# Patient Record
Sex: Female | Born: 1950 | Race: Black or African American | Hispanic: No | Marital: Married | Smoking: Former smoker
Health system: Southern US, Community
[De-identification: ages and names within clinical notes are randomized; demographics above are authoritative.]

## PROBLEM LIST (undated history)

## (undated) ENCOUNTER — Ambulatory Visit: Admission: EM

## (undated) DIAGNOSIS — J449 Chronic obstructive pulmonary disease, unspecified: Secondary | ICD-10-CM

## (undated) DIAGNOSIS — K219 Gastro-esophageal reflux disease without esophagitis: Secondary | ICD-10-CM

## (undated) DIAGNOSIS — I10 Essential (primary) hypertension: Secondary | ICD-10-CM

## (undated) DIAGNOSIS — E119 Type 2 diabetes mellitus without complications: Secondary | ICD-10-CM

## (undated) HISTORY — PX: ABDOMINAL HYSTERECTOMY: SHX81

## (undated) HISTORY — PX: APPENDECTOMY: SHX54

## (undated) HISTORY — PX: FOOT SURGERY: SHX648

## (undated) HISTORY — PX: CHOLECYSTECTOMY: SHX55

---

## 2006-04-12 ENCOUNTER — Ambulatory Visit: Payer: Self-pay | Admitting: Family Medicine

## 2006-07-31 ENCOUNTER — Ambulatory Visit: Payer: Self-pay | Admitting: Gastroenterology

## 2008-04-12 ENCOUNTER — Ambulatory Visit: Payer: Self-pay | Admitting: Family Medicine

## 2008-08-14 ENCOUNTER — Emergency Department: Payer: Self-pay | Admitting: Emergency Medicine

## 2008-08-29 ENCOUNTER — Emergency Department: Payer: Self-pay | Admitting: Emergency Medicine

## 2008-09-27 ENCOUNTER — Ambulatory Visit: Payer: Self-pay | Admitting: Family Medicine

## 2008-09-28 ENCOUNTER — Emergency Department: Payer: Self-pay | Admitting: Emergency Medicine

## 2008-12-13 ENCOUNTER — Ambulatory Visit: Payer: Self-pay | Admitting: Internal Medicine

## 2009-04-27 ENCOUNTER — Ambulatory Visit: Payer: Self-pay | Admitting: Internal Medicine

## 2009-10-03 ENCOUNTER — Ambulatory Visit: Payer: Self-pay | Admitting: Internal Medicine

## 2010-08-20 ENCOUNTER — Emergency Department: Payer: Self-pay | Admitting: Emergency Medicine

## 2010-12-09 ENCOUNTER — Ambulatory Visit: Payer: Self-pay | Admitting: Internal Medicine

## 2011-08-19 ENCOUNTER — Emergency Department: Payer: Self-pay | Admitting: *Deleted

## 2011-09-14 ENCOUNTER — Ambulatory Visit: Payer: Self-pay | Admitting: Internal Medicine

## 2012-06-02 ENCOUNTER — Ambulatory Visit: Payer: Self-pay | Admitting: Internal Medicine

## 2012-08-26 ENCOUNTER — Ambulatory Visit: Payer: Self-pay | Admitting: Medical

## 2012-09-17 ENCOUNTER — Ambulatory Visit: Payer: Self-pay | Admitting: Internal Medicine

## 2012-09-17 LAB — RAPID INFLUENZA A&B ANTIGENS

## 2013-02-28 ENCOUNTER — Emergency Department: Payer: Self-pay | Admitting: Emergency Medicine

## 2014-02-04 ENCOUNTER — Ambulatory Visit: Payer: Self-pay | Admitting: Physician Assistant

## 2014-03-09 ENCOUNTER — Ambulatory Visit: Payer: Self-pay | Admitting: Physician Assistant

## 2014-03-09 LAB — RAPID STREP-A WITH REFLX: MICRO TEXT REPORT: NEGATIVE

## 2014-03-12 LAB — BETA STREP CULTURE(ARMC)

## 2014-05-30 ENCOUNTER — Inpatient Hospital Stay: Payer: Self-pay | Admitting: Internal Medicine

## 2014-05-30 LAB — TROPONIN I: Troponin-I: <0.02 ng/mL

## 2014-05-30 LAB — CBC
HCT: 47.6 % — ABNORMAL HIGH (ref 35.0–47.0)
HGB: 15 g/dL (ref 12.0–16.0)
MCH: 29.1 pg (ref 26.0–34.0)
MCHC: 31.5 g/dL — ABNORMAL LOW (ref 32.0–36.0)
MCV: 93 fL (ref 80–100)
Platelet: 200 10*3/uL (ref 150–440)
RBC: 5.14 10*6/uL (ref 3.80–5.20)
RDW: 14.8 % — AB (ref 11.5–14.5)
WBC: 5.6 10*3/uL (ref 3.6–11.0)

## 2014-05-30 LAB — COMPREHENSIVE METABOLIC PANEL
ALBUMIN: 3.7 g/dL (ref 3.4–5.0)
ALK PHOS: 103 U/L
Anion Gap: 5 — ABNORMAL LOW (ref 7–16)
BUN: 10 mg/dL (ref 7–18)
Bilirubin,Total: 0.4 mg/dL (ref 0.2–1.0)
CALCIUM: 9 mg/dL (ref 8.5–10.1)
CHLORIDE: 109 mmol/L — AB (ref 98–107)
CO2: 27 mmol/L (ref 21–32)
Creatinine: 0.66 mg/dL (ref 0.60–1.30)
EGFR (Non-African Amer.): >60
Glucose: 92 mg/dL (ref 65–99)
Osmolality: 280 (ref 275–301)
Potassium: 4 mmol/L (ref 3.5–5.1)
SGOT(AST): 28 U/L (ref 15–37)
SGPT (ALT): 34 U/L
Sodium: 141 mmol/L (ref 136–145)
Total Protein: 7.4 g/dL (ref 6.4–8.2)

## 2014-05-31 LAB — CBC WITH DIFFERENTIAL/PLATELET
BASOS ABS: 0 10*3/uL (ref 0.0–0.1)
Basophil %: 0.2 %
Eosinophil #: 0 10*3/uL (ref 0.0–0.7)
Eosinophil %: 0 %
HCT: 48.5 % — ABNORMAL HIGH (ref 35.0–47.0)
HGB: 15.4 g/dL (ref 12.0–16.0)
LYMPHS PCT: 10.2 %
Lymphocyte #: 1 10*3/uL (ref 1.0–3.6)
MCH: 29.4 pg (ref 26.0–34.0)
MCHC: 31.8 g/dL — AB (ref 32.0–36.0)
MCV: 93 fL (ref 80–100)
MONOS PCT: 3.4 %
Monocyte #: 0.3 x10 3/mm (ref 0.2–0.9)
NEUTROS PCT: 86.2 %
Neutrophil #: 8.3 10*3/uL — ABNORMAL HIGH (ref 1.4–6.5)
Platelet: 215 10*3/uL (ref 150–440)
RBC: 5.24 10*6/uL — ABNORMAL HIGH (ref 3.80–5.20)
RDW: 14.8 % — ABNORMAL HIGH (ref 11.5–14.5)
WBC: 9.6 10*3/uL (ref 3.6–11.0)

## 2014-05-31 LAB — BASIC METABOLIC PANEL
Anion Gap: 6 — ABNORMAL LOW (ref 7–16)
BUN: 18 mg/dL (ref 7–18)
CALCIUM: 9.5 mg/dL (ref 8.5–10.1)
CREATININE: 0.7 mg/dL (ref 0.60–1.30)
Chloride: 110 mmol/L — ABNORMAL HIGH (ref 98–107)
Co2: 26 mmol/L (ref 21–32)
EGFR (Non-African Amer.): >60
GLUCOSE: 121 mg/dL — AB (ref 65–99)
Osmolality: 286 (ref 275–301)
POTASSIUM: 4.5 mmol/L (ref 3.5–5.1)
Sodium: 142 mmol/L (ref 136–145)

## 2014-11-11 ENCOUNTER — Ambulatory Visit: Payer: Self-pay | Admitting: Family Medicine

## 2014-11-22 ENCOUNTER — Ambulatory Visit: Payer: Self-pay | Admitting: Registered Nurse

## 2014-12-26 NOTE — H&P (Signed)
PATIENT NAME:  Katherine Carter, Katherine Carter MR#:  [id number] DATE OF BIRTH:  May 24, 1951  DATE OF ADMISSION:  05/30/2014  ADMITTING PHYSICIAN: Enid Baasadhika Bernadean Saling, MD.   PRIMARY CARE PHYSICIAN: Duanne Limerickeanna C. Jones, MD.   CHIEF COMPLAINT: Difficulty breathing.   HISTORY OF PRESENT ILLNESS: Katherine Carter is a 64 year old African American female with past medical history significant for COPD, not on any home oxygen, comes to the hospital secondary to worsening breathing problem for 5 days now. The patient states that all of her symptoms started about 5 days ago with difficulty breathing only on exertion. She waited for a couple of days to see if it would get any better, but it started getting worse. She could not take it anymore so this morning she came to the hospital. She was barely 90% on room air her sat and was put on 2 liters oxygen at this time and had significant expiratory wheezing, so he is being admitted for COPD exacerbation. Chest x-ray does not show any evidence of any bronchitis so was not started on antibiotics. She complains of cough which is mostly dry and has been worsening over the last couple of days.   PAST MEDICAL HISTORY: COPD, not on home oxygen.   PAST SURGICAL HISTORY: Cholecystectomy.   ALLERGIES TO MEDICATIONS: PENICILLIN.   CURRENT HOME MEDICATIONS:  1.  Xopenex inhaler 4 times a day as needed.  2.  Dulera 2 puffs b.i.d.   SOCIAL HISTORY: Lives at home with her husband. Quit smoking about a year ago, used to smoke about 1/2 pack per day. No alcohol abuse. Works [employer].   FAMILY HISTORY: Significant for COPD in the family.   REVIEW OF SYSTEMS:  CONSTITUTIONAL: No fever, fatigue, or weakness.  EYES: No blurred vision, double vision, distention or glaucoma.  ENT: No tinnitus, ear pain, hearing loss, epistaxis or discharge.  RESPIRATORY: Positive for cough, wheezing. No hemoptysis. Positive for COPD.  CARDIOVASCULAR: Positive for chest pain from coughing. No orthopnea,  edema, erythema, palpitations or syncope.  GASTROINTESTINAL: No nausea, vomiting, diarrhea, abdominal pain, hematemesis, or melena.  GENITOURINARY: No dysuria, hematuria, renal calculus, frequency, or incontinence.  ENDOCRINE: No polyuria, nocturia, thyroid problems, heat or cold intolerance.  HEMATOLOGY: No anemia, easy bruising or bleeding.  SKIN: No acne, rash or lesions.  LYMPHATICS: No cervical or inguinal lymphadenopathy.  NEUROLOGIC: Cranial nerves intact. No focal motor or sensory deficits.  PSYCHOLOGICAL: The patient is awake, alert, oriented x 3.   LABORATORY DATA: Temperature 97.8 degrees Fahrenheit, pulse 78, respirations 20, blood pressure 157/91, pulse oximetry 91% on room air.  GENERAL: Well-developed, well-nourished female sitting in bed, not in any acute distress.  HEENT: Normocephalic, atraumatic. Pupils equal, round, reacting to light. Anicteric sclerae. Extraocular movements intact. Oropharynx clear without erythema, mass or exudates.  NECK: Supple. No thyromegaly, JVD or carotid bruits. No lymphadenopathy. Normal range of motion without pain.  LUNGS: Moving air bilaterally. Significant expiratory coarse wheezes heard throughout the lungs anteriorly and posteriorly. No crackles.  No use of accessory muscles for breathing.  CARDIOVASCULAR: S1, S2, regular rate and rhythm. No murmurs, rubs, or gallops.  ABDOMEN: Soft, nontender, nondistended. No hepatosplenomegaly. Normal bowel sounds.  EXTREMITIES: No pedal edema. No clubbing or cyanosis, 2+ dorsalis pedis pulses palpable bilaterally.  SKIN: No acne, rash or lesions.  LYMPHATICS: No cervical or inguinal lymphadenopathy.  NEUROLOGIC: Cranial nerves intact. No focal motor or sensory deficits.  PSYCHOLOGICAL: The patient is awake, alert, oriented x 3.   LABORATORY DATA: WBC 5.6, hemoglobin  15.3, hematocrit 47.6, platelet count 200,000.   Sodium 141, potassium 4.0, chloride 109, bicarbonate 27, BUN 10, creatinine 0.66, glucose  92, and calcium of 9.0.   ALT 34, AST 28, alkaline phosphatase 103, total bilirubin 0.4, albumin of 3.7.   Chest x-ray showing chronic interstitial changes. No acute cardiopulmonary disease.   ASSESSMENT AND PLAN: A 64 year old female with past medical history of chronic obstructive pulmonary disease, not on home oxygen, being admitted for chronic obstructive pulmonary disease exacerbation.  1.  Acute chronic obstructive pulmonary disease exacerbation. On oxygen and IV Solu-Medrol. Nebulizers and inhalers are being continued. No evidence of bronchitis or pneumonia, but has exposure at [facility], so will start on azithromycin at this time.   2.  Deep vein thrombosis prophylaxis with subcutaneous heparin.   CODE STATUS: Full code.   TIME SPENT ON ADMISSION: 50 minutes.    ____________________________ Enid Baas, MD rk:at D: 05/30/2014 12:50:09 ET T: 05/30/2014 13:37:24 ET JOB#: [id number]  cc: Enid Baas, MD, <Dictator> Duanne Limerick, MD Enid Baas MD ELECTRONICALLY SIGNED 06/04/2014 9:50

## 2014-12-26 NOTE — Discharge Summary (Signed)
PATIENT NAME:  Katherine Carter, Katherine Carter MR#:  [id number] DATE OF BIRTH:  [birth year]  For detailed note, please look at the history and physical done on admission by Dr. Nemiah CommanderKalisetti.   DIAGNOSES AT DISCHARGE: As follows:  1.  Chronic obstructive pulmonary disease exacerbation secondary to acute bronchitis.  2.  Acute bronchitis.   DIET:  The patient is being discharged on a regular diet.   ACTIVITY: As tolerated.   FOLLOWUP: With Dr. Elizabeth Sauereanna Jones in the next 1-2 weeks  DISCHARGE MEDICATIONS: Albuterol nebulizer every 6 hours as needed, Dulera 2 puffs b.i.d., Xopenex nebulizer every 4 hours as needed, Robitussin 10 mL q. 4 hours as needed, Zithromax 250 mg daily x 4 days, oxygen 2 liters nasal cannula continuously, prednisone taper starting at 60 mg down to 10 mg over the next 6 days.   PERTINENT STUDIES DONE DURING THE HOSPITAL COURSE: Are as follows: A chest x-ray done on admission showing stable mild pulmonary interstitial prominence suggesting chronic interstitial lung disease; no acute cardiopulmonary disease.   HOSPITAL COURSE: This is a 64 year old female who presented to the hospital with shortness of breath and wheezing and noted to be in COPD exacerbation.  Problem #1.  COPD exacerbation. This was likely secondary to an acute bronchitis. The patient was admitted to the hospital, started on IV steroids, around-the-clock nebulizer treatments. Also maintained on her maintenance inhalers and Zithromax. After getting aggressive therapy, the patient's clinical symptoms have improved. She has less wheezing and bronchospasm. She was  ambulated on room air and did desaturate below 88%; therefore, was arranged for home oxygen prior to discharge. She is also being discharged on oral prednisone taper and Zithromax for treatment for underlying COPD with mild bronchitis.   Problem #2:  Acute bronchitis. This was likely the cause of the patient's COPD exacerbation. The patient was treated with Zithromax. He  is currently being discharged on oral Zithromax as stated.   CODE STATUS: The patient is a full code.   TIME SPENT ON DISCHARGE: Thirty-five minutes.   ____________________________ Rolly PancakeVivek J. Cherlynn KaiserSainani, MD vjs:LT D: 05/31/2014 12:26:00 ET T: 05/31/2014 16:52:28 ET JOB#: [id number]  cc: Rolly PancakeVivek J. Cherlynn KaiserSainani, MD, <Dictator> Houston SirenVIVEK J SAINANI MD ELECTRONICALLY SIGNED 06/01/2014 15:10

## 2015-07-07 ENCOUNTER — Ambulatory Visit
Admission: RE | Admit: 2015-07-07 | Discharge: 2015-07-07 | Disposition: A | Discharge status: Home / Self Care | Payer: Self-pay | Source: Ambulatory Visit | Attending: Oncology | Admitting: Oncology

## 2015-07-07 ENCOUNTER — Encounter: Payer: Self-pay | Admitting: *Deleted

## 2015-07-07 ENCOUNTER — Ambulatory Visit: Payer: Self-pay | Attending: Oncology | Admitting: *Deleted

## 2015-07-07 VITALS — BP 148/87 | HR 77 | Temp 98.1°F | Resp 18 | Ht 62.99 in | Wt 152.8 lb

## 2015-07-07 DIAGNOSIS — Z0000 Encounter for general adult medical examination without abnormal findings: Secondary | ICD-10-CM

## 2015-07-07 NOTE — Patient Instructions (Signed)
Gave patient hand-out, Women Staying Healthy, Active and Well from [facility], with education on breast health, pap smears, heart and colon health. 

## 2015-07-07 NOTE — Progress Notes (Signed)
Subjective:     Patient ID: Katherine Carter, female   DOB: 05-Apr-1951, 64 y.o.   MRN: [id number]  HPI   Review of Systems     Objective:   Physical Exam  Pulmonary/Chest: Right breast exhibits no inverted nipple, no mass, no nipple discharge, no skin change and no tenderness. Left breast exhibits no inverted nipple, no mass, no nipple discharge, no skin change and no tenderness. Breasts are symmetrical.         Assessment:     64 year old Black female [facility] breast [facility] and mammogram only.  Clinical breast exam unremarkable.  Taught self breast awareness.  Patient has been screened for eligibility.  She does not have any insurance, Medicare or Medicaid.  She also meets financial eligibility.  Hand-out given on the Affordable Care Act.     Plan:     Screening mammogram ordered.  Will follow up per [facility] protocol.

## 2015-07-28 ENCOUNTER — Encounter: Payer: Self-pay | Admitting: *Deleted

## 2015-07-28 NOTE — Progress Notes (Signed)
Letter mailed from the [facility] to inform patient of her normal mammogram results.  Patient is to follow-up with annual screening in one year.  HSIS to Christy. 

## 2015-12-07 ENCOUNTER — Ambulatory Visit
Admission: EM | Admit: 2015-12-07 | Discharge: 2015-12-07 | Disposition: A | Discharge status: Home / Self Care | Payer: BLUE CROSS/BLUE SHIELD | Attending: Family Medicine | Admitting: Family Medicine

## 2015-12-07 ENCOUNTER — Encounter: Payer: Self-pay | Admitting: Emergency Medicine

## 2015-12-07 DIAGNOSIS — J0100 Acute maxillary sinusitis, unspecified: Secondary | ICD-10-CM

## 2015-12-07 DIAGNOSIS — J209 Acute bronchitis, unspecified: Secondary | ICD-10-CM

## 2015-12-07 DIAGNOSIS — J441 Chronic obstructive pulmonary disease with (acute) exacerbation: Secondary | ICD-10-CM

## 2015-12-07 HISTORY — DX: Chronic obstructive pulmonary disease, unspecified: J44.9

## 2015-12-07 MED ORDER — FEXOFENADINE-PSEUDOEPHED ER 180-240 MG PO TB24: 1.0000 | ORAL_TABLET | Freq: Every day | ORAL | Status: DC

## 2015-12-07 MED ORDER — IPRATROPIUM-ALBUTEROL 0.5-2.5 (3) MG/3ML IN SOLN
3.0000 mL | Freq: Once | RESPIRATORY_TRACT | Status: AC
  Administered 2015-12-07: 3 mL via RESPIRATORY_TRACT

## 2015-12-07 MED ORDER — AZITHROMYCIN 250 MG PO TABS: ORAL_TABLET | ORAL | Status: DC

## 2015-12-07 MED ORDER — PREDNISONE 10 MG (21) PO TBPK: ORAL_TABLET | ORAL | Status: DC

## 2015-12-07 MED ORDER — HYDROCOD POLST-CPM POLST ER 10-8 MG/5ML PO SUER: 5.0000 mL | Freq: Two times a day (BID) | ORAL | Status: DC | PRN

## 2015-12-07 NOTE — ED Notes (Signed)
Less wheezing heard post breathing tx.

## 2015-12-07 NOTE — Discharge Instructions (Signed)
Acute Bronchitis Bronchitis is when the airways that extend from the windpipe into the lungs get red, puffy, and painful (inflamed). Bronchitis often causes thick spit (mucus) to develop. This leads to a cough. A cough is the most common symptom of bronchitis. In acute bronchitis, the condition usually begins suddenly and goes away over time (usually in 2 weeks). Smoking, allergies, and asthma can make bronchitis worse. Repeated episodes of bronchitis may cause more lung problems. HOME CARE  Rest.  Drink enough fluids to keep your pee (urine) clear or pale yellow (unless you need to limit fluids as told by your doctor).  Only take over-the-counter or prescription medicines as told by your doctor.  Avoid smoking and secondhand smoke. These can make bronchitis worse. If you are a smoker, think about using nicotine gum or skin patches. Quitting smoking will help your lungs heal faster.  Reduce the chance of getting bronchitis again by:  Washing your hands often.  Avoiding people with cold symptoms.  Trying not to touch your hands to your mouth, nose, or eyes.  Follow up with your doctor as told. GET HELP IF: Your symptoms do not improve after 1 week of treatment. Symptoms include:  Cough.  Fever.  Coughing up thick spit.  Body aches.  Chest congestion.  Chills.  Shortness of breath.  Sore throat. GET HELP RIGHT AWAY IF:   You have an increased fever.  You have chills.  You have severe shortness of breath.  You have bloody thick spit (sputum).  You throw up (vomit) often.  You lose too much body fluid (dehydration).  You have a severe headache.  You faint. MAKE SURE YOU:   Understand these instructions.  Will watch your condition.  Will get help right away if you are not doing well or get worse.   This information is not intended to replace advice given to you by your health care provider. Make sure you discuss any questions you have with your health care  provider.   Document Released: 02/07/2008 Document Revised: 04/23/2013 Document Reviewed: 02/11/2013 Elsevier Interactive Patient Education 2016 Else[employer] Inc[employer]  Asthma, Acute Bronchospasm Acute bronchospasm caused by asthma is also referred to as an asthma attack. Bronchospasm means your air passages become narrowed. The narrowing is caused by inflammation and tightening of the muscles in the air tubes (bronchi) in your lungs. This can make it hard to breathe or cause you to wheeze and cough. CAUSES Possible triggers are:  Animal dander from the skin, hair, or feathers of animals.  Dust mites contained in house dust.  Cockroaches.  Pollen from trees or grass.  Mold.  Cigarette or tobacco smoke.  Air pollutants such as dust, household cleaners, hair sprays, aerosol sprays, paint fumes, strong chemicals, or strong odors.  Cold air or weather changes. Cold air may trigger inflammation. Winds increase molds and pollens in the air.  Strong emotions such as crying or laughing hard.  Stress.  Certain medicines such as aspirin or beta-blockers.  Sulfites in foods and drinks, such as dried fruits and wine.  Infections or inflammatory conditions, such as a flu, cold, or inflammation of the nasal membranes (rhinitis).  Gastroesophageal reflux disease (GERD). GERD is a condition where stomach acid backs up into your esophagus.  Exercise or strenuous activity. SIGNS AND SYMPTOMS   Wheezing.  Excessive coughing, particularly at night.  Chest tightness.  Shortness of breath. DIAGNOSIS  Your health care provider will ask you about your medical history and perform a physical exam. A  chest X-ray or blood testing may be performed to look for other causes of your symptoms or other conditions that may have triggered your asthma attack. TREATMENT  Treatment is aimed at reducing inflammation and opening up the airways in your lungs. Most asthma attacks are treated with inhaled  medicines. These include quick relief or rescue medicines (such as bronchodilators) and controller medicines (such as inhaled corticosteroids). These medicines are sometimes given through an inhaler or a nebulizer. Systemic steroid medicine taken by mouth or given through an IV tube also can be used to reduce the inflammation when an attack is moderate or severe. Antibiotic medicines are only used if a bacterial infection is present.  HOME CARE INSTRUCTIONS   Rest.  Drink plenty of liquids. This helps the mucus to remain thin and be easily coughed up. Only use caffeine in moderation and do not use alcohol until you have recovered from your illness.  Do not smoke. Avoid being exposed to secondhand smoke.  You play a critical role in keeping yourself in good health. Avoid exposure to things that cause you to wheeze or to have breathing problems.  Keep your medicines up-to-date and available. Carefully follow your health care provider's treatment plan.  Take your medicine exactly as prescribed.  When pollen or pollution is bad, keep windows closed and use an air conditioner or go to places with air conditioning.  Asthma requires careful medical care. See your health care provider for a follow-up as advised. If you are more than [redacted] weeks pregnant and you were prescribed any new medicines, let your obstetrician know about the visit and how you are doing. Follow up with your health care provider as directed.  After you have recovered from your asthma attack, make an appointment with your outpatient doctor to talk about ways to reduce the likelihood of future attacks. If you do not have a doctor who manages your asthma, make an appointment with a primary care doctor to discuss your asthma. SEEK IMMEDIATE MEDICAL CARE IF:   You are getting worse.  You have trouble breathing. If severe, call your local emergency services (911 in the [place]  You develop chest pain or discomfort.  You are  vomiting.  You are not able to keep fluids down.  You are coughing up yellow, green, brown, or bloody sputum.  You have a fever and your symptoms suddenly get worse.  You have trouble swallowing. MAKE SURE YOU:   Understand these instructions.  Will watch your condition.  Will get help right away if you are not doing well or get worse.   This information is not intended to replace advice given to you by your health care provider. Make sure you discuss any questions you have with your health care provider.   Document Released: 12/06/2006 Document Revised: 08/26/2013 Document Reviewed: 02/26/2013 Elsevier Interactive Patient Education 2016 Else[employer] Inc.  Chronic Obstructive Pulmonary Disease Exacerbation Chronic obstructive pulmonary disease (COPD) is a common lung problem. In COPD, the flow of air from the lungs is limited. COPD exacerbations are times that breathing gets worse and you need extra treatment. Without treatment they can be life threatening. If they happen often, your lungs can become more damaged. If your COPD gets worse, your doctor may treat you with:  Medicines.  Oxygen.  Different ways to clear your airway, such as using a mask. HOME CARE  Do not smoke.  Avoid tobacco smoke and other things that bother your lungs.  If given, take your antibiotic medicine  as told. Finish the medicine even if you start to feel better.  Only take medicines as told by your doctor.  Drink enough fluids to keep your pee (urine) clear or pale yellow (unless your doctor has told you not to).  Use a cool mist machine (vaporizer).  If you use oxygen or a machine that turns liquid medicine into a mist (nebulizer), continue to use them as told.  Keep up with shots (vaccinations) as told by your doctor.  Exercise regularly.  Eat healthy foods.  Keep all doctor visits as told. GET HELP RIGHT AWAY IF:  You are very short of breath and it gets worse.  You have trouble  talking.  You have bad chest pain.  You have blood in your spit (sputum).  You have a fever.  You keep throwing up (vomiting).  You feel weak, or you pass out (faint).  You feel confused.  You keep getting worse. MAKE SURE YOU:  Understand these instructions.  Will watch your condition.  Will get help right away if you are not doing well or get worse.   This information is not intended to replace advice given to you by your health care provider. Make sure you discuss any questions you have with your health care provider.   Document Released: 08/10/2011 Document Revised: 09/11/2014 Document Reviewed: 04/25/2013 Elsevier Interactive Patient Education 2016 Elsevier Inc.  Sinusitis, Adult Sinusitis is redness, soreness, and puffiness (inflammation) of the air pockets in the bones of your face (sinuses). The redness, soreness, and puffiness can cause air and mucus to get trapped in your sinuses. This can allow germs to grow and cause an infection.  HOME CARE   Drink enough fluids to keep your pee (urine) clear or pale yellow.  Use a humidifier in your home.  Run a hot shower to create steam in the bathroom. Sit in the bathroom with the door closed. Breathe in the steam 3-4 times a day.  Put a warm, moist washcloth on your face 3-4 times a day, or as told by your doctor.  Use salt water sprays (saline sprays) to wet the thick fluid in your nose. This can help the sinuses drain.  Only take medicine as told by your doctor. GET HELP RIGHT AWAY IF:   Your pain gets worse.  You have very bad headaches.  You are sick to your stomach (nauseous).  You throw up (vomit).  You are very sleepy (drowsy) all the time.  Your face is puffy (swollen).  Your vision changes.  You have a stiff neck.  You have trouble breathing. MAKE SURE YOU:   Understand these instructions.  Will watch your condition.  Will get help right away if you are not doing well or get worse.    This information is not intended to replace advice given to you by your health care provider. Make sure you discuss any questions you have with your health care provider.   Document Released: 02/07/2008 Document Revised: 09/11/2014 Document Reviewed: 03/26/2012 Elsevier Interactive Patient Education 2016 Elsevier Inc.  Upper Respiratory Infection, Adult Most upper respiratory infections (URIs) are caused by a virus. A URI affects the nose, throat, and upper air passages. The most common type of URI is often called "the common cold." HOME CARE   Take medicines only as told by your doctor.  Gargle warm saltwater or take cough drops to comfort your throat as told by your doctor.  Use a warm mist humidifier or inhale steam from a shower  to increase air moisture. This may make it easier to breathe.  Drink enough fluid to keep your pee (urine) clear or pale yellow.  Eat soups and other clear broths.  Have a healthy diet.  Rest as needed.  Go back to work when your fever is gone or your doctor says it is okay.  You may need to stay home longer to avoid giving your URI to others.  You can also wear a face mask and wash your hands often to prevent spread of the virus.  Use your inhaler more if you have asthma.  Do not use any tobacco products, including cigarettes, chewing tobacco, or electronic cigarettes. If you need help quitting, ask your doctor. GET HELP IF:  You are getting worse, not better.  Your symptoms are not helped by medicine.  You have chills.  You are getting more short of breath.  You have brown or red mucus.  You have yellow or brown discharge from your nose.  You have pain in your face, especially when you bend forward.  You have a fever.  You have puffy (swollen) neck glands.  You have pain while swallowing.  You have white areas in the back of your throat. GET HELP RIGHT AWAY IF:   You have very bad or constant:  Headache.  Ear pain.  Pain  in your forehead, behind your eyes, and over your cheekbones (sinus pain).  Chest pain.  You have long-lasting (chronic) lung disease and any of the following:  Wheezing.  Long-lasting cough.  Coughing up blood.  A change in your usual mucus.  You have a stiff neck.  You have changes in your:  Vision.  Hearing.  Thinking.  Mood. MAKE SURE YOU:   Understand these instructions.  Will watch your condition.  Will get help right away if you are not doing well or get worse.   This information is not intended to replace advice given to you by your health care provider. Make sure you discuss any questions you have with your health care provider.   Document Released: 02/07/2008 Document Revised: 01/05/2015 Document Reviewed: 11/26/2013 Elsevier Interactive Patient Education [employer]

## 2015-12-07 NOTE — ED Notes (Signed)
Pt presents with cough and shortness of breath since Sunday along with some headaches.

## 2015-12-07 NOTE — ED Provider Notes (Signed)
CSN: [id number]     Arrival date & time 12/07/15  1305 History   First MD Initiated Contact with Patient 12/07/15 1457    Nurses notes were reviewed. Chief Complaint  Patient presents with  . Cough  . Shortness of Breath   Patient reports last 2 days to 3 days increased shortness of breath wheezing. She also reports nasal congestion. Does have a history of COPD stasis tried again to see her regular doctor today but they were booked and recommended she come to the urgent care.. She stopped smoking about 2 years ago said recurrent exacerbation of COPD and states that she was on prednisone about a month ago. This time she is having trouble for allergies and nasal congestion as well.   Past history she's had a abdominal hysterectomy she is a former smoker and she is on Atrovent. No significant past family medical history pertinent to this visit,   (Consider location/radiation/quality/duration/timing/severity/associated sxs/prior Treatment) Patient is a 65 y.o. female presenting with cough and shortness of breath. The history is provided by the patient. No language interpreter was used.  Cough Cough characteristics:  Non-productive Severity:  Moderate Onset quality:  Sudden Progression:  Worsening Context: upper respiratory infection   Relieved by:  Cough suppressants Worsened by:  Nothing tried Associated symptoms: rhinorrhea, shortness of breath, sinus congestion and wheezing   Rhinorrhea:    Severity:  Moderate   Timing:  Constant   Progression:  Worsening Shortness of Breath Associated symptoms: cough and wheezing     Past Medical History  Diagnosis Date  . COPD (chronic obstructive pulmonary disease) [facility])    Past Surgical History  Procedure Laterality Date  . Abdominal hysterectomy     No family history on file. Social History  Substance Use Topics  . Smoking status: Former Smoker -- 0.50 packs/day for 25 years    Types: Cigarettes    Quit date: 07/06/2014  . Smokeless  tobacco: Never Used  . Alcohol Use: No   OB History    No data available     Review of Systems  HENT: Positive for rhinorrhea.   Respiratory: Positive for cough, shortness of breath and wheezing.   All other systems reviewed and are negative.   Allergies  Penicillins  Home Medications   Prior to Admission medications   Medication Sig Start Date End Date Taking? Authorizing Provider  azithromycin (ZITHROMAX Z-PAK) 250 MG tablet Take 2 tablets first day and then 1 po a day for 4 days 12/07/15   Hassan Rowan, MD  chlorpheniramine-HYDROcodone [facility]) 10-8 MG/5ML SUER Take 5 mLs by mouth every 12 (twelve) hours as needed for cough. 12/07/15   Hassan Rowan, MD  fexofenadine-pseudoephedrine (ALLEGRA-D ALLERGY & CONGESTION) 180-240 MG 24 hr tablet Take 1 tablet by mouth daily. 12/07/15   Hassan Rowan, MD  predniSONE (STERAPRED UNI-PAK 21 TAB) 10 MG (21) TBPK tablet Sig 6 tablet day 1, 5 tablets day 2, 4 tablets day 3,,3tablets day 4, 2 tablets day 5, 1 tablet day 6 take all tablets orally 12/07/15   Hassan Rowan, MD   Meds Ordered and Administered this Visit   Medications  ipratropium-albuterol (DUONEB) 0.5-2.5 (3) MG/3ML nebulizer solution 3 mL (3 mLs Nebulization Given 12/07/15 1349)    BP 102/52 mmHg  Pulse 87  Temp(Src) 97.8 F (36.6 C) (Oral)  Resp 22  Ht  (1.6 m)  Wt 156 lb (70.761 kg)  BMI 27.64 kg/m2  SpO2 95% No data found.   Physical Exam  Constitutional: She appears well-developed and well-nourished.  HENT:  Head: Normocephalic and atraumatic.  Eyes: Conjunctivae are normal. Pupils are equal, round, and reactive to light.  Neck: Normal range of motion. Neck supple. No tracheal deviation present. No thyromegaly present.  Cardiovascular: Normal rate and normal heart sounds.   Pulmonary/Chest: Effort normal and breath sounds normal. No respiratory distress. She has no wheezes.  Musculoskeletal: Normal range of motion.  Neurological: She is alert.  Skin:  Skin is warm and dry.  Psychiatric: She has a normal mood and affect.  Vitals reviewed.   ED Course  Procedures (including critical care time)  Labs Review Labs Reviewed - No data to display  Imaging Review No results found.   Visual Acuity Review  Right Eye Distance:   Left Eye Distance:   Bilateral Distance:    Right Eye Near:   Left Eye Near:    Bilateral Near:         MDM   1. COPD with acute exacerbation (HCC)   2. Bronchitis, acute, with bronchospasm   3. Acute maxillary sinusitis, recurrence not specified    Patient was given DuoNeb treatment with improvement of her breathing. We'll place on Zithromax Z-Pak Allegra-D, Tussionex 1 teaspoon twice a day and Lopressor) a course of prednisone as well. Continue using inhaler as needed. Will give a work note for today and tomorrow as well. Follow-up PCP on Friday if not better please call Thursday to get a appointment. Return her if she becomes a lot worse.  Note: This dictation was prepared with Dragon dictation along with smaller phrase technology. Any transcriptional errors that result from this process are unintentional.    Hassan RowanEugene Tyechia Allmendinger, MD 12/07/15 1531

## 2015-12-10 ENCOUNTER — Encounter: Payer: Self-pay | Admitting: Emergency Medicine

## 2015-12-10 ENCOUNTER — Inpatient Hospital Stay
Admission: EM | Admit: 2015-12-10 | Discharge: 2015-12-13 | DRG: 190 | Disposition: A | Discharge status: Home / Self Care | Payer: BLUE CROSS/BLUE SHIELD | Attending: Internal Medicine | Admitting: Internal Medicine

## 2015-12-10 ENCOUNTER — Emergency Department: Payer: BLUE CROSS/BLUE SHIELD

## 2015-12-10 DIAGNOSIS — J9600 Acute respiratory failure, unspecified whether with hypoxia or hypercapnia: Secondary | ICD-10-CM | POA: Diagnosis present

## 2015-12-10 DIAGNOSIS — J189 Pneumonia, unspecified organism: Secondary | ICD-10-CM | POA: Diagnosis present

## 2015-12-10 DIAGNOSIS — R7301 Impaired fasting glucose: Secondary | ICD-10-CM | POA: Diagnosis present

## 2015-12-10 DIAGNOSIS — Z87891 Personal history of nicotine dependence: Secondary | ICD-10-CM

## 2015-12-10 DIAGNOSIS — Z79899 Other long term (current) drug therapy: Secondary | ICD-10-CM | POA: Diagnosis not present

## 2015-12-10 DIAGNOSIS — Z9889 Other specified postprocedural states: Secondary | ICD-10-CM | POA: Diagnosis not present

## 2015-12-10 DIAGNOSIS — J440 Chronic obstructive pulmonary disease with acute lower respiratory infection: Principal | ICD-10-CM | POA: Diagnosis present

## 2015-12-10 DIAGNOSIS — Z7982 Long term (current) use of aspirin: Secondary | ICD-10-CM

## 2015-12-10 DIAGNOSIS — Z90710 Acquired absence of both cervix and uterus: Secondary | ICD-10-CM | POA: Diagnosis not present

## 2015-12-10 DIAGNOSIS — Z8249 Family history of ischemic heart disease and other diseases of the circulatory system: Secondary | ICD-10-CM

## 2015-12-10 DIAGNOSIS — J9602 Acute respiratory failure with hypercapnia: Secondary | ICD-10-CM

## 2015-12-10 DIAGNOSIS — Z880 Allergy status to penicillin: Secondary | ICD-10-CM

## 2015-12-10 DIAGNOSIS — I10 Essential (primary) hypertension: Secondary | ICD-10-CM | POA: Diagnosis present

## 2015-12-10 DIAGNOSIS — J441 Chronic obstructive pulmonary disease with (acute) exacerbation: Secondary | ICD-10-CM | POA: Diagnosis present

## 2015-12-10 DIAGNOSIS — J9601 Acute respiratory failure with hypoxia: Secondary | ICD-10-CM | POA: Diagnosis present

## 2015-12-10 LAB — CBC WITH DIFFERENTIAL/PLATELET
BASOS ABS: 0 10*3/uL (ref 0–0.1)
Basophils Relative: 1 %
EOS PCT: 1 %
Eosinophils Absolute: 0 10*3/uL (ref 0–0.7)
HEMATOCRIT: 43.9 % (ref 35.0–47.0)
Hemoglobin: 14.5 g/dL (ref 12.0–16.0)
LYMPHS ABS: 1.4 10*3/uL (ref 1.0–3.6)
LYMPHS PCT: 20 %
MCH: 30.1 pg (ref 26.0–34.0)
MCHC: 33 g/dL (ref 32.0–36.0)
MCV: 91.3 fL (ref 80.0–100.0)
MONO ABS: 0.7 10*3/uL (ref 0.2–0.9)
MONOS PCT: 9 %
NEUTROS ABS: 5.2 10*3/uL (ref 1.4–6.5)
Neutrophils Relative %: 69 %
PLATELETS: 165 10*3/uL (ref 150–440)
RBC: 4.81 MIL/uL (ref 3.80–5.20)
RDW: 14.4 % (ref 11.5–14.5)
WBC: 7.4 10*3/uL (ref 3.6–11.0)

## 2015-12-10 LAB — COMPREHENSIVE METABOLIC PANEL
ALT: 118 U/L — ABNORMAL HIGH (ref 14–54)
AST: 94 U/L — AB (ref 15–41)
Albumin: 3.7 g/dL (ref 3.5–5.0)
Alkaline Phosphatase: 89 U/L (ref 38–126)
Anion gap: 6 (ref 5–15)
BILIRUBIN TOTAL: 0.5 mg/dL (ref 0.3–1.2)
BUN: 12 mg/dL (ref 6–20)
CHLORIDE: 101 mmol/L (ref 101–111)
CO2: 29 mmol/L (ref 22–32)
Calcium: 8.6 mg/dL — ABNORMAL LOW (ref 8.9–10.3)
Creatinine, Ser: 0.55 mg/dL (ref 0.44–1.00)
Glucose, Bld: 127 mg/dL — ABNORMAL HIGH (ref 65–99)
POTASSIUM: 4.1 mmol/L (ref 3.5–5.1)
Sodium: 136 mmol/L (ref 135–145)
TOTAL PROTEIN: 7.3 g/dL (ref 6.5–8.1)

## 2015-12-10 LAB — BRAIN NATRIURETIC PEPTIDE: B NATRIURETIC PEPTIDE 5: 22 pg/mL (ref 0.0–100.0)

## 2015-12-10 LAB — HEMOGLOBIN A1C: HEMOGLOBIN A1C: 6.4 % — AB (ref 4.0–6.0)

## 2015-12-10 MED ORDER — ENOXAPARIN SODIUM 40 MG/0.4ML ~~LOC~~ SOLN: 40.0000 mg | SUBCUTANEOUS | Status: DC

## 2015-12-10 MED ORDER — LORATADINE 10 MG PO TABS
10.0000 mg | ORAL_TABLET | Freq: Every day | ORAL | Status: DC
  Administered 2015-12-10 – 2015-12-13 (×4): 10 mg via ORAL
  Filled 2015-12-10 (×4): qty 1

## 2015-12-10 MED ORDER — IPRATROPIUM-ALBUTEROL 0.5-2.5 (3) MG/3ML IN SOLN
3.0000 mL | Freq: Once | RESPIRATORY_TRACT | Status: AC
  Administered 2015-12-10: 3 mL via RESPIRATORY_TRACT

## 2015-12-10 MED ORDER — IPRATROPIUM-ALBUTEROL 0.5-2.5 (3) MG/3ML IN SOLN
3.0000 mL | Freq: Once | RESPIRATORY_TRACT | Status: AC
  Administered 2015-12-10: 3 mL via RESPIRATORY_TRACT
  Filled 2015-12-10: qty 3

## 2015-12-10 MED ORDER — ACETAMINOPHEN 650 MG RE SUPP: 650.0000 mg | Freq: Four times a day (QID) | RECTAL | Status: DC | PRN

## 2015-12-10 MED ORDER — ASPIRIN EC 81 MG PO TBEC
81.0000 mg | DELAYED_RELEASE_TABLET | Freq: Every day | ORAL | Status: DC
  Administered 2015-12-10 – 2015-12-13 (×4): 81 mg via ORAL
  Filled 2015-12-10 (×4): qty 1

## 2015-12-10 MED ORDER — ACETAMINOPHEN 500 MG PO TABS
ORAL_TABLET | ORAL | Status: AC
  Administered 2015-12-10: 1000 mg via ORAL
  Filled 2015-12-10: qty 2

## 2015-12-10 MED ORDER — ONDANSETRON HCL 4 MG PO TABS: 4.0000 mg | ORAL_TABLET | Freq: Four times a day (QID) | ORAL | Status: DC | PRN

## 2015-12-10 MED ORDER — LEVOFLOXACIN IN D5W 750 MG/150ML IV SOLN
750.0000 mg | INTRAVENOUS | Status: DC
  Administered 2015-12-10 – 2015-12-12 (×3): 750 mg via INTRAVENOUS
  Filled 2015-12-10 (×3): qty 150

## 2015-12-10 MED ORDER — HYDROCOD POLST-CPM POLST ER 10-8 MG/5ML PO SUER: 5.0000 mL | Freq: Two times a day (BID) | ORAL | Status: DC | PRN

## 2015-12-10 MED ORDER — LEVALBUTEROL HCL 1.25 MG/0.5ML IN NEBU
1.2500 mg | INHALATION_SOLUTION | Freq: Four times a day (QID) | RESPIRATORY_TRACT | Status: DC
  Administered 2015-12-11 – 2015-12-13 (×10): 1.25 mg via RESPIRATORY_TRACT
  Filled 2015-12-10 (×10): qty 0.5

## 2015-12-10 MED ORDER — LEVALBUTEROL HCL 1.25 MG/0.5ML IN NEBU
1.2500 mg | INHALATION_SOLUTION | Freq: Four times a day (QID) | RESPIRATORY_TRACT | Status: DC
  Administered 2015-12-10: 1.25 mg via RESPIRATORY_TRACT
  Filled 2015-12-10: qty 0.5

## 2015-12-10 MED ORDER — ACETAMINOPHEN 325 MG PO TABS
650.0000 mg | ORAL_TABLET | Freq: Four times a day (QID) | ORAL | Status: DC | PRN
  Administered 2015-12-10 – 2015-12-12 (×3): 650 mg via ORAL
  Filled 2015-12-10 (×3): qty 2

## 2015-12-10 MED ORDER — DICLOFENAC SODIUM 25 MG PO TBEC
75.0000 mg | DELAYED_RELEASE_TABLET | Freq: Two times a day (BID) | ORAL | Status: DC | PRN
  Administered 2015-12-10: 75 mg via ORAL
  Filled 2015-12-10: qty 3
  Filled 2015-12-10: qty 1

## 2015-12-10 MED ORDER — METFORMIN HCL ER 500 MG PO TB24
500.0000 mg | ORAL_TABLET | Freq: Every day | ORAL | Status: DC
  Administered 2015-12-11 – 2015-12-13 (×3): 500 mg via ORAL
  Filled 2015-12-10 (×4): qty 1

## 2015-12-10 MED ORDER — LISINOPRIL 10 MG PO TABS
10.0000 mg | ORAL_TABLET | Freq: Every day | ORAL | Status: DC
  Administered 2015-12-10 – 2015-12-11 (×2): 10 mg via ORAL
  Filled 2015-12-10 (×2): qty 1

## 2015-12-10 MED ORDER — METHYLPREDNISOLONE SODIUM SUCC 125 MG IJ SOLR
60.0000 mg | Freq: Four times a day (QID) | INTRAMUSCULAR | Status: DC
  Administered 2015-12-10 – 2015-12-13 (×13): 60 mg via INTRAVENOUS
  Filled 2015-12-10 (×13): qty 2

## 2015-12-10 MED ORDER — IPRATROPIUM-ALBUTEROL 0.5-2.5 (3) MG/3ML IN SOLN
RESPIRATORY_TRACT | Status: AC
  Administered 2015-12-10: 3 mL via RESPIRATORY_TRACT
  Filled 2015-12-10: qty 3

## 2015-12-10 MED ORDER — BUDESONIDE 0.25 MG/2ML IN SUSP
0.2500 mg | Freq: Two times a day (BID) | RESPIRATORY_TRACT | Status: DC
  Administered 2015-12-10 – 2015-12-13 (×7): 0.25 mg via RESPIRATORY_TRACT
  Filled 2015-12-10 (×7): qty 2

## 2015-12-10 MED ORDER — ONDANSETRON HCL 4 MG/2ML IJ SOLN: 4.0000 mg | Freq: Four times a day (QID) | INTRAMUSCULAR | Status: DC | PRN

## 2015-12-10 MED ORDER — ACETAMINOPHEN 500 MG PO TABS
1000.0000 mg | ORAL_TABLET | Freq: Once | ORAL | Status: AC
  Administered 2015-12-10: 1000 mg via ORAL

## 2015-12-10 NOTE — ED Provider Notes (Signed)
Time Seen: Approximately 1040  I have reviewed the triage notes  Chief Complaint: Shortness of Breath   History of Present Illness: Ceriah Carter is a 65 y.o. female who arrives with respiratory distress. Patient was placed on BiPAP per EMS for low saturations at home of 64% on room air. Patient does have home oxygen that she uses only on an as-needed basis. Patient apparently is been struggling with some upper respiratory symptoms and a cough and was seen by her primary physician who gave her prescription for antibiotics and some cough medication. She states that her symptoms started approximately 5 days ago and got worse over the last 48 hours. Needs 2 DuoNeb treatments and 2 g of magnesium, and 125 mg of Solu-Medrol prior to arrival. States she feels symptomatic improvement but still short of breath. She denies any chest pain, leg pain or swelling though states that she's been having some discomfort in her hips mainly with coughing.   Past Medical History  Diagnosis Date  . COPD (chronic obstructive pulmonary disease) [facility])     Patient Active Problem List   Diagnosis Date Noted  . Acute respiratory failure (HCC) 12/10/2015    Past Surgical History  Procedure Laterality Date  . Abdominal hysterectomy    . Foot surgery      Past Surgical History  Procedure Laterality Date  . Abdominal hysterectomy    . Foot surgery      Current Outpatient Rx  Name  Route  Sig  Dispense  Refill  . aspirin EC 81 MG tablet   Oral   Take 81 mg by mouth daily.         Marland Kitchen azithromycin (ZITHROMAX Z-PAK) 250 MG tablet      Take 2 tablets first day and then 1 po a day for 4 days   6 tablet   0   . chlorpheniramine-HYDROcodone (TUSSIONEX PENNKINETIC ER) 10-8 MG/5ML SUER   Oral   Take 5 mLs by mouth every 12 (twelve) hours as needed for cough.   115 mL   0   . diclofenac (VOLTAREN) 75 MG EC tablet   Oral   Take 75 mg by mouth 2 (two) times daily as needed.         .  fexofenadine-pseudoephedrine (ALLEGRA-D ALLERGY & CONGESTION) 180-240 MG 24 hr tablet   Oral   Take 1 tablet by mouth daily.   30 tablet   0   . levalbuterol (XOPENEX HFA) 45 MCG/ACT inhaler   Inhalation   Inhale 2 puffs into the lungs every 4 (four) hours as needed for wheezing.         Marland Kitchen lisinopril (PRINIVIL,ZESTRIL) 10 MG tablet   Oral   Take 10 mg by mouth daily.         . metFORMIN (GLUCOPHAGE-XR) 500 MG 24 hr tablet   Oral   Take 500 mg by mouth daily with breakfast.         . mometasone-formoterol (DULERA) 100-5 MCG/ACT AERO   Inhalation   Inhale 1 puff into the lungs 2 (two) times daily.         . predniSONE (DELTASONE) 10 MG tablet   Oral   Take 10-60 mg by mouth daily with breakfast. Taper dose 6-5-4-3-2-1           Allergies:  Penicillins  Family History: Family History  Problem Relation Age of Onset  . CAD Mother   . CAD Father     Social History: Social History  Substance Use Topics  . Smoking status: Former Smoker -- 0.50 packs/day for 25 years    Types: Cigarettes    Quit date: 07/06/2014  . Smokeless tobacco: Never Used  . Alcohol Use: No     Review of Systems:   10 point review of systems was performed and was otherwise negative:  Constitutional: NoObjective fever Eyes: No visual disturbances ENT: No sore throat, ear pain Cardiac: No chest pain Respiratory: Shortness of breath described above Abdomen: No abdominal pain, no vomiting, No diarrhea Endocrine: No weight loss, No night sweats Extremities: No peripheral edema, cyanosis Skin: No rashes, easy bruising Neurologic: No focal weakness, trouble with speech or swollowing Urologic: No dysuria, Hematuria, or urinary frequency   Physical Exam:  ED Triage Vitals  Enc Vitals Group     BP 12/10/15 1115 128/112 mmHg     Pulse Rate 12/10/15 1041 108     Resp 12/10/15 1044 20     Temp 12/10/15 1044 98.2 F (36.8 C)     Temp Source 12/10/15 1044 Oral     SpO2 12/10/15 1041  93 %     Weight 12/10/15 1041 156 lb (70.761 kg)     Height 12/10/15 1041  (1.6 m)     Head Cir --      Peak Flow --      Pain Score 12/10/15 1042 9     Pain Loc --      Pain Edu? --      Excl. in GC? --     General: Awake , Alert , and Oriented times 3; GCS 15Patient with signs of respiratory distress with speaking in brief sentences with some upper respiratory retractions. Currently on BiPAP. Head: Normal cephalic , atraumatic Eyes: Pupils equal , round, reactive to light Nose/Throat: No nasal drainage, patent upper airway without erythema or exudate.  Neck: Supple, Full range of motion, No anterior adenopathy or palpable thyroid masses Lungs: Minimal air movement at the bases with some mild and expiratory rhonchi and wheezing at the bilateral apices Heart: Regular rate, regular rhythm without murmurs , gallops , or rubs Abdomen: Soft, non tender without rebound, guarding , or rigidity; bowel sounds positive and symmetric in all 4 quadrants. No organomegaly .        Extremities: 2 plus symmetric pulses. No edema, clubbing or cyanosis Neurologic: normal ambulation, Motor symmetric without deficits, sensory intact Skin: warm, dry, no rashes   Labs:   All laboratory work was reviewed including any pertinent negatives or positives listed below:  Labs Reviewed  COMPREHENSIVE METABOLIC PANEL - Abnormal; Notable for the following:    Glucose, Bld 127 (*)    Calcium 8.6 (*)    AST 94 (*)    ALT 118 (*)    All other components within normal limits  CBC WITH DIFFERENTIAL/PLATELET  BRAIN NATRIURETIC PEPTIDE  Laboratory work was reviewed and showed no clinically significant abnormalities.   EKG:   ED ECG REPORT I, Jennye Moccasin, the attending physician, personally viewed and interpreted this ECG.  Date: 12/10/2015 EKG Time: 1046 Rate: 109 Rhythm: Sinus tachycardia QRS Axis: normal Intervals: normal ST/T Wave abnormalities: normal Conduction Disturbances: none Narrative  Interpretation: unremarkable No acute ischemic changes  Radiology:    EXAM: PORTABLE CHEST 1 VIEW  COMPARISON: 11/22/2014 and earlier.  FINDINGS: Portable AP upright view at 1051 hours. Stable lung volumes. Normal cardiac size and mediastinal contours. Calcified aortic atherosclerosis. Attenuation of upper lobe bronchovascular markings again suggestive of emphysema. No pneumothorax  chronic but mildly increased basilar predominant interstitial markings. No pleural effusion or consolidation.  IMPRESSION: Acute on chronic lung base interstitial opacity suspicious for acute infectious exacerbation in this setting. No pleural effusion. Upper lobe emphysema suspected.   I personally reviewed the radiologic studies   Critical Care: * CRITICAL CARE Performed by: Jennye MoccasinBrian S Arnette Driggs   Total critical care time: 33 minutes  Critical care time was exclusive of separately billable procedures and treating other patients.  Critical care was necessary to treat or prevent imminent or life-threatening deterioration.  Critical care was time spent personally by me on the following activities: development of treatment plan with patient and/or surrogate as well as nursing, discussions with consultants, evaluation of patient's response to treatment, examination of patient, obtaining history from patient or surrogate, ordering and performing treatments and interventions, ordering and review of laboratory studies, ordering and review of radiographic studies, pulse oximetry and re-evaluation of patient's condition. Evaluation and treatment of respiratory distress    ED Course: Patient received 2 more additional duo nebs and we are able to remove the BiPAP and place her on 6 L nasal cannula. Patient appears to have signs of community-acquired pneumonia and was started on IV antibiotic therapy with Levaquin. He otherwise appears to be hemodynamically stable and able to maintain pulse ox is of 98% at  rest on a 6 L nasal cannula. She otherwise appears to be hemodynamically stable and I don't suspect sepsis at this time.   Assessment:  Community-acquired pneumonia Acute exacerbation of chronic obstructive pulmonary disease   Final Clinical Impression:   Final diagnoses:  Chronic obstructive pulmonary disease with acute exacerbation [facility](HCC)     Plan:  Inpatient management            Jennye MoccasinBrian S Nyla Creason, MD 12/10/15 1300

## 2015-12-10 NOTE — ED Notes (Addendum)
Pt unable to use bedpan. Pt able to stand and pivit to toilet though and is now on toilet in room. No acute distress noted while standing but pt is does report increased SOB.

## 2015-12-10 NOTE — ED Notes (Signed)
[facility] EMS reports patient on her way to MD office when they pulled over and called 911 because patient was having trouble breathing. Sats on EMS arrival were 64% on room air. Patient improved to 94% on CPAP. Patient given 2 douneb treatments, 2 Grams of Mag, and 125 mg of Solumedrol

## 2015-12-10 NOTE — ED Notes (Addendum)
Pt reports she does feel like she is having increased WOB with [place]. Pt reports still having a headache at this time and also verbalized wanting something to eat. MD made aware.

## 2015-12-10 NOTE — ED Notes (Signed)
Pt placed on bedpan in attempt to urinate.

## 2015-12-10 NOTE — H&P (Addendum)
[facility] [place] [facility]   PATIENT NAME: Katherine Carter    MR#:  [id number]  DATE OF BIRTH:  11/14/1950  DATE OF ADMISSION:  12/10/2015  PRIMARY CARE PHYSICIAN: [facility]   REQUESTING/REFERRING PHYSICIAN: Dr. Lacretia Nicks  CHIEF COMPLAINT:   Chief Complaint  Patient presents with  . Shortness of Breath    HISTORY OF PRESENT ILLNESS:  Katherine Carter  is a 65 y.o. female with a known history of COPD. The patient only wears oxygen as needed at home. She states that over the last few days she has not been feeling well. She saw her doctor on Tuesday and was given Tussionex Zithromax and prednisone. She has been coughing, she has been short of breath, she's been wheezing. Family states that she possibly had a fever last night she was warm. Also has chills and sweats. She's been feeling very fatigued and no energy.  PAST MEDICAL HISTORY:   Past Medical History  Diagnosis Date  . COPD (chronic obstructive pulmonary disease) (HCC)     PAST SURGICAL HISTORY:   Past Surgical History  Procedure Laterality Date  . Abdominal hysterectomy    . Foot surgery      SOCIAL HISTORY:   Social History  Substance Use Topics  . Smoking status: Former Smoker -- 0.50 packs/day for 25 years    Types: Cigarettes    Quit date: 07/06/2014  . Smokeless tobacco: Never Used  . Alcohol Use: No    FAMILY HISTORY:   Family History  Problem Relation Age of Onset  . CAD Mother   . CAD Father     DRUG ALLERGIES:   Allergies  Allergen Reactions  . Penicillins Hives    REVIEW OF SYSTEMS:  CONSTITUTIONAL: Positive for fever, chills and sweats. Positive for fatigue.  EYES: No blurred or double vision. Wears glasses EARS, NOSE, AND THROAT: No tinnitus or ear pain. Positive for sore throat. RESPIRATORY: Positive for cough and shortness of breath. Positive for wheezing. No hemoptysis.  CARDIOVASCULAR: No chest pain, orthopnea, edema.   GASTROINTESTINAL: No nausea, vomiting, diarrhea or abdominal pain. No blood in bowel movements GENITOURINARY: No dysuria, hematuria.  ENDOCRINE: No polyuria, nocturia,  HEMATOLOGY: No anemia, easy bruising or bleeding SKIN: No rash or lesion. MUSCULOSKELETAL: Positive for hip pain, arm and leg pain.   NEUROLOGIC: No tingling, numbness, weakness.  PSYCHIATRY: No anxiety or depression.   MEDICATIONS AT HOME:   Prior to Admission medications   Medication Sig Start Date End Date Taking? Authorizing Provider  aspirin EC 81 MG tablet Take 81 mg by mouth daily.   Yes Historical Provider, MD  azithromycin (ZITHROMAX Z-PAK) 250 MG tablet Take 2 tablets first day and then 1 po a day for 4 days 12/07/15  Yes Hassan Rowan, MD  chlorpheniramine-HYDROcodone [facility] PENNKINETIC ER) 10-8 MG/5ML SUER Take 5 mLs by mouth every 12 (twelve) hours as needed for cough. 12/07/15  Yes Hassan Rowan, MD  diclofenac (VOLTAREN) 75 MG EC tablet Take 75 mg by mouth 2 (two) times daily as needed.   Yes Historical Provider, MD  fexofenadine-pseudoephedrine (ALLEGRA-D ALLERGY & CONGESTION) 180-240 MG 24 hr tablet Take 1 tablet by mouth daily. 12/07/15  Yes Hassan Rowan, MD  levalbuterol [facility] HFA) 45 MCG/ACT inhaler Inhale 2 puffs into the lungs every 4 (four) hours as needed for wheezing.   Yes Historical Provider, MD  lisinopril (PRINIVIL,ZESTRIL) 10 MG tablet Take 10 mg by mouth daily.   Yes Historical Provider, MD  metFORMIN (  GLUCOPHAGE-XR) 500 MG 24 hr tablet Take 500 mg by mouth daily with breakfast.   Yes Historical Provider, MD  mometasone-formoterol (DULERA) 100-5 MCG/ACT AERO Inhale 1 puff into the lungs 2 (two) times daily.   Yes Historical Provider, MD  predniSONE (DELTASONE) 10 MG tablet Take 10-60 mg by mouth daily with breakfast. Taper dose 6-5-4-3-2-1 12/07/15 12/12/15 Yes Historical Provider, MD      VITAL SIGNS:  Blood pressure 159/80, pulse 93, temperature 98.2 F (36.8 C), temperature source Oral, resp.  rate 18, height  (1.6 m), weight 70.761 kg (156 lb), SpO2 97 %.  PHYSICAL EXAMINATION:  GENERAL:  65 y.o.-year-old patient sitting in bed, now breathing more comfortably than she was on presentation wherein she required BiPAP.Marland Kitchen  EYES: Pupils equal, round, reactive to light and accommodation. No scleral icterus. Extraocular muscles intact.  HEENT: Head atraumatic, normocephalic. Oropharynx and nasopharynx clear.  NECK:  Supple, no jugular venous distention. No thyroid enlargement, no tenderness.  LUNGS: Decreased breath sounds bilaterally, poor air entry bilaterally. Expiratory wheezing, no rales,rhonchi or crepitation. Positive use of accessory muscles of respiration.  CARDIOVASCULAR: S1, S2 normal. No murmurs, rubs, or gallops.  ABDOMEN: Soft, nontender, nondistended. Bowel sounds present. No organomegaly or mass.  EXTREMITIES: No pedal edema, cyanosis, or clubbing.  NEUROLOGIC: Cranial nerves II through XII are intact. Muscle strength 5/5 in all extremities. Sensation intact. Gait not checked.  PSYCHIATRIC: The patient is alert and oriented x 3.  SKIN: No rash, lesion, or ulcer.   LABORATORY PANEL:   CBC  Recent Labs Lab 12/10/15 1044  WBC 7.4  HGB 14.5  HCT 43.9  PLT 165   ------------------------------------------------------------------------------------------------------------------  Chemistries   Recent Labs Lab 12/10/15 1044  NA 136  K 4.1  CL 101  CO2 29  GLUCOSE 127*  BUN 12  CREATININE 0.55  CALCIUM 8.6*  AST 94*  ALT 118*  ALKPHOS 89  BILITOT 0.5   ------------------------------------------------------------------------------------------------------------------   RADIOLOGY:  Dg Chest Port 1 View  12/10/2015  CLINICAL DATA:  65 year old female with 3 days of increased wheezing, shortness of breath, congestion. Initial encounter. EXAM: PORTABLE CHEST 1 VIEW COMPARISON:  11/22/2014 and earlier. FINDINGS: Portable AP upright view at 1051 hours. Stable  lung volumes. Normal cardiac size and mediastinal contours. Calcified aortic atherosclerosis. Attenuation of upper lobe bronchovascular markings again suggestive of emphysema. No pneumothorax chronic but mildly increased basilar predominant interstitial markings. No pleural effusion or consolidation. IMPRESSION: Acute on chronic lung base interstitial opacity suspicious for acute infectious exacerbation in this setting. No pleural effusion. Upper lobe emphysema suspected. Electronically Signed   By: Odessa Fleming M.D.   On: 12/10/2015 11:07    EKG:   Sinus tachycardia, biatrial enlargement  IMPRESSION AND PLAN:   1. Acute respiratory distress. Requiring BiPAP on initial presentation to the hospital to move air. Continue oxygen supplementation. Patient now off BiPAP on nasal cannula. Patient sees Dr. Meredeth Ide as outpatient. 2. COPD exacerbation. High-dose Solu-Medrol, Xopenex nebulizer and budesonide nebulizers. Levaquin antibiotic. 3. Bibasilar pneumonia. Levaquin will cover 4. Impaired fasting glucose. Check hemoglobin A1c. Patient on Glucophage. 5. Essential hypertension on lisinopril  All the records are reviewed and case discussed with ED provider. Management plans discussed with the patient, family and they are in agreement.  CODE STATUS: Full code  TOTAL TIME TAKING CARE OF THIS PATIENT: 50 minutes, Patient critically ill but able to come off bipap. I will try and treat on the floor at this time.   Alford Highland M.D on 12/10/2015  at 12:55 PM  Between 7am to 6pm - Pager - [phone]  After 6pm call admission pager [phone]  [facility] Office  [phone]  CC: Primary care physician; [facility]

## 2015-12-10 NOTE — Progress Notes (Signed)
Pharmacy Antibiotic Note  Katherine Carter is a 65 y.o. female admitted on 12/10/2015 with pneumonia.  Pharmacy has been consulted for Levaquin dosing.  Plan: Levaquin 750mg  IV q24h  Height: 5\' 3"  (160 cm) Weight: 156 lb (70.761 kg) IBW/kg (Calculated) : 52.4  Temp (24hrs), Avg:98.1 F (36.7 C), Min:98 F (36.7 C), Max:98.2 F (36.8 C)   Recent Labs Lab 12/10/15 1044  WBC 7.4  CREATININE 0.55    Estimated Creatinine Clearance: 67.1 mL/min (by C-G formula based on Cr of 0.55).    Allergies  Allergen Reactions  . Penicillins Hives    Antimicrobials this admission: Levaquin 4/7 >>   Dose adjustments this admission:  Microbiology results:   Thank you for allowing pharmacy to be a part of this patient's care.  Clovia CuffLisa Jackqulyn Mendel, PharmD, BCPS 12/10/2015 4:20 PM

## 2015-12-10 NOTE — ED Notes (Signed)
hospitalist at bedside with pt and pts family.

## 2015-12-11 LAB — BASIC METABOLIC PANEL
ANION GAP: 5 (ref 5–15)
BUN: 21 mg/dL — ABNORMAL HIGH (ref 6–20)
CALCIUM: 8.9 mg/dL (ref 8.9–10.3)
CO2: 31 mmol/L (ref 22–32)
Chloride: 102 mmol/L (ref 101–111)
Creatinine, Ser: 0.57 mg/dL (ref 0.44–1.00)
Glucose, Bld: 171 mg/dL — ABNORMAL HIGH (ref 65–99)
Potassium: 4.4 mmol/L (ref 3.5–5.1)
Sodium: 138 mmol/L (ref 135–145)

## 2015-12-11 LAB — CBC
HCT: 41.6 % (ref 35.0–47.0)
HEMOGLOBIN: 13.7 g/dL (ref 12.0–16.0)
MCH: 30.1 pg (ref 26.0–34.0)
MCHC: 33 g/dL (ref 32.0–36.0)
MCV: 91.1 fL (ref 80.0–100.0)
Platelets: 184 10*3/uL (ref 150–440)
RBC: 4.56 MIL/uL (ref 3.80–5.20)
RDW: 14.3 % (ref 11.5–14.5)
WBC: 4.4 10*3/uL (ref 3.6–11.0)

## 2015-12-11 MED ORDER — LISINOPRIL 20 MG PO TABS
20.0000 mg | ORAL_TABLET | Freq: Once | ORAL | Status: AC
  Administered 2015-12-11: 20 mg via ORAL
  Filled 2015-12-11: qty 1

## 2015-12-11 MED ORDER — LISINOPRIL 20 MG PO TABS
40.0000 mg | ORAL_TABLET | Freq: Every day | ORAL | Status: DC
  Administered 2015-12-12 – 2015-12-13 (×2): 40 mg via ORAL
  Filled 2015-12-11 (×2): qty 2

## 2015-12-11 NOTE — Progress Notes (Signed)
[facility] - [place] at [facility]   PATIENT NAME: Katherine Carter    MR#:  [id number]  DATE OF BIRTH:  02/10/1951  SUBJECTIVE:  CHIEF COMPLAINT:  Patient is feeling better. Shortness of breath is better. Minimal wheezing still. Uses oxygen 2 L via nasal cannula as needed. Not smoking anymore.  REVIEW OF SYSTEMS:  CONSTITUTIONAL: No fever, fatigue or weakness.  EYES: No blurred or double vision.  EARS, NOSE, AND THROAT: No tinnitus or ear pain.  RESPIRATORY: Reporting cough, denies shortness of breath, still has wheezing denies hemoptysis.  CARDIOVASCULAR: No chest pain, orthopnea, edema.  GASTROINTESTINAL: No nausea, vomiting, diarrhea or abdominal pain.  GENITOURINARY: No dysuria, hematuria.  ENDOCRINE: No polyuria, nocturia,  HEMATOLOGY: No anemia, easy bruising or bleeding SKIN: No rash or lesion. MUSCULOSKELETAL: No joint pain or arthritis.   NEUROLOGIC: No tingling, numbness, weakness.  PSYCHIATRY: No anxiety or depression.   DRUG ALLERGIES:   Allergies  Allergen Reactions  . Penicillins Hives    VITALS:  Blood pressure 180/98, pulse 77, temperature 98.6 F (37 C), temperature source Oral, resp. rate 16, height  (1.6 m), weight 70.761 kg (156 lb), SpO2 93 %.  PHYSICAL EXAMINATION:  GENERAL:  65 y.o.-year-old patient lying in the bed with no acute distress.  EYES: Pupils equal, round, reactive to light and accommodation. No scleral icterus. Extraocular muscles intact.  HEENT: Head atraumatic, normocephalic. Oropharynx and nasopharynx clear.  NECK:  Supple, no jugular venous distention. No thyroid enlargement, no tenderness.  LUNGS: Moderate breath sounds bilaterally, minimal end expiratory wheezing, denies  rales,rhonchi or crepitation. No use of accessory muscles of respiration.  CARDIOVASCULAR: S1, S2 normal. No murmurs, rubs, or gallops.  ABDOMEN: Soft, nontender, nondistended. Bowel sounds present. No organomegaly or mass.   EXTREMITIES: No pedal edema, cyanosis, or clubbing.  NEUROLOGIC: Cranial nerves II through XII are intact. Muscle strength 5/5 in all extremities. Sensation intact. Gait not checked.  PSYCHIATRIC: The patient is alert and oriented x 3.  SKIN: No obvious rash, lesion, or ulcer.    LABORATORY PANEL:   CBC  Recent Labs Lab 12/11/15 0425  WBC 4.4  HGB 13.7  HCT 41.6  PLT 184   ------------------------------------------------------------------------------------------------------------------  Chemistries   Recent Labs Lab 12/10/15 1044 12/11/15 0425  NA 136 138  K 4.1 4.4  CL 101 102  CO2 29 31  GLUCOSE 127* 171*  BUN 12 21*  CREATININE 0.55 0.57  CALCIUM 8.6* 8.9  AST 94*  --   ALT 118*  --   ALKPHOS 89  --   BILITOT 0.5  --    ------------------------------------------------------------------------------------------------------------------  Cardiac Enzymes No results for input(s): TROPONINI in the last 168 hours. ------------------------------------------------------------------------------------------------------------------  RADIOLOGY:  Dg Chest Port 1 View  12/10/2015  CLINICAL DATA:  65 year old female with 3 days of increased wheezing, shortness of breath, congestion. Initial encounter. EXAM: PORTABLE CHEST 1 VIEW COMPARISON:  11/22/2014 and earlier. FINDINGS: Portable AP upright view at 1051 hours. Stable lung volumes. Normal cardiac size and mediastinal contours. Calcified aortic atherosclerosis. Attenuation of upper lobe bronchovascular markings again suggestive of emphysema. No pneumothorax chronic but mildly increased basilar predominant interstitial markings. No pleural effusion or consolidation. IMPRESSION: Acute on chronic lung base interstitial opacity suspicious for acute infectious exacerbation in this setting. No pleural effusion. Upper lobe emphysema suspected. Electronically Signed   By: Odessa Fleming M.D.   On: 12/10/2015 11:07    EKG:   Orders placed or  performed during the hospital encounter of 12/10/15  .  EKG 12-Lead  . EKG 12-Lead    ASSESSMENT AND PLAN:   1. Acute respiratory distress. Clinically improved Requiring BiPAP on initial presentation to the hospital to move air. Continue oxygen supplementation. Patient now off BiPAP on nasal cannula. Patient sees Dr. Meredeth IdeFleming as outpatient. 2. COPD exacerbation. Taper high-dose Solu-Medrol, Xopenex nebulizer and budesonide nebulizers. Levaquin antibiotic. 3. Bibasilar pneumonia. Levaquin  4. Impaired fasting glucose. hemoglobin A1c 6.4.  Patient on Glucophage. 5. Essential hypertension blood pressure is elevated ,on lisinopril, increase to 40 mg once daily and titrate as needed     All the records are reviewed and case discussed with Care Management/Social Workerr. Management plans discussed with the patient, family and they are in agreement.   greater than 50% time was spent on face-to-face education, counseling and coordination of care  CODE STATUS: fc  TOTAL TIME TAKING CARE OF THIS PATIENT: 35  minutes.   POSSIBLE D/C IN 2  DAYS, DEPENDING ON CLINICAL CONDITION.   Ramonita LabGouru, Kayliegh Boyers M.D on 12/11/2015 at 12:16 PM  Between 7am to 6pm - Pager - [phone] After 6pm go to [email or web address] - password [email or web address]  [facility]  Office  [phone]  CC: Primary care physician; [facility]

## 2015-12-12 LAB — GLUCOSE, CAPILLARY: Glucose-Capillary: 170 mg/dL — ABNORMAL HIGH (ref 65–99)

## 2015-12-12 MED ORDER — LEVOFLOXACIN 750 MG PO TABS
750.0000 mg | ORAL_TABLET | Freq: Every day | ORAL | Status: DC
  Administered 2015-12-13: 750 mg via ORAL
  Filled 2015-12-12: qty 1

## 2015-12-12 NOTE — Progress Notes (Signed)
[facility] - [place] at [facility]   PATIENT NAME: Katherine MichaelisGwendolyn Carter    MR#:  [id number]  DATE OF BIRTH:  [birth year]  SUBJECTIVE:  CHIEF COMPLAINT:  Patient is feeling somewhat better. Shortness of breath is better. Uses oxygen 2 L via nasal cannula as needed at home. Not smoking anymore.  REVIEW OF SYSTEMS:  CONSTITUTIONAL: No fever, fatigue or weakness.  EYES: No blurred or double vision.  EARS, NOSE, AND THROAT: No tinnitus or ear pain.  RESPIRATORY: Reporting cough, denies shortness of breath, still has wheezing denies hemoptysis.  CARDIOVASCULAR: No chest pain, orthopnea, edema.  GASTROINTESTINAL: No nausea, vomiting, diarrhea or abdominal pain.  GENITOURINARY: No dysuria, hematuria.  ENDOCRINE: No polyuria, nocturia,  HEMATOLOGY: No anemia, easy bruising or bleeding SKIN: No rash or lesion. MUSCULOSKELETAL: No joint pain or arthritis.   NEUROLOGIC: No tingling, numbness, weakness.  PSYCHIATRY: No anxiety or depression.   DRUG ALLERGIES:   Allergies  Allergen Reactions  . Penicillins Hives    VITALS:  Blood pressure 135/84, pulse 81, temperature 98.2 F (36.8 C), temperature source Oral, resp. rate 18, height 5\' 3"  (1.6 m), weight 70.761 kg (156 lb), SpO2 99 %.  PHYSICAL EXAMINATION:  GENERAL:  65 y.o.-year-old patient lying in the bed with no acute distress.  EYES: Pupils equal, round, reactive to light and accommodation. No scleral icterus. Extraocular muscles intact.  HEENT: Head atraumatic, normocephalic. Oropharynx and nasopharynx clear.  NECK:  Supple, no jugular venous distention. No thyroid enlargement, no tenderness.  LUNGS: Moderate breath sounds bilaterally, minimal end expiratory wheezing, denies  rales,rhonchi or crepitation. No use of accessory muscles of respiration.  CARDIOVASCULAR: S1, S2 normal. No murmurs, rubs, or gallops.  ABDOMEN: Soft, nontender, nondistended. Bowel sounds present. No organomegaly or mass.  EXTREMITIES:  No pedal edema, cyanosis, or clubbing.  NEUROLOGIC: Cranial nerves II through XII are intact. Muscle strength 5/5 in all extremities. Sensation intact. Gait not checked.  PSYCHIATRIC: The patient is alert and oriented x 3.  SKIN: No obvious rash, lesion, or ulcer.    LABORATORY PANEL:   CBC  Recent Labs Lab 12/11/15 0425  WBC 4.4  HGB 13.7  HCT 41.6  PLT 184   ------------------------------------------------------------------------------------------------------------------  Chemistries   Recent Labs Lab 12/10/15 1044 12/11/15 0425  NA 136 138  K 4.1 4.4  CL 101 102  CO2 29 31  GLUCOSE 127* 171*  BUN 12 21*  CREATININE 0.55 0.57  CALCIUM 8.6* 8.9  AST 94*  --   ALT 118*  --   ALKPHOS 89  --   BILITOT 0.5  --    ------------------------------------------------------------------------------------------------------------------  Cardiac Enzymes No results for input(s): TROPONINI in the last 168 hours. ------------------------------------------------------------------------------------------------------------------  RADIOLOGY:  No results found.  EKG:   Orders placed or performed during the hospital encounter of 12/10/15  . EKG 12-Lead  . EKG 12-Lead    ASSESSMENT AND PLAN:   1. Acute respiratory distress. Clinically improved Requiring BiPAP on initial presentation to the hospital to move air. Continue oxygen supplementation. Patient now off BiPAP on nasal cannula. Patient sees Dr. Meredeth IdeFleming as outpatient. 2. COPD exacerbation. Taper high-dose Solu-Medrol, Xopenex nebulizer and budesonide nebulizers. Levaquin antibiotic. 3. Bibasilar pneumonia. Levaquin  4. Impaired fasting glucose. hemoglobin A1c 6.4.  Patient on Glucophage. 5. Essential hypertension blood pressure is better,on lisinopril, increased to 40 mg once daily and titrate as needed  6. Deconditioning-encouraged patient out of bed to ambulate as tolerated Incentive spirometry   All the records are  reviewed  and case discussed with Care Management/Social Workerr. Management plans discussed with the patient, family and they are in agreement.   greater than 50% time was spent on face-to-face education, counseling and coordination of care  CODE STATUS: fc  TOTAL TIME TAKING CARE OF THIS PATIENT: 35  minutes.   POSSIBLE D/C IN am  DAYS, DEPENDING ON CLINICAL CONDITION.   Ramonita Lab M.D on 12/12/2015 at 1:00 PM  Between 7am to 6pm - Pager - [phone] After 6pm go to [email or web address] - password [email or web address]  [facility]  Office  [phone]  CC: Primary care physician; [facility]

## 2015-12-12 NOTE — Progress Notes (Signed)
Pharmacy Antibiotic Note  Katherine Carter is a 65 y.o. female admitted on 12/10/2015 with pneumonia.  Pharmacy has been consulted for Levaquin dosing.  Plan: Levaquin 750mg  IV q24h- transition to PO Levaquin 750mg  Q24h per IV to PO parameters.  Height: 5\' 3"  (160 cm) Weight: 156 lb (70.761 kg) IBW/kg (Calculated) : 52.4  Temp (24hrs), Avg:98.2 F (36.8 C), Min:98 F (36.7 C), Max:98.3 F (36.8 C)   Recent Labs Lab 12/10/15 1044 12/11/15 0425  WBC 7.4 4.4  CREATININE 0.55 0.57    Estimated Creatinine Clearance: 67.1 mL/min (by C-G formula based on Cr of 0.57).    Allergies  Allergen Reactions  . Penicillins Hives    Antimicrobials this admission: Levaquin 4/7 >>   Dose adjustments this admission:  Microbiology results:   Thank you for allowing pharmacy to be a part of this patient's care.  Bari MantisKristin Trygve Thal PharmD Clinical Pharmacist 12/12/2015  2:40 PM

## 2015-12-13 MED ORDER — LISINOPRIL 40 MG PO TABS: 40.0000 mg | ORAL_TABLET | Freq: Every day | ORAL | Status: DC

## 2015-12-13 MED ORDER — LEVOFLOXACIN 750 MG PO TABS: 750.0000 mg | ORAL_TABLET | Freq: Every day | ORAL | Status: DC

## 2015-12-13 MED ORDER — PREDNISONE 10 MG (21) PO TBPK: 10.0000 mg | ORAL_TABLET | Freq: Every day | ORAL | Status: DC

## 2015-12-13 MED ORDER — HYDROCOD POLST-CPM POLST ER 10-8 MG/5ML PO SUER: 5.0000 mL | Freq: Two times a day (BID) | ORAL | Status: DC | PRN

## 2015-12-13 MED ORDER — TIOTROPIUM BROMIDE MONOHYDRATE 18 MCG IN CAPS
18.0000 ug | ORAL_CAPSULE | Freq: Every day | RESPIRATORY_TRACT | Status: DC
Start: 2015-12-13 — End: 2021-09-05

## 2015-12-13 MED ORDER — ACETAMINOPHEN 325 MG PO TABS: 650.0000 mg | ORAL_TABLET | Freq: Four times a day (QID) | ORAL | Status: DC | PRN

## 2015-12-13 NOTE — Evaluation (Signed)
Physical Therapy Evaluation Patient Details Name: Katherine Carter MRN: [id number] DOB: 09-10-1950 Today's Date: 12/13/2015   History of Present Illness  Katherine Carter is a 65yo black female who comes [facility] on 4[facility] p several days coughing, SOB, and fatgiue. Pt found to have PNA upon arrival, admitted for respiratoryfailure.   Clinical Impression  Pt demonstrating impairment of strength (5xSTS in 14.8s), balance (forward reach <5"), O2 perfusion (85% on 4L during standing balance), and activity tolerance (DOE after only ~36min standing), limiting indep in ADL and functional mobility within the home and community. Pt will benefit from skilled PT intervention to address the above deficits in order to restore patient to PLOF and improve safety for return to home. Pt reports she feels she has lost some strength and balance since 2WA, but is not agreeable to any PT services s/p DC.        Follow Up Recommendations Home health PT (Pt is not agreeable to any PT services at DC. )    Equipment Recommendations  None recommended by PT    Recommendations for Other Services       Precautions / Restrictions Precautions Precautions: None      Mobility  Bed Mobility               General bed mobility comments: Received in chair.   Transfers Overall transfer level: Independent Equipment used: None             General transfer comment: 5x STS: 14.8s, hands free.   Ambulation/Gait Ambulation/Gait assistance:  (not appropriate at this time, desats on 4L with balance testing. )              Stairs            Wheelchair Mobility    Modified Rankin (Stroke Patients Only)       Balance Overall balance assessment: Needs assistance   Sitting balance-Leahy Scale: Normal                         High Level Balance Comments: No LOB with turns, standing rotation; instability with forward reach attempt.              Pertinent Vitals/Pain Pain  Assessment: No/denies pain    Home Living Family/patient expects to be discharged to:: Private residence Living Arrangements: Spouse/significant other   Type of Home: House Home Access: Stairs to enter Entrance Stairs-Rails: Can reach both Entrance Stairs-Number of Steps: 4 Home Layout: One level Home Equipment: Cane - single point      Prior Function Level of Independence: Independent         Comments: unlimited community ambulator, with intermittent O2 use x>1year.      Hand Dominance        Extremity/Trunk Assessment                         Communication   Communication: No difficulties  Cognition Arousal/Alertness: Awake/alert Behavior During Therapy: Flat affect Overall Cognitive Status: No family/caregiver present to determine baseline cognitive functioning Area of Impairment: Following commands       Following Commands: Follows one step commands with increased time (difficulty with multistep commands during balance testing. )            General Comments      Exercises        Assessment/Plan    PT Assessment Patient needs continued PT services  PT Diagnosis  Generalized weakness;Altered mental status   PT Problem List Decreased strength;Decreased activity tolerance;Decreased balance;Decreased cognition  PT Treatment Interventions Stair training;Gait training;Functional mobility training;Therapeutic activities;Therapeutic exercise;Balance training;Patient/family education   PT Goals (Current goals can be found in the Care Plan section) Acute Rehab PT Goals Patient Stated Goal: Go home and rest.  PT Goal Formulation: With patient Time For Goal Achievement: 12/27/15 Potential to Achieve Goals: Good    Frequency Min 2X/week   Barriers to discharge        Co-evaluation               End of Session Equipment Utilized During Treatment: Gait belt Activity Tolerance: Patient limited by fatigue;No increased pain Patient left: in  chair;with call bell/phone within reach Nurse Communication: Mobility status         Time: [id number] PT Time Calculation (min) (ACUTE ONLY): 12 min   Charges:   PT Evaluation $PT Eval Low Complexity: 1 Procedure PT Treatments $Therapeutic Activity: 8-22 mins   PT G Codes:       9:00 AM, 12/13/2015 Katherine LintsAllan C Wilberto Carter, PT, DPT PRN Physical Therapist - Katherine Carter License # [id number] [phone] ([phone])  [phone] (mobile)

## 2015-12-13 NOTE — Discharge Instructions (Signed)
Activity as tolerated or as recommended by home health PT Diet low-salt, diabetic Follow-up with primary care physician in a week Follow-up with pulmonology Dr. Meredeth IdeFleming in a week Continue oxygen 2 L via nasal cannula

## 2015-12-13 NOTE — Progress Notes (Signed)
Pt stable. IV removed. D/c instructions given and education provided. Signed prescriptions verified and given. Oxygen tank for home use provided. Pt states she understands instructions. Pt dressed and escorted out by staff. Driven home by family.

## 2015-12-13 NOTE — Progress Notes (Signed)
SATURATION QUALIFICATIONS: (This note is used to comply with regulatory documentation for home oxygen)  Patient Saturations on Room Air at Rest = 87%  Please briefly explain why patient needs home oxygen: Patient short of breath on room air, saturations 87%.

## 2015-12-13 NOTE — Care Management (Signed)
Sp[facility] with Jud[facility]phanie at [facility].  She has verified that the patient does have oxygen through [facility].  Due to change in patient's insurance a new order for O2 will need to be placed.  MD has been notified. RNCM signing off

## 2015-12-13 NOTE — Progress Notes (Signed)
[facility]         [place]   12/13/2015  Patient: Katherine Carter   Date of Birth:  [birth year]  Date of admission:  12/10/2015  Date of Discharge  12/13/2015    To Whom it May Concern:   Katherine Carter  may return to work on 12/15/2015.  PHYSICAL ACTIVITY:  Full, with oxygen via nasal cannula  If you have any questions or concerns, please don't hesitate to call.  Sincerely,   Ramonita LabGouru, Jahsiah Carpenter M.D Pager [phone] Office : [phone]   .

## 2015-12-13 NOTE — Progress Notes (Signed)
Date: 12/13/2015,   MRN# [id number] Katherine Carter [birth year] Code Status:     Code Status Orders        Start     Ordered   12/10/15 1229  Full code   Continuous     12/10/15 1228    Code Status History    Date Active Date Inactive Code Status Order ID Comments User Context   This patient has a current code status but no historical code status.       CC: copd excacerabation  HPI: This is a 65 year old lady who came in 3 days ago with worsening shortness of breath, wheezing and coughing. Her sats were low as well. She was placed on bipap initially. Presently her sob is much better. Still has some wheezing and cough. No hemoptysis, fever or chills. She was lost to follow up  PMHX:   Past Medical History  Diagnosis Date  . COPD (chronic obstructive pulmonary disease) (HCC)    Surgical Hx:  Past Surgical History  Procedure Laterality Date  . Abdominal hysterectomy    . Foot surgery     Family Hx:  Family History  Problem Relation Age of Onset  . CAD Mother   . CAD Father    Social Hx:   Social History  Substance Use Topics  . Smoking status: Former Smoker -- 0.50 packs/day for 25 years    Types: Cigarettes    Quit date: 07/06/2014  . Smokeless tobacco: Never Used  . Alcohol Use: No   Medication:    Home Medication:  No current outpatient prescriptions on file.  Current Medication: @   Allergies:  Penicillins  Review of Systems: Gen:  Denies  fever, sweats, chills HEENT: Denies blurred vision, double vision, ear pain, eye pain, hearing loss, nose bleeds, sore throat Cvc:  No dizziness, chest pain or heaviness Resp: less sob, cough, wheezing better, no hemoptysis     Gi: Denies swallowing difficulty, stomach pain, nausea or vomiting, diarrhea, constipation, bowel incontinence Gu:  Denies bladder incontinence, burning urine Ext:   No Joint pain, stiffness or swelling Skin: No skin rash, easy bruising or bleeding or hives Endoc:  No  polyuria, polydipsia , polyphagia or weight change Psych: No depression, insomnia or hallucinations  Other:  All other systems negative  Physical Examination:   VS: BP 156/96 mmHg  Pulse 92  Temp(Src) 98 F (36.7 C) (Oral)  Resp 22  Ht  (1.6 m)  Wt 156 lb (70.761 kg)  BMI 27.64 kg/m2  SpO2 93%  General Appearance: No distress  Neuro/psych: without focal findings, mental status, speech normal, alert and oriented, cranial nerves 2-12 intact, reflexes normal and symmetric, sensation grossly normal  HEENT: PERRLA, EOM intact, no ptosis, no other lesions noticed: NECK: Supple, no stridor, jvd Pulmonary:.positive  wheezing, No rales    Cardiovascular:  Normal S1,S2.  No m/r/g.  Abdomen:Benign, Soft, non-tender, No masses, hepatosplenomegaly, No lymphadenopathy Endoc: No evident thyromegaly, no signs of acromegaly or Cushing features Skin:   warm, no rashes, no ecchymosis  Extremities: normal, no cyanosis, clubbing, no edema, warm with normal capillary refill. Other findings:   Labs results:   Recent Labs     12/11/15  0425  HGB  13.7  HCT  41.6  MCV  91.1  WBC  4.4  BUN  21*  CREATININE  0.57  GLUCOSE  171*  CALCIUM  8.9  ,   Rad results:  CLINICAL DATA: 65 year old female with 3 days of  increased wheezing, shortness of breath, congestion. Initial encounter.  EXAM: PORTABLE CHEST 1 VIEW  COMPARISON: 11/22/2014 and earlier.  FINDINGS: Portable AP upright view at 1051 hours. Stable lung volumes. Normal cardiac size and mediastinal contours. Calcified aortic atherosclerosis. Attenuation of upper lobe bronchovascular markings again suggestive of emphysema. No pneumothorax chronic but mildly increased basilar predominant interstitial markings. No pleural effusion or consolidation.  IMPRESSION: Acute on chronic lung base interstitial opacity suspicious for acute infectious exacerbation in this setting. No pleural effusion. Upper lobe emphysema  suspected.   Electronically Signed  By: Odessa FlemingH Hall M.D.  On: 12/10/2015 11:07   Assessment and Plan: Copd exacerbation, much improved. Residual cough and wheezing present. On oxygen -continue dulera/albuterol -add spiriva -prednisone taper -complete 10 day course of levaqion -follow up in pulmonary in 7-10 days  I have personally obtained a history, examined the patient, evaluated laboratory and imaging results, formulated the assessment and plan and placed orders.  The Patient requires high complexity decision making for assessment and support, frequent evaluation and titration of therapies, application of advanced monitoring technologies and extensive interpretation of multiple databases.   Herbon Fleming,M.D. Pulmonary & Critical care Medicine [facility]

## 2015-12-13 NOTE — Care Management Note (Addendum)
Case Management Note  Patient Details  Name: Katherine Carter MRN: [id number] Date of Birth: Jul 29, 1951  Subjective/Objective:            Patient admitted from home with acute respiratory distress.       Patient states that she lives at home with her husband.  Patient states that [facility] and will be obtaining her medication from their pharmacy.  Patient states that they only medical equipment that she has is home O2.  It is provided by [facility].  She states that she wears it as needed, however she does have portable tanks.  Patient does have qualifying saturations for continuous oxygen.  Patient denies any difficulty obtaining her medication or issues with transportation.  Pt has recommend home health PT.  Patient has declined.    Action/Plan: I have contacted [facility] to confirm their Oxygen order that they have on file.  Awaiting return call  Expected Discharge Date:                  Expected Discharge Plan:     In-House Referral:     Discharge planning Services     Post Acute Care Choice:    Choice offered to:     DME Arranged:    DME Agency:     HH Arranged:    HH Agency:     Status of Service:     Medicare Important Message Given:    Date Medicare IM Given:    Medicare IM give by:    Date Additional Medicare IM Given:    Additional Medicare Important Message give by:     If discussed at Long Length of Stay Meetings, dates discussed:    Additional Comments:  Chapman FitchBOWEN, Kengo Sturges T, RN 12/13/2015, 10:45 AM

## 2015-12-13 NOTE — Discharge Summary (Signed)
[facility] [place] [facility]   PATIENT NAME: Katherine MichaelisGwendolyn Carter    MR#:  [id number]  DATE OF BIRTH:  15-Mar-1951  DATE OF ADMISSION:  12/10/2015 ADMITTING PHYSICIAN: Alford Highlandichard Wieting, MD  DATE OF DISCHARGE:12/13/15 PRIMARY CARE PHYSICIAN: [facility]    ADMISSION DIAGNOSIS:  Chronic obstructive pulmonary disease with acute exacerbation (HCC) [J44.1]  DISCHARGE DIAGNOSIS:  Active Problems:   Acute respiratory failure (HCC)  pneumonia of both lower lobes Acute COPD  SECONDARY DIAGNOSIS:   Past Medical History  Diagnosis Date  . COPD (chronic obstructive pulmonary disease) (HCC)     HOSPITAL COURSE:  1. Acute respiratory distress. Clinically improved Requiring BiPAP on initial presentation to the hospital to move air. Continue oxygen supplementation. Patient now off BiPAP on nasal cannula. Patient sees Dr. Meredeth IdeFleming as outpatient. 2. COPD exacerbation. Taper high-dose Solu-Medrol, Xopenex nebulizer and budesonide nebulizers during hospital course. Discharge patient with by mouth Levaquin antibiotic for a total of 10 day course. Outpatient follow-up with Dr. Meredeth IdeFleming in 7-10 days. Provide prednisone tapering. Continue Dulera, albuterol and adding Spiriva 3. Bibasilar pneumonia. Levaquin  4. Impaired fasting glucose. hemoglobin A1c 6.4. Patient on Glucophage. 5. Essential hypertension blood pressure is better,on lisinopril, increased to 40 mg once daily and titrate as needed   Disposition home with home health physical therapy per PTs recommendations  DISCHARGE CONDITIONS:   Fair  CONSULTS OBTAINED:  Treatment Team:  Mertie MooresHerbon E Fleming, MD   PROCEDURES none  DRUG ALLERGIES:   Allergies  Allergen Reactions  . Penicillins Hives    DISCHARGE MEDICATIONS:   Current Discharge Medication List    START taking these medications   Details  acetaminophen (TYLENOL) 325 MG tablet Take 2 tablets (650 mg total) by mouth every 6 (six)  hours as needed for mild pain (or Fever >/= 101).    levofloxacin (LEVAQUIN) 750 MG tablet Take 1 tablet (750 mg total) by mouth daily. Qty: 7 tablet, Refills: 0    predniSONE (STERAPRED UNI-PAK 21 TAB) 10 MG (21) TBPK tablet Take 1 tablet (10 mg total) by mouth daily. Take 6 tablets by mouth for 1 day followed by  5 tablets by mouth for 1 day followed by  4 tablets by mouth for 1 day followed by  3 tablets by mouth for 1 day followed by  2 tablets by mouth for 1 day followed by  1 tablet by mouth for a day and stop Qty: 21 tablet, Refills: 0    tiotropium (SPIRIVA HANDIHALER) 18 MCG inhalation capsule Place 1 capsule (18 mcg total) into inhaler and inhale daily. Qty: 30 capsule, Refills: 0      CONTINUE these medications which have CHANGED   Details  chlorpheniramine-HYDROcodone (TUSSIONEX PENNKINETIC ER) 10-8 MG/5ML SUER Take 5 mLs by mouth every 12 (twelve) hours as needed for cough. Qty: 115 mL, Refills: 0    lisinopril (PRINIVIL,ZESTRIL) 40 MG tablet Take 1 tablet (40 mg total) by mouth daily. Qty: 30 tablet, Refills: 0      CONTINUE these medications which have NOT CHANGED   Details  aspirin EC 81 MG tablet Take 81 mg by mouth daily.    diclofenac (VOLTAREN) 75 MG EC tablet Take 75 mg by mouth 2 (two) times daily as needed.    fexofenadine-pseudoephedrine (ALLEGRA-D ALLERGY & CONGESTION) 180-240 MG 24 hr tablet Take 1 tablet by mouth daily. Qty: 30 tablet, Refills: 0    levalbuterol (XOPENEX HFA) 45 MCG/ACT inhaler Inhale 2 puffs into the lungs every 4 (  four) hours as needed for wheezing.    metFORMIN (GLUCOPHAGE-XR) 500 MG 24 hr tablet Take 500 mg by mouth daily with breakfast.    mometasone-formoterol (DULERA) 100-5 MCG/ACT AERO Inhale 1 puff into the lungs 2 (two) times daily.      STOP taking these medications     azithromycin (ZITHROMAX Z-PAK) 250 MG tablet      predniSONE (DELTASONE) 10 MG tablet          DISCHARGE INSTRUCTIONS:   Activity as  tolerated or as recommended by home health PT Diet low-salt, diabetic Follow-up with primary care physician in a week Follow-up with pulmonology Dr. Meredeth Ide in a week   DIET:  Diabetic diet, low-salt  DISCHARGE CONDITION:  Fair  ACTIVITY:  Activity as tolerated  OXYGEN:  Home Oxygen: Yes.     Oxygen Delivery: 2 liters/min via Patient connected to nasal cannula oxygen  DISCHARGE LOCATION:  home   If you experience worsening of your admission symptoms, develop shortness of breath, life threatening emergency, suicidal or homicidal thoughts you must seek medical attention immediately by calling 911 or calling your MD immediately  if symptoms less severe.  You Must read complete instructions/literature along with all the possible adverse reactions/side effects for all the Medicines you take and that have been prescribed to you. Take any new Medicines after you have completely understood and accpet all the possible adverse reactions/side effects.   Please note  You were cared for by a hospitalist during your hospital stay. If you have any questions about your discharge medications or the care you received while you were in the hospital after you are discharged, you can call the unit and asked to speak with the hospitalist on call if the hospitalist that took care of you is not available. Once you are discharged, your primary care physician will handle any further medical issues. Please note that NO REFILLS for any discharge medications will be authorized once you are discharged, as it is imperative that you return to your primary care physician (or establish a relationship with a primary care physician if you do not have one) for your aftercare needs so that they can reassess your need for medications and monitor your lab values.     Today  Chief Complaint  Patient presents with  . Shortness of Breath   Patient is feeling much better. Cough is improving. Shortness of breath is  better. Wants to go home. Denies any chest pain  ROS:  CONSTITUTIONAL: Denies fevers, chills. Denies any fatigue, weakness.  EYES: Denies blurry vision, double vision, eye pain. EARS, NOSE, THROAT: Denies tinnitus, ear pain, hearing loss. RESPIRATORY:  cough is getting better, denies wheeze, shortness of breath.  CARDIOVASCULAR: Denies chest pain, palpitations, edema.  GASTROINTESTINAL: Denies nausea, vomiting, diarrhea, abdominal pain. Denies bright red blood per rectum. GENITOURINARY: Denies dysuria, hematuria. ENDOCRINE: Denies nocturia or thyroid problems. HEMATOLOGIC AND LYMPHATIC: Denies easy bruising or bleeding. SKIN: Denies rash or lesion. MUSCULOSKELETAL: Denies pain in neck, back, shoulder, knees, hips or arthritic symptoms.  NEUROLOGIC: Denies paralysis, paresthesias.  PSYCHIATRIC: Denies anxiety or depressive symptoms.   VITAL SIGNS:  Blood pressure 136/71, pulse 94, temperature 98.4 F (36.9 C), temperature source Oral, resp. rate 18, height  (1.6 m), weight 70.761 kg (156 lb), SpO2 95 %.  I/O:    Intake/Output Summary (Last 24 hours) at 12/13/15 1423 Last data filed at 12/13/15 0900  Gross per 24 hour  Intake    480 ml  Output  0 ml  Net    480 ml    PHYSICAL EXAMINATION:  GENERAL:  65 y.o.-year-old patient lying in the bed with no acute distress.  EYES: Pupils equal, round, reactive to light and accommodation. No scleral icterus. Extraocular muscles intact.  HEENT: Head atraumatic, normocephalic. Oropharynx and nasopharynx clear.  NECK:  Supple, no jugular venous distention. No thyroid enlargement, no tenderness.  LUNGS: Normal breath sounds bilaterally, no wheezing, rales,rhonchi , some basal crepitation. No use of accessory muscles of respiration.  CARDIOVASCULAR: S1, S2 normal. No murmurs, rubs, or gallops.  ABDOMEN: Soft, non-tender, non-distended. Bowel sounds present. No organomegaly or mass.  EXTREMITIES: No pedal edema, cyanosis, or clubbing.   NEUROLOGIC: Cranial nerves II through XII are intact. Muscle strength 5/5 in all extremities. Sensation intact. Gait not checked.  PSYCHIATRIC: The patient is alert and oriented x 3.  SKIN: No obvious rash, lesion, or ulcer.   DATA REVIEW:   CBC  Recent Labs Lab 12/11/15 0425  WBC 4.4  HGB 13.7  HCT 41.6  PLT 184    Chemistries   Recent Labs Lab 12/10/15 1044 12/11/15 0425  NA 136 138  K 4.1 4.4  CL 101 102  CO2 29 31  GLUCOSE 127* 171*  BUN 12 21*  CREATININE 0.55 0.57  CALCIUM 8.6* 8.9  AST 94*  --   ALT 118*  --   ALKPHOS 89  --   BILITOT 0.5  --     Cardiac Enzymes No results for input(s): TROPONINI in the last 168 hours.  Microbiology Results  Results for orders placed or performed in visit on 03/09/14  Rapid Strep-A with Reflx     Status: None   Collection Time: 03/09/14 12:32 PM  Result Value Ref Range Status   Micro Text Report NEGATIVE  Final  Beta Strep Culture [facility])     Status: None   Collection Time: 03/09/14 12:32 PM  Result Value Ref Range Status   Micro Text Report   Final       SOURCE: THROAT    COMMENT                   NO BETA STREPTOCOCCUS ISOLATED IN 48 HOURS   ANTIBIOTIC                                                        RADIOLOGY:  Dg Chest Port 1 View  12/10/2015  CLINICAL DATA:  65 year old female with 3 days of increased wheezing, shortness of breath, congestion. Initial encounter. EXAM: PORTABLE CHEST 1 VIEW COMPARISON:  11/22/2014 and earlier. FINDINGS: Portable AP upright view at 1051 hours. Stable lung volumes. Normal cardiac size and mediastinal contours. Calcified aortic atherosclerosis. Attenuation of upper lobe bronchovascular markings again suggestive of emphysema. No pneumothorax chronic but mildly increased basilar predominant interstitial markings. No pleural effusion or consolidation. IMPRESSION: Acute on chronic lung base interstitial opacity suspicious for acute infectious exacerbation in this setting. No  pleural effusion. Upper lobe emphysema suspected. Electronically Signed   By: Odessa Fleming M.D.   On: 12/10/2015 11:07    EKG:   Orders placed or performed during the hospital encounter of 12/10/15  . EKG 12-Lead  . EKG 12-Lead      Management plans discussed with the patient,  and she is in agreement. Greater than  50% time was spent on coordination of care, face-to-face education and counseling  CODE STATUS:     Code Status Orders        Start     Ordered   12/10/15 1229  Full code   Continuous     12/10/15 1228    Code Status History    Date Active Date Inactive Code Status Order ID Comments User Context   This patient has a current code status but no historical code status.      TOTAL TIME TAKING CARE OF THIS PATIENT: 45 minutes.    @  on 12/13/2015 at 2:23 PM  Between 7am to 6pm - Pager - [phone]  After 6pm go to [email or web address] - password [email or web address] [facility]  [facility]  Office  [phone]  CC: Primary care physician; [facility]

## 2015-12-13 NOTE — Plan of Care (Signed)
Problem: Acute Rehab PT Goals(only PT should resolve) Goal: Pt Will Ambulate c O2sats >89%.     Goal: Pt Will Go Up/Down Stairs c O2sats > 88%.

## 2016-08-03 ENCOUNTER — Other Ambulatory Visit: Payer: Self-pay | Admitting: Internal Medicine

## 2016-08-03 DIAGNOSIS — Z13820 Encounter for screening for osteoporosis: Secondary | ICD-10-CM

## 2016-09-19 ENCOUNTER — Ambulatory Visit: Payer: BLUE CROSS/BLUE SHIELD | Attending: Internal Medicine

## 2016-09-23 ENCOUNTER — Ambulatory Visit
Admission: EM | Admit: 2016-09-23 | Discharge: 2016-09-23 | Disposition: A | Discharge status: Home / Self Care | Payer: Medicare Other | Attending: Family Medicine | Admitting: Family Medicine

## 2016-09-23 ENCOUNTER — Encounter: Payer: Self-pay | Admitting: Emergency Medicine

## 2016-09-23 DIAGNOSIS — J209 Acute bronchitis, unspecified: Secondary | ICD-10-CM | POA: Diagnosis not present

## 2016-09-23 DIAGNOSIS — J441 Chronic obstructive pulmonary disease with (acute) exacerbation: Secondary | ICD-10-CM | POA: Diagnosis not present

## 2016-09-23 HISTORY — DX: Type 2 diabetes mellitus without complications: E11.9

## 2016-09-23 LAB — RAPID INFLUENZA A&B ANTIGENS
Influenza A (ARMC): NEGATIVE
Influenza B (ARMC): NEGATIVE

## 2016-09-23 LAB — RAPID STREP SCREEN (MED CTR MEBANE ONLY): Streptococcus, Group A Screen (Direct): NEGATIVE

## 2016-09-23 MED ORDER — HYDROCOD POLST-CPM POLST ER 10-8 MG/5ML PO SUER: 5.0000 mL | Freq: Two times a day (BID) | ORAL | 0 refills | Status: DC | PRN

## 2016-09-23 MED ORDER — PREDNISONE 10 MG (21) PO TBPK: ORAL_TABLET | ORAL | 0 refills | Status: DC

## 2016-09-23 MED ORDER — AZITHROMYCIN 250 MG PO TABS: ORAL_TABLET | ORAL | 0 refills | Status: DC

## 2016-09-23 MED ORDER — IPRATROPIUM-ALBUTEROL 0.5-2.5 (3) MG/3ML IN SOLN
3.0000 mL | Freq: Once | RESPIRATORY_TRACT | Status: AC
  Administered 2016-09-23: 3 mL via RESPIRATORY_TRACT

## 2016-09-23 NOTE — ED Triage Notes (Signed)
Patient c/o cough, chest congestion, and sore throat since Wed.  Patient denies fevers.
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
## 2016-09-26 LAB — CULTURE, GROUP A STREP (THRC)

## 2017-01-22 ENCOUNTER — Encounter: Payer: Self-pay | Admitting: *Deleted

## 2017-01-23 ENCOUNTER — Encounter
Admission: RE | Disposition: A | Discharge status: Home / Self Care | Payer: Self-pay | Source: Ambulatory Visit | Attending: Gastroenterology

## 2017-01-23 ENCOUNTER — Ambulatory Visit: Payer: Medicare Other | Admitting: Certified Registered Nurse Anesthetist

## 2017-01-23 ENCOUNTER — Encounter: Payer: Self-pay | Admitting: Certified Registered Nurse Anesthetist

## 2017-01-23 ENCOUNTER — Ambulatory Visit
Admission: RE | Admit: 2017-01-23 | Discharge: 2017-01-23 | Disposition: A | Discharge status: Home / Self Care | Payer: Medicare Other | Source: Ambulatory Visit | Attending: Gastroenterology | Admitting: Gastroenterology

## 2017-01-23 DIAGNOSIS — Z87891 Personal history of nicotine dependence: Secondary | ICD-10-CM | POA: Diagnosis not present

## 2017-01-23 DIAGNOSIS — Z86010 Personal history of colonic polyps: Secondary | ICD-10-CM | POA: Insufficient documentation

## 2017-01-23 DIAGNOSIS — Z7951 Long term (current) use of inhaled steroids: Secondary | ICD-10-CM | POA: Diagnosis not present

## 2017-01-23 DIAGNOSIS — Z1211 Encounter for screening for malignant neoplasm of colon: Secondary | ICD-10-CM | POA: Diagnosis present

## 2017-01-23 DIAGNOSIS — Z791 Long term (current) use of non-steroidal anti-inflammatories (NSAID): Secondary | ICD-10-CM | POA: Diagnosis not present

## 2017-01-23 DIAGNOSIS — E119 Type 2 diabetes mellitus without complications: Secondary | ICD-10-CM | POA: Insufficient documentation

## 2017-01-23 DIAGNOSIS — K5730 Diverticulosis of large intestine without perforation or abscess without bleeding: Secondary | ICD-10-CM | POA: Diagnosis not present

## 2017-01-23 DIAGNOSIS — J449 Chronic obstructive pulmonary disease, unspecified: Secondary | ICD-10-CM | POA: Insufficient documentation

## 2017-01-23 DIAGNOSIS — I10 Essential (primary) hypertension: Secondary | ICD-10-CM | POA: Insufficient documentation

## 2017-01-23 DIAGNOSIS — Z7984 Long term (current) use of oral hypoglycemic drugs: Secondary | ICD-10-CM | POA: Insufficient documentation

## 2017-01-23 DIAGNOSIS — Z880 Allergy status to penicillin: Secondary | ICD-10-CM | POA: Insufficient documentation

## 2017-01-23 DIAGNOSIS — Z79899 Other long term (current) drug therapy: Secondary | ICD-10-CM | POA: Insufficient documentation

## 2017-01-23 DIAGNOSIS — Z7982 Long term (current) use of aspirin: Secondary | ICD-10-CM | POA: Insufficient documentation

## 2017-01-23 HISTORY — PX: COLONOSCOPY WITH PROPOFOL: SHX5780

## 2017-01-23 HISTORY — DX: Essential (primary) hypertension: I10

## 2017-01-23 LAB — GLUCOSE, CAPILLARY: GLUCOSE-CAPILLARY: 94 mg/dL (ref 65–99)

## 2017-01-23 SURGERY — COLONOSCOPY WITH PROPOFOL
Anesthesia: General

## 2017-01-23 MED ORDER — PROPOFOL 10 MG/ML IV BOLUS
INTRAVENOUS | Status: DC | PRN
  Administered 2017-01-23: 30 mg via INTRAVENOUS

## 2017-01-23 MED ORDER — SODIUM CHLORIDE 0.9 % IV SOLN: INTRAVENOUS | Status: DC

## 2017-01-23 MED ORDER — MIDAZOLAM HCL 2 MG/2ML IJ SOLN
INTRAMUSCULAR | Status: DC | PRN
  Administered 2017-01-23: 2 mg via INTRAVENOUS

## 2017-01-23 MED ORDER — PROPOFOL 500 MG/50ML IV EMUL
INTRAVENOUS | Status: DC | PRN
  Administered 2017-01-23: 120 ug/kg/min via INTRAVENOUS

## 2017-01-23 MED ORDER — MIDAZOLAM HCL 2 MG/2ML IJ SOLN
INTRAMUSCULAR | Status: AC
  Filled 2017-01-23: qty 2

## 2017-01-23 MED ORDER — LIDOCAINE HCL (CARDIAC) 20 MG/ML IV SOLN
INTRAVENOUS | Status: DC | PRN
  Administered 2017-01-23: 30 mg via INTRAVENOUS

## 2017-01-23 MED ORDER — SODIUM CHLORIDE 0.9 % IV SOLN
INTRAVENOUS | Status: DC
  Administered 2017-01-23: 10:00:00 via INTRAVENOUS

## 2017-01-23 MED ORDER — PROPOFOL 500 MG/50ML IV EMUL
INTRAVENOUS | Status: AC
  Filled 2017-01-23: qty 50

## 2017-01-23 MED ORDER — PHENYLEPHRINE HCL 10 MG/ML IJ SOLN
INTRAMUSCULAR | Status: DC | PRN
  Administered 2017-01-23: 50 ug via INTRAVENOUS

## 2017-01-23 NOTE — Anesthesia Postprocedure Evaluation (Signed)
Anesthesia Post Note  Patient: Katherine Carter  Procedure(s) Performed: Procedure(s) (LRB): COLONOSCOPY WITH PROPOFOL (N/A)  Patient location during evaluation: Endoscopy Anesthesia Type: General Level of consciousness: awake and alert Pain management: pain level controlled Vital Signs Assessment: post-procedure vital signs reviewed and stable Respiratory status: spontaneous breathing, nonlabored ventilation, respiratory function stable and patient connected to nasal cannula oxygen Cardiovascular status: blood pressure returned to baseline and stable Postop Assessment: no signs of nausea or vomiting Anesthetic complications: no     Last Vitals:  Vitals:   01/23/17 1119 01/23/17 1120  BP: 138/72   Pulse: 60 61  Resp: 15   Temp:      Last Pain:  Vitals:   01/23/17 1035  TempSrc: Tympanic                 Lainy Wrobleski S

## 2017-01-23 NOTE — Anesthesia Post-op Follow-up Note (Cosign Needed)
Anesthesia QCDR form completed.        

## 2017-01-23 NOTE — Op Note (Signed)
[facility] Gastroenterology Patient Name: Katherine Carter Procedure Date: 01/23/2017 9:59 AM MRN: [id number] Account #: [id number] Date of Birth: [birth year] Admit Type: Outpatient Age: [age] [facility] Gender: Female Note Status: Finalized Procedure:            Colonoscopy Indications:          Personal history of colonic polyps Providers:            Christena DeemMartin U. Gesenia Bantz, MD Referring MD:         Ngwe A. Aycock MD (Referring MD) Medicines:            Monitored Anesthesia Care Complications:        No immediate complications. Procedure:            Pre-Anesthesia Assessment:                       - ASA Grade Assessment: III - A patient with severe                        systemic disease.                       After obtaining informed consent, the colonoscope was                        passed under direct vision. Throughout the procedure,                        the patient's blood pressure, pulse, and oxygen                        saturations were monitored continuously. The                        Colonoscope was introduced through the anus and                        advanced to the the cecum, identified by appendiceal                        orifice and ileocecal valve. The colonoscopy was                        unusually difficult due to multiple diverticula in the                        colon. The patient tolerated the procedure well. The                        quality of the bowel preparation was good. Findings:      Many small and large-mouthed diverticula were found in the entire colon.      The retroflexed view of the distal rectum and anal verge was normal and       showed no anal or rectal abnormalities.      The digital rectal exam was normal. Impression:           - Diverticulosis in the entire examined colon.                       - The distal rectum and anal verge  are normal on                        retroflexion view.                       -  No specimens collected. Recommendation:       - Discharge patient to home.                       - Repeat colonoscopy in 5 years for adenoma                        surveillance. Procedure Code(s):    --- Professional ---                       [phone], Colonoscopy, flexible; diagnostic, including                        collection of specimen(s) by brushing or washing, when                        performed (separate procedure) Diagnosis Code(s):    --- Professional ---                       Z86.010, Personal history of colonic polyps                       K57.30, Diverticulosis of large intestine without                        perforation or abscess without bleeding CPT copyright 2016 American Medical Association. All rights reserved. The codes documented in this report are preliminary and upon coder review may  be revised to meet current compliance requirements. Christena Deem, MD 01/23/2017 10:33:18 AM This report has been signed electronically. Number of Addenda: 0 Note Initiated On: 01/23/2017 9:59 AM Scope Withdrawal Time: 0 hours 7 minutes 29 seconds  Total Procedure Duration: 0 hours 19 minutes 3 seconds       [facility]

## 2017-01-23 NOTE — Anesthesia Procedure Notes (Signed)
Date/Time: 01/23/2017 10:01 AM Performed by: Ginger CarneMICHELET, Theodore Virgin Pre-anesthesia Checklist: Patient identified, Emergency Drugs available, Suction available, Patient being monitored and Timeout performed Patient Re-evaluated:Patient Re-evaluated prior to inductionOxygen Delivery Method: Nasal cannula

## 2017-01-23 NOTE — Anesthesia Post-op Follow-up Note (Deleted)
Anesthesia QCDR form completed.        

## 2017-01-23 NOTE — Anesthesia Preprocedure Evaluation (Addendum)
Anesthesia Evaluation  Patient identified by MRN, date of birth, ID band Patient awake    Reviewed: Allergy & Precautions, NPO status , Patient's Chart, lab work & pertinent test results, reviewed documented beta blocker date and time   Airway Mallampati: II  TM Distance: >3 FB     Dental  (+) Chipped, Upper Dentures, Partial Lower, Poor Dentition   Pulmonary COPD, former smoker,           Cardiovascular hypertension, Pt. on medications      Neuro/Psych    GI/Hepatic   Endo/Other  diabetes, Type 2  Renal/GU      Musculoskeletal   Abdominal   Peds  Hematology   Anesthesia Other Findings   Reproductive/Obstetrics                            Anesthesia Physical Anesthesia Plan  ASA: III  Anesthesia Plan: General   Post-op Pain Management:    Induction: Intravenous  Airway Management Planned:   Additional Equipment:   Intra-op Plan:   Post-operative Plan:   Informed Consent: I have reviewed the patients History and Physical, chart, labs and discussed the procedure including the risks, benefits and alternatives for the proposed anesthesia with the patient or authorized representative who has indicated his/her understanding and acceptance.     Plan Discussed with: CRNA  Anesthesia Plan Comments:         Anesthesia Quick Evaluation

## 2017-01-23 NOTE — Transfer of Care (Signed)
Immediate Anesthesia Transfer of Care Note  Patient: Katherine Carter  Procedure(s) Performed: Procedure(s): COLONOSCOPY WITH PROPOFOL (N/A)  Patient Location: PACU  Anesthesia Type:General  Level of Consciousness: sedated  Airway & Oxygen Therapy: Patient Spontanous Breathing and Patient connected to nasal cannula oxygen  Post-op Assessment: Report given to RN and Post -op Vital signs reviewed and stable  Post vital signs: Reviewed and stable  Last Vitals:  Vitals:   01/23/17 0908 01/23/17 1035  BP: (!) 142/84 (!) 94/54  Pulse: 67 68  Resp: 16 15  Temp: (!) 35.9 C 36.6 C    Last Pain:  Vitals:   01/23/17 1035  TempSrc: Tympanic         Complications: No apparent anesthesia complications

## 2017-01-23 NOTE — H&P (Signed)
Outpatient short stay form Pre-procedure 01/23/2017 9:57 AM Katherine DeemMartin U Katherine Elms MD  Primary Physician: Dr. Myrene GalasHarriet Burns Reason for visit:  Colonoscopy  History of present illness:  Patient is a 66 year old female presenting today as above. She has personal history of adenomatous colon polyp on colonoscopy in 2007. She has not had a repeat procedure since then. She does take a daily 81 mg aspirin. She takes no other aspirin products or blood thinning agents. She tolerated her prep well.     Current Facility-Administered Medications:  .  0.9 %  sodium chloride infusion, , Intravenous, Continuous, Katherine DeemSkulskie, Mlissa Tamayo U, MD, Last Rate: 20 mL/hr at 01/23/17 0930 .  0.9 %  sodium chloride infusion, , Intravenous, Continuous, Katherine DeemSkulskie, Tayton Decaire U, MD  Prescriptions Prior to Admission  Medication Sig Dispense Refill Last Dose  . acetaminophen (TYLENOL) 325 MG tablet Take 2 tablets (650 mg total) by mouth every 6 (six) hours as needed for mild pain (or Fever >/= 101).   Past Month at Unknown time  . aspirin EC 81 MG tablet Take 81 mg by mouth daily.   01/22/2017 at Unknown time  . diclofenac (VOLTAREN) 75 MG EC tablet Take 75 mg by mouth 2 (two) times daily as needed.   Past Month at Unknown time  . fexofenadine-pseudoephedrine (ALLEGRA-D ALLERGY & CONGESTION) 180-240 MG 24 hr tablet Take 1 tablet by mouth daily. 30 tablet 0 01/22/2017 at Unknown time  . levalbuterol (XOPENEX HFA) 45 MCG/ACT inhaler Inhale 2 puffs into the lungs every 4 (four) hours as needed for wheezing.   01/23/2017 at Unknown time  . metFORMIN (GLUCOPHAGE-XR) 500 MG 24 hr tablet Take 500 mg by mouth daily with breakfast.   01/23/2017 at Unknown time  . mometasone-formoterol (DULERA) 100-5 MCG/ACT AERO Inhale 1 puff into the lungs 2 (two) times daily.   01/23/2017 at Unknown time  . tiotropium (SPIRIVA HANDIHALER) 18 MCG inhalation capsule Place 1 capsule (18 mcg total) into inhaler and inhale daily. 30 capsule 0 01/22/2017 at Unknown time  .  azithromycin (ZITHROMAX Z-PAK) 250 MG tablet Take 2 tablets first day and then 1 po a day for 4 days (Patient not taking: Reported on 01/23/2017) 6 tablet 0 Not Taking at Unknown time  . chlorpheniramine-HYDROcodone (TUSSIONEX PENNKINETIC ER) 10-8 MG/5ML SUER Take 5 mLs by mouth every 12 (twelve) hours as needed for cough. (Patient not taking: Reported on 01/23/2017) 115 mL 0 Not Taking at Unknown time  . predniSONE (STERAPRED UNI-PAK 21 TAB) 10 MG (21) TBPK tablet Sig 6 tablet day 1, 5 tablets day 2, 4 tablets day 3,,3tablets day 4, 2 tablets day 5, 1 tablet day 6 take all tablets orally (Patient not taking: Reported on 01/23/2017) 21 tablet 0 Completed Course at Unknown time     Allergies  Allergen Reactions  . Penicillins Hives     Past Medical History:  Diagnosis Date  . COPD (chronic obstructive pulmonary disease) (HCC)   . Diabetes mellitus without complication (HCC)   . Hypertension     Review of systems:      Physical Exam    Heart and lungs: Regular rate and rhythm without rub or gallop, lungs are bilaterally clear.    HEENT: Normocephalic atraumatic eyes are anicteric    Other:     Pertinant exam for procedure: Soft nontender nondistended bowel sounds positive normoactive.    Planned proceedures: Colonoscopy and indicated procedures. I have discussed the risks benefits and complications of procedures to include not limited to bleeding, infection, perforation  and the risk of sedation and the patient wishes to proceed.    Katherine Deem, MD Gastroenterology 01/23/2017  9:57 AM

## 2017-01-25 ENCOUNTER — Encounter: Payer: Self-pay | Admitting: Gastroenterology

## 2017-02-13 ENCOUNTER — Emergency Department: Payer: Medicare Other

## 2017-02-13 DIAGNOSIS — I10 Essential (primary) hypertension: Secondary | ICD-10-CM | POA: Insufficient documentation

## 2017-02-13 DIAGNOSIS — F17210 Nicotine dependence, cigarettes, uncomplicated: Secondary | ICD-10-CM | POA: Insufficient documentation

## 2017-02-13 DIAGNOSIS — J441 Chronic obstructive pulmonary disease with (acute) exacerbation: Secondary | ICD-10-CM | POA: Diagnosis not present

## 2017-02-13 DIAGNOSIS — J40 Bronchitis, not specified as acute or chronic: Secondary | ICD-10-CM | POA: Diagnosis not present

## 2017-02-13 DIAGNOSIS — Z7984 Long term (current) use of oral hypoglycemic drugs: Secondary | ICD-10-CM | POA: Insufficient documentation

## 2017-02-13 DIAGNOSIS — Z7982 Long term (current) use of aspirin: Secondary | ICD-10-CM | POA: Insufficient documentation

## 2017-02-13 DIAGNOSIS — Z79899 Other long term (current) drug therapy: Secondary | ICD-10-CM | POA: Diagnosis not present

## 2017-02-13 DIAGNOSIS — E119 Type 2 diabetes mellitus without complications: Secondary | ICD-10-CM | POA: Diagnosis not present

## 2017-02-13 DIAGNOSIS — R05 Cough: Secondary | ICD-10-CM | POA: Diagnosis present

## 2017-02-13 LAB — BASIC METABOLIC PANEL
ANION GAP: 8 (ref 5–15)
BUN: 11 mg/dL (ref 6–20)
CHLORIDE: 108 mmol/L (ref 101–111)
CO2: 25 mmol/L (ref 22–32)
Calcium: 9.5 mg/dL (ref 8.9–10.3)
Creatinine, Ser: 0.66 mg/dL (ref 0.44–1.00)
GFR calc Af Amer: >60 mL/min (ref >60)
GFR calc non Af Amer: >60 mL/min (ref >60)
GLUCOSE: 106 mg/dL — AB (ref 65–99)
POTASSIUM: 4.1 mmol/L (ref 3.5–5.1)
Sodium: 141 mmol/L (ref 135–145)

## 2017-02-13 LAB — CBC
HCT: 45.4 % (ref 35.0–47.0)
HEMOGLOBIN: 14.9 g/dL (ref 12.0–16.0)
MCH: 29.8 pg (ref 26.0–34.0)
MCHC: 32.9 g/dL (ref 32.0–36.0)
MCV: 90.5 fL (ref 80.0–100.0)
Platelets: 221 10*3/uL (ref 150–440)
RBC: 5.01 MIL/uL (ref 3.80–5.20)
RDW: 14.4 % (ref 11.5–14.5)
WBC: 8.2 10*3/uL (ref 3.6–11.0)

## 2017-02-13 LAB — TROPONIN I: Troponin I: <0.03 ng/mL (ref <0.03)

## 2017-02-13 NOTE — ED Triage Notes (Signed)
Pt reports she has COPD and she has been keeping her grandchild that has had a cough and sore throat -  Yesterday pt started with cough and congestion and shortness of breath with coughing episodes - pt reports chest pain when coughing and cough is productive

## 2017-02-14 ENCOUNTER — Emergency Department
Admission: EM | Admit: 2017-02-14 | Discharge: 2017-02-14 | Disposition: A | Discharge status: Home / Self Care | Payer: Medicare Other | Attending: Emergency Medicine | Admitting: Emergency Medicine

## 2017-02-14 DIAGNOSIS — J40 Bronchitis, not specified as acute or chronic: Secondary | ICD-10-CM

## 2017-02-14 DIAGNOSIS — J441 Chronic obstructive pulmonary disease with (acute) exacerbation: Secondary | ICD-10-CM

## 2017-02-14 MED ORDER — HYDROCOD POLST-CPM POLST ER 10-8 MG/5ML PO SUER: 5.0000 mL | Freq: Two times a day (BID) | ORAL | 0 refills | Status: DC

## 2017-02-14 MED ORDER — IPRATROPIUM-ALBUTEROL 0.5-2.5 (3) MG/3ML IN SOLN: 3.0000 mL | RESPIRATORY_TRACT | 0 refills | Status: DC | PRN

## 2017-02-14 MED ORDER — AZITHROMYCIN 250 MG PO TABS: 250.0000 mg | ORAL_TABLET | Freq: Every day | ORAL | 0 refills | Status: DC

## 2017-02-14 MED ORDER — AZITHROMYCIN 500 MG PO TABS
500.0000 mg | ORAL_TABLET | Freq: Once | ORAL | Status: AC
  Administered 2017-02-14: 500 mg via ORAL
  Filled 2017-02-14: qty 1

## 2017-02-14 MED ORDER — PREDNISONE 20 MG PO TABS
60.0000 mg | ORAL_TABLET | Freq: Once | ORAL | Status: AC
  Administered 2017-02-14: 60 mg via ORAL

## 2017-02-14 MED ORDER — PREDNISONE 20 MG PO TABS
ORAL_TABLET | ORAL | Status: AC
  Filled 2017-02-14: qty 3

## 2017-02-14 MED ORDER — IPRATROPIUM-ALBUTEROL 0.5-2.5 (3) MG/3ML IN SOLN: 3.0000 mL | Freq: Four times a day (QID) | RESPIRATORY_TRACT | Status: DC

## 2017-02-14 MED ORDER — IPRATROPIUM-ALBUTEROL 0.5-2.5 (3) MG/3ML IN SOLN
RESPIRATORY_TRACT | Status: AC
  Filled 2017-02-14: qty 3

## 2017-02-14 MED ORDER — IPRATROPIUM-ALBUTEROL 0.5-2.5 (3) MG/3ML IN SOLN
3.0000 mL | Freq: Once | RESPIRATORY_TRACT | Status: AC
  Administered 2017-02-14: 3 mL via RESPIRATORY_TRACT

## 2017-02-14 MED ORDER — HYDROCOD POLST-CPM POLST ER 10-8 MG/5ML PO SUER
5.0000 mL | Freq: Once | ORAL | Status: AC
  Administered 2017-02-14: 5 mL via ORAL
  Filled 2017-02-14: qty 5

## 2017-02-14 MED ORDER — PREDNISONE 20 MG PO TABS: ORAL_TABLET | ORAL | 0 refills | Status: DC

## 2017-02-14 NOTE — ED Notes (Signed)

## 2017-02-14 NOTE — ED Provider Notes (Signed)
[facility] Emergency Department Provider Note   ____________________________________________   First MD Initiated Contact with Patient 02/14/17 [phone]     (approximate)  I have reviewed the triage vital signs and the nursing notes.   HISTORY  Chief Complaint Cough and Shortness of Breath    HPI Katherine Carter is a 66 y.o. female who presents to the ED from home with a chief complaint of cough, sore throat and wheezing. Patient has a history of COPD with prior hospitalizations, never intubation, who reports symptoms starting yesterday. She kept her grandchild over the weekend who was sick with cold-like symptoms. Yesterday patient started with cough productive of white sputum, nasal congestion, sore throat, wheezing, shortness of breath and chest discomfort upon coughing. Denies associated fever, chills, abdominal pain, nausea, vomiting, diarrhea. Denies recent travel or trauma. Has been taking her nebulizer with partial relief of symptoms.   Past Medical History:  Diagnosis Date  . COPD (chronic obstructive pulmonary disease) (HCC)   . Diabetes mellitus without complication (HCC)   . Hypertension     Patient Active Problem List   Diagnosis Date Noted  . Acute respiratory failure (HCC) 12/10/2015    Past Surgical History:  Procedure Laterality Date  . ABDOMINAL HYSTERECTOMY    . COLONOSCOPY WITH PROPOFOL N/A 01/23/2017   Procedure: COLONOSCOPY WITH PROPOFOL;  Surgeon: Christena Deem, MD;  Location: [facility] ENDOSCOPY;  Service: Endoscopy;  Laterality: N/A;  . FOOT SURGERY      Prior to Admission medications   Medication Sig Start Date End Date Taking? Authorizing Provider  acetaminophen (TYLENOL) 325 MG tablet Take 2 tablets (650 mg total) by mouth every 6 (six) hours as needed for mild pain (or Fever >/= 101). 12/13/15   Ramonita Lab, MD  aspirin EC 81 MG tablet Take 81 mg by mouth daily.    [provider]  azithromycin  (ZITHROMAX) 250 MG tablet Take 1 tablet (250 mg total) by mouth daily. 02/14/17   Irean Hong, MD  chlorpheniramine-HYDROcodone [facility] PENNKINETIC ER) 10-8 MG/5ML SUER Take 5 mLs by mouth 2 (two) times daily. 02/14/17   Irean Hong, MD  diclofenac (VOLTAREN) 75 MG EC tablet Take 75 mg by mouth 2 (two) times daily as needed.    [provider]  fexofenadine-pseudoephedrine (ALLEGRA-D ALLERGY & CONGESTION) 180-240 MG 24 hr tablet Take 1 tablet by mouth daily. 12/07/15   Hassan Rowan, MD  levalbuterol [facility] HFA) 45 MCG/ACT inhaler Inhale 2 puffs into the lungs every 4 (four) hours as needed for wheezing.    [provider]  metFORMIN (GLUCOPHAGE-XR) 500 MG 24 hr tablet Take 500 mg by mouth daily with breakfast.    [provider]  mometasone-formoterol (DULERA) 100-5 MCG/ACT AERO Inhale 1 puff into the lungs 2 (two) times daily.    [provider]  predniSONE (DELTASONE) 20 MG tablet 3 tablets daily x 4 days 02/14/17   Irean Hong, MD  tiotropium (SPIRIVA HANDIHALER) 18 MCG inhalation capsule Place 1 capsule (18 mcg total) into inhaler and inhale daily. 12/13/15   Ramonita Lab, MD    Allergies Penicillins  Family History  Problem Relation Age of Onset  . CAD Mother   . CAD Father     Social History Social History  Substance Use Topics  . Smoking status: Former Smoker    Packs/day: 0.50    Years: 25.00    Types: Cigarettes    Quit date: 07/06/2014  . Smokeless tobacco: Never Used  .  Alcohol use No    Review of Systems  Constitutional: No fever/chills. Eyes: No visual changes. ENT: Positive for nasal congestion and sore throat. Cardiovascular: Positive for chest pain on coughing. Respiratory: Positive for wheezing and shortness of breath. Gastrointestinal: No abdominal pain.  No nausea, no vomiting.  No diarrhea.  No constipation. Genitourinary: Negative for dysuria. Musculoskeletal: Negative for back pain. Skin: Negative for  rash. Neurological: Negative for headaches, focal weakness or numbness.   ____________________________________________   PHYSICAL EXAM:  VITAL SIGNS: ED Triage Vitals  Enc Vitals Group     BP 02/13/17 2250 (!) 171/85     Pulse Rate 02/13/17 2250 78     Resp 02/13/17 2250 18     Temp 02/13/17 2250 97.8 F (36.6 C)     Temp Source 02/13/17 2250 Oral     SpO2 02/13/17 2250 95 %     Weight 02/13/17 2251 140 lb (63.5 kg)     Height 02/13/17 2251 5\' 3"  (1.6 m)     Head Circumference --      Peak Flow --      Pain Score 02/13/17 2250 0     Pain Loc --      Pain Edu? --      Excl. in GC? --    Examined after nebulizer treatment: Constitutional: Alert and oriented. Well appearing and in no acute distress. Eyes: Conjunctivae are normal. PERRL. EOMI. Head: Atraumatic. Nose: Congestion/rhinnorhea. Mouth/Throat: Mucous membranes are moist.  Oropharynx slightly erythematous without tonsillar swelling, exudates or peritonsillar abscess. There is no hoarse or muffled voice. There is no drooling. Neck: No stridor.  Supple neck without meningismus. Hematological/Lymphatic/Immunilogical: No cervical lymphadenopathy. Cardiovascular: Normal rate, regular rhythm. Grossly normal heart sounds.  Good peripheral circulation. Respiratory: Normal respiratory effort.  No retractions. Lungs with scattered wheezing. Gastrointestinal: Soft and nontender. No distention. No abdominal bruits. No CVA tenderness. Musculoskeletal: No lower extremity tenderness nor edema.  No joint effusions. Neurologic:  Normal speech and language. No gross focal neurologic deficits are appreciated. No gait instability. Skin:  Skin is warm, dry and intact. No rash noted. No petechiae. Psychiatric: Mood and affect are normal. Speech and behavior are normal.  ____________________________________________   LABS (all labs ordered are listed, but only abnormal results are displayed)  Labs Reviewed  BASIC METABOLIC PANEL -  Abnormal; Notable for the following:       Result Value   Glucose, Bld 106 (*)    All other components within normal limits  CBC  TROPONIN I   ____________________________________________  EKG  ED ECG REPORT I, Xochilt Conant J, the attending physician, personally viewed and interpreted this ECG.   Date: 02/14/2017  EKG Time: 2256  Rate: 73  Rhythm: normal EKG, normal sinus rhythm  Axis: Normal  Intervals:none  ST&T Change: Nonspecific  ____________________________________________  RADIOLOGY  Dg Chest 2 View  Result Date: 02/13/2017 CLINICAL DATA:  Cough and sore throat EXAM: CHEST  2 VIEW COMPARISON:  12/10/2015 FINDINGS: Hyperinflation with emphysematous disease. Streaky interstitial opacities at both lung bases could relate to acute on chronic bronchial inflammation. No focal consolidation. No effusion. Stable cardiomediastinal silhouette with atherosclerosis. No pneumothorax. Degenerative changes of the spine. Surgical clips in the right upper quadrant. IMPRESSION: Emphysematous disease. Prominent interstitial and streaky opacities in the lower lung zones suspected to represent acute inflammation on chronic bronchial thickening. Electronically Signed   By: Jasmine Pang M.D.   On: 02/13/2017 23:37    ____________________________________________   PROCEDURES  Procedure(s) performed: None  Procedures  Critical Care performed: No  ____________________________________________   INITIAL IMPRESSION / ASSESSMENT AND PLAN / ED COURSE  Pertinent labs & imaging results that were available during my care of the patient were reviewed by me and considered in my medical decision making (see chart for details).  66 year old female with COPD who presents with cold-like symptoms and exacerbation. Will treat with prednisone, nebulizer treatment, Tussionex and Z-Pak.  Clinical Course as of Feb 14 318  Wed Feb 14, 2017  0208 Patient resting in no acute distress. Room air saturations  95%. Wheezing remains. Will administer second DuoNeb.  [JS]  [id number] Patient states she is feeling much better. Room air saturations 93-94%. Some wheezing remains. I did offer hospitalization but patient states she would rather go home. States she has oxygen at home to use as needed. Strict return precautions given. Patient and spouse verbalize understanding and agree with plan of care.  [JS]    Clinical Course User Index [JS] Irean HongSung, Loula Marcella J, MD     ____________________________________________   FINAL CLINICAL IMPRESSION(S) / ED DIAGNOSES  Final diagnoses:  COPD exacerbation (HCC)  Bronchitis      NEW MEDICATIONS STARTED DURING THIS VISIT:  New Prescriptions   AZITHROMYCIN (ZITHROMAX) 250 MG TABLET    Take 1 tablet (250 mg total) by mouth daily.   CHLORPHENIRAMINE-HYDROCODONE (TUSSIONEX PENNKINETIC ER) 10-8 MG/5ML SUER    Take 5 mLs by mouth 2 (two) times daily.   PREDNISONE (DELTASONE) 20 MG TABLET    3 tablets daily x 4 days     Note:  This document was prepared using Dragon voice recognition software and may include unintentional dictation errors.    Irean HongSung, Marigold Mom J, MD 02/14/17 [phone]

## 2017-02-14 NOTE — Discharge Instructions (Addendum)
1. Finish antibiotics as prescribed (azithromycin 250 mg daily 4 days). 2. Finish steroid as prescribed (prednisone 60 mg daily 4 days). 3. You may take Tussionex as needed for cough. 4. Continue albuterol inhaler and/or nebulizer every 4 hours as needed for wheezing. 5. Return to the ER for worsening symptoms, persistent vomiting, difficulty breathing or other concerns.

## 2017-08-01 ENCOUNTER — Other Ambulatory Visit: Payer: Self-pay

## 2017-08-01 ENCOUNTER — Ambulatory Visit
Admission: EM | Admit: 2017-08-01 | Discharge: 2017-08-01 | Disposition: A | Discharge status: Home / Self Care | Payer: Medicare Other | Attending: Family Medicine | Admitting: Family Medicine

## 2017-08-01 DIAGNOSIS — R05 Cough: Secondary | ICD-10-CM | POA: Diagnosis not present

## 2017-08-01 DIAGNOSIS — R062 Wheezing: Secondary | ICD-10-CM

## 2017-08-01 DIAGNOSIS — J441 Chronic obstructive pulmonary disease with (acute) exacerbation: Secondary | ICD-10-CM | POA: Diagnosis not present

## 2017-08-01 MED ORDER — PREDNISONE 50 MG PO TABS
60.0000 mg | ORAL_TABLET | Freq: Once | ORAL | Status: AC
  Administered 2017-08-01: 60 mg via ORAL

## 2017-08-01 MED ORDER — DOXYCYCLINE HYCLATE 100 MG PO TABS: 100.0000 mg | ORAL_TABLET | Freq: Two times a day (BID) | ORAL | 0 refills | Status: DC

## 2017-08-01 MED ORDER — BENZONATATE 100 MG PO CAPS: 100.0000 mg | ORAL_CAPSULE | Freq: Three times a day (TID) | ORAL | 0 refills | Status: DC | PRN

## 2017-08-01 MED ORDER — PREDNISONE 20 MG PO TABS: ORAL_TABLET | ORAL | 0 refills | Status: DC

## 2017-08-01 MED ORDER — IPRATROPIUM-ALBUTEROL 0.5-2.5 (3) MG/3ML IN SOLN
3.0000 mL | Freq: Once | RESPIRATORY_TRACT | Status: AC
  Administered 2017-08-01: 3 mL via RESPIRATORY_TRACT

## 2017-08-01 NOTE — ED Triage Notes (Signed)
Patient complains of cough, wheezing x Saturday. Patient states that she did take Theraflu.

## 2017-08-01 NOTE — ED Provider Notes (Signed)
[facility]    CSN: [id number] Arrival date & time: 08/01/17  1620     History   Chief Complaint Chief Complaint  Patient presents with  . Cough    HPI Katherine Carter is a 66 y.o. female.   The history is provided by the patient.  URI  Presenting symptoms: congestion and cough   Severity:  Moderate Onset quality:  Sudden Duration:  6 days Timing:  Constant Progression:  Worsening Chronicity:  Recurrent Relieved by:  Nebulizer treatments Risk factors: being elderly, chronic respiratory disease (copd) and diabetes mellitus   Risk factors: no chronic cardiac disease, no chronic kidney disease, no immunosuppression, no recent illness and no recent travel     Past Medical History:  Diagnosis Date  . COPD (chronic obstructive pulmonary disease) (HCC)   . Diabetes mellitus without complication (HCC)   . Hypertension     Patient Active Problem List   Diagnosis Date Noted  . Acute respiratory failure (HCC) 12/10/2015    Past Surgical History:  Procedure Laterality Date  . ABDOMINAL HYSTERECTOMY    . COLONOSCOPY WITH PROPOFOL N/A 01/23/2017   Procedure: COLONOSCOPY WITH PROPOFOL;  Surgeon: Christena Deem, MD;  Location: [facility] ENDOSCOPY;  Service: Endoscopy;  Laterality: N/A;  . FOOT SURGERY      OB History    No data available       Home Medications    Prior to Admission medications   Medication Sig Start Date End Date Taking? Authorizing Provider  acetaminophen (TYLENOL) 325 MG tablet Take 2 tablets (650 mg total) by mouth every 6 (six) hours as needed for mild pain (or Fever >/= 101). 12/13/15  Yes Gouru, Deanna Artis, MD  aspirin EC 81 MG tablet Take 81 mg by mouth daily.   Yes [provider]  diclofenac (VOLTAREN) 75 MG EC tablet Take 75 mg by mouth 2 (two) times daily as needed.   Yes [provider]  fexofenadine-pseudoephedrine (ALLEGRA-D ALLERGY & CONGESTION) 180-240 MG 24 hr tablet Take 1 tablet by mouth daily. 12/07/15   Yes Hassan Rowan, MD  ipratropium-albuterol (DUONEB) 0.5-2.5 (3) MG/3ML SOLN Take 3 mLs by nebulization every 4 (four) hours as needed. 02/14/17  Yes Irean Hong, MD  metFORMIN (GLUCOPHAGE-XR) 500 MG 24 hr tablet Take 500 mg by mouth daily with breakfast.   Yes [provider]  mometasone-formoterol (DULERA) 100-5 MCG/ACT AERO Inhale 1 puff into the lungs 2 (two) times daily.   Yes [provider]  tiotropium (SPIRIVA HANDIHALER) 18 MCG inhalation capsule Place 1 capsule (18 mcg total) into inhaler and inhale daily. 12/13/15  Yes Gouru, Deanna Artis, MD  benzonatate (TESSALON) 100 MG capsule Take 1 capsule (100 mg total) by mouth 3 (three) times daily as needed. 08/01/17   Payton Mccallum, MD  chlorpheniramine-HYDROcodone (TUSSIONEX PENNKINETIC ER) 10-8 MG/5ML SUER Take 5 mLs by mouth 2 (two) times daily. 02/14/17   Irean Hong, MD  doxycycline (VIBRA-TABS) 100 MG tablet Take 1 tablet (100 mg total) by mouth 2 (two) times daily. 08/01/17   Payton Mccallum, MD  levalbuterol [facility] HFA) 45 MCG/ACT inhaler Inhale 2 puffs into the lungs every 4 (four) hours as needed for wheezing.    [provider]  predniSONE (DELTASONE) 20 MG tablet 2 tabs po qd for 3 days, then 1 tab po qd x 3 days, then half a tab po qd for 2 days 08/01/17   Payton Mccallum, MD    Family History Family History  Problem Relation Age  of Onset  . CAD Mother   . CAD Father     Social History Social History   Tobacco Use  . Smoking status: Former Smoker    Packs/day: 0.50    Years: 25.00    Pack years: 12.50    Types: Cigarettes    Last attempt to quit: 07/06/2014    Years since quitting: 3.0  . Smokeless tobacco: Never Used  Substance Use Topics  . Alcohol use: No    Alcohol/week: 0.0 oz  . Drug use: No     Allergies   Penicillins   Review of Systems Review of Systems  HENT: Positive for congestion.   Respiratory: Positive for cough.      Physical Exam Triage Vital Signs ED Triage  Vitals  Enc Vitals Group     BP 08/01/17 1645 137/79     Pulse Rate 08/01/17 1645 (!) 120     Resp 08/01/17 1645 (!) 21     Temp 08/01/17 1645 99.8 F (37.7 C)     Temp Source 08/01/17 1645 Oral     SpO2 08/01/17 1645 94 %     Weight 08/01/17 1643 160 lb (72.6 kg)     Height 08/01/17 1643 5\' 3"  (1.6 m)     Head Circumference --      Peak Flow --      Pain Score 08/01/17 1644 5     Pain Loc --      Pain Edu? --      Excl. in GC? --    No data found.  Updated Vital Signs BP 137/79 (BP Location: Left Arm)   Pulse (!) 120   Temp 99.8 F (37.7 C) (Oral)   Resp (!) 21   Ht 5\' 3"  (1.6 m)   Wt 160 lb (72.6 kg)   SpO2 94%   BMI 28.34 kg/m   Visual Acuity Right Eye Distance:   Left Eye Distance:   Bilateral Distance:    Right Eye Near:   Left Eye Near:    Bilateral Near:     Physical Exam  Constitutional: She appears well-developed and well-nourished. No distress.  HENT:  Head: Normocephalic and atraumatic.  Right Ear: Tympanic membrane, external ear and ear canal normal.  Left Ear: Tympanic membrane, external ear and ear canal normal.  Nose: No mucosal edema, rhinorrhea, nose lacerations, sinus tenderness, nasal deformity, septal deviation or nasal septal hematoma. No epistaxis.  No foreign bodies. Right sinus exhibits no maxillary sinus tenderness and no frontal sinus tenderness. Left sinus exhibits no maxillary sinus tenderness and no frontal sinus tenderness.  Mouth/Throat: Uvula is midline, oropharynx is clear and moist and mucous membranes are normal. No oropharyngeal exudate.  Eyes: Conjunctivae and EOM are normal. Pupils are equal, round, and reactive to light. Right eye exhibits no discharge. Left eye exhibits no discharge. No scleral icterus.  Neck: Normal range of motion. Neck supple. No thyromegaly present.  Cardiovascular: Normal rate, regular rhythm and normal heart sounds.  Pulmonary/Chest: Effort normal. No respiratory distress. She has wheezes (diffuse plus  rhonchi). She has no rales.  Lymphadenopathy:    She has no cervical adenopathy.  Skin: She is not diaphoretic.  Nursing note and vitals reviewed.    UC Treatments / Results  Labs (all labs ordered are listed, but only abnormal results are displayed) Labs Reviewed - No data to display  EKG  EKG Interpretation None       Radiology No results found.  Procedures Procedures (including critical care  time)  Medications Ordered in UC Medications  ipratropium-albuterol (DUONEB) 0.5-2.5 (3) MG/3ML nebulizer solution 3 mL (3 mLs Nebulization Given 08/01/17 1717)  predniSONE (DELTASONE) tablet 60 mg (60 mg Oral Given 08/01/17 1717)     Initial Impression / Assessment and Plan / UC Course  I have reviewed the triage vital signs and the nursing notes.  Pertinent labs & imaging results that were available during my care of the patient were reviewed by me and considered in my medical decision making (see chart for details).       Final Clinical Impressions(s) / UC Diagnoses   Final diagnoses:  COPD exacerbation [facility](HCC)    ED Discharge Orders        Ordered    predniSONE (DELTASONE) 20 MG tablet     08/01/17 1735    doxycycline (VIBRA-TABS) 100 MG tablet  2 times daily     08/01/17 1735    benzonatate (TESSALON) 100 MG capsule  3 times daily PRN     08/01/17 1735     1. diagnosis reviewed with patient 2. Patient given duoneb x1 and prednisone 60mg  po x 1  3. rx as per orders above; reviewed possible side effects, interactions, risks and benefits  3. Recommend continue current home inhalers 4. Follow-up prn if symptoms worsen or don't improve Controlled Substance Prescriptions [place] Controlled Substance Registry consulted? Not applicable   Payton Mccallumonty, Sandy Blouch, MD 08/01/17 [phone]

## 2017-08-20 ENCOUNTER — Other Ambulatory Visit: Payer: Self-pay | Admitting: Family Medicine

## 2017-08-20 DIAGNOSIS — Z1231 Encounter for screening mammogram for malignant neoplasm of breast: Secondary | ICD-10-CM

## 2017-09-13 ENCOUNTER — Ambulatory Visit
Admission: RE | Admit: 2017-09-13 | Discharge: 2017-09-13 | Disposition: A | Discharge status: Home / Self Care | Payer: Medicare Other | Source: Ambulatory Visit | Attending: Family Medicine | Admitting: Family Medicine

## 2017-09-13 DIAGNOSIS — Z1231 Encounter for screening mammogram for malignant neoplasm of breast: Secondary | ICD-10-CM | POA: Diagnosis present

## 2017-10-21 ENCOUNTER — Ambulatory Visit
Admission: EM | Admit: 2017-10-21 | Discharge: 2017-10-21 | Disposition: A | Discharge status: Home / Self Care | Payer: Medicare Other | Attending: Family Medicine | Admitting: Family Medicine

## 2017-10-21 ENCOUNTER — Other Ambulatory Visit: Payer: Self-pay

## 2017-10-21 DIAGNOSIS — J111 Influenza due to unidentified influenza virus with other respiratory manifestations: Secondary | ICD-10-CM

## 2017-10-21 DIAGNOSIS — R05 Cough: Secondary | ICD-10-CM

## 2017-10-21 DIAGNOSIS — R0981 Nasal congestion: Secondary | ICD-10-CM

## 2017-10-21 DIAGNOSIS — J441 Chronic obstructive pulmonary disease with (acute) exacerbation: Secondary | ICD-10-CM

## 2017-10-21 DIAGNOSIS — R062 Wheezing: Secondary | ICD-10-CM | POA: Diagnosis not present

## 2017-10-21 DIAGNOSIS — R69 Illness, unspecified: Secondary | ICD-10-CM

## 2017-10-21 DIAGNOSIS — R509 Fever, unspecified: Secondary | ICD-10-CM

## 2017-10-21 LAB — RAPID INFLUENZA A&B ANTIGENS (ARMC ONLY)
INFLUENZA A (ARMC): NEGATIVE
INFLUENZA B (ARMC): NEGATIVE

## 2017-10-21 MED ORDER — BENZONATATE 100 MG PO CAPS: 100.0000 mg | ORAL_CAPSULE | Freq: Three times a day (TID) | ORAL | 0 refills | Status: DC | PRN

## 2017-10-21 MED ORDER — AZITHROMYCIN 500 MG PO TABS: 500.0000 mg | ORAL_TABLET | Freq: Every day | ORAL | 0 refills | Status: DC

## 2017-10-21 MED ORDER — PREDNISONE 20 MG PO TABS: ORAL_TABLET | ORAL | 0 refills | Status: DC

## 2017-10-21 NOTE — ED Triage Notes (Signed)
Patient complains of cough, congestion, back pressure, ear pain, wheezing. Patient states that she has COPD and has used her inhalers.

## 2017-10-21 NOTE — ED Provider Notes (Signed)
[facility]    CSN: [id number] Arrival date & time: 10/21/17  1546     History   Chief Complaint Chief Complaint  Patient presents with  . Cough    HPI Katherine Carter is a 67 y.o. female.   66 YR OLD AA FEMALE PRESENTS TO UC WITH CC OF MY COPD IS ACTING UP, HAS FEVER, WORKS AT [employer], + SICK CONTACTS.   The history is provided by the patient. No language interpreter was used.    Past Medical History:  Diagnosis Date  . COPD (chronic obstructive pulmonary disease) (HCC)   . Diabetes mellitus without complication (HCC)   . Hypertension     Patient Active Problem List   Diagnosis Date Noted  . Acute respiratory failure (HCC) 12/10/2015    Past Surgical History:  Procedure Laterality Date  . ABDOMINAL HYSTERECTOMY    . COLONOSCOPY WITH PROPOFOL N/A 01/23/2017   Procedure: COLONOSCOPY WITH PROPOFOL;  Surgeon: Christena Deem, MD;  Location: [facility] ENDOSCOPY;  Service: Endoscopy;  Laterality: N/A;  . FOOT SURGERY      OB History    No data available       Home Medications    Prior to Admission medications   Medication Sig Start Date End Date Taking? Authorizing Provider  acetaminophen (TYLENOL) 325 MG tablet Take 2 tablets (650 mg total) by mouth every 6 (six) hours as needed for mild pain (or Fever >/= 101). 12/13/15  Yes Gouru, Deanna Artis, MD  aspirin EC 81 MG tablet Take 81 mg by mouth daily.   Yes [provider]  diclofenac (VOLTAREN) 75 MG EC tablet Take 75 mg by mouth 2 (two) times daily as needed.   Yes [provider]  fexofenadine-pseudoephedrine (ALLEGRA-D ALLERGY & CONGESTION) 180-240 MG 24 hr tablet Take 1 tablet by mouth daily. 12/07/15  Yes Hassan Rowan, MD  ipratropium-albuterol (DUONEB) 0.5-2.5 (3) MG/3ML SOLN Take 3 mLs by nebulization every 4 (four) hours as needed. 02/14/17  Yes Irean Hong, MD  levalbuterol [facility] HFA) 45 MCG/ACT inhaler Inhale 2 puffs into the lungs every 4 (four) hours as needed for  wheezing.   Yes [provider]  metFORMIN (GLUCOPHAGE-XR) 500 MG 24 hr tablet Take 500 mg by mouth daily with breakfast.   Yes [provider]  mometasone-formoterol (DULERA) 100-5 MCG/ACT AERO Inhale 1 puff into the lungs 2 (two) times daily.   Yes [provider]  tiotropium (SPIRIVA HANDIHALER) 18 MCG inhalation capsule Place 1 capsule (18 mcg total) into inhaler and inhale daily. 12/13/15  Yes Gouru, Deanna Artis, MD  azithromycin (ZITHROMAX) 500 MG tablet Take 1 tablet (500 mg total) by mouth daily. 10/21/17   Natanel Snavely, Para March, NP  benzonatate (TESSALON) 100 MG capsule Take 1 capsule (100 mg total) by mouth 3 (three) times daily as needed. 10/21/17   Kolby Schara, Para March, NP  predniSONE (DELTASONE) 20 MG tablet 2 tabs po qd for 3 days, then 1 tab po qd x 3 days, then half a tab po qd for 2 days 10/21/17   Makail Watling, Para March, NP    Family History Family History  Problem Relation Age of Onset  . CAD Mother   . CAD Father   . Breast cancer Neg Hx     Social History Social History   Tobacco Use  . Smoking status: Former Smoker    Packs/day: 0.50    Years: 25.00    Pack years: 12.50    Types: Cigarettes    Last attempt  to quit: 07/06/2014    Years since quitting: 3.2  . Smokeless tobacco: Never Used  Substance Use Topics  . Alcohol use: No    Alcohol/week: 0.0 oz  . Drug use: No     Allergies   Penicillins   Review of Systems Review of Systems  Constitutional: Positive for fever. Negative for chills.  HENT: Positive for congestion, sinus pressure and sneezing. Negative for ear pain and rhinorrhea.   Eyes: Negative.   Respiratory: Positive for cough and wheezing. Negative for shortness of breath.   Cardiovascular: Negative for chest pain and palpitations.  Gastrointestinal: Negative for nausea and vomiting.  Endocrine: Negative.   Genitourinary: Negative for dysuria.  Musculoskeletal: Negative for myalgias.  Skin: Negative for rash.    Allergic/Immunologic: Negative.   Neurological: Negative for headaches.  Hematological: Negative.   Psychiatric/Behavioral: Negative.   All other systems reviewed and are negative.    Physical Exam Triage Vital Signs ED Triage Vitals  Enc Vitals Group     BP 10/21/17 1614 128/71     Pulse Rate 10/21/17 1614 (!) 105     Resp 10/21/17 1614 18     Temp 10/21/17 1614 99.8 F (37.7 C)     Temp Source 10/21/17 1614 Oral     SpO2 10/21/17 1614 95 %     Weight 10/21/17 1612 140 lb (63.5 kg)     Height 10/21/17 1612 5\' 3"  (1.6 m)     Head Circumference --      Peak Flow --      Pain Score 10/21/17 1611 10     Pain Loc --      Pain Edu? --      Excl. in GC? --    No data found.  Updated Vital Signs BP 128/71 (BP Location: Left Arm)   Pulse (!) 105   Temp 99.8 F (37.7 C) (Oral)   Resp 18   Ht 5\' 3"  (1.6 m)   Wt 140 lb (63.5 kg)   SpO2 95%   BMI 24.80 kg/m   Visual Acuity Right Eye Distance:   Left Eye Distance:   Bilateral Distance:    Right Eye Near:   Left Eye Near:    Bilateral Near:     Physical Exam  Constitutional: She is oriented to person, place, and time. She appears well-developed and well-nourished. She is active and cooperative. No distress.  HENT:  Head: Normocephalic.  Mouth/Throat: Oropharynx is clear and moist.  Eyes: Conjunctivae, EOM and lids are normal. Pupils are equal, round, and reactive to light.  Neck: Normal range of motion. No tracheal deviation present.  Cardiovascular: Regular rhythm, normal heart sounds and normal pulses.  No murmur heard. Pulmonary/Chest: Effort normal. She has wheezes in the right upper field, the right lower field, the left upper field and the left lower field.  MILD EXP WHEEZES  Abdominal: Soft. Bowel sounds are normal. There is no tenderness.  Musculoskeletal: Normal range of motion.  Lymphadenopathy:    She has no cervical adenopathy.  Neurological: She is alert and oriented to person, place, and time. GCS  eye subscore is 4. GCS verbal subscore is 5. GCS motor subscore is 6.  Skin: Skin is warm and dry. No rash noted.  Psychiatric: She has a normal mood and affect. Her speech is normal and behavior is normal.  Nursing note and vitals reviewed.    UC Treatments / Results  Labs (all labs ordered are listed, but only abnormal results are displayed) Labs  Reviewed  RAPID INFLUENZA A&B ANTIGENS ([facility] ONLY)    EKG  EKG Interpretation None       Radiology No results found.  Procedures Procedures (including critical care time)  Medications Ordered in UC Medications - No data to display   Initial Impression / Assessment and Plan / UC Course  I have reviewed the triage vital signs and the nursing notes.  Pertinent labs & imaging results that were available during my care of the patient were reviewed by me and considered in my medical decision making (see chart for details).   I am treating you for acute respiratory illness given your exam, vital signs and hx of COPD. YOUR FLU TEST WAS NEGATIVE.TAKE MEDS AS DIRECTED FOR URI/COPD FLARE. WATCH YOUR BLOOD SUGAR AS THEY MAY GO UP WITH ILLNESS AND PREDNISONE. GO TO ER FOR CHEST PAIN,SHORTNESS OF BREATH OR DIFFICULTY BREATHING. FOLLOW UP WITH PCP;RETURN TO UC AS NEEDED. PT VERBALIZED UNDERSTANDING TO THIS PROVIDER.   Final Clinical Impressions(s) / UC Diagnoses   Final diagnoses:  Influenza-like illness  COPD exacerbation [facility](HCC)    ED Discharge Orders        Ordered    benzonatate (TESSALON) 100 MG capsule  3 times daily PRN     10/21/17 1642    predniSONE (DELTASONE) 20 MG tablet     10/21/17 1642    azithromycin (ZITHROMAX) 500 MG tablet  Daily     10/21/17 1642       Controlled Substance Prescriptions    Lahari Suttles, Para MarchJeanette, NP 10/21/17 1735

## 2017-10-21 NOTE — Discharge Instructions (Signed)
YOUR FLU TEST WAS NEGATIVE.TAKE MEDS AS DIRECTED FOR URI/COPD FLARE. WATCH YOUR BLOOD SUGAR AS THEY MAY GO UP WITH ILLNESS AND PREDNISONE. GO TO ER FOR CHEST PAIN,SHORTNESS OF BREATH OR DIFFICULTY BREATHING. FOLLOW UP WITH PCP;RETURN TO UC AS NEEDED.

## 2017-10-25 ENCOUNTER — Ambulatory Visit
Admission: EM | Admit: 2017-10-25 | Discharge: 2017-10-25 | Disposition: A | Discharge status: Home / Self Care | Payer: Medicare Other | Attending: Emergency Medicine | Admitting: Emergency Medicine

## 2017-10-25 ENCOUNTER — Other Ambulatory Visit: Payer: Self-pay

## 2017-10-25 DIAGNOSIS — R05 Cough: Secondary | ICD-10-CM | POA: Diagnosis not present

## 2017-10-25 DIAGNOSIS — J441 Chronic obstructive pulmonary disease with (acute) exacerbation: Secondary | ICD-10-CM

## 2017-10-25 DIAGNOSIS — R0602 Shortness of breath: Secondary | ICD-10-CM | POA: Diagnosis not present

## 2017-10-25 MED ORDER — IPRATROPIUM-ALBUTEROL 0.5-2.5 (3) MG/3ML IN SOLN
3.0000 mL | Freq: Once | RESPIRATORY_TRACT | Status: AC
  Administered 2017-10-25: 3 mL via RESPIRATORY_TRACT

## 2017-10-25 MED ORDER — HYDROCOD POLST-CPM POLST ER 10-8 MG/5ML PO SUER: 5.0000 mL | Freq: Two times a day (BID) | ORAL | 0 refills | Status: DC | PRN

## 2017-10-25 NOTE — ED Provider Notes (Signed)
HPI  SUBJECTIVE:  Katherine Carter is a 67 y.o. female who presents with worsening cough.  She reports shortness of breath with coughing, but denies worsening shortness of breath at rest or dyspnea on exertion.  She states that she does not feel like she is getting worse overall, but is wanting an additional cough medicine because she cannot sleep at night. Patient was seen here 4 days ago.  Rapid flu negative.  She was treated for COPD exacerbation with prednisone, Tessalon and azithromycin 500 mg daily. She reports continued wheezing.  She is however requiring her Xopenex and DuoNeb more frequently because of the cough, not because of shortness of breath. She describes a cough productive of clear, occasionally yellow sputum-is baseline for her, denies change in amount of sputum.  States that she felt feverish on Sunday but had no documented temperatures above 100.4.  She denies chest pain.  She has been taking the prednisone, Tessalon, azithromycin, Xopenex, DuoNeb with improvement in her symptoms.  No aggravating factors.  She has a past medical history of diabetes, COPD, last admission "years ago".  No intubations.  She is not on any supplemental oxygen.  WUJ:WJXBJY, Sylvie Farrier, MD Pulmonology: Dr. Meredeth Ide.    Past Medical History:  Diagnosis Date  . COPD (chronic obstructive pulmonary disease) (HCC)   . Diabetes mellitus without complication (HCC)   . Hypertension     Past Surgical History:  Procedure Laterality Date  . ABDOMINAL HYSTERECTOMY    . COLONOSCOPY WITH PROPOFOL N/A 01/23/2017   Procedure: COLONOSCOPY WITH PROPOFOL;  Surgeon: Christena Deem, MD;  Location: [facility] ENDOSCOPY;  Service: Endoscopy;  Laterality: N/A;  . FOOT SURGERY      Family History  Problem Relation Age of Onset  . CAD Mother   . CAD Father   . Breast cancer Neg Hx     Social History   Tobacco Use  . Smoking status: Former Smoker    Packs/day: 0.50    Years: 25.00    Pack years: 12.50   Types: Cigarettes    Last attempt to quit: 07/06/2014    Years since quitting: 3.3  . Smokeless tobacco: Never Used  Substance Use Topics  . Alcohol use: No    Alcohol/week: 0.0 oz  . Drug use: No    No current facility-administered medications for this encounter.   Current Outpatient Medications:  .  acetaminophen (TYLENOL) 325 MG tablet, Take 2 tablets (650 mg total) by mouth every 6 (six) hours as needed for mild pain (or Fever >/= 101)., Disp: , Rfl:  .  aspirin EC 81 MG tablet, Take 81 mg by mouth daily., Disp: , Rfl:  .  azithromycin (ZITHROMAX) 500 MG tablet, Take 1 tablet (500 mg total) by mouth daily., Disp: 5 tablet, Rfl: 0 .  benzonatate (TESSALON) 100 MG capsule, Take 1 capsule (100 mg total) by mouth 3 (three) times daily as needed., Disp: 21 capsule, Rfl: 0 .  diclofenac (VOLTAREN) 75 MG EC tablet, Take 75 mg by mouth 2 (two) times daily as needed., Disp: , Rfl:  .  fexofenadine-pseudoephedrine (ALLEGRA-D ALLERGY & CONGESTION) 180-240 MG 24 hr tablet, Take 1 tablet by mouth daily., Disp: 30 tablet, Rfl: 0 .  ipratropium-albuterol (DUONEB) 0.5-2.5 (3) MG/3ML SOLN, Take 3 mLs by nebulization every 4 (four) hours as needed., Disp: 360 mL, Rfl: 0 .  levalbuterol (XOPENEX HFA) 45 MCG/ACT inhaler, Inhale 2 puffs into the lungs every 4 (four) hours as needed for wheezing., Disp: , Rfl:  .  metFORMIN (GLUCOPHAGE-XR) 500 MG 24 hr tablet, Take 500 mg by mouth daily with breakfast., Disp: , Rfl:  .  mometasone-formoterol (DULERA) 100-5 MCG/ACT AERO, Inhale 1 puff into the lungs 2 (two) times daily., Disp: , Rfl:  .  predniSONE (DELTASONE) 20 MG tablet, 2 tabs po qd for 3 days, then 1 tab po qd x 3 days, then half a tab po qd for 2 days, Disp: 10 tablet, Rfl: 0 .  tiotropium (SPIRIVA HANDIHALER) 18 MCG inhalation capsule, Place 1 capsule (18 mcg total) into inhaler and inhale daily., Disp: 30 capsule, Rfl: 0 .  chlorpheniramine-HYDROcodone (TUSSIONEX PENNKINETIC ER) 10-8 MG/5ML SUER, Take 5  mLs by mouth every 12 (twelve) hours as needed for cough., Disp: 120 mL, Rfl: 0  Allergies  Allergen Reactions  . Penicillins Hives     ROS  As noted in HPI.   Physical Exam  BP (!) 142/78 (BP Location: Left Arm)   Pulse 100   Temp 98.2 F (36.8 C) (Oral)   Resp 18   Ht 5\' 3"  (1.6 m)   Wt 140 lb (63.5 kg)   SpO2 92%   BMI 24.80 kg/m      Constitutional: Well developed, well nourished, no acute distress Eyes:  EOMI, conjunctiva normal bilaterally HENT: Normocephalic, atraumatic,mucus membranes moist Respiratory: Normal inspiratory effort, fair air movement, diffuse wheezing throughout all lung fields, prolonged expiratory phase. no rales, rhonchi Cardiovascular: Normal rate regular rhythm, no murmurs, rubs, gallops GI: nondistended skin: No rash, skin intact Musculoskeletal: no deformities calves symmetric, nontender, no edema Neurologic: Alert & oriented x 3, no focal neuro deficits Psychiatric: Speech and behavior appropriate   ED Course   Medications  ipratropium-albuterol (DUONEB) 0.5-2.5 (3) MG/3ML nebulizer solution 3 mL (3 mLs Nebulization Given 10/25/17 1012)    No orders of the defined types were placed in this encounter.   No results found for this or any previous visit (from the past 24 hour(s)). No results found.  ED Clinical Impression  COPD exacerbation (HCC)   ED Assessment/Plan  Baseline O2 sat somewhere between 93 and 95%.  Last visit was 95% Previous records reviewed as noted in HPI.  Patient with a COPD exacerbation.  Today's O2 sat is 92%.  We will give DuoNeb and recheck this.  It sounds like the cough is getting worse, not the shortness of breath or dyspnea on exertion.  She has not really changed the color of her sputum, or the amount of the sputum that she produces.  We will send her home with Tussionex, and she is to buy a portable pulse ox meter and measure her O2 sats 3 or 4 times a day.  If it gets to be 90%, she needs to go to  the ER.  She also will go to the ER if she is requiring her Xopenex and DuoNeb's more frequently because of shortness of breath, or any other concerns.  She will also need to follow-up with her pulmonologist in 1-2 days.  On reevaluation, patient states that she feels better.  Physical exam still with prolonged expiratory phase and diffuse wheezing throughout.  No rales, rhonchi.  Repeat O2 saturation 99%.  Plan as above.  [facility] narcotic database reviewed.  Last opiate prescription was for Tussionex on 08/06/2017.  Discussed MDM, plan and followup with patient. Discussed sn/sx that should prompt return to the ED. patient agrees with plan.   Meds ordered this encounter  Medications  . ipratropium-albuterol (DUONEB) 0.5-2.5 (3) MG/3ML nebulizer solution 3  mL  . chlorpheniramine-HYDROcodone (TUSSIONEX PENNKINETIC ER) 10-8 MG/5ML SUER    Sig: Take 5 mLs by mouth every 12 (twelve) hours as needed for cough.    Dispense:  120 mL    Refill:  0    *This clinic note was created using Scientist, clinical (histocompatibility and immunogenetics)Dragon dictation software. Therefore, there may be occasional mistakes despite careful proofreading.   ?   Domenick GongMortenson, Kymberly Blomberg, MD 10/26/17 [phone]

## 2017-10-25 NOTE — ED Triage Notes (Signed)
Patient complains cough, congestion, wheezing that started on Sunday. Patient states that she was seen here on Sunday and treated with prednisone, Azithromycin and tessalon. Patient reports that symptoms have not improved at all. Patient states that she feels very sluggish and tired and feels shortness of breath and tight.

## 2017-10-25 NOTE — Discharge Instructions (Signed)
Continue your medicines as written.  By a pulse oximeter and measure your blood oxygen levels 3-4 times a day.  Write it down.  If it gets to 90%, then go to the emergency department.  Call Dr. Meredeth IdeFleming and let them know that you are having a COPD exacerbation.  They may want you to come in and be evaluated.  Go immediately to the ER for the signs and symptoms we discussed.

## 2018-03-09 ENCOUNTER — Other Ambulatory Visit: Payer: Self-pay

## 2018-03-09 ENCOUNTER — Emergency Department
Admission: EM | Admit: 2018-03-09 | Discharge: 2018-03-09 | Disposition: A | Discharge status: Home / Self Care | Payer: Medicare Other | Attending: Emergency Medicine | Admitting: Emergency Medicine

## 2018-03-09 ENCOUNTER — Emergency Department: Payer: Medicare Other

## 2018-03-09 DIAGNOSIS — J189 Pneumonia, unspecified organism: Secondary | ICD-10-CM | POA: Insufficient documentation

## 2018-03-09 DIAGNOSIS — J441 Chronic obstructive pulmonary disease with (acute) exacerbation: Secondary | ICD-10-CM

## 2018-03-09 DIAGNOSIS — R0602 Shortness of breath: Secondary | ICD-10-CM | POA: Diagnosis not present

## 2018-03-09 DIAGNOSIS — Z87891 Personal history of nicotine dependence: Secondary | ICD-10-CM | POA: Insufficient documentation

## 2018-03-09 DIAGNOSIS — Z7984 Long term (current) use of oral hypoglycemic drugs: Secondary | ICD-10-CM | POA: Diagnosis not present

## 2018-03-09 DIAGNOSIS — Z79899 Other long term (current) drug therapy: Secondary | ICD-10-CM | POA: Insufficient documentation

## 2018-03-09 DIAGNOSIS — R05 Cough: Secondary | ICD-10-CM | POA: Diagnosis present

## 2018-03-09 DIAGNOSIS — E119 Type 2 diabetes mellitus without complications: Secondary | ICD-10-CM | POA: Diagnosis not present

## 2018-03-09 DIAGNOSIS — I10 Essential (primary) hypertension: Secondary | ICD-10-CM | POA: Diagnosis not present

## 2018-03-09 LAB — CBC
HCT: 43.3 % (ref 35.0–47.0)
Hemoglobin: 14.6 g/dL (ref 12.0–16.0)
MCH: 30.6 pg (ref 26.0–34.0)
MCHC: 33.7 g/dL (ref 32.0–36.0)
MCV: 90.9 fL (ref 80.0–100.0)
PLATELETS: 206 10*3/uL (ref 150–440)
RBC: 4.76 MIL/uL (ref 3.80–5.20)
RDW: 14.5 % (ref 11.5–14.5)
WBC: 6.7 10*3/uL (ref 3.6–11.0)

## 2018-03-09 LAB — COMPREHENSIVE METABOLIC PANEL
ALT: 22 U/L (ref 0–44)
AST: 25 U/L (ref 15–41)
Albumin: 4 g/dL (ref 3.5–5.0)
Alkaline Phosphatase: 89 U/L (ref 38–126)
Anion gap: 8 (ref 5–15)
BUN: 18 mg/dL (ref 8–23)
CALCIUM: 9 mg/dL (ref 8.9–10.3)
CO2: 24 mmol/L (ref 22–32)
CREATININE: 0.71 mg/dL (ref 0.44–1.00)
Chloride: 110 mmol/L (ref 98–111)
Glucose, Bld: 114 mg/dL — ABNORMAL HIGH (ref 70–99)
Potassium: 3.6 mmol/L (ref 3.5–5.1)
Sodium: 142 mmol/L (ref 135–145)
TOTAL PROTEIN: 7.2 g/dL (ref 6.5–8.1)
Total Bilirubin: 0.4 mg/dL (ref 0.3–1.2)

## 2018-03-09 LAB — TROPONIN I

## 2018-03-09 MED ORDER — AZITHROMYCIN 500 MG PO TABS
500.0000 mg | ORAL_TABLET | Freq: Once | ORAL | Status: AC
  Administered 2018-03-09: 500 mg via ORAL
  Filled 2018-03-09: qty 1

## 2018-03-09 MED ORDER — AZITHROMYCIN 250 MG PO TABS: 250.0000 mg | ORAL_TABLET | Freq: Every day | ORAL | 0 refills | Status: DC

## 2018-03-09 MED ORDER — PREDNISONE 20 MG PO TABS: 40.0000 mg | ORAL_TABLET | Freq: Every day | ORAL | 0 refills | Status: DC

## 2018-03-09 MED ORDER — GUAIFENESIN-CODEINE 100-10 MG/5ML PO SOLN: 5.0000 mL | Freq: Four times a day (QID) | ORAL | 0 refills | Status: DC | PRN

## 2018-03-09 MED ORDER — METHYLPREDNISOLONE SODIUM SUCC 125 MG IJ SOLR
125.0000 mg | Freq: Once | INTRAMUSCULAR | Status: AC
  Administered 2018-03-09: 125 mg via INTRAVENOUS
  Filled 2018-03-09: qty 2

## 2018-03-09 MED ORDER — IPRATROPIUM-ALBUTEROL 0.5-2.5 (3) MG/3ML IN SOLN
3.0000 mL | Freq: Once | RESPIRATORY_TRACT | Status: AC
  Administered 2018-03-09: 3 mL via RESPIRATORY_TRACT
  Filled 2018-03-09: qty 3

## 2018-03-09 NOTE — ED Notes (Signed)
Patient transported to X-ray 

## 2018-03-09 NOTE — ED Notes (Signed)
No peripheral IV placed this visit.   Discharge instructions reviewed with patient. Questions fielded by this RN. Patient verbalizes understanding of instructions. Patient discharged home in stable condition per Dr Lenard LancePaduchowski. No acute distress noted at time of discharge.

## 2018-03-09 NOTE — ED Notes (Signed)
ED Provider at bedside.  Pt reports cough for the last 4 days , and SOB last night without relief from albuterol nebulizer last night at 2200  Takes, dulera, xopenex, and symbicort for hx of COPD  Pt reports regular smoker but this week started cessation meds

## 2018-03-09 NOTE — ED Triage Notes (Signed)
Patient reports cough and more short of breath than usual (hx of COPD).  Reports started Thursday.  Patient is able to speak in complete sentences without difficulty.

## 2018-03-09 NOTE — ED Provider Notes (Signed)
[facility] Emergency Department Provider Note  Time seen: 3:41 AM  I have reviewed the triage vital signs and the nursing notes.   HISTORY  Chief Complaint Cough and Shortness of Breath    HPI Katherine Carter is a 67 y.o. female with a past medical history of hypertension, diabetes, COPD presents to the emergency department for shortness of breath.  According to the patient for the past 3 days she has had cough.  Denies any significant sputum production.  Denies fever.  She states tonight the cough was worse and around 10:30 PM she began feeling more short of breath.  Tried her nebulizer treatment but it did not help so she came to the emergency department for evaluation.  Denies any chest pain or abdominal pain.  Largely negative review of systems.   Past Medical History:  Diagnosis Date  . COPD (chronic obstructive pulmonary disease) (HCC)   . Diabetes mellitus without complication (HCC)   . Hypertension     Patient Active Problem List   Diagnosis Date Noted  . Acute respiratory failure (HCC) 12/10/2015    Past Surgical History:  Procedure Laterality Date  . ABDOMINAL HYSTERECTOMY    . COLONOSCOPY WITH PROPOFOL N/A 01/23/2017   Procedure: COLONOSCOPY WITH PROPOFOL;  Surgeon: Christena DeemSkulskie, Martin U, MD;  [facility] END[facility];  Service: Endoscopy;  Laterality: N/A;  . FOOT SURGERY      Prior to Admission medications   Medication Sig Start Date End Date Taking? Authorizing Provider  acetaminophen (TYLENOL) 325 MG tablet Take 2 tablets (650 mg total) by mouth every 6 (six) hours as needed for mild pain (or Fever >/= 101). 12/13/15   Ramonita LabGouru, Aruna, MD  aspirin EC 81 MG tablet Take 81 mg by mouth daily.    [provider]  azithromycin (ZITHROMAX) 500 MG tablet Take 1 tablet (500 mg total) by mouth daily. 10/21/17   Defelice, Para MarchJeanette, NP  benzonatate (TESSALON) 100 MG capsule Take 1 capsule (100 mg total) by mouth 3 (three) times daily as  needed. 10/21/17   Defelice, Para MarchJeanette, NP  chlorpheniramine-HYDROcodone (TUSSIONEX PENNKINETIC ER) 10-8 MG/5ML SUER Take 5 mLs by mouth every 12 (twelve) hours as needed for cough. 10/25/17   Domenick GongMortenson, Ashley, MD  diclofenac (VOLTAREN) 75 MG EC tablet Take 75 mg by mouth 2 (two) times daily as needed.    [provider]  fexofenadine-pseudoephedrine (ALLEGRA-D ALLERGY & CONGESTION) 180-240 MG 24 hr tablet Take 1 tablet by mouth daily. 12/07/15   Hassan RowanWade, Eugene, MD  ipratropium-albuterol (DUONEB) 0.5-2.5 (3) MG/3ML SOLN Take 3 mLs by nebulization every 4 (four) hours as needed. 02/14/17   Irean HongSung, Jade J, MD  levalbuterol [facility](XOPENEX HFA) 45 MCG/ACT inhaler Inhale 2 puffs into the lungs every 4 (four) hours as needed for wheezing.    [provider]  metFORMIN (GLUCOPHAGE-XR) 500 MG 24 hr tablet Take 500 mg by mouth daily with breakfast.    [provider]  mometasone-formoterol (DULERA) 100-5 MCG/ACT AERO Inhale 1 puff into the lungs 2 (two) times daily.    [provider]  predniSONE (DELTASONE) 20 MG tablet 2 tabs po qd for 3 days, then 1 tab po qd x 3 days, then half a tab po qd for 2 days 10/21/17   Defelice, Para MarchJeanette, NP  tiotropium (SPIRIVA HANDIHALER) 18 MCG inhalation capsule Place 1 capsule (18 mcg total) into inhaler and inhale daily. 12/13/15   Ramonita LabGouru, Aruna, MD    Allergies  Allergen Reactions  . Penicillins Hives  Family History  Problem Relation Age of Onset  . CAD Mother   . CAD Father   . Breast cancer Neg Hx     Social History Social History   Tobacco Use  . Smoking status: Former Smoker    Packs/day: 0.50    Years: 25.00    Pack years: 12.50    Types: Cigarettes    Last attempt to quit: 07/06/2014    Years since quitting: 3.6  . Smokeless tobacco: Never Used  Substance Use Topics  . Alcohol use: No    Alcohol/week: 0.0 oz  . Drug use: No    Review of Systems Constitutional: Negative for fever. Eyes: Negative for visual  complaints ENT: Negative for recent illness/congestion Cardiovascular: Negative for chest pain. Respiratory: Positive for shortness of breath.  Positive for cough x3 days. Gastrointestinal: Negative for abdominal pain Musculoskeletal: Negative for leg pain or swelling Neurological: Negative for headache All other ROS negative  ____________________________________________   PHYSICAL EXAM:  VITAL SIGNS: ED Triage Vitals  Enc Vitals Group     BP 03/09/18 0313 (!) 143/79     Pulse Rate 03/09/18 0313 96     Resp 03/09/18 0313 20     Temp 03/09/18 0313 97.8 F (36.6 C)     Temp Source 03/09/18 0313 Oral     SpO2 03/09/18 0313 95 %     Weight 03/09/18 0303 160 lb (72.6 kg)     Height 03/09/18 0303 5\' 3"  (1.6 m)     Head Circumference --      Peak Flow --      Pain Score 03/09/18 0303 0     Pain Loc --      Pain Edu? --      Excl. in GC? --    Constitutional: Alert and oriented. Well appearing and in no distress. Eyes: Normal exam ENT   Head: Normocephalic and atraumatic.   Nose: No congestion/rhinnorhea.   Mouth/Throat: Mucous membranes are moist. Cardiovascular: Normal rate, regular rhythm.  Respiratory: Normal respiratory effort without tachypnea nor retractions.  Mild expiratory wheeze bilaterally.  No obvious rales or rhonchi. Gastrointestinal: Soft and nontender. No distention. Musculoskeletal: Nontender with normal range of motion in all extremities. No lower extremity tenderness or edema. Neurologic:  Normal speech and language. No gross focal neurologic deficits Skin:  Skin is warm, dry and intact.  Psychiatric: Mood and affect are normal.   ____________________________________________    EKG  EKG reviewed and interpreted by myself shows normal sinus rhythm 82 bpm with a narrow QRS, normal axis, normal intervals, no ST changes.  ____________________________________________    RADIOLOGY  X-ray shows patchy bilateral lung base  infiltrate.  ____________________________________________   INITIAL IMPRESSION / ASSESSMENT AND PLAN / ED COURSE  Pertinent labs & imaging results that were available during my care of the patient were reviewed by me and considered in my medical decision making (see chart for details).  Patient presents to the emergency department for shortness of breath and cough over the past 3 days.  Differential would include COPD exacerbation, pneumonia, pneumothorax, URI, ACS.  Clinical exam is most consistent with COPD exacerbation with bilateral expiratory wheeze.  We will treat with duo nebs, prednisone.  We will check labs and a chest x-ray and continue to closely monitor.  Chest x-ray shows bilateral basilar infiltrates.  Reassuringly patient's labs are largely within normal limits including a normal white blood cell count.  96% on room air.  We will treat with Solu-Medrol in the  emergency department.  Dose of Zithromax.  We will discharge with the same for likely COPD exacerbation due to underlying pneumonia.  Patient agreeable to this plan of care and will follow up with her doctor on Monday. ____________________________________________   FINAL CLINICAL IMPRESSION(S) / ED DIAGNOSES  Dyspnea COPD exacerbation CAP   Minna Antis, MD 03/09/18 [phone]

## 2018-04-19 ENCOUNTER — Other Ambulatory Visit: Payer: Self-pay | Admitting: Family Medicine

## 2018-04-19 DIAGNOSIS — Z780 Asymptomatic menopausal state: Secondary | ICD-10-CM

## 2018-05-07 ENCOUNTER — Ambulatory Visit
Admission: RE | Admit: 2018-05-07 | Discharge: 2018-05-07 | Disposition: A | Discharge status: Home / Self Care | Payer: Medicare Other | Source: Ambulatory Visit | Attending: Family Medicine | Admitting: Family Medicine

## 2018-05-07 DIAGNOSIS — Z780 Asymptomatic menopausal state: Secondary | ICD-10-CM | POA: Diagnosis present

## 2018-06-11 ENCOUNTER — Other Ambulatory Visit: Payer: Self-pay | Admitting: Specialist

## 2018-06-11 DIAGNOSIS — R0602 Shortness of breath: Secondary | ICD-10-CM

## 2018-06-11 DIAGNOSIS — J439 Emphysema, unspecified: Secondary | ICD-10-CM

## 2018-06-11 DIAGNOSIS — R053 Chronic cough: Secondary | ICD-10-CM

## 2018-06-11 DIAGNOSIS — Z9981 Dependence on supplemental oxygen: Secondary | ICD-10-CM

## 2018-06-11 DIAGNOSIS — R05 Cough: Secondary | ICD-10-CM

## 2018-06-11 DIAGNOSIS — F17200 Nicotine dependence, unspecified, uncomplicated: Secondary | ICD-10-CM

## 2018-07-08 ENCOUNTER — Emergency Department: Payer: Medicare Other

## 2018-07-08 ENCOUNTER — Inpatient Hospital Stay
Admission: EM | Admit: 2018-07-08 | Discharge: 2018-07-11 | DRG: 871 | Disposition: A | Discharge status: Home Health | Payer: Medicare Other | Attending: Internal Medicine | Admitting: Internal Medicine

## 2018-07-08 ENCOUNTER — Other Ambulatory Visit: Payer: Self-pay

## 2018-07-08 ENCOUNTER — Encounter: Payer: Self-pay | Admitting: Emergency Medicine

## 2018-07-08 DIAGNOSIS — Z90710 Acquired absence of both cervix and uterus: Secondary | ICD-10-CM | POA: Diagnosis not present

## 2018-07-08 DIAGNOSIS — Z7982 Long term (current) use of aspirin: Secondary | ICD-10-CM

## 2018-07-08 DIAGNOSIS — J9602 Acute respiratory failure with hypercapnia: Secondary | ICD-10-CM | POA: Diagnosis present

## 2018-07-08 DIAGNOSIS — Z880 Allergy status to penicillin: Secondary | ICD-10-CM | POA: Diagnosis not present

## 2018-07-08 DIAGNOSIS — Z8249 Family history of ischemic heart disease and other diseases of the circulatory system: Secondary | ICD-10-CM

## 2018-07-08 DIAGNOSIS — J440 Chronic obstructive pulmonary disease with acute lower respiratory infection: Secondary | ICD-10-CM | POA: Diagnosis present

## 2018-07-08 DIAGNOSIS — E119 Type 2 diabetes mellitus without complications: Secondary | ICD-10-CM | POA: Diagnosis present

## 2018-07-08 DIAGNOSIS — R0602 Shortness of breath: Secondary | ICD-10-CM | POA: Diagnosis present

## 2018-07-08 DIAGNOSIS — J441 Chronic obstructive pulmonary disease with (acute) exacerbation: Secondary | ICD-10-CM | POA: Diagnosis present

## 2018-07-08 DIAGNOSIS — E872 Acidosis: Secondary | ICD-10-CM | POA: Diagnosis present

## 2018-07-08 DIAGNOSIS — A419 Sepsis, unspecified organism: Principal | ICD-10-CM | POA: Diagnosis present

## 2018-07-08 DIAGNOSIS — Z87891 Personal history of nicotine dependence: Secondary | ICD-10-CM | POA: Diagnosis not present

## 2018-07-08 DIAGNOSIS — J189 Pneumonia, unspecified organism: Secondary | ICD-10-CM | POA: Diagnosis present

## 2018-07-08 DIAGNOSIS — J9601 Acute respiratory failure with hypoxia: Secondary | ICD-10-CM | POA: Diagnosis present

## 2018-07-08 DIAGNOSIS — I10 Essential (primary) hypertension: Secondary | ICD-10-CM | POA: Diagnosis present

## 2018-07-08 DIAGNOSIS — Z7984 Long term (current) use of oral hypoglycemic drugs: Secondary | ICD-10-CM | POA: Diagnosis not present

## 2018-07-08 LAB — COMPREHENSIVE METABOLIC PANEL
ALBUMIN: 4.7 g/dL (ref 3.5–5.0)
ALT: 24 U/L (ref 0–44)
AST: 28 U/L (ref 15–41)
Alkaline Phosphatase: 97 U/L (ref 38–126)
Anion gap: 8 (ref 5–15)
BILIRUBIN TOTAL: 0.5 mg/dL (ref 0.3–1.2)
BUN: 15 mg/dL (ref 8–23)
CHLORIDE: 106 mmol/L (ref 98–111)
CO2: 28 mmol/L (ref 22–32)
Calcium: 9.5 mg/dL (ref 8.9–10.3)
Creatinine, Ser: 0.69 mg/dL (ref 0.44–1.00)
GFR calc Af Amer: >60 mL/min (ref >60)
GFR calc non Af Amer: >60 mL/min (ref >60)
GLUCOSE: 157 mg/dL — AB (ref 70–99)
Potassium: 4.7 mmol/L (ref 3.5–5.1)
SODIUM: 142 mmol/L (ref 135–145)
TOTAL PROTEIN: 8.3 g/dL — AB (ref 6.5–8.1)

## 2018-07-08 LAB — CBC
HEMATOCRIT: 50.9 % — AB (ref 36.0–46.0)
HEMOGLOBIN: 16.1 g/dL — AB (ref 12.0–15.0)
MCH: 30 pg (ref 26.0–34.0)
MCHC: 31.6 g/dL (ref 30.0–36.0)
MCV: 95 fL (ref 80.0–100.0)
NRBC: 0 % (ref 0.0–0.2)
Platelets: 263 10*3/uL (ref 150–400)
RBC: 5.36 MIL/uL — ABNORMAL HIGH (ref 3.87–5.11)
RDW: 14.5 % (ref 11.5–15.5)
WBC: 15.8 10*3/uL — AB (ref 4.0–10.5)

## 2018-07-08 LAB — BRAIN NATRIURETIC PEPTIDE: B Natriuretic Peptide: 37 pg/mL (ref 0.0–100.0)

## 2018-07-08 LAB — BLOOD GAS, VENOUS
ACID-BASE DEFICIT: 1.7 mmol/L (ref 0.0–2.0)
BICARBONATE: 29.5 mmol/L — AB (ref 20.0–28.0)
O2 Saturation: 98.6 %
PCO2 VEN: 79 mmHg — AB (ref 44.0–60.0)
PH VEN: 7.18 — AB (ref 7.250–7.430)
Patient temperature: 37
pO2, Ven: 141 mmHg — ABNORMAL HIGH (ref 32.0–45.0)

## 2018-07-08 LAB — TROPONIN I: Troponin I: <0.03 ng/mL (ref <0.03)

## 2018-07-08 LAB — LACTIC ACID, PLASMA: Lactic Acid, Venous: 1.5 mmol/L (ref 0.5–1.9)

## 2018-07-08 MED ORDER — MAGNESIUM SULFATE 2 GM/50ML IV SOLN
2.0000 g | Freq: Once | INTRAVENOUS | Status: AC
  Administered 2018-07-08: 2 g via INTRAVENOUS

## 2018-07-08 MED ORDER — VANCOMYCIN HCL IN DEXTROSE 750-5 MG/150ML-% IV SOLN
750.0000 mg | Freq: Once | INTRAVENOUS | Status: AC
  Administered 2018-07-08: 750 mg via INTRAVENOUS
  Filled 2018-07-08: qty 150

## 2018-07-08 MED ORDER — SODIUM CHLORIDE 0.9 % IV SOLN
1.0000 g | Freq: Once | INTRAVENOUS | Status: AC
  Administered 2018-07-08: 1 g via INTRAVENOUS
  Filled 2018-07-08: qty 10

## 2018-07-08 MED ORDER — SODIUM CHLORIDE 0.9 % IV SOLN
500.0000 mg | Freq: Once | INTRAVENOUS | Status: AC
  Administered 2018-07-08: 500 mg via INTRAVENOUS
  Filled 2018-07-08: qty 500

## 2018-07-08 MED ORDER — LEVALBUTEROL HCL 1.25 MG/0.5ML IN NEBU
1.2500 mg | INHALATION_SOLUTION | Freq: Four times a day (QID) | RESPIRATORY_TRACT | Status: DC | PRN
  Filled 2018-07-08: qty 0.5

## 2018-07-08 MED ORDER — METHYLPREDNISOLONE SODIUM SUCC 125 MG IJ SOLR
60.0000 mg | Freq: Four times a day (QID) | INTRAMUSCULAR | Status: DC
  Administered 2018-07-09 – 2018-07-11 (×10): 60 mg via INTRAVENOUS
  Filled 2018-07-08 (×10): qty 2

## 2018-07-08 MED ORDER — VANCOMYCIN HCL IN DEXTROSE 750-5 MG/150ML-% IV SOLN
750.0000 mg | Freq: Two times a day (BID) | INTRAVENOUS | Status: DC
  Administered 2018-07-09 – 2018-07-10 (×3): 750 mg via INTRAVENOUS
  Filled 2018-07-08 (×6): qty 150

## 2018-07-08 NOTE — ED Provider Notes (Signed)
[facility] Emergency Department Provider Note   ____________________________________________    I have reviewed the triage vital signs and the nursing notes.   HISTORY  Chief Complaint Shortness of Breath  History limited by severe shortness of breath   HPI Katherine Carter is a 67 y.o. female with a history of COPD and diabetes who presents today with shortness of breath.  Patient is unable to give significant history due to severe shortness of breath.  EMS reports patient was given Solu-Medrol and multiple duo nebs   Past Medical History:  Diagnosis Date  . COPD (chronic obstructive pulmonary disease) (HCC)   . Diabetes mellitus without complication (HCC)   . Hypertension     Patient Active Problem List   Diagnosis Date Noted  . Acute respiratory failure (HCC) 12/10/2015    Past Surgical History:  Procedure Laterality Date  . ABDOMINAL HYSTERECTOMY    . COLONOSCOPY WITH PROPOFOL N/A 01/23/2017   Procedure: COLONOSCOPY WITH PROPOFOL;  Surgeon: Christena Deem, MD;  Location: [facility] ENDOSCOPY;  Service: Endoscopy;  Laterality: N/A;  . FOOT SURGERY      Prior to Admission medications   Medication Sig Start Date End Date Taking? Authorizing Provider  acetaminophen (TYLENOL) 325 MG tablet Take 2 tablets (650 mg total) by mouth every 6 (six) hours as needed for mild pain (or Fever >/= 101). 12/13/15   Ramonita Lab, MD  aspirin EC 81 MG tablet Take 81 mg by mouth daily.    [provider]  azithromycin (ZITHROMAX) 250 MG tablet Take 1 tablet (250 mg total) by mouth daily. Patient not taking: Reported on 07/08/2018 03/09/18   Minna Antis, MD  diclofenac (VOLTAREN) 75 MG EC tablet Take 75 mg by mouth 2 (two) times daily as needed.    [provider]  guaiFENesin-codeine 100-10 MG/5ML syrup Take 5 mLs by mouth every 6 (six) hours as needed for cough. Patient not taking: Reported on 07/08/2018 03/09/18   Minna Antis, MD  ipratropium-albuterol (DUONEB) 0.5-2.5 (3) MG/3ML SOLN Take 3 mLs by nebulization every 4 (four) hours as needed. 02/14/17   Irean Hong, MD  levalbuterol [facility] HFA) 45 MCG/ACT inhaler Inhale 2 puffs into the lungs every 4 (four) hours as needed for wheezing.    [provider]  metFORMIN (GLUCOPHAGE-XR) 500 MG 24 hr tablet Take 500 mg by mouth daily with breakfast.    [provider]  mometasone-formoterol (DULERA) 100-5 MCG/ACT AERO Inhale 1 puff into the lungs 2 (two) times daily.    [provider]  predniSONE (DELTASONE) 20 MG tablet Take 2 tablets (40 mg total) by mouth daily. Patient not taking: Reported on 07/08/2018 03/09/18   Minna Antis, MD  tiotropium (SPIRIVA HANDIHALER) 18 MCG inhalation capsule Place 1 capsule (18 mcg total) into inhaler and inhale daily. 12/13/15   Ramonita Lab, MD     Allergies Penicillins  Family History  Problem Relation Age of Onset  . CAD Mother   . CAD Father   . Breast cancer Neg Hx     Social History Social History   Tobacco Use  . Smoking status: Former Smoker    Packs/day: 0.50    Years: 25.00    Pack years: 12.50    Types: Cigarettes    Last attempt to quit: 07/06/2014    Years since quitting: 4.0  . Smokeless tobacco: Never Used  Substance Use Topics  . Alcohol use: No    Alcohol/week: 0.0 standard drinks  .  Drug use: No    Level 5 caveat: Unable to obtain review of Systems due to severe shortness of breath     ____________________________________________   PHYSICAL EXAM:  VITAL SIGNS: ED Triage Vitals  Enc Vitals Group     BP 07/08/18 1957 (!) 213/11     Pulse Rate 07/08/18 1952 (!) 134     Resp --      Temp 07/08/18 1957 98.9 F (37.2 C)     Temp Source 07/08/18 1957 Oral     SpO2 07/08/18 1952 99 %     Weight 07/08/18 1952 68 kg (150 lb)     Height 07/08/18 1952 1.524 m (5')     Head Circumference --      Peak Flow --      Pain Score 07/08/18 1952 0     Pain Loc --       Pain Edu? --      Excl. in GC? --     Constitutional: Alert and oriented.  Sitting straight up in tripod position  Nose: No congestion/rhinnorhea. Mouth/Throat: Mucous membranes are moist.   Neck:  Painless ROM, no stridor Cardiovascular: Tachycardia, regular rhythm. Grossly normal heart sounds.  Good peripheral circulation. Respiratory: Increased respiratory effort with retractions and tachypnea.  Diffuse wheezing Gastrointestinal: Soft and nontender. No distention.    Musculoskeletal: No lower extremity tenderness nor edema.  Warm and well perfused Neurologic:   No gross focal neurologic deficits are appreciated.  Skin:  Skin is warm, dry and intact. No rash noted.   ____________________________________________   LABS (all labs ordered are listed, but only abnormal results are displayed)  Labs Reviewed  CBC - Abnormal; Notable for the following components:      Result Value   WBC 15.8 (*)    RBC 5.36 (*)    Hemoglobin 16.1 (*)    HCT 50.9 (*)    All other components within normal limits  COMPREHENSIVE METABOLIC PANEL - Abnormal; Notable for the following components:   Glucose, Bld 157 (*)    Total Protein 8.3 (*)    All other components within normal limits  BLOOD GAS, VENOUS - Abnormal; Notable for the following components:   pH, Ven 7.18 (*)    pCO2, Ven 79 (*)    pO2, Ven 141.0 (*)    Bicarbonate 29.5 (*)    All other components within normal limits  CULTURE, BLOOD (ROUTINE X 2)  CULTURE, BLOOD (ROUTINE X 2)  TROPONIN I  BRAIN NATRIURETIC PEPTIDE  LACTIC ACID, PLASMA  LACTIC ACID, PLASMA   ____________________________________________  EKG  ED ECG REPORT I, Jene Every, the attending physician, personally viewed and interpreted this ECG.  Date: 07/08/2018  Rhythm: normal sinus rhythm QRS Axis: normal Intervals: normal ST/T Wave abnormalities: normal Narrative Interpretation: no evidence of acute  ischemia  ____________________________________________  RADIOLOGY  Chest x-ray shows bibasilar opacities ____________________________________________   PROCEDURES  Procedure(s) performed: No  Procedures   Critical Care performed: Yes  CRITICAL CARE Performed by: Jene Every   Total critical care time:35 minutes  Critical care time was exclusive of separately billable procedures and treating other patients.  Critical care was necessary to treat or prevent imminent or life-threatening deterioration.  Critical care was time spent personally by me on the following activities: development of treatment plan with patient and/or surrogate as well as nursing, discussions with consultants, evaluation of patient's response to treatment, examination of patient, obtaining history from patient or surrogate, ordering and performing treatments and interventions,  ordering and review of laboratory studies, ordering and review of radiographic studies, pulse oximetry and re-evaluation of patient's condition.  ____________________________________________   INITIAL IMPRESSION / ASSESSMENT AND PLAN / ED COURSE  Pertinent labs & imaging results that were available during my care of the patient were reviewed by me and considered in my medical decision making (see chart for details).  Patient presents in respiratory distress.  Has already received Solu-Medrol and duo nebs, IV magnesium given in the emergency department.  Stat BiPAP ordered, patient critically ill and preparation for intubation made but likely RT arrived with BiPAP.  Patient improved significantly with BiPAP.  Labs are significant for a pH of 7.18 and a PCO2 of 79 and a white blood cell count of 16,000.  Chest x-ray is suspicious for pneumonia.  Lactic is normal.  Broad-spectrum antibiotic ordered.  Admitted to the hospitalist service    ____________________________________________   FINAL CLINICAL IMPRESSION(S) / ED  DIAGNOSES  Final diagnoses:  Acute respiratory failure with hypoxia and hypercapnia (HCC)  COPD exacerbation (HCC)  Community acquired pneumonia, unspecified laterality        Note:  This document was prepared using Conservation officer, historic buildings and may include unintentional dictation errors.      Jene Every, MD 07/08/18 2100

## 2018-07-08 NOTE — ED Notes (Signed)
Pt placed on bipap by RT

## 2018-07-08 NOTE — Progress Notes (Signed)
Pharmacy Antibiotic Note  Katherine Carter is a 67 y.o. female admitted on 07/08/2018 with pneumonia.  Pharmacy has been consulted for Vancomycin dosing.  Plan: Vancomycin 750 IV every 12 hours.  Goal trough 15-20 mcg/mL.  Height: 5' (152.4 cm) Weight: 150 lb (68 kg) IBW/kg (Calculated) : 45.5  Temp (24hrs), Avg:98.9 F (37.2 C), Min:98.9 F (37.2 C), Max:98.9 F (37.2 C)  Recent Labs  Lab 07/08/18 1952 07/08/18 1953  WBC 15.8*  --   CREATININE 0.69  --   LATICACIDVEN  --  1.5    Estimated Creatinine Clearance: 58.7 mL/min (by C-G formula based on SCr of 0.69 mg/dL).    Allergies  Allergen Reactions  . Penicillins Hives    Antimicrobials this admission: Ceftriaxone 11/4 >> 11/4 Azithromycin 11/4 >> 11/4 Vancomycin 11/4 >>   Dose adjustments this admission: N/A  Microbiology results: 11/4 BCx: pending  Thank you for allowing pharmacy to be a part of this patient's care.  Stormy Card, [facility] 07/08/2018 9:05 PM

## 2018-07-08 NOTE — ED Triage Notes (Signed)
Pt arrived via  EMS from home with c/o SHOB. EMS states that the pt has been SOB as well as having a productive cough since yesterday. EMS states that the pt was 89% on RA and was given 3 duonebs en route. EMS states the pt had some expiratory wheezing as well as not able to move any air.

## 2018-07-08 NOTE — H&P (Signed)
[facility] - [place] at [facility]   PATIENT NAME: Katherine Carter    MR#:  [id number]  DATE OF BIRTH:  Nov 14, 1950  DATE OF ADMISSION:  07/08/2018  PRIMARY CARE PHYSICIAN: Emogene Morgan, MD   REQUESTING/REFERRING PHYSICIAN: Cyril Loosen, MD  CHIEF COMPLAINT:   Chief Complaint  Patient presents with  . Shortness of Breath    HISTORY OF PRESENT ILLNESS:  Katherine Carter  is a 67 y.o. female who presents with chief complaint as above.  Patient presents with 2 to 3 days progressive shortness of breath and increasing cough.  She saw someone in urgent care yesterday and was given prednisone and an antibiotic, but had no improvement in her symptoms.  Got acutely worse tonight and she called EMS.  On arrival in the ED she was hypoxic requiring noninvasive ventilation, blood gas showed hypercapnia and some corresponding acidemia.  Chest x-ray showed pneumonia, and she meets sepsis criteria.  She was treated for COPD and pneumonia, is currently improving on BiPAP, and hospitalist were called for admission  PAST MEDICAL HISTORY:   Past Medical History:  Diagnosis Date  . COPD (chronic obstructive pulmonary disease) (HCC)   . Diabetes mellitus without complication (HCC)   . Hypertension      PAST SURGICAL HISTORY:   Past Surgical History:  Procedure Laterality Date  . ABDOMINAL HYSTERECTOMY    . COLONOSCOPY WITH PROPOFOL N/A 01/23/2017   Procedure: COLONOSCOPY WITH PROPOFOL;  Surgeon: Christena Deem, MD;  Location: [facility] ENDOSCOPY;  Service: Endoscopy;  Laterality: N/A;  . FOOT SURGERY       SOCIAL HISTORY:   Social History   Tobacco Use  . Smoking status: Former Smoker    Packs/day: 0.50    Years: 25.00    Pack years: 12.50    Types: Cigarettes    Last attempt to quit: 07/06/2014    Years since quitting: 4.0  . Smokeless tobacco: Never Used  Substance Use Topics  . Alcohol use: No    Alcohol/week: 0.0 standard drinks     FAMILY HISTORY:    Family History  Problem Relation Age of Onset  . CAD Mother   . CAD Father   . Breast cancer Neg Hx      DRUG ALLERGIES:   Allergies  Allergen Reactions  . Penicillins Hives    MEDICATIONS AT HOME:   Prior to Admission medications   Medication Sig Start Date End Date Taking? Authorizing Provider  acetaminophen (TYLENOL) 325 MG tablet Take 2 tablets (650 mg total) by mouth every 6 (six) hours as needed for mild pain (or Fever >/= 101). 12/13/15   Ramonita Lab, MD  aspirin EC 81 MG tablet Take 81 mg by mouth daily.    [provider]  azithromycin (ZITHROMAX) 250 MG tablet Take 1 tablet (250 mg total) by mouth daily. Patient not taking: Reported on 07/08/2018 03/09/18   Minna Antis, MD  diclofenac (VOLTAREN) 75 MG EC tablet Take 75 mg by mouth 2 (two) times daily as needed.    [provider]  doxycycline (VIBRAMYCIN) 100 MG capsule Take 100 mg by mouth 2 (two) times daily. 07/07/18   [provider]  fluticasone (FLONASE) 50 MCG/ACT nasal spray Place 1 spray into both nostrils 2 (two) times daily. 07/07/18   [provider]  guaiFENesin-codeine 100-10 MG/5ML syrup Take 5 mLs by mouth every 6 (six) hours as needed for cough. Patient not taking: Reported on 07/08/2018 03/09/18   Minna Antis, MD  ipratropium-albuterol (  DUONEB) 0.5-2.5 (3) MG/3ML SOLN Take 3 mLs by nebulization every 4 (four) hours as needed. 02/14/17   Irean Hong, MD  levalbuterol [facility] HFA) 45 MCG/ACT inhaler Inhale 2 puffs into the lungs every 4 (four) hours as needed for wheezing.    [provider]  metFORMIN (GLUCOPHAGE-XR) 500 MG 24 hr tablet Take 500 mg by mouth daily with breakfast.    [provider]  mometasone-formoterol (DULERA) 100-5 MCG/ACT AERO Inhale 1 puff into the lungs 2 (two) times daily.    [provider]  predniSONE (DELTASONE) 10 MG tablet Take 1-6 tablets by mouth daily. 6 PO Q D X 1 DAY, THEN 5 PO Q D X 1 DAY, THEN 4 PO Q  D X 1 DAY, THEN 3 PO Q D X 1 DAY, THEN 2 PO Q D X 1 DAY, THEN 1 PO Q D X 1 DAY 07/07/18   [provider]  predniSONE (DELTASONE) 20 MG tablet Take 2 tablets (40 mg total) by mouth daily. Patient not taking: Reported on 07/08/2018 03/09/18   Minna Antis, MD  tiotropium (SPIRIVA HANDIHALER) 18 MCG inhalation capsule Place 1 capsule (18 mcg total) into inhaler and inhale daily. 12/13/15   Ramonita Lab, MD    REVIEW OF SYSTEMS:  Review of Systems  Constitutional: Positive for malaise/fatigue. Negative for chills, fever and weight loss.  HENT: Negative for ear pain, hearing loss and tinnitus.   Eyes: Negative for blurred vision, double vision, pain and redness.  Respiratory: Positive for cough, sputum production, shortness of breath and wheezing. Negative for hemoptysis.   Cardiovascular: Negative for chest pain, palpitations, orthopnea and leg swelling.  Gastrointestinal: Negative for abdominal pain, constipation, diarrhea, nausea and vomiting.  Genitourinary: Negative for dysuria, frequency and hematuria.  Musculoskeletal: Negative for back pain, joint pain and neck pain.  Skin:       No acne, rash, or lesions  Neurological: Negative for dizziness, tremors, focal weakness and weakness.  Endo/Heme/Allergies: Negative for polydipsia. Does not bruise/bleed easily.  Psychiatric/Behavioral: Negative for depression. The patient is not nervous/anxious and does not have insomnia.      VITAL SIGNS:   Vitals:   07/08/18 2000 07/08/18 2030 07/08/18 2100 07/08/18 2130  BP: (!) 190/115 (!) 138/91 (!) 149/84 (!) 147/97  Pulse: (!) 136 (!) 116 (!) 110 (!) 107  Resp:  (!) 23 19 20   Temp:      TempSrc:      SpO2: 95% 99% 96% 94%  Weight:      Height:       Wt Readings from Last 3 Encounters:  07/08/18 68 kg  03/09/18 72.6 kg  10/25/17 63.5 kg    PHYSICAL EXAMINATION:  Physical Exam  Vitals reviewed. Constitutional: She is oriented to person, place, and time. She appears  well-developed and well-nourished. No distress.  HENT:  Head: Normocephalic and atraumatic.  Mouth/Throat: Oropharynx is clear and moist.  Eyes: Pupils are equal, round, and reactive to light. Conjunctivae and EOM are normal. No scleral icterus.  Neck: Normal range of motion. Neck supple. No JVD present. No thyromegaly present.  Cardiovascular: Regular rhythm and intact distal pulses. Exam reveals no gallop and no friction rub.  No murmur heard. Tachycardic  Respiratory: She is in respiratory distress (On BiPAP). She has wheezes (Diffuse, right greater than left). She has no rales.  Treated and left coarse breath sounds  GI: Soft. Bowel sounds are normal. She exhibits no distension. There is no tenderness.  Musculoskeletal: Normal range of  motion. She exhibits no edema.  No arthritis, no gout  Lymphadenopathy:    She has no cervical adenopathy.  Neurological: She is alert and oriented to person, place, and time. No cranial nerve deficit.  No dysarthria, no aphasia  Skin: Skin is warm and dry. No rash noted. No erythema.  Psychiatric: She has a normal mood and affect. Her behavior is normal. Judgment and thought content normal.    LABORATORY PANEL:   CBC Recent Labs  Lab 07/08/18 1952  WBC 15.8*  HGB 16.1*  HCT 50.9*  PLT 263   ------------------------------------------------------------------------------------------------------------------  Chemistries  Recent Labs  Lab 07/08/18 1952  NA 142  K 4.7  CL 106  CO2 28  GLUCOSE 157*  BUN 15  CREATININE 0.69  CALCIUM 9.5  AST 28  ALT 24  ALKPHOS 97  BILITOT 0.5   ------------------------------------------------------------------------------------------------------------------  Cardiac Enzymes Recent Labs  Lab 07/08/18 1952  TROPONINI <0.03   ------------------------------------------------------------------------------------------------------------------  RADIOLOGY:  Dg Chest Portable 1 View  Result Date:  07/08/2018 CLINICAL DATA:  Initial evaluation for acute shortness of breath. EXAM: PORTABLE CHEST 1 VIEW COMPARISON:  Prior radiograph from 03/09/2018. FINDINGS: Cardiomegaly grossly stable from previous. Mediastinal silhouette within normal limits. Aortic atherosclerosis. Lungs are hypoinflated. Probable underlying emphysematous changes. Patchy and hazy bibasilar opacities, right greater than left, suspicious for possible infiltrates. No definite pleural effusions, although the costophrenic angles are incompletely visualized. No overt pulmonary edema. No pneumothorax. Visualized osseous structures within normal limits. IMPRESSION: 1. Patchy bilateral airspace opacities, right greater than left, suspicious for possible infiltrates. 2. Suspected underlying emphysematous changes. 3. Aortic atherosclerosis. Electronically Signed   By: Rise Mu M.D.   On: 07/08/2018 20:14    EKG:   Orders placed or performed during the hospital encounter of 07/08/18  . ED EKG  . ED EKG  . EKG 12-Lead  . EKG 12-Lead    IMPRESSION AND PLAN:  Principal Problem:   Acute respiratory failure with hypoxia and hypercapnia (HCC) -oxygenation improved on BiPAP, she was hypercapnic on her initial gas with some acidemia, we will repeat this though given her improved respiratory status and mental status I suspect that this has already improved some.  Continue BiPAP for now until able to wean, other treatment as below Active Problems:   Sepsis (HCC) -due to pneumonia, lactic acid within normal limits, blood pressure stable, treatment for respiratory status as above, IV antibiotics given, culture sent   CAP (community acquired pneumonia) -IV antibiotics and cultures as above, supportive treatment PRN   COPD with acute exacerbation (HCC) -IV Solu-Medrol in addition to IV antibiotics, scheduled nebulizers, supportive treatment PRN, continue home inhalers   Diabetes (HCC) -sliding scale insulin with corresponding glucose  checks   HTN (hypertension) -home dose antihypertensives  Chart review performed and case discussed with ED provider. Labs, imaging and/or ECG reviewed by provider and discussed with patient/family. Management plans discussed with the patient and/or family.  DVT PROPHYLAXIS: SubQ lovenox   GI PROPHYLAXIS:  PPI   ADMISSION STATUS: Inpatient     CODE STATUS: Full Code Status History    Date Active Date Inactive Code Status Order ID Comments User Context   12/10/2015 1228 12/13/2015 1910 Full Code [id number]  Alford Highland, MD ED      TOTAL CRITICAL CARE TIME TAKING CARE OF THIS PATIENT: 50 minutes.   Bricelyn Freestone FIELDING 07/08/2018, 9:53 PM  [employer]  [phone]  CC: Primary care physician; Emogene Morgan, MD  Note:  This  document was prepared using Systems analyst and may include unintentional dictation errors.

## 2018-07-09 ENCOUNTER — Inpatient Hospital Stay: Payer: Medicare Other

## 2018-07-09 LAB — BASIC METABOLIC PANEL
Anion gap: 7 (ref 5–15)
BUN: 18 mg/dL (ref 8–23)
CALCIUM: 8.9 mg/dL (ref 8.9–10.3)
CO2: 28 mmol/L (ref 22–32)
CREATININE: 0.82 mg/dL (ref 0.44–1.00)
Chloride: 105 mmol/L (ref 98–111)
GFR calc Af Amer: >60 mL/min (ref >60)
GFR calc non Af Amer: >60 mL/min (ref >60)
GLUCOSE: 194 mg/dL — AB (ref 70–99)
Potassium: 4.9 mmol/L (ref 3.5–5.1)
Sodium: 140 mmol/L (ref 135–145)

## 2018-07-09 LAB — GLUCOSE, CAPILLARY
Glucose-Capillary: 147 mg/dL — ABNORMAL HIGH (ref 70–99)
Glucose-Capillary: 138 mg/dL — ABNORMAL HIGH (ref 70–99)
Glucose-Capillary: 139 mg/dL — ABNORMAL HIGH (ref 70–99)
Glucose-Capillary: 158 mg/dL — ABNORMAL HIGH (ref 70–99)

## 2018-07-09 LAB — CBC
HCT: 45.9 % (ref 36.0–46.0)
Hemoglobin: 14.5 g/dL (ref 12.0–15.0)
MCH: 29.8 pg (ref 26.0–34.0)
MCHC: 31.6 g/dL (ref 30.0–36.0)
MCV: 94.3 fL (ref 80.0–100.0)
PLATELETS: 218 10*3/uL (ref 150–400)
RBC: 4.87 MIL/uL (ref 3.87–5.11)
RDW: 14.5 % (ref 11.5–15.5)
WBC: 9.2 10*3/uL (ref 4.0–10.5)
nRBC: 0 % (ref 0.0–0.2)

## 2018-07-09 MED ORDER — MOMETASONE FURO-FORMOTEROL FUM 100-5 MCG/ACT IN AERO
1.0000 | INHALATION_SPRAY | Freq: Two times a day (BID) | RESPIRATORY_TRACT | Status: DC
  Administered 2018-07-09 – 2018-07-11 (×5): 1 via RESPIRATORY_TRACT
  Filled 2018-07-09 (×2): qty 8.8

## 2018-07-09 MED ORDER — ACETAMINOPHEN 650 MG RE SUPP: 650.0000 mg | Freq: Four times a day (QID) | RECTAL | Status: DC | PRN

## 2018-07-09 MED ORDER — TIOTROPIUM BROMIDE MONOHYDRATE 18 MCG IN CAPS
18.0000 ug | ORAL_CAPSULE | Freq: Every day | RESPIRATORY_TRACT | Status: DC
  Administered 2018-07-09 – 2018-07-11 (×3): 18 ug via RESPIRATORY_TRACT
  Filled 2018-07-09: qty 5

## 2018-07-09 MED ORDER — IPRATROPIUM-ALBUTEROL 0.5-2.5 (3) MG/3ML IN SOLN
3.0000 mL | RESPIRATORY_TRACT | Status: DC | PRN
  Administered 2018-07-09 – 2018-07-10 (×3): 3 mL via RESPIRATORY_TRACT
  Filled 2018-07-09 (×3): qty 3

## 2018-07-09 MED ORDER — ENOXAPARIN SODIUM 40 MG/0.4ML ~~LOC~~ SOLN
SUBCUTANEOUS | Status: AC
  Filled 2018-07-09: qty 0.4

## 2018-07-09 MED ORDER — ONDANSETRON HCL 4 MG/2ML IJ SOLN: 4.0000 mg | Freq: Four times a day (QID) | INTRAMUSCULAR | Status: DC | PRN

## 2018-07-09 MED ORDER — INSULIN ASPART 100 UNIT/ML ~~LOC~~ SOLN
0.0000 [IU] | Freq: Three times a day (TID) | SUBCUTANEOUS | Status: DC
  Administered 2018-07-09: 2 [IU] via SUBCUTANEOUS
  Administered 2018-07-09: 1 [IU] via SUBCUTANEOUS
  Administered 2018-07-10 (×2): 2 [IU] via SUBCUTANEOUS
  Administered 2018-07-10 (×2): 1 [IU] via SUBCUTANEOUS
  Administered 2018-07-11: 2 [IU] via SUBCUTANEOUS
  Administered 2018-07-11: 1 [IU] via SUBCUTANEOUS
  Filled 2018-07-09 (×8): qty 1

## 2018-07-09 MED ORDER — ACETAMINOPHEN 325 MG PO TABS
650.0000 mg | ORAL_TABLET | Freq: Four times a day (QID) | ORAL | Status: DC | PRN
  Administered 2018-07-09 – 2018-07-11 (×4): 650 mg via ORAL
  Filled 2018-07-09 (×4): qty 2

## 2018-07-09 MED ORDER — SODIUM CHLORIDE 0.9 % IV SOLN
1.0000 g | Freq: Three times a day (TID) | INTRAVENOUS | Status: DC
  Administered 2018-07-09 – 2018-07-10 (×4): 1 g via INTRAVENOUS
  Filled 2018-07-09 (×7): qty 1

## 2018-07-09 MED ORDER — ORAL CARE MOUTH RINSE
15.0000 mL | Freq: Two times a day (BID) | OROMUCOSAL | Status: DC
  Administered 2018-07-09 – 2018-07-11 (×3): 15 mL via OROMUCOSAL

## 2018-07-09 MED ORDER — ASPIRIN EC 81 MG PO TBEC
81.0000 mg | DELAYED_RELEASE_TABLET | Freq: Every day | ORAL | Status: DC
  Administered 2018-07-09 – 2018-07-11 (×3): 81 mg via ORAL
  Filled 2018-07-09 (×3): qty 1

## 2018-07-09 MED ORDER — ENOXAPARIN SODIUM 40 MG/0.4ML ~~LOC~~ SOLN
40.0000 mg | Freq: Once | SUBCUTANEOUS | Status: AC
  Administered 2018-07-09: 40 mg via SUBCUTANEOUS

## 2018-07-09 MED ORDER — SODIUM CHLORIDE 0.9 % IV SOLN
INTRAVENOUS | Status: DC | PRN
  Administered 2018-07-09 – 2018-07-10 (×3): 250 mL via INTRAVENOUS

## 2018-07-09 MED ORDER — INSULIN ASPART 100 UNIT/ML ~~LOC~~ SOLN
0.0000 [IU] | Freq: Four times a day (QID) | SUBCUTANEOUS | Status: DC
  Administered 2018-07-09 (×2): 1 [IU] via SUBCUTANEOUS
  Filled 2018-07-09 (×2): qty 1

## 2018-07-09 MED ORDER — ENOXAPARIN SODIUM 40 MG/0.4ML ~~LOC~~ SOLN: 40.0000 mg | SUBCUTANEOUS | Status: DC

## 2018-07-09 MED ORDER — ONDANSETRON HCL 4 MG PO TABS: 4.0000 mg | ORAL_TABLET | Freq: Four times a day (QID) | ORAL | Status: DC | PRN

## 2018-07-09 NOTE — ED Notes (Signed)
Pt was able to stand and take a few steps to toilet. Pt did become winded with exertion. Pt places on dinamap to monitor 02 sat while up. Pt able to maintain sat above 91% while standing and moving on 3L 02.

## 2018-07-09 NOTE — ED Notes (Signed)
Dr Manson Passey made aware of pt's elevated Lactic Acid level as reported by lab at this time.

## 2018-07-09 NOTE — Progress Notes (Addendum)
[employer] - [place] at [facility]   PATIENT NAME: Katherine Carter    MR#:  [id number]  DATE OF BIRTH:  [birth year]  SUBJECTIVE:  CHIEF COMPLAINT:   Chief Complaint  Patient presents with  . Shortness of Breath  SOB, hypoxic, husband at bedside REVIEW OF SYSTEMS:  Review of Systems  Constitutional: Negative for diaphoresis, fever, malaise/fatigue and weight loss.  HENT: Negative for ear discharge, ear pain, hearing loss, nosebleeds, sore throat and tinnitus.   Eyes: Negative for blurred vision and pain.  Respiratory: Positive for shortness of breath. Negative for cough, hemoptysis and wheezing.   Cardiovascular: Negative for chest pain, palpitations, orthopnea and leg swelling.  Gastrointestinal: Negative for abdominal pain, blood in stool, constipation, diarrhea, heartburn, nausea and vomiting.  Genitourinary: Negative for dysuria, frequency and urgency.  Musculoskeletal: Negative for back pain and myalgias.  Skin: Negative for itching and rash.  Neurological: Negative for dizziness, tingling, tremors, focal weakness, seizures, weakness and headaches.  Psychiatric/Behavioral: Negative for depression. The patient is not nervous/anxious.    DRUG ALLERGIES:   Allergies  Allergen Reactions  . Penicillins Hives and Other (See Comments)    Has patient had a PCN reaction causing immediate rash, facial/tongue/throat swelling, SOB or lightheadedness with hypotension: No Has patient had a PCN reaction causing severe rash involving mucus membranes or skin necrosis: No Has patient had a PCN reaction that required hospitalization: No Has patient had a PCN reaction occurring within the last 10 years: No If all of the above answers are "NO", then may proceed with Cephalosporin use.    VITALS:  Blood pressure 140/86, pulse (!) 110, temperature 98.8 F (37.1 C), temperature source Oral, resp. rate 16, height 5' (1.524 m), weight 68 kg, SpO2 92 %. PHYSICAL EXAMINATION:    Physical Exam  Constitutional: She is oriented to person, place, and time.  HENT:  Head: Normocephalic and atraumatic.  Eyes: Pupils are equal, round, and reactive to light. Conjunctivae and EOM are normal.  Neck: Normal range of motion. Neck supple. No tracheal deviation present. No thyromegaly present.  Cardiovascular: Regular rhythm and normal heart sounds. Tachycardia present.  Pulmonary/Chest: Accessory muscle usage present. No respiratory distress. She has decreased breath sounds. She has wheezes. She has rhonchi. She exhibits no tenderness.  Abdominal: Soft. Bowel sounds are normal. She exhibits no distension. There is no tenderness.  Musculoskeletal: Normal range of motion.  Neurological: She is alert and oriented to person, place, and time. No cranial nerve deficit.  Skin: Skin is warm and dry. No rash noted.   LABORATORY PANEL:  Female CBC Recent Labs  Lab 07/09/18 0557  WBC 9.2  HGB 14.5  HCT 45.9  PLT 218   ------------------------------------------------------------------------------------------------------------------ Chemistries  Recent Labs  Lab 07/08/18 1952 07/09/18 0557  NA 142 140  K 4.7 4.9  CL 106 105  CO2 28 28  GLUCOSE 157* 194*  BUN 15 18  CREATININE 0.69 0.82  CALCIUM 9.5 8.9  AST 28  --   ALT 24  --   ALKPHOS 97  --   BILITOT 0.5  --    RADIOLOGY:  Dg Chest Portable 1 View  Result Date: 07/08/2018 CLINICAL DATA:  Initial evaluation for acute shortness of breath. EXAM: PORTABLE CHEST 1 VIEW COMPARISON:  Prior radiograph from 03/09/2018. FINDINGS: Cardiomegaly grossly stable from previous. Mediastinal silhouette within normal limits. Aortic atherosclerosis. Lungs are hypoinflated. Probable underlying emphysematous changes. Patchy and hazy bibasilar opacities, right greater than left, suspicious for possible infiltrates. No definite  pleural effusions, although the costophrenic angles are incompletely visualized. No overt pulmonary edema. No  pneumothorax. Visualized osseous structures within normal limits. IMPRESSION: 1. Patchy bilateral airspace opacities, right greater than left, suspicious for possible infiltrates. 2. Suspected underlying emphysematous changes. 3. Aortic atherosclerosis. Electronically Signed   By: Rise Mu M.D.   On: 07/08/2018 20:14   ASSESSMENT AND PLAN:  68 y f with COPD exacerbation   * Acute respiratory failure with hypoxia and hypercapnia (HCC) -off BiPAP, minimal exertion makes her SOB and hypoxic, on N.C. - Will obtain CT chest   * Sepsis (HCC) -Present on admission due to pneumonia, lactic acid within normal limits, blood pressure stable, treatment for respiratory status as above, IV antibiotics given, culture sent    * CAP (community acquired pneumonia) -IV antibiotics and cultures as above, supportive treatment PRN  * COPD with acute exacerbation (HCC) -IV Solu-Medrol in addition to IV antibiotics, scheduled nebulizers, supportive treatment PRN, continue home inhalers  * Diabetes (HCC) -sliding scale insulin with corresponding glucose checks   * HTN (hypertension) -home dose antihypertensives     All the records are reviewed and case discussed with Care Management/Social Worker. Management plans discussed with the patient, family (husband at bedside) and they are in agreement.  CODE STATUS: Full Code  TOTAL TIME TAKING CARE OF THIS PATIENT: 35 minutes.   More than 50% of the time was spent in counseling/coordination of care: YES  POSSIBLE D/C IN 1-2 DAYS, DEPENDING ON CLINICAL CONDITION.   Delfino Lovett M.D on 07/09/2018 at 4:38 PM  Between 7am to 6pm - Pager - [phone]  After 6pm go to [email or web address] - Scientist, research (life sciences) [place] [facility]  Office  [phone]  CC: Primary care physician; Emogene Morgan, MD  Note: This dictation was prepared with Dragon dictation along with smaller phrase technology. Any transcriptional errors that result from  this process are unintentional.

## 2018-07-09 NOTE — ED Notes (Signed)
Pharmacy contacted regarding current medications due, pharmacy states they will tube up medications for pt.

## 2018-07-09 NOTE — ED Notes (Signed)
Attempted to call report at this time. Charge RN to call back in 10 mins

## 2018-07-09 NOTE — ED Notes (Addendum)
bipap removed at 910 to give Asprin, pt seems to be doing well without bipap. Pt placed on 2L O2 nasal canula and maintaining a saturation between 93 and 96% at this time

## 2018-07-09 NOTE — Progress Notes (Signed)
Pharmacy Antibiotic Note  Lucile Hillmann is a 67 y.o. female admitted on 07/08/2018 with pneumonia.  Pharmacy has been consulted for meropenem dosing.  Plan: Will start meropenem 1g IV q8h  Height: 5' (152.4 cm) Weight: 150 lb (68 kg) IBW/kg (Calculated) : 45.5  Temp (24hrs), Avg:98.9 F (37.2 C), Min:98.9 F (37.2 C), Max:98.9 F (37.2 C)  Recent Labs  Lab 07/08/18 1952 07/08/18 1953  WBC 15.8*  --   CREATININE 0.69  --   LATICACIDVEN  --  1.5    Estimated Creatinine Clearance: 58.7 mL/min (by C-G formula based on SCr of 0.69 mg/dL).    Allergies  Allergen Reactions  . Penicillins Hives and Other (See Comments)    Has patient had a PCN reaction causing immediate rash, facial/tongue/throat swelling, SOB or lightheadedness with hypotension: No Has patient had a PCN reaction causing severe rash involving mucus membranes or skin necrosis: No Has patient had a PCN reaction that required hospitalization: No Has patient had a PCN reaction occurring within the last 10 years: No If all of the above answers are "NO", then may proceed with Cephalosporin use.     Thank you for allowing pharmacy to be a part of this patient's care.  Thomasene Ripple, PharmD, BCPS Clinical Pharmacist 07/09/2018

## 2018-07-10 LAB — CBC
HEMATOCRIT: 45.7 % (ref 36.0–46.0)
HEMOGLOBIN: 14.3 g/dL (ref 12.0–15.0)
MCH: 29.7 pg (ref 26.0–34.0)
MCHC: 31.3 g/dL (ref 30.0–36.0)
MCV: 94.8 fL (ref 80.0–100.0)
Platelets: 216 10*3/uL (ref 150–400)
RBC: 4.82 MIL/uL (ref 3.87–5.11)
RDW: 14.6 % (ref 11.5–15.5)
WBC: 11.5 10*3/uL — AB (ref 4.0–10.5)
nRBC: 0 % (ref 0.0–0.2)

## 2018-07-10 LAB — BASIC METABOLIC PANEL
Anion gap: 6 (ref 5–15)
BUN: 22 mg/dL (ref 8–23)
CHLORIDE: 105 mmol/L (ref 98–111)
CO2: 30 mmol/L (ref 22–32)
CREATININE: 0.61 mg/dL (ref 0.44–1.00)
Calcium: 9.1 mg/dL (ref 8.9–10.3)
GFR calc non Af Amer: >60 mL/min (ref >60)
Glucose, Bld: 170 mg/dL — ABNORMAL HIGH (ref 70–99)
POTASSIUM: 4.5 mmol/L (ref 3.5–5.1)
SODIUM: 141 mmol/L (ref 135–145)

## 2018-07-10 LAB — GLUCOSE, CAPILLARY
GLUCOSE-CAPILLARY: 170 mg/dL — AB (ref 70–99)
GLUCOSE-CAPILLARY: 129 mg/dL — AB (ref 70–99)
Glucose-Capillary: 151 mg/dL — ABNORMAL HIGH (ref 70–99)
Glucose-Capillary: 137 mg/dL — ABNORMAL HIGH (ref 70–99)

## 2018-07-10 LAB — HIV ANTIBODY (ROUTINE TESTING W REFLEX): HIV SCREEN 4TH GENERATION: NONREACTIVE

## 2018-07-10 MED ORDER — GUAIFENESIN-DM 100-10 MG/5ML PO SYRP
5.0000 mL | ORAL_SOLUTION | ORAL | Status: DC | PRN
  Administered 2018-07-10 – 2018-07-11 (×4): 5 mL via ORAL
  Filled 2018-07-10 (×4): qty 5

## 2018-07-10 MED ORDER — SODIUM CHLORIDE 0.9 % IV SOLN
1.0000 g | INTRAVENOUS | Status: DC
  Administered 2018-07-10 – 2018-07-11 (×2): 1 g via INTRAVENOUS
  Filled 2018-07-10: qty 1
  Filled 2018-07-10: qty 10
  Filled 2018-07-10: qty 1

## 2018-07-10 MED ORDER — METOPROLOL TARTRATE 25 MG PO TABS
25.0000 mg | ORAL_TABLET | Freq: Two times a day (BID) | ORAL | Status: DC
  Administered 2018-07-10 (×2): 25 mg via ORAL
  Filled 2018-07-10 (×2): qty 1

## 2018-07-10 NOTE — Progress Notes (Addendum)
.  SATURATION QUALIFICATIONS: (This note is used to comply with regulatory documentation for home oxygen)  Patient Saturations on Room Air while Ambulating = 87%  Patient Saturations on 2 Liters of oxygen while at rest = 93%

## 2018-07-10 NOTE — Progress Notes (Signed)
[employer] - [place] at [facility]   PATIENT NAME: Katherine Carter    MR#:  [id number]  DATE OF BIRTH:  1951-02-17  SUBJECTIVE:  CHIEF COMPLAINT:   Chief Complaint  Patient presents with  . Shortness of Breath  Sitting in bed, gets dyspneic on minimal exertion REVIEW OF SYSTEMS:  Review of Systems  Constitutional: Negative for diaphoresis, fever, malaise/fatigue and weight loss.  HENT: Negative for ear discharge, ear pain, hearing loss, nosebleeds, sore throat and tinnitus.   Eyes: Negative for blurred vision and pain.  Respiratory: Positive for shortness of breath. Negative for cough, hemoptysis and wheezing.   Cardiovascular: Negative for chest pain, palpitations, orthopnea and leg swelling.  Gastrointestinal: Negative for abdominal pain, blood in stool, constipation, diarrhea, heartburn, nausea and vomiting.  Genitourinary: Negative for dysuria, frequency and urgency.  Musculoskeletal: Negative for back pain and myalgias.  Skin: Negative for itching and rash.  Neurological: Negative for dizziness, tingling, tremors, focal weakness, seizures, weakness and headaches.  Psychiatric/Behavioral: Negative for depression. The patient is not nervous/anxious.    DRUG ALLERGIES:   Allergies  Allergen Reactions  . Penicillins Hives and Other (See Comments)    Has patient had a PCN reaction causing immediate rash, facial/tongue/throat swelling, SOB or lightheadedness with hypotension: No Has patient had a PCN reaction causing severe rash involving mucus membranes or skin necrosis: No Has patient had a PCN reaction that required hospitalization: No Has patient had a PCN reaction occurring within the last 10 years: No If all of the above answers are "NO", then may proceed with Cephalosporin use.    VITALS:  Blood pressure (!) 144/92, pulse 72, temperature 98.8 F (37.1 C), temperature source Oral, resp. rate 16, height 5' (1.524 m), weight 68 kg, SpO2 95  %. PHYSICAL EXAMINATION:  Physical Exam  Constitutional: She is oriented to person, place, and time.  HENT:  Head: Normocephalic and atraumatic.  Eyes: Pupils are equal, round, and reactive to light. Conjunctivae and EOM are normal.  Neck: Normal range of motion. Neck supple. No tracheal deviation present. No thyromegaly present.  Cardiovascular: Regular rhythm and normal heart sounds. Tachycardia present.  Pulmonary/Chest: Accessory muscle usage present. No respiratory distress. She has decreased breath sounds. She has wheezes. She has rhonchi. She exhibits no tenderness.  Abdominal: Soft. Bowel sounds are normal. She exhibits no distension. There is no tenderness.  Musculoskeletal: Normal range of motion.  Neurological: She is alert and oriented to person, place, and time. No cranial nerve deficit.  Skin: Skin is warm and dry. No rash noted.   LABORATORY PANEL:  Female CBC Recent Labs  Lab 07/10/18 0302  WBC 11.5*  HGB 14.3  HCT 45.7  PLT 216   ------------------------------------------------------------------------------------------------------------------ Chemistries  Recent Labs  Lab 07/08/18 1952  07/10/18 0302  NA 142   < > 141  K 4.7   < > 4.5  CL 106   < > 105  CO2 28   < > 30  GLUCOSE 157*   < > 170*  BUN 15   < > 22  CREATININE 0.69   < > 0.61  CALCIUM 9.5   < > 9.1  AST 28  --   --   ALT 24  --   --   ALKPHOS 97  --   --   BILITOT 0.5  --   --    < > = values in this interval not displayed.   RADIOLOGY:  No results found. ASSESSMENT AND PLAN:  3 y f with COPD exacerbation   * Acute respiratory failure with hypoxia and hypercapnia (HCC) -off BiPAP, minimal exertion makes her SOB and hypoxic, on N.C. - CT chest shows advanced emphysema. Will likely need O2 at D/C   * Sepsis (HCC) -Present on admission due to pneumonia, lactic acid within normal limits, blood pressure stable, treatment for respiratory status as above, IV antibiotics given, culture sent     * CAP (community acquired pneumonia) -IV antibiotics and cultures as above, supportive treatment PRN  * COPD with acute exacerbation (HCC) -IV Solu-Medrol in addition to IV antibiotics, scheduled nebulizers, supportive treatment PRN, continue home inhalers  * Diabetes (HCC) -sliding scale insulin with corresponding glucose checks   * HTN (hypertension) -home dose antihypertensives     All the records are reviewed and case discussed with Care Management/Social Worker. Management plans discussed with the patient, nursing and they are in agreement.  CODE STATUS: Full Code  TOTAL TIME TAKING CARE OF THIS PATIENT: 35 minutes.   More than 50% of the time was spent in counseling/coordination of care: YES  POSSIBLE D/C IN 1 DAYS, DEPENDING ON CLINICAL CONDITION.   Delfino Lovett M.D on 07/10/2018 at 6:21 PM  Between 7am to 6pm - Pager - [phone]  After 6pm go to [email or web address] - Scientist, research (life sciences) [place] [facility]  Office  [phone]  CC: Primary care physician; Emogene Morgan, MD  Note: This dictation was prepared with Dragon dictation along with smaller phrase technology. Any transcriptional errors that result from this process are unintentional.

## 2018-07-11 LAB — CBC
HCT: 47.2 % — ABNORMAL HIGH (ref 36.0–46.0)
Hemoglobin: 14.8 g/dL (ref 12.0–15.0)
MCH: 29.4 pg (ref 26.0–34.0)
MCHC: 31.4 g/dL (ref 30.0–36.0)
MCV: 93.8 fL (ref 80.0–100.0)
PLATELETS: 239 10*3/uL (ref 150–400)
RBC: 5.03 MIL/uL (ref 3.87–5.11)
RDW: 14.4 % (ref 11.5–15.5)
WBC: 11.8 10*3/uL — ABNORMAL HIGH (ref 4.0–10.5)
nRBC: 0 % (ref 0.0–0.2)

## 2018-07-11 LAB — GLUCOSE, CAPILLARY
GLUCOSE-CAPILLARY: 163 mg/dL — AB (ref 70–99)
GLUCOSE-CAPILLARY: 141 mg/dL — AB (ref 70–99)
Glucose-Capillary: 148 mg/dL — ABNORMAL HIGH (ref 70–99)

## 2018-07-11 LAB — BASIC METABOLIC PANEL
Anion gap: 8 (ref 5–15)
BUN: 26 mg/dL — AB (ref 8–23)
CO2: 31 mmol/L (ref 22–32)
CREATININE: 0.46 mg/dL (ref 0.44–1.00)
Calcium: 9.3 mg/dL (ref 8.9–10.3)
Chloride: 103 mmol/L (ref 98–111)
Glucose, Bld: 165 mg/dL — ABNORMAL HIGH (ref 70–99)
Potassium: 4.4 mmol/L (ref 3.5–5.1)
SODIUM: 142 mmol/L (ref 135–145)

## 2018-07-11 MED ORDER — DOXYCYCLINE HYCLATE 100 MG PO CAPS: 100.0000 mg | ORAL_CAPSULE | Freq: Two times a day (BID) | ORAL | 0 refills | Status: DC

## 2018-07-11 MED ORDER — AMLODIPINE BESYLATE 5 MG PO TABS: 5.0000 mg | ORAL_TABLET | Freq: Every day | ORAL | 0 refills | Status: DC

## 2018-07-11 MED ORDER — PREDNISONE 10 MG (21) PO TBPK: ORAL_TABLET | ORAL | 0 refills | Status: DC

## 2018-07-11 MED ORDER — AMLODIPINE BESYLATE 5 MG PO TABS
5.0000 mg | ORAL_TABLET | Freq: Every day | ORAL | Status: DC
  Administered 2018-07-11: 5 mg via ORAL
  Filled 2018-07-11 (×2): qty 1

## 2018-07-11 MED ORDER — GUAIFENESIN-DM 100-10 MG/5ML PO SYRP: 5.0000 mL | ORAL_SOLUTION | ORAL | 0 refills | Status: DC | PRN

## 2018-07-11 MED ORDER — METHYLPREDNISOLONE SODIUM SUCC 125 MG IJ SOLR: 60.0000 mg | Freq: Two times a day (BID) | INTRAMUSCULAR | Status: DC

## 2018-07-11 NOTE — Discharge Instructions (Signed)

## 2018-07-11 NOTE — Progress Notes (Addendum)
  Room Air at rest = 93%     SATURATION QUALIFICATIONS: (This note is used to comply with regulatory documentation for home oxygen)  Patient Saturations on Room Air at Rest = 93%  Patient Saturations on Room Air while Ambulating = 87 %  Patient Saturations on _3____ Liters of oxygen while Ambulating = 91%  Please briefly explain why patient needs home oxygen:

## 2018-07-11 NOTE — Care Management Important Message (Signed)
Copy of signed IM left with patient in room.  

## 2018-07-11 NOTE — Progress Notes (Signed)
Notified dr.sridharan of b/p 190/97. Acknowledged and new orders placed.

## 2018-07-11 NOTE — Care Management Note (Signed)
Case Management Note  Patient Details  Name: Katherine Carter MRN: [id number] Date of Birth: [birth year]   Patient discharged home today.  Confirmed that patient does have continuous O2 with [facility].  However she is stating that she does not have portable O2.  Patient lives at home with husband.  PCP [facility].  Pharmacy [facility].  Patient denies any issues obtaining medications.  Husband to transport at discharge.  Nebulizer and portable O2 tank delivered to room by Barbara Cower with [facility].  Home health order has been written for.  Patient agreeable to home health services and states she does not have a preference of agency.  Referral made to [facility] with [facility].   Subjective/Objective:                    Action/Plan:   Expected Discharge Date:  07/11/18               Expected Discharge Plan:  Home w Home Health Services  In-House Referral:     Discharge planning Services  CM Consult  Post Acute Care Choice:  Durable Medical Equipment, Home Health Choice offered to:  Patient  DME Arranged:  Nebulizer/meds DME Agency:  [facility]  HH Arranged:  RN, PT, Nurse's Aide, Respirator Therapy HH Agency:  [facility]  Status of Service:  Completed, signed off  If discussed at Long Length of Stay Meetings, dates discussed:    Additional Comments:  Chapman Fitch, RN 07/11/2018, 5:05 PM

## 2018-07-11 NOTE — Progress Notes (Signed)
Discharge order received. Patient is alert and oriented. Vital signs stable . No signs of acute distress. Discharge instructions given. Patient verbalized understanding. No other issues noted at this time.   

## 2018-07-12 NOTE — Discharge Summary (Signed)
[employer] -  at [facility]   PATIENT NAME: Katherine Carter    MR#:  [id number]  DATE OF BIRTH:  04-12-1951  DATE OF ADMISSION:  07/08/2018   ADMITTING PHYSICIAN: Oralia Manis, MD  DATE OF DISCHARGE: 07/11/2018  5:24 PM  PRIMARY CARE PHYSICIAN: Emogene Morgan, MD   ADMISSION DIAGNOSIS:  COPD exacerbation (HCC) [J44.1] Acute respiratory failure with hypoxia and hypercapnia (HCC) [J96.01, J96.02] Community acquired pneumonia, unspecified laterality [J18.9] DISCHARGE DIAGNOSIS:  Principal Problem:   Acute respiratory failure with hypoxia and hypercapnia (HCC) Active Problems:   Sepsis (HCC)   CAP (community acquired pneumonia)   Diabetes (HCC)   HTN (hypertension)   COPD with acute exacerbation (HCC)  SECONDARY DIAGNOSIS:   Past Medical History:  Diagnosis Date  . COPD (chronic obstructive pulmonary disease) (HCC)   . Diabetes mellitus without complication (HCC)   . Hypertension    HOSPITAL COURSE:  67 y f with COPD exacerbation  * Acute respiratory failure with hypoxia and hypercapnia (HCC) -initially required BiPAP but weaned off to [facility]. And is qualified for 2 liters at DC  *Sepsis (HCC) -Present on admission due to pneumonia,resolved with treatment  * CAP (community acquired pneumonia) - improved with Abx  *COPD with acute exacerbation (HCC) - improved with Nebs and steroids  *Diabetes - controlled  *HTN (hypertension) -home dose antihypertensives DISCHARGE CONDITIONS:  stable CONSULTS OBTAINED:   DRUG ALLERGIES:   Allergies  Allergen Reactions  . Penicillins Hives and Other (See Comments)    Has patient had a PCN reaction causing immediate rash, facial/tongue/throat swelling, SOB or lightheadedness with hypotension: No Has patient had a PCN reaction causing severe rash involving mucus membranes or skin necrosis: No Has patient had a PCN reaction that required hospitalization: No Has patient had a PCN reaction  occurring within the last 10 years: No If all of the above answers are "NO", then may proceed with Cephalosporin use.    DISCHARGE MEDICATIONS:   Allergies as of 07/11/2018      Reactions   Penicillins Hives, Other (See Comments)   Has patient had a PCN reaction causing immediate rash, facial/tongue/throat swelling, SOB or lightheadedness with hypotension: No Has patient had a PCN reaction causing severe rash involving mucus membranes or skin necrosis: No Has patient had a PCN reaction that required hospitalization: No Has patient had a PCN reaction occurring within the last 10 years: No If all of the above answers are "NO", then may proceed with Cephalosporin use.      Medication List    STOP taking these medications   predniSONE 10 MG tablet Commonly known as:  DELTASONE Replaced by:  predniSONE 10 MG (21) Tbpk tablet     TAKE these medications   acetaminophen 325 MG tablet Commonly known as:  TYLENOL Take 2 tablets (650 mg total) by mouth every 6 (six) hours as needed for mild pain (or Fever >/= 101).   amLODipine 5 MG tablet Commonly known as:  NORVASC Take 1 tablet (5 mg total) by mouth daily.   aspirin EC 81 MG tablet Take 81 mg by mouth daily.   benzonatate 100 MG capsule Commonly known as:  TESSALON Take 100 mg by mouth 3 (three) times daily as needed for cough.   budesonide-formoterol 160-4.5 MCG/ACT inhaler Commonly known as:  SYMBICORT Inhale 2 puffs into the lungs 2 (two) times daily.   diclofenac 75 MG EC tablet Commonly known as:  VOLTAREN Take 75 mg by mouth 2 (two) times  daily as needed.   doxycycline 100 MG capsule Commonly known as:  VIBRAMYCIN Take 1 capsule (100 mg total) by mouth 2 (two) times daily.   fluticasone 50 MCG/ACT nasal spray Commonly known as:  FLONASE Place 1 spray into both nostrils 2 (two) times daily.   guaiFENesin-dextromethorphan 100-10 MG/5ML syrup Commonly known as:  ROBITUSSIN DM Take 5 mLs by mouth every 4 (four)  hours as needed for cough.   ipratropium-albuterol 0.5-2.5 (3) MG/3ML Soln Commonly known as:  DUONEB Take 3 mLs by nebulization every 4 (four) hours as needed.   metFORMIN 500 MG 24 hr tablet Commonly known as:  GLUCOPHAGE-XR Take 500 mg by mouth daily with breakfast.   predniSONE 10 MG (21) Tbpk tablet Commonly known as:  STERAPRED UNI-PAK 21 TAB Start 60 mg once daily, taper 10 mg daily until done Replaces:  predniSONE 10 MG tablet   tiotropium 18 MCG inhalation capsule Commonly known as:  SPIRIVA Place 1 capsule (18 mcg total) into inhaler and inhale daily. What changed:    when to take this  reasons to take this   XOPENEX HFA 45 MCG/ACT inhaler Generic drug:  levalbuterol Inhale 2 puffs into the lungs every 4 (four) hours as needed for wheezing.        DISCHARGE INSTRUCTIONS:   DIET:  Regular diet DISCHARGE CONDITION:  Good ACTIVITY:  Activity as tolerated OXYGEN:  Home Oxygen: Yes.    Oxygen Delivery: 2 liters/min via Patient connected to nasal cannula oxygen DISCHARGE LOCATION:  Home with home health, PT, RN   If you experience worsening of your admission symptoms, develop shortness of breath, life threatening emergency, suicidal or homicidal thoughts you must seek medical attention immediately by calling 911 or calling your MD immediately  if symptoms less severe.  You Must read complete instructions/literature along with all the possible adverse reactions/side effects for all the Medicines you take and that have been prescribed to you. Take any new Medicines after you have completely understood and accpet all the possible adverse reactions/side effects.   Please note  You were cared for by a hospitalist during your hospital stay. If you have any questions about your discharge medications or the care you received while you were in the hospital after you are discharged, you can call the unit and asked to speak with the hospitalist on call if the hospitalist  that took care of you is not available. Once you are discharged, your primary care physician will handle any further medical issues. Please note that NO REFILLS for any discharge medications will be authorized once you are discharged, as it is imperative that you return to your primary care physician (or establish a relationship with a primary care physician if you do not have one) for your aftercare needs so that they can reassess your need for medications and monitor your lab values.    On the day of Discharge:  VITAL SIGNS:  Blood pressure (!) 144/88, pulse (!) 107, temperature 98.4 F (36.9 C), temperature source Oral, resp. rate 20, height 5' (1.524 m), weight 68 kg, SpO2 93 %. PHYSICAL EXAMINATION:  GENERAL:  67 y.o.-year-old patient lying in the bed with no acute distress.  EYES: Pupils equal, round, reactive to light and accommodation. No scleral icterus. Extraocular muscles intact.  HEENT: Head atraumatic, normocephalic. Oropharynx and nasopharynx clear.  NECK:  Supple, no jugular venous distention. No thyroid enlargement, no tenderness.  LUNGS: Normal breath sounds bilaterally, no wheezing, rales,rhonchi or crepitation. No use of accessory muscles  of respiration.  CARDIOVASCULAR: S1, S2 normal. No murmurs, rubs, or gallops.  ABDOMEN: Soft, non-tender, non-distended. Bowel sounds present. No organomegaly or mass.  EXTREMITIES: No pedal edema, cyanosis, or clubbing.  NEUROLOGIC: Cranial nerves II through XII are intact. Muscle strength 5/5 in all extremities. Sensation intact. Gait not checked.  PSYCHIATRIC: The patient is alert and oriented x 3.  SKIN: No obvious rash, lesion, or ulcer.  DATA REVIEW:   CBC Recent Labs  Lab 07/11/18 0333  WBC 11.8*  HGB 14.8  HCT 47.2*  PLT 239    Chemistries  Recent Labs  Lab 07/08/18 1952  07/11/18 0333  NA 142   < > 142  K 4.7   < > 4.4  CL 106   < > 103  CO2 28   < > 31  GLUCOSE 157*   < > 165*  BUN 15   < > 26*  CREATININE 0.69    < > 0.46  CALCIUM 9.5   < > 9.3  AST 28  --   --   ALT 24  --   --   ALKPHOS 97  --   --   BILITOT 0.5  --   --    < > = values in this interval not displayed.     Follow-up Information    Emogene Morgan, MD. Go on 07/18/2018.   Specialty:  Family Medicine Why:  Thursday November 14th at 10:20am for a follow-up  Contact information: [address] [place] [address] [phone]        Mertie Moores, MD. Go on 07/25/2018.   Specialty:  Specialist Why:  Thursday November 21th at [facility] for a follow-up  Contact information: [address] [place] [address] [phone]           Management plans discussed with the patient, family and they are in agreement.  CODE STATUS: Prior   TOTAL TIME TAKING CARE OF THIS PATIENT: 45 minutes.    Delfino Lovett M.D on 07/12/2018 at 7:38 PM  Between 7am to 6pm - Pager - [phone]  After 6pm go to [email or web address] - Scientist, research (life sciences) [facility]  Office  [phone]  CC: Primary care physician; Emogene Morgan, MD   Note: This dictation was prepared with Dragon dictation along with smaller phrase technology. Any transcriptional errors that result from this process are unintentional.

## 2018-07-13 LAB — CULTURE, BLOOD (ROUTINE X 2)
CULTURE: NO GROWTH
CULTURE: NO GROWTH
SPECIAL REQUESTS: ADEQUATE

## 2018-07-15 ENCOUNTER — Telehealth: Payer: Self-pay

## 2018-07-15 NOTE — Telephone Encounter (Signed)
Flagged on EMMI report for not reading discharge papers.  First attempt to reach patient made, however unable to reach patient.  Left voicemail encouraging callback. Will attempt at later time.

## 2018-07-16 NOTE — Telephone Encounter (Signed)
Second attempt made, however unable to reach.  Left another message encouraging callback for any questions or issues.  No further attempts at this time.

## 2019-05-12 ENCOUNTER — Encounter: Payer: Self-pay | Admitting: Emergency Medicine

## 2019-05-12 ENCOUNTER — Other Ambulatory Visit: Payer: Self-pay

## 2019-05-12 ENCOUNTER — Emergency Department
Admission: EM | Admit: 2019-05-12 | Discharge: 2019-05-12 | Disposition: A | Discharge status: Home / Self Care | Payer: Medicare Other | Attending: Emergency Medicine | Admitting: Emergency Medicine

## 2019-05-12 DIAGNOSIS — R51 Headache: Secondary | ICD-10-CM | POA: Diagnosis present

## 2019-05-12 DIAGNOSIS — Z7984 Long term (current) use of oral hypoglycemic drugs: Secondary | ICD-10-CM | POA: Insufficient documentation

## 2019-05-12 DIAGNOSIS — Z79899 Other long term (current) drug therapy: Secondary | ICD-10-CM | POA: Diagnosis not present

## 2019-05-12 DIAGNOSIS — Z880 Allergy status to penicillin: Secondary | ICD-10-CM | POA: Diagnosis not present

## 2019-05-12 DIAGNOSIS — B9689 Other specified bacterial agents as the cause of diseases classified elsewhere: Secondary | ICD-10-CM

## 2019-05-12 DIAGNOSIS — J329 Chronic sinusitis, unspecified: Secondary | ICD-10-CM | POA: Insufficient documentation

## 2019-05-12 DIAGNOSIS — E119 Type 2 diabetes mellitus without complications: Secondary | ICD-10-CM | POA: Insufficient documentation

## 2019-05-12 DIAGNOSIS — Z87891 Personal history of nicotine dependence: Secondary | ICD-10-CM | POA: Insufficient documentation

## 2019-05-12 DIAGNOSIS — Z7982 Long term (current) use of aspirin: Secondary | ICD-10-CM | POA: Insufficient documentation

## 2019-05-12 DIAGNOSIS — J449 Chronic obstructive pulmonary disease, unspecified: Secondary | ICD-10-CM | POA: Diagnosis not present

## 2019-05-12 DIAGNOSIS — R519 Headache, unspecified: Secondary | ICD-10-CM

## 2019-05-12 DIAGNOSIS — I10 Essential (primary) hypertension: Secondary | ICD-10-CM | POA: Diagnosis not present

## 2019-05-12 MED ORDER — PROMETHAZINE HCL 25 MG/ML IJ SOLN
12.5000 mg | Freq: Once | INTRAMUSCULAR | Status: AC
  Administered 2019-05-12: 16:00:00 12.5 mg via INTRAMUSCULAR
  Filled 2019-05-12: qty 1

## 2019-05-12 MED ORDER — DOXYCYCLINE HYCLATE 100 MG PO TABS
100.0000 mg | ORAL_TABLET | Freq: Once | ORAL | Status: AC
  Administered 2019-05-12: 16:00:00 100 mg via ORAL
  Filled 2019-05-12: qty 1

## 2019-05-12 MED ORDER — KETOROLAC TROMETHAMINE 30 MG/ML IJ SOLN
15.0000 mg | Freq: Once | INTRAMUSCULAR | Status: AC
  Administered 2019-05-12: 15 mg via INTRAMUSCULAR
  Filled 2019-05-12: qty 1

## 2019-05-12 MED ORDER — DOXYCYCLINE HYCLATE 100 MG PO TABS: 100.0000 mg | ORAL_TABLET | Freq: Two times a day (BID) | ORAL | 0 refills | Status: DC

## 2019-05-12 MED ORDER — DIPHENHYDRAMINE HCL 50 MG/ML IJ SOLN
50.0000 mg | Freq: Once | INTRAMUSCULAR | Status: AC
  Administered 2019-05-12: 16:00:00 50 mg via INTRAMUSCULAR
  Filled 2019-05-12: qty 1

## 2019-05-12 NOTE — ED Triage Notes (Signed)
Discussed with dr Kerman Passey, no protocols at this time.

## 2019-05-12 NOTE — ED Triage Notes (Signed)
Pt here for intermittent headache since Thursday.  No history of headaches.  Reports bp has been up.  Saw dr flemming last Thursday and he put her on omeprazole and pt reports sx started after that.  Has taken bp meds today.  She stopped taking her losartan for a couple days prior to symptoms and then started taking again.  Today head is feeling better than has been.  Denies CP/SHOB/vision changes.

## 2019-05-12 NOTE — ED Provider Notes (Signed)
[facility] Emergency Department Provider Note  ____________________________________________  Time seen: Approximately 3:27 PM  I have reviewed the triage vital signs and the nursing notes.   HISTORY  Chief Complaint Headache    HPI Katherine Carter is a 68 y.o. female who presents the emergency department concern about a left frontal headache.  Patient reports that she was started on a new medication by her primary care 4 days ago.  Patient reports that it was for her "anxiety."  When asked about the prescription patient presents a bottle of omeprazole.  Patient reports that because she is taking her "anxiety medication" she stopped her blood pressure medications at home.  Since stopping her blood pressure medication and starting the omeprazole she is developed a left-sided headache.  Patient reports that she was also started on 2 different allergy medications for nasal congestion/allergic rhinitis.  Patient denies any trauma to the head.  She denies any difficulty formulating thoughts or words.  No weakness on one side of the body or the other.  Patient reports that the headache has been intermittent in nature.  She has not been taking any medications for the headache.  Patient denies any head trauma, neck pain or stiffness, fevers or chills, sore throat, cough, abdominal pain, nausea vomiting.  Patient states that she became overly concerned as her blood pressure has been steadily increasing every day.  Patient reports that she had a reading of 200/100 at home, became concerned, took her dose of losartan prior to arrival in the emergency department.  Headache has been in the same place in the left frontal region.  Patient does endorse some sinus pressure as well.   Patient has a history of COPD, diabetes, hypertension.  Patient has had issues with hypertension as described above but no complaints with her COPD or diabetes.       Past Medical History:   Diagnosis Date  . COPD (chronic obstructive pulmonary disease) (HCC)   . Diabetes mellitus without complication (HCC)   . Hypertension     Patient Active Problem List   Diagnosis Date Noted  . Sepsis (HCC) 07/08/2018  . CAP (community acquired pneumonia) 07/08/2018  . Diabetes (HCC) 07/08/2018  . HTN (hypertension) 07/08/2018  . COPD with acute exacerbation (HCC) 07/08/2018  . Acute respiratory failure with hypoxia and hypercapnia (HCC) 12/10/2015    Past Surgical History:  Procedure Laterality Date  . ABDOMINAL HYSTERECTOMY    . COLONOSCOPY WITH PROPOFOL N/A 01/23/2017   Procedure: COLONOSCOPY WITH PROPOFOL;  Surgeon: Christena DeemSkulskie, Martin U, MD;  [facility];  Service: Endoscopy;  Laterality: N/A;  . FOOT SURGERY      Prior to Admission medications   Medication Sig Start Date End Date Taking? Authorizing Provider  acetaminophen (TYLENOL) 325 MG tablet Take 2 tablets (650 mg total) by mouth every 6 (six) hours as needed for mild pain (or Fever >/= 101). 12/13/15   Gouru, Deanna ArtisAruna, MD  amLODipine (NORVASC) 5 MG tablet Take 1 tablet (5 mg total) by mouth daily. 07/12/18   Delfino LovettShah, Vipul, MD  aspirin EC 81 MG tablet Take 81 mg by mouth daily.    [provider]  benzonatate (TESSALON) 100 MG capsule Take 100 mg by mouth 3 (three) times daily as needed for cough.    [provider]  budesonide-formoterol (SYMBICORT) 160-4.5 MCG/ACT inhaler Inhale 2 puffs into the lungs 2 (two) times daily.    [provider]  diclofenac (VOLTAREN) 75 MG EC tablet Take 75 mg  by mouth 2 (two) times daily as needed.    [provider]  doxycycline (VIBRA-TABS) 100 MG tablet Take 1 tablet (100 mg total) by mouth 2 (two) times daily. 05/12/19   Marcele Kosta, Charline Bills, PA-C  fluticasone (FLONASE) 50 MCG/ACT nasal spray Place 1 spray into both nostrils 2 (two) times daily. 07/07/18   [provider]  guaiFENesin-dextromethorphan (ROBITUSSIN DM) 100-10 MG/5ML syrup  Take 5 mLs by mouth every 4 (four) hours as needed for cough. 07/11/18   Max Sane, MD  ipratropium-albuterol (DUONEB) 0.5-2.5 (3) MG/3ML SOLN Take 3 mLs by nebulization every 4 (four) hours as needed. 02/14/17   Paulette Blanch, MD  levalbuterol [facility] HFA) 45 MCG/ACT inhaler Inhale 2 puffs into the lungs every 4 (four) hours as needed for wheezing.    [provider]  metFORMIN (GLUCOPHAGE-XR) 500 MG 24 hr tablet Take 500 mg by mouth daily with breakfast.    [provider]  predniSONE (STERAPRED UNI-PAK 21 TAB) 10 MG (21) TBPK tablet Start 60 mg once daily, taper 10 mg daily until done 07/11/18   Max Sane, MD  tiotropium (SPIRIVA HANDIHALER) 18 MCG inhalation capsule Place 1 capsule (18 mcg total) into inhaler and inhale daily. Patient taking differently: Place 18 mcg into inhaler and inhale daily as needed.  12/13/15   Nicholes Mango, MD    Allergies Penicillins  Family History  Problem Relation Age of Onset  . CAD Mother   . CAD Father   . Breast cancer Neg Hx     Social History Social History   Tobacco Use  . Smoking status: Former Smoker    Packs/day: 0.50    Years: 25.00    Pack years: 12.50    Types: Cigarettes    Quit date: 07/06/2014    Years since quitting: 4.8  . Smokeless tobacco: Never Used  Substance Use Topics  . Alcohol use: No    Alcohol/week: 0.0 standard drinks  . Drug use: No     Review of Systems  Constitutional: No fever/chills Eyes: No visual changes. No discharge ENT: Nasal congestion with sinus pressure Cardiovascular: no chest pain. Respiratory: no cough. No SOB. Gastrointestinal: No abdominal pain.  No nausea, no vomiting.  No diarrhea.  No constipation. Genitourinary: Negative for dysuria. No hematuria Musculoskeletal: Negative for musculoskeletal pain. Skin: Negative for rash, abrasions, lacerations, ecchymosis. Neurological: Positive for headache but denies focal weakness or numbness. 10-point ROS otherwise  negative.  ____________________________________________   PHYSICAL EXAM:  VITAL SIGNS: ED Triage Vitals  Enc Vitals Group     BP 05/12/19 1321 (!) 180/104     Pulse Rate 05/12/19 1321 81     Resp 05/12/19 1321 18     Temp 05/12/19 1321 98.2 F (36.8 C)     Temp Source 05/12/19 1321 Oral     SpO2 05/12/19 1321 97 %     Weight 05/12/19 1323 170 lb (77.1 kg)     Height 05/12/19 1323 5\' 3"  (1.6 m)     Head Circumference --      Peak Flow --      Pain Score 05/12/19 1320 5     Pain Loc --      Pain Edu? --      Excl. in [place]? --      Constitutional: Alert and oriented. Well appearing and in no acute distress. Eyes: Conjunctivae are normal. PERRL. EOMI. Head: Atraumatic. ENT:      Ears:       Nose: No  significant congestion/rhinnorhea.  Visualization of bilateral nares reveals erythema and edema along the left nares, mild boggy appearance of the right nares.  Patient is tender to percussion over the ethmoidal sinus and left maxillary sinus      Mouth/Throat: Mucous membranes are moist.  Neck: No stridor.  Neck is supple full range of motion Hematological/Lymphatic/Immunilogical: No cervical lymphadenopathy. Cardiovascular: Normal rate, regular rhythm. Normal S1 and S2.  Good peripheral circulation. Respiratory: Normal respiratory effort without tachypnea or retractions. Lungs CTAB. Good air entry to the bases with no decreased or absent breath sounds. Musculoskeletal: Full range of motion to all extremities. No gross deformities appreciated. Neurologic:  Normal speech and language. No gross focal neurologic deficits are appreciated.  Cranial nerves II through XII grossly intact.  Negative Romberg's and pronator drift. Skin:  Skin is warm, dry and intact. No rash noted. Psychiatric: Mood and affect are normal. Speech and behavior are normal. Patient exhibits appropriate insight and judgement.   ____________________________________________   LABS (all labs ordered are listed,  but only abnormal results are displayed)  Labs Reviewed - No data to display ____________________________________________  EKG   ____________________________________________  RADIOLOGY   No results found.  ____________________________________________    PROCEDURES  Procedure(s) performed:    Procedures    Medications  ketorolac (TORADOL) 30 MG/ML injection 15 mg (has no administration in time range)  diphenhydrAMINE (BENADRYL) injection 50 mg (has no administration in time range)  promethazine (PHENERGAN) injection 12.5 mg (has no administration in time range)  doxycycline (VIBRA-TABS) tablet 100 mg (has no administration in time range)     ____________________________________________   INITIAL IMPRESSION / ASSESSMENT AND PLAN / ED COURSE  Pertinent labs & imaging results that were available during my care of the patient were reviewed by me and considered in my medical decision making (see chart for details).  Review of the [place] CSRS was performed in accordance of the [facility] prior to dispensing any controlled drugs.         Patient's diagnosis is consistent with headache, bacterial sinusitis, hypertension.  Patient presented to the emergency department with complaints of left-sided frontal headache x4 days as well as rising blood pressure.    Patient states that she had been placed on omeprazole for anxiety and had not been taking her blood pressure medication because she was taking her "anxiety medication" of omeprazole.  Patient reports that her blood pressure has been rising every day until she had a reading of 200/100 at home.  Patient did take a dose of her losartan prior to arrival.    Patient did not have any symptoms concerning for CVA at this time.  Patient does have physical exam findings consistent with bacterial sinusitis on the left side which would correspond with patient's headache. Omeprazole does have side effects of headache, however I do feel that  this is more likely related to sinus infection and hypertension.  No evidence of endorgan damage to be concerned for hypertensive emergency.    At this time, I feel that symptoms of headache again are more likely associated with bacterial sinus versus hypertensive emergency or urgency.  Neuro exam is reassuring with no deficits.  No trauma.  Symptoms have been ongoing x4 days. No indication for labs or imaging at this time.    Patient will be given migraine cocktail for headache, started on doxycycline as she is allergic to penicillins for her sinus infection.  Patient is already on 2 "sinus medications" that was started 4 days ago  as well.  Continue these at home. I discussed that patient should continue her losartan for hypertension. Patient is to continue her normal losartan dose and I will not add any medications at this time as her blood pressure apparently is well controlled on losartan typically.   Follow-up with primary care as needed.  Return precautions discussed with the patient.  Patient is given ED precautions to return to the ED for any worsening or new symptoms.     ____________________________________________  FINAL CLINICAL IMPRESSION(S) / ED DIAGNOSES  Final diagnoses:  Bacterial sinusitis  Sinus headache  Essential hypertension      NEW MEDICATIONS STARTED DURING THIS VISIT:  ED Discharge Orders         Ordered    doxycycline (VIBRA-TABS) 100 MG tablet  2 times daily     05/12/19 1616              This chart was dictated using voice recognition software/Dragon. Despite best efforts to proofread, errors can occur which can change the meaning. Any change was purely unintentional.    Racheal PatchesCuthriell, Temeca Somma D, PA-C 05/12/19 1617    Arnaldo NatalMalinda, Paul F, MD 05/12/19 [phone]

## 2019-11-20 ENCOUNTER — Ambulatory Visit
Admission: RE | Admit: 2019-11-20 | Discharge: 2019-11-20 | Disposition: A | Discharge status: Home / Self Care | Payer: Medicare Other | Source: Ambulatory Visit | Attending: Family Medicine | Admitting: Family Medicine

## 2019-11-20 ENCOUNTER — Other Ambulatory Visit: Payer: Self-pay | Admitting: Family Medicine

## 2019-11-20 ENCOUNTER — Other Ambulatory Visit: Payer: Self-pay

## 2019-11-20 DIAGNOSIS — M25551 Pain in right hip: Secondary | ICD-10-CM

## 2019-11-20 DIAGNOSIS — M545 Low back pain, unspecified: Secondary | ICD-10-CM

## 2019-11-20 DIAGNOSIS — M25552 Pain in left hip: Secondary | ICD-10-CM

## 2020-02-11 ENCOUNTER — Ambulatory Visit
Admission: RE | Admit: 2020-02-11 | Discharge: 2020-02-11 | Disposition: A | Discharge status: Home / Self Care | Payer: Medicare Other | Source: Ambulatory Visit | Attending: Family Medicine | Admitting: Family Medicine

## 2020-02-11 ENCOUNTER — Other Ambulatory Visit: Payer: Self-pay | Admitting: Family Medicine

## 2020-02-11 DIAGNOSIS — M25562 Pain in left knee: Secondary | ICD-10-CM | POA: Diagnosis present

## 2020-02-11 DIAGNOSIS — M25561 Pain in right knee: Secondary | ICD-10-CM | POA: Insufficient documentation

## 2020-03-30 ENCOUNTER — Other Ambulatory Visit: Payer: Self-pay | Admitting: Specialist

## 2020-03-30 DIAGNOSIS — J449 Chronic obstructive pulmonary disease, unspecified: Secondary | ICD-10-CM

## 2020-03-30 DIAGNOSIS — Z9981 Dependence on supplemental oxygen: Secondary | ICD-10-CM

## 2020-03-30 DIAGNOSIS — R0609 Other forms of dyspnea: Secondary | ICD-10-CM

## 2020-12-21 ENCOUNTER — Other Ambulatory Visit: Payer: Self-pay | Admitting: Specialist

## 2020-12-21 DIAGNOSIS — Z9981 Dependence on supplemental oxygen: Secondary | ICD-10-CM

## 2020-12-21 DIAGNOSIS — R0600 Dyspnea, unspecified: Secondary | ICD-10-CM

## 2020-12-21 DIAGNOSIS — J439 Emphysema, unspecified: Secondary | ICD-10-CM

## 2020-12-21 DIAGNOSIS — R0609 Other forms of dyspnea: Secondary | ICD-10-CM

## 2021-01-17 ENCOUNTER — Other Ambulatory Visit: Payer: Self-pay

## 2021-01-17 DIAGNOSIS — W57XXXA Bitten or stung by nonvenomous insect and other nonvenomous arthropods, initial encounter: Secondary | ICD-10-CM | POA: Insufficient documentation

## 2021-01-17 DIAGNOSIS — E119 Type 2 diabetes mellitus without complications: Secondary | ICD-10-CM | POA: Insufficient documentation

## 2021-01-17 DIAGNOSIS — S70362A Insect bite (nonvenomous), left thigh, initial encounter: Secondary | ICD-10-CM | POA: Diagnosis not present

## 2021-01-17 DIAGNOSIS — Z7984 Long term (current) use of oral hypoglycemic drugs: Secondary | ICD-10-CM | POA: Insufficient documentation

## 2021-01-17 DIAGNOSIS — Z7982 Long term (current) use of aspirin: Secondary | ICD-10-CM | POA: Insufficient documentation

## 2021-01-17 DIAGNOSIS — I10 Essential (primary) hypertension: Secondary | ICD-10-CM | POA: Diagnosis not present

## 2021-01-17 DIAGNOSIS — Z79899 Other long term (current) drug therapy: Secondary | ICD-10-CM | POA: Diagnosis not present

## 2021-01-17 DIAGNOSIS — Z7951 Long term (current) use of inhaled steroids: Secondary | ICD-10-CM | POA: Insufficient documentation

## 2021-01-17 DIAGNOSIS — Z87891 Personal history of nicotine dependence: Secondary | ICD-10-CM | POA: Insufficient documentation

## 2021-01-17 DIAGNOSIS — J441 Chronic obstructive pulmonary disease with (acute) exacerbation: Secondary | ICD-10-CM | POA: Diagnosis not present

## 2021-01-17 NOTE — ED Triage Notes (Addendum)
Pt states she believes she might have tick or insect to left groin area. States feels "something moving" to area. Small red area to left upper thigh, nothing noted outside of skin, hard area palpable under skin.

## 2021-01-18 ENCOUNTER — Emergency Department
Admission: EM | Admit: 2021-01-18 | Discharge: 2021-01-18 | Disposition: A | Discharge status: Home / Self Care | Payer: Medicare Other | Attending: Emergency Medicine | Admitting: Emergency Medicine

## 2021-01-18 DIAGNOSIS — S70362A Insect bite (nonvenomous), left thigh, initial encounter: Secondary | ICD-10-CM

## 2021-01-18 DIAGNOSIS — W57XXXA Bitten or stung by nonvenomous insect and other nonvenomous arthropods, initial encounter: Secondary | ICD-10-CM

## 2021-01-18 MED ORDER — LIDOCAINE-EPINEPHRINE 2 %-1:100000 IJ SOLN
20.0000 mL | Freq: Once | INTRAMUSCULAR | Status: AC
  Administered 2021-01-18: 20 mL
  Filled 2021-01-18: qty 1

## 2021-01-18 NOTE — Discharge Instructions (Addendum)
Return to the ER for rash, headache, fever or other concerns.

## 2021-01-18 NOTE — ED Provider Notes (Signed)
[facility] Emergency Department Provider Note   ____________________________________________   Event Date/Time   First MD Initiated Contact with Patient 01/18/21 0026     (approximate)  I have reviewed the triage vital signs and the nursing notes.   HISTORY  Chief Complaint Insect Bite    HPI Katherine Carter is a 70 y.o. female who presents to the Emergency Department from home with a chief complaint of possible tick bite.  Patient states she might have a tick or insect bite to her left groin area.  States she felt something moving earlier.  Has not tried to remove it.  States the area feels itchy and burning.  Denies pain.  Voices no other complaints or injuries.     Past Medical History:  Diagnosis Date  . COPD (chronic obstructive pulmonary disease) (HCC)   . Diabetes mellitus without complication (HCC)   . Hypertension     Patient Active Problem List   Diagnosis Date Noted  . Sepsis (HCC) 07/08/2018  . CAP (community acquired pneumonia) 07/08/2018  . Diabetes (HCC) 07/08/2018  . HTN (hypertension) 07/08/2018  . COPD with acute exacerbation (HCC) 07/08/2018  . Acute respiratory failure with hypoxia and hypercapnia (HCC) 12/10/2015    Past Surgical History:  Procedure Laterality Date  . ABDOMINAL HYSTERECTOMY    . COLONOSCOPY WITH PROPOFOL N/A 01/23/2017   Procedure: COLONOSCOPY WITH PROPOFOL;  Surgeon: Christena Deem, MD;  Location: [facility] ENDOSCOPY;  Service: Endoscopy;  Laterality: N/A;  . FOOT SURGERY      Prior to Admission medications   Medication Sig Start Date End Date Taking? Authorizing Provider  acetaminophen (TYLENOL) 325 MG tablet Take 2 tablets (650 mg total) by mouth every 6 (six) hours as needed for mild pain (or Fever >/= 101). 12/13/15   Gouru, Deanna Artis, MD  amLODipine (NORVASC) 5 MG tablet Take 1 tablet (5 mg total) by mouth daily. 07/12/18   Delfino Lovett, MD  aspirin EC 81 MG tablet Take 81 mg by mouth daily.     [provider]  benzonatate (TESSALON) 100 MG capsule Take 100 mg by mouth 3 (three) times daily as needed for cough.    [provider]  budesonide-formoterol (SYMBICORT) 160-4.5 MCG/ACT inhaler Inhale 2 puffs into the lungs 2 (two) times daily.    [provider]  diclofenac (VOLTAREN) 75 MG EC tablet Take 75 mg by mouth 2 (two) times daily as needed.    [provider]  doxycycline (VIBRA-TABS) 100 MG tablet Take 1 tablet (100 mg total) by mouth 2 (two) times daily. 05/12/19   Cuthriell, Delorise Royals, PA-C  fluticasone (FLONASE) 50 MCG/ACT nasal spray Place 1 spray into both nostrils 2 (two) times daily. 07/07/18   [provider]  guaiFENesin-dextromethorphan (ROBITUSSIN DM) 100-10 MG/5ML syrup Take 5 mLs by mouth every 4 (four) hours as needed for cough. 07/11/18   Delfino Lovett, MD  ipratropium-albuterol (DUONEB) 0.5-2.5 (3) MG/3ML SOLN Take 3 mLs by nebulization every 4 (four) hours as needed. 02/14/17   Irean Hong, MD  levalbuterol [facility] HFA) 45 MCG/ACT inhaler Inhale 2 puffs into the lungs every 4 (four) hours as needed for wheezing.    [provider]  metFORMIN (GLUCOPHAGE-XR) 500 MG 24 hr tablet Take 500 mg by mouth daily with breakfast.    [provider]  predniSONE (STERAPRED UNI-PAK 21 TAB) 10 MG (21) TBPK tablet Start 60 mg once daily, taper 10 mg daily until done 07/11/18   Delfino Lovett, MD  tiotropium (SPIRIVA HANDIHALER) 18 MCG inhalation capsule Place 1 capsule (18 mcg total) into inhaler and inhale daily. Patient taking differently: Place 18 mcg into inhaler and inhale daily as needed.  12/13/15   Ramonita Lab, MD    Allergies Penicillins  Family History  Problem Relation Age of Onset  . CAD Mother   . CAD Father   . Breast cancer Neg Hx     Social History Social History   Tobacco Use  . Smoking status: Former Smoker    Packs/day: 0.50    Years: 25.00    Pack years: 12.50    Types: Cigarettes    Quit  date: 07/06/2014    Years since quitting: 6.5  . Smokeless tobacco: Never Used  Vaping Use  . Vaping Use: Never used  Substance Use Topics  . Alcohol use: No    Alcohol/week: 0.0 standard drinks  . Drug use: No    Review of Systems  Constitutional: No fever/chills Eyes: No visual changes. ENT: No sore throat. Cardiovascular: Denies chest pain. Respiratory: Denies shortness of breath. Gastrointestinal: No abdominal pain.  No nausea, no vomiting.  No diarrhea.  No constipation. Genitourinary: Negative for dysuria. Musculoskeletal: Negative for back pain. Skin: Positive for possible insect/tick bite.  Negative for rash. Neurological: Negative for headaches, focal weakness or numbness.   ____________________________________________   PHYSICAL EXAM:  VITAL SIGNS: ED Triage Vitals  Enc Vitals Group     BP 01/17/21 2327 (!) 175/91     Pulse Rate 01/17/21 2327 (!) 123     Resp 01/17/21 2327 20     Temp 01/17/21 2327 (!) 97.5 F (36.4 C)     Temp Source 01/17/21 2327 Oral     SpO2 01/17/21 2327 94 %     Weight 01/17/21 2328 174 lb (78.9 kg)     Height 01/17/21 2328 5\' 3"  (1.6 m)     Head Circumference --      Peak Flow --      Pain Score 01/17/21 2328 0     Pain Loc --      Pain Edu? --      Excl. in GC? --     Constitutional: Alert and oriented. Well appearing and in no acute distress. Eyes: Conjunctivae are normal. PERRL. EOMI. Head: Atraumatic. Nose: No congestion/rhinnorhea. Mouth/Throat: Mucous membranes are moist.   Neck: No stridor.   Cardiovascular: Normal rate, regular rhythm. Grossly normal heart sounds.  Good peripheral circulation. Respiratory: Normal respiratory effort.  No retractions. Lungs CTAB. Gastrointestinal: Soft and nontender. No distention. No abdominal bruits. No CVA tenderness. Musculoskeletal: No lower extremity tenderness nor edema.  No joint effusions. Neurologic:  Normal speech and language. No gross focal neurologic deficits are  appreciated. No gait instability. Skin:  Skin is warm, dry and intact. No rash noted. Left upper lateral thigh with small area of ?scab versus tick with underlying induration. Psychiatric: Mood and affect are normal. Speech and behavior are normal.  ____________________________________________   LABS (all labs ordered are listed, but only abnormal results are displayed)  Labs Reviewed - No data to display ____________________________________________  EKG  None ____________________________________________  RADIOLOGY I, Malique Driskill J, personally viewed and evaluated these images (plain radiographs) as part of my medical decision making, as well as reviewing the written report by the radiologist.  ED MD interpretation: None  Official radiology report(s): No results found.  ____________________________________________   PROCEDURES  Procedure(s) performed (including Critical Care):  Procedures  0115 injected 2% lidocaine with epinephrine to  area of tick bite ____________________________________________   INITIAL IMPRESSION / ASSESSMENT AND PLAN / ED COURSE  As part of my medical decision making, I reviewed the following data within the electronic MEDICAL RECORD NUMBER Nursing notes reviewed and incorporated, Old chart reviewed and Notes from prior ED visits     70 year old female presenting with possible insect/tick bite.  Unable to visualize clearly given deep nature of possible tick bite.  Will ask charge nurse Erie Noe to take a look.  Clinical Course as of 01/18/21 0205  Tue Jan 18, 2021  0104 Asked charge nurse Erie Noe to look at the area.  She was able to did get out body (which did not appear engorged) of the tick using sharp forceps.  Head remains buried.  Will inject lidocaine for comfort to remove the rest. [JS]  0129 Charge nurse Erie Noe able to remove head of tick after area was injected for comfort.  Area reassessed; no tick remains.  Patient provided chlorhexidine  swabs for wound care.  Strict return precautions given.  Patient verbalizes understanding agrees with plan of care. [JS]    Clinical Course User Index [JS] Irean Hong, MD     ____________________________________________   FINAL CLINICAL IMPRESSION(S) / ED DIAGNOSES  Final diagnoses:  Tick bite of left thigh, initial encounter     ED Discharge Orders    None      *Please note:  Britanni Yarde was evaluated in Emergency Department on 01/18/2021 for the symptoms described in the history of present illness. She was evaluated in the context of the global COVID-19 pandemic, which necessitated consideration that the patient might be at risk for infection with the SARS-CoV-2 virus that causes COVID-19. Institutional protocols and algorithms that pertain to the evaluation of patients at risk for COVID-19 are in a state of rapid change based on information released by regulatory bodies including the CDC and federal and state organizations. These policies and algorithms were followed during the patient's care in the ED.  Some ED evaluations and interventions may be delayed as a result of limited staffing during and the pandemic.*   Note:  This document was prepared using Dragon voice recognition software and may include unintentional dictation errors.   Irean Hong, MD 01/18/21 [phone]

## 2021-02-13 ENCOUNTER — Other Ambulatory Visit: Payer: Self-pay

## 2021-02-13 ENCOUNTER — Encounter: Payer: Self-pay | Admitting: Emergency Medicine

## 2021-02-13 ENCOUNTER — Ambulatory Visit
Admission: EM | Admit: 2021-02-13 | Discharge: 2021-02-13 | Disposition: A | Discharge status: Home / Self Care | Payer: Medicare Other | Attending: Physician Assistant | Admitting: Physician Assistant

## 2021-02-13 DIAGNOSIS — Z87891 Personal history of nicotine dependence: Secondary | ICD-10-CM | POA: Diagnosis not present

## 2021-02-13 DIAGNOSIS — Z20822 Contact with and (suspected) exposure to covid-19: Secondary | ICD-10-CM | POA: Diagnosis not present

## 2021-02-13 DIAGNOSIS — X58XXXA Exposure to other specified factors, initial encounter: Secondary | ICD-10-CM | POA: Diagnosis not present

## 2021-02-13 DIAGNOSIS — Z79899 Other long term (current) drug therapy: Secondary | ICD-10-CM | POA: Insufficient documentation

## 2021-02-13 DIAGNOSIS — R220 Localized swelling, mass and lump, head: Secondary | ICD-10-CM | POA: Diagnosis present

## 2021-02-13 DIAGNOSIS — Z7982 Long term (current) use of aspirin: Secondary | ICD-10-CM | POA: Insufficient documentation

## 2021-02-13 DIAGNOSIS — R519 Headache, unspecified: Secondary | ICD-10-CM | POA: Diagnosis not present

## 2021-02-13 DIAGNOSIS — Z791 Long term (current) use of non-steroidal anti-inflammatories (NSAID): Secondary | ICD-10-CM | POA: Insufficient documentation

## 2021-02-13 DIAGNOSIS — T783XXA Angioneurotic edema, initial encounter: Secondary | ICD-10-CM | POA: Diagnosis not present

## 2021-02-13 DIAGNOSIS — Z7951 Long term (current) use of inhaled steroids: Secondary | ICD-10-CM | POA: Insufficient documentation

## 2021-02-13 DIAGNOSIS — J449 Chronic obstructive pulmonary disease, unspecified: Secondary | ICD-10-CM | POA: Diagnosis not present

## 2021-02-13 DIAGNOSIS — R059 Cough, unspecified: Secondary | ICD-10-CM | POA: Diagnosis not present

## 2021-02-13 MED ORDER — FAMOTIDINE 20 MG PO TABS: 20.0000 mg | ORAL_TABLET | Freq: Two times a day (BID) | ORAL | 0 refills | Status: DC

## 2021-02-13 MED ORDER — DIPHENHYDRAMINE HCL 50 MG/ML IJ SOLN
50.0000 mg | Freq: Once | INTRAMUSCULAR | Status: AC
Start: 2021-02-13 — End: 2021-02-13
  Administered 2021-02-13: 50 mg via INTRAMUSCULAR

## 2021-02-13 MED ORDER — METHYLPREDNISOLONE SODIUM SUCC 125 MG IJ SOLR
125.0000 mg | Freq: Once | INTRAMUSCULAR | Status: AC
  Administered 2021-02-13: 125 mg via INTRAMUSCULAR

## 2021-02-13 MED ORDER — DIPHENHYDRAMINE HCL 25 MG PO TABS: ORAL_TABLET | ORAL | 0 refills | Status: DC

## 2021-02-13 MED ORDER — PREDNISONE 10 MG PO TABS: ORAL_TABLET | ORAL | 0 refills | Status: DC

## 2021-02-13 MED ORDER — EPINEPHRINE 0.3 MG/0.3ML IJ SOAJ: 0.3000 mg | INTRAMUSCULAR | 0 refills | Status: DC | PRN

## 2021-02-13 NOTE — ED Provider Notes (Signed)
[facility]    CSN: [id number] Arrival date & time: 02/13/21  1339      History   Chief Complaint Chief Complaint  Patient presents with   Oral Swelling    HPI Katherine Carter is a 70 y.o. female presenting for onset of lower lip swelling about 5 to 6 hours ago.  Patient says that earlier this morning she started to develop a cough so she took some cough syrup.  She believes the lip swelling started after that.  She says that the swelling has not gotten any worse in the past 2 to 3 hours.  She says her ears feel full.  Denies any throat tightness/swelling, difficulty swallowing, chest tightness or shortness of breath.  Patient does have history of COPD and has occasional wheezing.  She denies any fevers or fatigue or body aches.  No congestion or sore throat.  She states that it felt a little scratchy this morning when she woke up.  She denies any nausea or vomiting.  Patient denies any sick contacts and no known exposure to COVID-19.  She has not taken any medication to help the lip swelling or applied ice.  Patient does say that she has had a similar issue in the past but is not sure what it was in response to.  She says she believes it might have been due to a cough syrup but she is not positive.  Patient has a history of hypertension as well and takes losartan and amlodipine.  States she has been taking losartan for years.  The only new medication she started has been Breo about 2 to 3 months ago.  No other complaints.  HPI  Past Medical History:  Diagnosis Date   COPD (chronic obstructive pulmonary disease) (HCC)    Diabetes mellitus without complication (HCC)    Hypertension     Patient Active Problem List   Diagnosis Date Noted   Sepsis (HCC) 07/08/2018   CAP (community acquired pneumonia) 07/08/2018   Diabetes (HCC) 07/08/2018   HTN (hypertension) 07/08/2018   COPD with acute exacerbation (HCC) 07/08/2018   Acute respiratory failure with hypoxia and  hypercapnia (HCC) 12/10/2015    Past Surgical History:  Procedure Laterality Date   ABDOMINAL HYSTERECTOMY     COLONOSCOPY WITH PROPOFOL N/A 01/23/2017   Procedure: COLONOSCOPY WITH PROPOFOL;  Surgeon: Christena Deem, MD;  Location: [facility] ENDOSCOPY;  Service: Endoscopy;  Laterality: N/A;   FOOT SURGERY      OB History   No obstetric history on file.      Home Medications    Prior to Admission medications   Medication Sig Start Date End Date Taking? Authorizing Provider  amLODipine (NORVASC) 5 MG tablet Take 1 tablet (5 mg total) by mouth daily. 07/12/18  Yes Delfino Lovett, MD  aspirin EC 81 MG tablet Take 81 mg by mouth daily.   Yes [provider]  BREO ELLIPTA 100-25 MCG/INH AEPB Inhale 1 puff into the lungs daily. 12/20/20  Yes [provider]  budesonide-formoterol (SYMBICORT) 160-4.5 MCG/ACT inhaler Inhale 2 puffs into the lungs 2 (two) times daily.   Yes [provider]  diclofenac (VOLTAREN) 75 MG EC tablet Take 75 mg by mouth 2 (two) times daily as needed.   Yes [provider]  diphenhydrAMINE (BENADRYL) 25 MG tablet Take 1 to 2 tablets every 4-6 hours as needed for swelling/allergy 02/13/21  Yes Eusebio Friendly B, PA-C  EPINEPHrine 0.3 mg/0.3 mL IJ SOAJ injection Inject 0.3 mg  into the muscle as needed for anaphylaxis (swelling of lips, tongue, throat, swallowing issue , breathing problem. Use and then call EMS). 02/13/21 02/13/22 Yes Shirlee Latch, PA-C  famotidine (PEPCID) 20 MG tablet Take 1 tablet (20 mg total) by mouth 2 (two) times daily for 5 days. 02/13/21 02/18/21 Yes Eusebio Friendly B, PA-C  fluticasone (FLONASE) 50 MCG/ACT nasal spray Place 1 spray into both nostrils 2 (two) times daily. 07/07/18  Yes [provider]  ipratropium-albuterol (DUONEB) 0.5-2.5 (3) MG/3ML SOLN Take 3 mLs by nebulization every 4 (four) hours as needed. 02/14/17  Yes Irean Hong, MD  latanoprost (XALATAN) 0.005 % ophthalmic solution latanoprost 0.005 %  eye drops   Yes [provider]  levalbuterol (XOPENEX HFA) 45 MCG/ACT inhaler Inhale 2 puffs into the lungs every 4 (four) hours as needed for wheezing.   Yes [provider]  losartan (COZAAR) 100 MG tablet losartan 100 mg tablet 04/16/20  Yes [provider]  metFORMIN (GLUCOPHAGE-XR) 500 MG 24 hr tablet Take 500 mg by mouth daily with breakfast.   Yes [provider]  predniSONE (DELTASONE) 10 MG tablet Take 6 tablets p.o. on the first day and decrease by 1 tablet daily until complete 02/13/21  Yes Eusebio Friendly B, PA-C  tiotropium (SPIRIVA HANDIHALER) 18 MCG inhalation capsule Place 1 capsule (18 mcg total) into inhaler and inhale daily. Patient taking differently: Place 18 mcg into inhaler and inhale daily as needed. 12/13/15  Yes Gouru, Deanna Artis, MD  acetaminophen (TYLENOL) 325 MG tablet Take 2 tablets (650 mg total) by mouth every 6 (six) hours as needed for mild pain (or Fever >/= 101). 12/13/15   Gouru, Deanna Artis, MD  benzonatate (TESSALON) 100 MG capsule Take 100 mg by mouth 3 (three) times daily as needed for cough.    [provider]    Family History Family History  Problem Relation Age of Onset   CAD Mother    CAD Father    Breast cancer Neg Hx     Social History Social History   Tobacco Use   Smoking status: Former    Packs/day: 0.50    Years: 25.00    Pack years: 12.50    Types: Cigarettes    Quit date: 07/06/2014    Years since quitting: 6.6   Smokeless tobacco: Never  Vaping Use   Vaping Use: Never used  Substance Use Topics   Alcohol use: No    Alcohol/week: 0.0 standard drinks   Drug use: No     Allergies   Penicillins   Review of Systems Review of Systems  Constitutional:  Negative for chills, diaphoresis, fatigue and fever.  HENT:  Positive for facial swelling. Negative for congestion, ear pain, rhinorrhea, sinus pressure, sinus pain and sore throat.   Respiratory:  Positive for cough. Negative for shortness of  breath.   Cardiovascular:  Negative for chest pain and palpitations.  Gastrointestinal:  Negative for abdominal pain, nausea and vomiting.  Musculoskeletal:  Negative for arthralgias and myalgias.  Skin:  Negative for rash.  Neurological:  Negative for dizziness, weakness and headaches.  Hematological:  Negative for adenopathy.    Physical Exam Triage Vital Signs ED Triage Vitals  Enc Vitals Group     BP 02/13/21 1354 122/73     Pulse Rate 02/13/21 1354 94     Resp 02/13/21 1354 16     Temp 02/13/21 1354 98.3 F (36.8 C)     Temp Source 02/13/21 1354 Oral  SpO2 02/13/21 1354 95 %     Weight 02/13/21 1349 170 lb (77.1 kg)     Height 02/13/21 1349 5\' 3"  (1.6 m)     Head Circumference --      Peak Flow --      Pain Score 02/13/21 1349 0     Pain Loc --      Pain Edu? --      Excl. in GC? --    No data found.  Updated Vital Signs BP 122/73 (BP Location: Left Arm)   Pulse 94   Temp 98.3 F (36.8 C) (Oral)   Resp 16   Ht 5\' 3"  (1.6 m)   Wt 170 lb (77.1 kg)   SpO2 95%   BMI 30.11 kg/m       Physical Exam Vitals and nursing note reviewed.  Constitutional:      General: She is not in acute distress.    Appearance: Normal appearance. She is not ill-appearing or toxic-appearing.  HENT:     Head: Normocephalic and atraumatic.     Right Ear: Tympanic membrane, ear canal and external ear normal.     Left Ear: Tympanic membrane, ear canal and external ear normal.     Nose: Nose normal.     Mouth/Throat:     Mouth: Mucous membranes are moist. Angioedema present.     Pharynx: Oropharynx is clear.      Comments: ~60% of lower lip (as indicated in photo) is moderately swollen. No intraoral swelling.   There is a white coating on tongue. Patient says this is from her medication and not fungal. Eyes:     General: No scleral icterus.       Right eye: No discharge.        Left eye: No discharge.     Conjunctiva/sclera: Conjunctivae normal.  Cardiovascular:     Rate and  Rhythm: Normal rate and regular rhythm.     Heart sounds: Normal heart sounds.  Pulmonary:     Effort: Pulmonary effort is normal. No respiratory distress.     Breath sounds: Wheezing (few scattered wheezes throughout) present.  Musculoskeletal:     Cervical back: Neck supple.  Skin:    General: Skin is dry.  Neurological:     General: No focal deficit present.     Mental Status: She is alert. Mental status is at baseline.     Motor: No weakness.     Gait: Gait normal.  Psychiatric:        Mood and Affect: Mood normal.        Behavior: Behavior normal.        Thought Content: Thought content normal.     UC Treatments / Results  Labs (all labs ordered are listed, but only abnormal results are displayed) Labs Reviewed  SARS CORONAVIRUS 2 (TAT 6-24 HRS)    EKG   Radiology No results found.  Procedures Procedures (including critical care time)  Medications Ordered in UC Medications  diphenhydrAMINE (BENADRYL) injection 50 mg (50 mg Intramuscular Given 02/13/21 1448)  methylPREDNISolone sodium succinate (SOLU-MEDROL) 125 mg/2 mL injection 125 mg (125 mg Intramuscular Given 02/13/21 1448)    Initial Impression / Assessment and Plan / UC Course  I have reviewed the triage vital signs and the nursing notes.  Pertinent labs & imaging results that were available during my care of the patient were reviewed by me and considered in my medical decision making (see chart for details).  70 year old female presenting for  lip swelling started 5 to 6 hours prior to arrival to urgent care.  Exam does reveal approximately 60% swelling of the left side of her lower lip.  Patient says that she took cough medicine she believes before this started.  Patient also is taking losartan.  Admits to a similar problem the past but is unsure of the cause.  All vital signs are normal and stable and she is denying any red flag signs or symptoms such as tongue swelling, throat tightness or difficulty  swallowing or breathing problem.  Patient has not taken any antihistamines prior to arrival.  Patient also complaining of a cough that started this morning.  She does have COPD.  No COVID exposure.  We are testing her for COVID-19.  Current CDC guidelines, isolation protocol and ED precautions reviewed with patient.  She is vaccinated COVID-19 x2.  She thinks she might of had a booster but she is unsure.  Patient given 50 mg IM Benadryl in clinic and 125 IM Solu-Medrol in clinic.  Patient monitored for 2 hours and no worsening of condition.  Mild improvement in the swelling.  Advised her not to take the cough syrup anymore and to hold her losartan as long as she is having swelling.  Unsure as to which of these has caused her condition.  Advised her starting steroids tomorrow and continuing the antihistamines today.  Advised her to call her PCP first thing tomorrow.  I did thoroughly caution her on ED precautions and also sent a prescription for an EpiPen and advised her how to use this just in case any of her swelling worsens or other signs of anaphylaxis.  Patient feels confident she can go home and care for herself.  Leaving in stable condition.   Final Clinical Impressions(s) / UC Diagnoses   Final diagnoses:  Angioedema, initial encounter  Lip swelling  Cough  Acute nonintractable headache, unspecified headache type     Discharge Instructions      LIP SWELLING: I am unsure what has caused your lip swelling.  It could be the cough syrup that you took so do not take that again.  It could also possibly be the losartan that you are taking.  Do not take the losartan again until the symptoms are resolved and your PCP said it is okay to do so.  Call your PCP tomorrow and let them know you were seen in urgent care for this.  At this time you have been given 50 mg of Benadryl and 125 mg of Solu-Medrol which is a corticosteroid.  I would like you to continue the antihistamines today as prescribed and  start the prednisone tomorrow.  You should apply ice to your lip.  You can take Tylenol for your headache when you get home.  We have tested you for COVID because of your cough.  See more information about that below.  Someone will call you if it is positive in the next 24 to 36 hours.  I have prescribed an EpiPen for you.  You should use this if you have increase in swelling i.e. the swelling spreads to your top lip or affects your tongue or throat or you have difficulty swallowing, chest tightness or breathing problem.  After you give this or someone get that to you then EMS needs to be called immediately.  If you feel that your symptoms are worsening but not warranting use of this medication then you should go to the emergency department.   You have received  COVID testing today either for positive exposure, concerning symptoms that could be related to COVID infection, screening purposes, or re-testing after confirmed positive.  Your test obtained today checks for active viral infection in the last 1-2 weeks. If your test is negative now, you can still test positive later. So, if you do develop symptoms you should either get re-tested and/or isolate x 5 days and then strict mask use x 5 days (unvaccinated) or mask use x 10 days (vaccinated). Please follow CDC guidelines.  While Rapid antigen tests come back in 15-20 minutes, send out PCR/molecular test results typically come back within 1-3 days. In the mean time, if you are symptomatic, assume this could be a positive test and treat/monitor yourself as if you do have COVID.   We will call with test results if positive. Please download the MyChart app and set up a profile to access test results.   If symptomatic, go home and rest. Push fluids. Take Tylenol as needed for discomfort. Gargle warm salt water. Throat lozenges. Take Mucinex DM or Robitussin for cough. Humidifier in bedroom to ease coughing. Warm showers. Also review the COVID handout for more  information.  COVID-19 INFECTION: The incubation period of COVID-19 is approximately 14 days after exposure, with most symptoms developing in roughly 4-5 days. Symptoms may range in severity from mild to critically severe. Roughly 80% of those infected will have mild symptoms. People of any age may become infected with COVID-19 and have the ability to transmit the virus. The most common symptoms include: fever, fatigue, cough, body aches, headaches, sore throat, nasal congestion, shortness of breath, nausea, vomiting, diarrhea, changes in smell and/or taste.    COURSE OF ILLNESS Some patients may begin with mild disease which can progress quickly into critical symptoms. If your symptoms are worsening please call ahead to the Emergency Department and proceed there for further treatment. Recovery time appears to be roughly 1-2 weeks for mild symptoms and 3-6 weeks for severe disease.   GO IMMEDIATELY TO ER FOR FEVER YOU ARE UNABLE TO GET DOWN WITH TYLENOL, BREATHING PROBLEMS, CHEST PAIN, FATIGUE, LETHARGY, INABILITY TO EAT OR DRINK, ETC  QUARANTINE AND ISOLATION: To help decrease the spread of COVID-19 please remain isolated if you have COVID infection or are highly suspected to have COVID infection. This means -stay home and isolate to one room in the home if you live with others. Do not share a bed or bathroom with others while ill, sanitize and wipe down all countertops and keep common areas clean and disinfected. Stay home for 5 days. If you have no symptoms or your symptoms are resolving after 5 days, you can leave your house. Continue to wear a mask around others for 5 additional days. If you have been in close contact (within 6 feet) of someone diagnosed with COVID 19, you are advised to quarantine in your home for 14 days as symptoms can develop anywhere from 2-14 days after exposure to the virus. If you develop symptoms, you  must isolate.  Most current guidelines for COVID after  exposure -unvaccinated: isolate 5 days and strict mask use x 5 days. Test on day 5 is possible -vaccinated: wear mask x 10 days if symptoms do not develop -You do not necessarily need to be tested for COVID if you have + exposure and  develop symptoms. Just isolate at home x10 days from symptom onset During this global pandemic, CDC advises to practice social distancing, try to stay at least 4ft away  from others at all times. Wear a face covering. Wash and sanitize your hands regularly and avoid going anywhere that is not necessary.  KEEP IN MIND THAT THE COVID TEST IS NOT 100% ACCURATE AND YOU SHOULD STILL DO EVERYTHING TO PREVENT POTENTIAL SPREAD OF VIRUS TO OTHERS (WEAR MASK, WEAR GLOVES, WASH HANDS AND SANITIZE REGULARLY). IF INITIAL TEST IS NEGATIVE, THIS MAY NOT MEAN YOU ARE DEFINITELY NEGATIVE. MOST ACCURATE TESTING IS DONE 5-7 DAYS AFTER EXPOSURE.   It is not advised by CDC to get re-tested after receiving a positive COVID test since you can still test positive for weeks to months after you have already cleared the virus.   *If you have not been vaccinated for COVID, I strongly suggest you consider getting vaccinated as long as there are no contraindications.       ED Prescriptions     Medication Sig Dispense Auth. Provider   diphenhydrAMINE (BENADRYL) 25 MG tablet Take 1 to 2 tablets every 4-6 hours as needed for swelling/allergy 50 tablet Eusebio Friendly B, PA-C   famotidine (PEPCID) 20 MG tablet Take 1 tablet (20 mg total) by mouth 2 (two) times daily for 5 days. 10 tablet Eusebio Friendly B, PA-C   predniSONE (DELTASONE) 10 MG tablet Take 6 tablets p.o. on the first day and decrease by 1 tablet daily until complete 21 tablet Eusebio Friendly B, PA-C   EPINEPHrine 0.3 mg/0.3 mL IJ SOAJ injection Inject 0.3 mg into the muscle as needed for anaphylaxis (swelling of lips, tongue, throat, swallowing issue , breathing problem. Use and then call EMS). 1 each Gareth Morgan      PDMP not  reviewed this encounter.   Shirlee Latch, PA-C 02/13/21 1550

## 2021-02-13 NOTE — Discharge Instructions (Addendum)
LIP SWELLING: I am unsure what has caused your lip swelling.  It could be the cough syrup that you took so do not take that again.  It could also possibly be the losartan that you are taking.  Do not take the losartan again until the symptoms are resolved and your PCP said it is okay to do so.  Call your PCP tomorrow and let them know you were seen in urgent care for this.  At this time you have been given 50 mg of Benadryl and 125 mg of Solu-Medrol which is a corticosteroid.  I would like you to continue the antihistamines today as prescribed and start the prednisone tomorrow.  You should apply ice to your lip.  You can take Tylenol for your headache when you get home.  We have tested you for COVID because of your cough.  See more information about that below.  Someone will call you if it is positive in the next 24 to 36 hours.  I have prescribed an EpiPen for you.  You should use this if you have increase in swelling i.e. the swelling spreads to your top lip or affects your tongue or throat or you have difficulty swallowing, chest tightness or breathing problem.  After you give this or someone get that to you then EMS needs to be called immediately.  If you feel that your symptoms are worsening but not warranting use of this medication then you should go to the emergency department.   You have received COVID testing today either for positive exposure, concerning symptoms that could be related to COVID infection, screening purposes, or re-testing after confirmed positive.  Your test obtained today checks for active viral infection in the last 1-2 weeks. If your test is negative now, you can still test positive later. So, if you do develop symptoms you should either get re-tested and/or isolate x 5 days and then strict mask use x 5 days (unvaccinated) or mask use x 10 days (vaccinated). Please follow CDC guidelines.  While Rapid antigen tests come back in 15-20 minutes, send out PCR/molecular test results  typically come back within 1-3 days. In the mean time, if you are symptomatic, assume this could be a positive test and treat/monitor yourself as if you do have COVID.   We will call with test results if positive. Please download the MyChart app and set up a profile to access test results.   If symptomatic, go home and rest. Push fluids. Take Tylenol as needed for discomfort. Gargle warm salt water. Throat lozenges. Take Mucinex DM or Robitussin for cough. Humidifier in bedroom to ease coughing. Warm showers. Also review the COVID handout for more information.  COVID-19 INFECTION: The incubation period of COVID-19 is approximately 14 days after exposure, with most symptoms developing in roughly 4-5 days. Symptoms may range in severity from mild to critically severe. Roughly 80% of those infected will have mild symptoms. People of any age may become infected with COVID-19 and have the ability to transmit the virus. The most common symptoms include: fever, fatigue, cough, body aches, headaches, sore throat, nasal congestion, shortness of breath, nausea, vomiting, diarrhea, changes in smell and/or taste.    COURSE OF ILLNESS Some patients may begin with mild disease which can progress quickly into critical symptoms. If your symptoms are worsening please call ahead to the Emergency Department and proceed there for further treatment. Recovery time appears to be roughly 1-2 weeks for mild symptoms and 3-6 weeks for severe disease.  GO IMMEDIATELY TO ER FOR FEVER YOU ARE UNABLE TO GET DOWN WITH TYLENOL, BREATHING PROBLEMS, CHEST PAIN, FATIGUE, LETHARGY, INABILITY TO EAT OR DRINK, ETC  QUARANTINE AND ISOLATION: To help decrease the spread of COVID-19 please remain isolated if you have COVID infection or are highly suspected to have COVID infection. This means -stay home and isolate to one room in the home if you live with others. Do not share a bed or bathroom with others while ill, sanitize and wipe down all  countertops and keep common areas clean and disinfected. Stay home for 5 days. If you have no symptoms or your symptoms are resolving after 5 days, you can leave your house. Continue to wear a mask around others for 5 additional days. If you have been in close contact (within 6 feet) of someone diagnosed with COVID 19, you are advised to quarantine in your home for 14 days as symptoms can develop anywhere from 2-14 days after exposure to the virus. If you develop symptoms, you  must isolate.  Most current guidelines for COVID after exposure -unvaccinated: isolate 5 days and strict mask use x 5 days. Test on day 5 is possible -vaccinated: wear mask x 10 days if symptoms do not develop -You do not necessarily need to be tested for COVID if you have + exposure and  develop symptoms. Just isolate at home x10 days from symptom onset During this global pandemic, CDC advises to practice social distancing, try to stay at least 63ft away from others at all times. Wear a face covering. Wash and sanitize your hands regularly and avoid going anywhere that is not necessary.  KEEP IN MIND THAT THE COVID TEST IS NOT 100% ACCURATE AND YOU SHOULD STILL DO EVERYTHING TO PREVENT POTENTIAL SPREAD OF VIRUS TO OTHERS (WEAR MASK, WEAR GLOVES, WASH HANDS AND SANITIZE REGULARLY). IF INITIAL TEST IS NEGATIVE, THIS MAY NOT MEAN YOU ARE DEFINITELY NEGATIVE. MOST ACCURATE TESTING IS DONE 5-7 DAYS AFTER EXPOSURE.   It is not advised by CDC to get re-tested after receiving a positive COVID test since you can still test positive for weeks to months after you have already cleared the virus.   *If you have not been vaccinated for COVID, I strongly suggest you consider getting vaccinated as long as there are no contraindications.

## 2021-02-13 NOTE — ED Triage Notes (Signed)
Patient c/o swelling in her lower lip that started this morning. Patient denies SOB or difficulty breathing.

## 2021-02-14 LAB — SARS CORONAVIRUS 2 (TAT 6-24 HRS): SARS Coronavirus 2: NEGATIVE

## 2021-05-25 ENCOUNTER — Ambulatory Visit
Admission: EM | Admit: 2021-05-25 | Discharge: 2021-05-25 | Disposition: A | Discharge status: Home / Self Care | Payer: Medicare Other | Attending: Family Medicine | Admitting: Family Medicine

## 2021-05-25 ENCOUNTER — Other Ambulatory Visit: Payer: Self-pay

## 2021-05-25 DIAGNOSIS — Z20822 Contact with and (suspected) exposure to covid-19: Secondary | ICD-10-CM | POA: Diagnosis not present

## 2021-05-25 DIAGNOSIS — J441 Chronic obstructive pulmonary disease with (acute) exacerbation: Secondary | ICD-10-CM | POA: Insufficient documentation

## 2021-05-25 DIAGNOSIS — Z7984 Long term (current) use of oral hypoglycemic drugs: Secondary | ICD-10-CM | POA: Insufficient documentation

## 2021-05-25 DIAGNOSIS — Z87891 Personal history of nicotine dependence: Secondary | ICD-10-CM | POA: Insufficient documentation

## 2021-05-25 DIAGNOSIS — R059 Cough, unspecified: Secondary | ICD-10-CM | POA: Diagnosis not present

## 2021-05-25 DIAGNOSIS — R0981 Nasal congestion: Secondary | ICD-10-CM | POA: Insufficient documentation

## 2021-05-25 DIAGNOSIS — Z79899 Other long term (current) drug therapy: Secondary | ICD-10-CM | POA: Insufficient documentation

## 2021-05-25 DIAGNOSIS — I10 Essential (primary) hypertension: Secondary | ICD-10-CM | POA: Insufficient documentation

## 2021-05-25 DIAGNOSIS — E119 Type 2 diabetes mellitus without complications: Secondary | ICD-10-CM | POA: Diagnosis not present

## 2021-05-25 DIAGNOSIS — Z7951 Long term (current) use of inhaled steroids: Secondary | ICD-10-CM | POA: Insufficient documentation

## 2021-05-25 DIAGNOSIS — Z7901 Long term (current) use of anticoagulants: Secondary | ICD-10-CM | POA: Diagnosis not present

## 2021-05-25 DIAGNOSIS — Z7982 Long term (current) use of aspirin: Secondary | ICD-10-CM | POA: Diagnosis not present

## 2021-05-25 MED ORDER — PSEUDOEPH-BROMPHEN-DM 30-2-10 MG/5ML PO SYRP: 5.0000 mL | ORAL_SOLUTION | Freq: Four times a day (QID) | ORAL | 0 refills | Status: AC | PRN

## 2021-05-25 MED ORDER — AZITHROMYCIN 250 MG PO TABS: 250.0000 mg | ORAL_TABLET | Freq: Every day | ORAL | 0 refills | Status: DC

## 2021-05-25 MED ORDER — PREDNISONE 20 MG PO TABS: 40.0000 mg | ORAL_TABLET | Freq: Every day | ORAL | 0 refills | Status: AC

## 2021-05-25 NOTE — ED Provider Notes (Signed)
[facility]    CSN: [id number] Arrival date & time: 05/25/21  1232      History   Chief Complaint Chief Complaint  Patient presents with   Cough   Nasal Congestion    HPI Katherine Carter is a 70 y.o. female with history of COPD, diabetes and hypertension.  Patient is presenting today for 2-day history of fatigue, cough, nasal congestion, bilateral ear fullness, headaches and mild increase shortness of breath.  Patient says that her sputum production is clear to light yellow.  She has been using her at home inhalers and has used her home oxygen as well.  Oxygen is currently 100% without any supplemental oxygen.  Patient says her husband has been ill with similar symptoms.  She denies any known COVID exposure.  She is vaccinated x3 for COVID-19.  No other complaints.  HPI  Past Medical History:  Diagnosis Date   COPD (chronic obstructive pulmonary disease) (HCC)    Diabetes mellitus without complication (HCC)    Hypertension     Patient Active Problem List   Diagnosis Date Noted   Sepsis (HCC) 07/08/2018   CAP (community acquired pneumonia) 07/08/2018   Diabetes (HCC) 07/08/2018   HTN (hypertension) 07/08/2018   COPD with acute exacerbation (HCC) 07/08/2018   Acute respiratory failure with hypoxia and hypercapnia (HCC) 12/10/2015    Past Surgical History:  Procedure Laterality Date   ABDOMINAL HYSTERECTOMY     COLONOSCOPY WITH PROPOFOL N/A 01/23/2017   Procedure: COLONOSCOPY WITH PROPOFOL;  Surgeon: Christena Deem, MD;  Location: [facility];  Service: Endoscopy;  Laterality: N/A;   FOOT SURGERY      OB History   No obstetric history on file.      Home Medications    Prior to Admission medications   Medication Sig Start Date End Date Taking? Authorizing Provider  amLODipine (NORVASC) 5 MG tablet Take 1 tablet (5 mg total) by mouth daily. 07/12/18  Yes Delfino Lovett, MD  aspirin EC 81 MG tablet Take 81 mg by mouth daily.   Yes [provider]  azithromycin (ZITHROMAX) 250 MG tablet Take 1 tablet (250 mg total) by mouth daily. Take first 2 tablets together, then 1 every day until finished. 05/25/21  Yes Shirlee Latch, PA-C  benzonatate (TESSALON) 100 MG capsule Take 100 mg by mouth 3 (three) times daily as needed for cough.   Yes [provider]  BREO ELLIPTA 100-25 MCG/INH AEPB Inhale 1 puff into the lungs daily. 12/20/20  Yes [provider]  brompheniramine-pseudoephedrine-DM 30-2-10 MG/5ML syrup Take 5 mLs by mouth 4 (four) times daily as needed for up to 7 days. 05/25/21 06/01/21 Yes Shirlee Latch, PA-C  budesonide-formoterol (SYMBICORT) 160-4.5 MCG/ACT inhaler Inhale 2 puffs into the lungs 2 (two) times daily.   Yes [provider]  diclofenac (VOLTAREN) 75 MG EC tablet Take 75 mg by mouth 2 (two) times daily as needed.   Yes [provider]  fluticasone (FLONASE) 50 MCG/ACT nasal spray Place 1 spray into both nostrils 2 (two) times daily. 07/07/18  Yes [provider]  hydrochlorothiazide (MICROZIDE) 12.5 MG capsule Take 12.5 mg by mouth daily. 03/29/21  Yes [provider]  ipratropium-albuterol (DUONEB) 0.5-2.5 (3) MG/3ML SOLN Take 3 mLs by nebulization every 4 (four) hours as needed. 02/14/17  Yes Irean Hong, MD  latanoprost (XALATAN) 0.005 % ophthalmic solution latanoprost 0.005 % eye drops   Yes [provider]  levalbuterol (XOPENEX HFA) 45 MCG/ACT inhaler  Inhale 2 puffs into the lungs every 4 (four) hours as needed for wheezing.   Yes [provider]  losartan (COZAAR) 100 MG tablet losartan 100 mg tablet 04/16/20  Yes [provider]  meloxicam (MOBIC) 15 MG tablet Take 15 mg by mouth daily. 04/07/21  Yes [provider]  metFORMIN (GLUCOPHAGE-XR) 500 MG 24 hr tablet Take 500 mg by mouth daily with breakfast.   Yes [provider]  predniSONE (DELTASONE) 20 MG tablet Take 2 tablets (40 mg total) by mouth daily for 5  days. 05/25/21 05/30/21 Yes Eusebio Friendly B, PA-C  tiotropium (SPIRIVA HANDIHALER) 18 MCG inhalation capsule Place 1 capsule (18 mcg total) into inhaler and inhale daily. Patient taking differently: Place 18 mcg into inhaler and inhale daily as needed. 12/13/15  Yes Gouru, Deanna Artis, MD  acetaminophen (TYLENOL) 325 MG tablet Take 2 tablets (650 mg total) by mouth every 6 (six) hours as needed for mild pain (or Fever >/= 101). 12/13/15   Ramonita Lab, MD  diphenhydrAMINE (BENADRYL) 25 MG tablet Take 1 to 2 tablets every 4-6 hours as needed for swelling/allergy 02/13/21   Shirlee Latch, PA-C  EPINEPHrine 0.3 mg/0.3 mL IJ SOAJ injection Inject 0.3 mg into the muscle as needed for anaphylaxis (swelling of lips, tongue, throat, swallowing issue , breathing problem. Use and then call EMS). 02/13/21 02/13/22  Shirlee Latch, PA-C  famotidine (PEPCID) 20 MG tablet Take 1 tablet (20 mg total) by mouth 2 (two) times daily for 5 days. 02/13/21 02/18/21  Shirlee Latch, PA-C    Family History Family History  Problem Relation Age of Onset   CAD Mother    CAD Father    Breast cancer Neg Hx     Social History Social History   Tobacco Use   Smoking status: Former    Packs/day: 0.50    Years: 25.00    Pack years: 12.50    Types: Cigarettes    Quit date: 07/06/2014    Years since quitting: 6.8   Smokeless tobacco: Never  Vaping Use   Vaping Use: Never used  Substance Use Topics   Alcohol use: No    Alcohol/week: 0.0 standard drinks   Drug use: No     Allergies   Penicillins   Review of Systems Review of Systems  Constitutional:  Positive for fatigue. Negative for chills, diaphoresis and fever.  HENT:  Positive for congestion and rhinorrhea. Negative for ear pain, sinus pressure, sinus pain and sore throat.   Respiratory:  Positive for cough, shortness of breath and wheezing.   Cardiovascular:  Negative for chest pain.  Gastrointestinal:  Negative for abdominal pain, nausea and vomiting.   Musculoskeletal:  Negative for arthralgias and myalgias.  Skin:  Negative for rash.  Neurological:  Positive for headaches. Negative for weakness.  Hematological:  Negative for adenopathy.    Physical Exam Triage Vital Signs ED Triage Vitals  Enc Vitals Group     BP 05/25/21 1327 118/87     Pulse Rate 05/25/21 1327 84     Resp 05/25/21 1327 18     Temp 05/25/21 1327 98.7 F (37.1 C)     Temp Source 05/25/21 1327 Oral     SpO2 05/25/21 1327 100 %     Weight 05/25/21 1324 177 lb (80.3 kg)     Height 05/25/21 1324 5\' 2"  (1.575 m)     Head Circumference --      Peak Flow --      Pain  Score 05/25/21 1324 0     Pain Loc --      Pain Edu? --      Excl. in GC? --    No data found.  Updated Vital Signs BP 118/87 (BP Location: Left Arm)   Pulse 84   Temp 98.7 F (37.1 C) (Oral)   Resp 18   Ht 5\' 2"  (1.575 m)   Wt 177 lb (80.3 kg)   SpO2 100%   BMI 32.37 kg/m      Physical Exam Vitals and nursing note reviewed.  Constitutional:      General: She is not in acute distress.    Appearance: Normal appearance. She is not ill-appearing or toxic-appearing.  HENT:     Head: Normocephalic and atraumatic.     Right Ear: Tympanic membrane, ear canal and external ear normal.     Left Ear: Tympanic membrane, ear canal and external ear normal.     Nose: Congestion present.     Mouth/Throat:     Mouth: Mucous membranes are moist.     Pharynx: Oropharynx is clear.  Eyes:     General: No scleral icterus.       Right eye: No discharge.        Left eye: No discharge.     Conjunctiva/sclera: Conjunctivae normal.  Cardiovascular:     Rate and Rhythm: Normal rate and regular rhythm.     Heart sounds: Normal heart sounds.  Pulmonary:     Effort: Pulmonary effort is normal. No respiratory distress.     Breath sounds: Wheezing (few scattered wheezes throughout) present.  Musculoskeletal:     Cervical back: Neck supple.  Skin:    General: Skin is dry.  Neurological:     General: No  focal deficit present.     Mental Status: She is alert. Mental status is at baseline.     Motor: No weakness.     Gait: Gait normal.  Psychiatric:        Mood and Affect: Mood normal.        Behavior: Behavior normal.        Thought Content: Thought content normal.     UC Treatments / Results  Labs (all labs ordered are listed, but only abnormal results are displayed) Labs Reviewed  SARS CORONAVIRUS 2 (TAT 6-24 HRS)    EKG   Radiology No results found.  Procedures Procedures (including critical care time)  Medications Ordered in UC Medications - No data to display  Initial Impression / Assessment and Plan / UC Course  I have reviewed the triage vital signs and the nursing notes.  Pertinent labs & imaging results that were available during my care of the patient were reviewed by me and considered in my medical decision making (see chart for details).  70 year old female presenting for 2-day history of fatigue, productive cough, congestion, headaches and bilateral ear pressure.  Vitals are normal and stable and she is overall well-appearing.  Exam summary for nasal congestion as well as mild scattered wheezing throughout chest.  PCR COVID test obtained.  Current CDC guidelines, isolation protocol and ED precautions reviewed with patient.  Advised her she is positive for COVID-19 she would be a good candidate for antiviral therapy which we could send in for her.  Reviewed how to access lab results.  At this time, treating her COPD exacerbation with azithromycin.  Patient also given Bromfed-DM for cough and I printed a prescription for prednisone in case she feels that she  is not improving in the next few days.  ED precautions reviewed.  Final Clinical Impressions(s) / UC Diagnoses   Final diagnoses:  COPD exacerbation (HCC)  Cough  Nasal congestion     Discharge Instructions      -I have sent an antibiotic and a cough medication for your COPD exacerbation.  Continue to  use your inhalers.  We have obtained a COVID test and we will call you if your results are positive.  If your COVID test is positive we can send antiviral medications for you.  Increase rest and fluids. -Keep a close eye on your oxygen saturation and if it gets below 92% then you need to be seen again immediately.  Call EMS if you are feeling very short of breath with low oxygen. -Fill the prednisone if you are still feeling poorly in the next few days.  Your oxygen is 100% now so I do not think you necessarily need at this point.  You have received COVID testing today either for positive exposure, concerning symptoms that could be related to COVID infection, screening purposes, or re-testing after confirmed positive.  Your test obtained today checks for active viral infection in the last 1-2 weeks. If your test is negative now, you can still test positive later. So, if you do develop symptoms you should either get re-tested and/or isolate x 5 days and then strict mask use x 5 days (unvaccinated) or mask use x 10 days (vaccinated). Please follow CDC guidelines.  While Rapid antigen tests come back in 15-20 minutes, send out PCR/molecular test results typically come back within 1-3 days. In the mean time, if you are symptomatic, assume this could be a positive test and treat/monitor yourself as if you do have COVID.   We will call with test results if positive. Please download the MyChart app and set up a profile to access test results.   If symptomatic, go home and rest. Push fluids. Take Tylenol as needed for discomfort. Gargle warm salt water. Throat lozenges. Take Mucinex DM or Robitussin for cough. Humidifier in bedroom to ease coughing. Warm showers. Also review the COVID handout for more information.  COVID-19 INFECTION: The incubation period of COVID-19 is approximately 14 days after exposure, with most symptoms developing in roughly 4-5 days. Symptoms may range in severity from mild to  critically severe. Roughly 80% of those infected will have mild symptoms. People of any age may become infected with COVID-19 and have the ability to transmit the virus. The most common symptoms include: fever, fatigue, cough, body aches, headaches, sore throat, nasal congestion, shortness of breath, nausea, vomiting, diarrhea, changes in smell and/or taste.    COURSE OF ILLNESS Some patients may begin with mild disease which can progress quickly into critical symptoms. If your symptoms are worsening please call ahead to the Emergency Department and proceed there for further treatment. Recovery time appears to be roughly 1-2 weeks for mild symptoms and 3-6 weeks for severe disease.   GO IMMEDIATELY TO ER FOR FEVER YOU ARE UNABLE TO GET DOWN WITH TYLENOL, BREATHING PROBLEMS, CHEST PAIN, FATIGUE, LETHARGY, INABILITY TO EAT OR DRINK, ETC  QUARANTINE AND ISOLATION: To help decrease the spread of COVID-19 please remain isolated if you have COVID infection or are highly suspected to have COVID infection. This means -stay home and isolate to one room in the home if you live with others. Do not share a bed or bathroom with others while ill, sanitize and wipe down all countertops and  keep common areas clean and disinfected. Stay home for 5 days. If you have no symptoms or your symptoms are resolving after 5 days, you can leave your house. Continue to wear a mask around others for 5 additional days. If you have been in close contact (within 6 feet) of someone diagnosed with COVID 19, you are advised to quarantine in your home for 14 days as symptoms can develop anywhere from 2-14 days after exposure to the virus. If you develop symptoms, you  must isolate.  Most current guidelines for COVID after exposure -unvaccinated: isolate 5 days and strict mask use x 5 days. Test on day 5 is possible -vaccinated: wear mask x 10 days if symptoms do not develop -You do not necessarily need to be tested for COVID if you have +  exposure and  develop symptoms. Just isolate at home x10 days from symptom onset During this global pandemic, CDC advises to practice social distancing, try to stay at least 65ft away from others at all times. Wear a face covering. Wash and sanitize your hands regularly and avoid going anywhere that is not necessary.  KEEP IN MIND THAT THE COVID TEST IS NOT 100% ACCURATE AND YOU SHOULD STILL DO EVERYTHING TO PREVENT POTENTIAL SPREAD OF VIRUS TO OTHERS (WEAR MASK, WEAR GLOVES, WASH HANDS AND SANITIZE REGULARLY). IF INITIAL TEST IS NEGATIVE, THIS MAY NOT MEAN YOU ARE DEFINITELY NEGATIVE. MOST ACCURATE TESTING IS DONE 5-7 DAYS AFTER EXPOSURE.   It is not advised by CDC to get re-tested after receiving a positive COVID test since you can still test positive for weeks to months after you have already cleared the virus.   *If you have not been vaccinated for COVID, I strongly suggest you consider getting vaccinated as long as there are no contraindications.       ED Prescriptions     Medication Sig Dispense Auth. Provider   predniSONE (DELTASONE) 20 MG tablet Take 2 tablets (40 mg total) by mouth daily for 5 days. 10 tablet Eusebio Friendly B, PA-C   brompheniramine-pseudoephedrine-DM 30-2-10 MG/5ML syrup Take 5 mLs by mouth 4 (four) times daily as needed for up to 7 days. 118 mL Eusebio Friendly B, PA-C   azithromycin (ZITHROMAX) 250 MG tablet Take 1 tablet (250 mg total) by mouth daily. Take first 2 tablets together, then 1 every day until finished. 6 tablet Gareth Morgan      PDMP not reviewed this encounter.   Shirlee Latch, PA-C 05/25/21 1430

## 2021-05-25 NOTE — ED Triage Notes (Signed)
Pt c/o cough, nasal congestion, ear fullness and headache since yesterday. Pt states she does occasionally have a cough. Pt denies f/n/v/d or other symptoms.

## 2021-05-25 NOTE — Discharge Instructions (Addendum)
-I have sent an antibiotic and a cough medication for your COPD exacerbation.  Continue to use your inhalers.  We have obtained a COVID test and we will call you if your results are positive.  If your COVID test is positive we can send antiviral medications for you.  Increase rest and fluids. -Keep a close eye on your oxygen saturation and if it gets below 92% then you need to be seen again immediately.  Call EMS if you are feeling very short of breath with low oxygen. -Fill the prednisone if you are still feeling poorly in the next few days.  Your oxygen is 100% now so I do not think you necessarily need at this point.  You have received COVID testing today either for positive exposure, concerning symptoms that could be related to COVID infection, screening purposes, or re-testing after confirmed positive.  Your test obtained today checks for active viral infection in the last 1-2 weeks. If your test is negative now, you can still test positive later. So, if you do develop symptoms you should either get re-tested and/or isolate x 5 days and then strict mask use x 5 days (unvaccinated) or mask use x 10 days (vaccinated). Please follow CDC guidelines.  While Rapid antigen tests come back in 15-20 minutes, send out PCR/molecular test results typically come back within 1-3 days. In the mean time, if you are symptomatic, assume this could be a positive test and treat/monitor yourself as if you do have COVID.   We will call with test results if positive. Please download the MyChart app and set up a profile to access test results.   If symptomatic, go home and rest. Push fluids. Take Tylenol as needed for discomfort. Gargle warm salt water. Throat lozenges. Take Mucinex DM or Robitussin for cough. Humidifier in bedroom to ease coughing. Warm showers. Also review the COVID handout for more information.  COVID-19 INFECTION: The incubation period of COVID-19 is approximately 14 days after exposure, with most  symptoms developing in roughly 4-5 days. Symptoms may range in severity from mild to critically severe. Roughly 80% of those infected will have mild symptoms. People of any age may become infected with COVID-19 and have the ability to transmit the virus. The most common symptoms include: fever, fatigue, cough, body aches, headaches, sore throat, nasal congestion, shortness of breath, nausea, vomiting, diarrhea, changes in smell and/or taste.    COURSE OF ILLNESS Some patients may begin with mild disease which can progress quickly into critical symptoms. If your symptoms are worsening please call ahead to the Emergency Department and proceed there for further treatment. Recovery time appears to be roughly 1-2 weeks for mild symptoms and 3-6 weeks for severe disease.   GO IMMEDIATELY TO ER FOR FEVER YOU ARE UNABLE TO GET DOWN WITH TYLENOL, BREATHING PROBLEMS, CHEST PAIN, FATIGUE, LETHARGY, INABILITY TO EAT OR DRINK, ETC  QUARANTINE AND ISOLATION: To help decrease the spread of COVID-19 please remain isolated if you have COVID infection or are highly suspected to have COVID infection. This means -stay home and isolate to one room in the home if you live with others. Do not share a bed or bathroom with others while ill, sanitize and wipe down all countertops and keep common areas clean and disinfected. Stay home for 5 days. If you have no symptoms or your symptoms are resolving after 5 days, you can leave your house. Continue to wear a mask around others for 5 additional days. If you have been  in close contact (within 6 feet) of someone diagnosed with COVID 19, you are advised to quarantine in your home for 14 days as symptoms can develop anywhere from 2-14 days after exposure to the virus. If you develop symptoms, you  must isolate.  Most current guidelines for COVID after exposure -unvaccinated: isolate 5 days and strict mask use x 5 days. Test on day 5 is possible -vaccinated: wear mask x 10 days if  symptoms do not develop -You do not necessarily need to be tested for COVID if you have + exposure and  develop symptoms. Just isolate at home x10 days from symptom onset During this global pandemic, CDC advises to practice social distancing, try to stay at least 68ft away from others at all times. Wear a face covering. Wash and sanitize your hands regularly and avoid going anywhere that is not necessary.  KEEP IN MIND THAT THE COVID TEST IS NOT 100% ACCURATE AND YOU SHOULD STILL DO EVERYTHING TO PREVENT POTENTIAL SPREAD OF VIRUS TO OTHERS (WEAR MASK, WEAR GLOVES, WASH HANDS AND SANITIZE REGULARLY). IF INITIAL TEST IS NEGATIVE, THIS MAY NOT MEAN YOU ARE DEFINITELY NEGATIVE. MOST ACCURATE TESTING IS DONE 5-7 DAYS AFTER EXPOSURE.   It is not advised by CDC to get re-tested after receiving a positive COVID test since you can still test positive for weeks to months after you have already cleared the virus.   *If you have not been vaccinated for COVID, I strongly suggest you consider getting vaccinated as long as there are no contraindications.

## 2021-05-26 LAB — SARS CORONAVIRUS 2 (TAT 6-24 HRS): SARS Coronavirus 2: NEGATIVE

## 2021-06-11 ENCOUNTER — Other Ambulatory Visit: Payer: Self-pay

## 2021-06-11 ENCOUNTER — Ambulatory Visit
Admission: EM | Admit: 2021-06-11 | Discharge: 2021-06-11 | Disposition: A | Discharge status: Home / Self Care | Payer: Medicare Other | Attending: Family Medicine | Admitting: Family Medicine

## 2021-06-11 ENCOUNTER — Ambulatory Visit (INDEPENDENT_AMBULATORY_CARE_PROVIDER_SITE_OTHER): Payer: Medicare Other

## 2021-06-11 DIAGNOSIS — K5900 Constipation, unspecified: Secondary | ICD-10-CM | POA: Diagnosis not present

## 2021-06-11 DIAGNOSIS — K219 Gastro-esophageal reflux disease without esophagitis: Secondary | ICD-10-CM

## 2021-06-11 DIAGNOSIS — K5909 Other constipation: Secondary | ICD-10-CM | POA: Diagnosis not present

## 2021-06-11 HISTORY — DX: Gastro-esophageal reflux disease without esophagitis: K21.9

## 2021-06-11 MED ORDER — POLYETHYLENE GLYCOL 3350 17 GM/SCOOP PO POWD: ORAL | 0 refills | Status: DC

## 2021-06-11 MED ORDER — PANTOPRAZOLE SODIUM 40 MG PO TBEC: 40.0000 mg | DELAYED_RELEASE_TABLET | Freq: Every day | ORAL | 3 refills | Status: DC

## 2021-06-11 NOTE — Discharge Instructions (Signed)
Medication as prescribed.  If you develop any other worrisome symptoms, please go to the hospital.  Take care  Dr. Adriana Simas

## 2021-06-11 NOTE — ED Triage Notes (Addendum)
Pt c/o possible gas in her right abdomen since last Thursday, pt also reports constipation for 2 days. Pt also has some nausea. Pt denies hx of constipation, does have hx of GERD. Pt denies f/v/d or other symptoms.

## 2021-06-11 NOTE — ED Provider Notes (Signed)
[facility]    CSN: [id number] Arrival date & time: 06/11/21  0848      History   Chief Complaint Chief Complaint  Patient presents with   Constipation   Gastroesophageal Reflux    HPI  70 year old female presents for evaluation of "gas".  Patient states that she has had symptoms since Thursday.  Patient reports some right-sided chest discomfort which she attributes to "gas".  Patient has a history of GERD and is not taking any medications for this at this time.  She reports some nausea.  Additionally, patient also feels like she has had some constipation.  She states that she had a normal bowel movement yesterday.  She has some baseline shortness of breath.  No increasing shortness of breath.  Denies any discomfort at this time.  No relieving factors.  No vomiting.  No other complaints.  Past Medical History:  Diagnosis Date   COPD (chronic obstructive pulmonary disease) (HCC)    Diabetes mellitus without complication (HCC)    GERD (gastroesophageal reflux disease)    Hypertension     Patient Active Problem List   Diagnosis Date Noted   Sepsis (HCC) 07/08/2018   CAP (community acquired pneumonia) 07/08/2018   Diabetes (HCC) 07/08/2018   HTN (hypertension) 07/08/2018   COPD with acute exacerbation (HCC) 07/08/2018   Acute respiratory failure with hypoxia and hypercapnia (HCC) 12/10/2015    Past Surgical History:  Procedure Laterality Date   ABDOMINAL HYSTERECTOMY     COLONOSCOPY WITH PROPOFOL N/A 01/23/2017   Procedure: COLONOSCOPY WITH PROPOFOL;  Surgeon: Christena Deem, MD;  Location: [facility] ENDOSCOPY;  Service: Endoscopy;  Laterality: N/A;   FOOT SURGERY      OB History   No obstetric history on file.      Home Medications    Prior to Admission medications   Medication Sig Start Date End Date Taking? Authorizing Provider  acetaminophen (TYLENOL) 325 MG tablet Take 2 tablets (650 mg total) by mouth every 6 (six) hours as needed for mild pain (or  Fever >/= 101). 12/13/15  Yes Gouru, Aruna, MD  amLODipine (NORVASC) 5 MG tablet Take 1 tablet (5 mg total) by mouth daily. 07/12/18  Yes Delfino Lovett, MD  aspirin EC 81 MG tablet Take 81 mg by mouth daily.   Yes [provider]  benzonatate (TESSALON) 100 MG capsule Take 100 mg by mouth 3 (three) times daily as needed for cough.   Yes [provider]  BREO ELLIPTA 100-25 MCG/INH AEPB Inhale 1 puff into the lungs daily. 12/20/20  Yes [provider]  budesonide-formoterol (SYMBICORT) 160-4.5 MCG/ACT inhaler Inhale 2 puffs into the lungs 2 (two) times daily.   Yes [provider]  diclofenac (VOLTAREN) 75 MG EC tablet Take 75 mg by mouth 2 (two) times daily as needed.   Yes [provider]  EPINEPHrine 0.3 mg/0.3 mL IJ SOAJ injection Inject 0.3 mg into the muscle as needed for anaphylaxis (swelling of lips, tongue, throat, swallowing issue , breathing problem. Use and then call EMS). 02/13/21 02/13/22 Yes Eusebio Friendly B, PA-C  fluticasone (FLONASE) 50 MCG/ACT nasal spray Place 1 spray into both nostrils 2 (two) times daily. 07/07/18  Yes [provider]  hydrochlorothiazide (MICROZIDE) 12.5 MG capsule Take 12.5 mg by mouth daily. 03/29/21  Yes [provider]  ipratropium-albuterol (DUONEB) 0.5-2.5 (3) MG/3ML SOLN Take 3 mLs by nebulization every 4 (four) hours as needed. 02/14/17  Yes Irean Hong, MD  latanoprost (XALATAN) 0.005 % ophthalmic  solution latanoprost 0.005 % eye drops   Yes [provider]  levalbuterol (XOPENEX HFA) 45 MCG/ACT inhaler Inhale 2 puffs into the lungs every 4 (four) hours as needed for wheezing.   Yes [provider]  losartan (COZAAR) 100 MG tablet losartan 100 mg tablet 04/16/20  Yes [provider]  meloxicam (MOBIC) 15 MG tablet Take 15 mg by mouth daily. 04/07/21  Yes [provider]  metFORMIN (GLUCOPHAGE-XR) 500 MG 24 hr tablet Take 500 mg by mouth daily with breakfast.   Yes  [provider]  pantoprazole (PROTONIX) 40 MG tablet Take 1 tablet (40 mg total) by mouth daily. 06/11/21  Yes Yolunda Kloos G, DO  polyethylene glycol powder (GLYCOLAX/MIRALAX) 17 GM/SCOOP powder 17 g once or twice daily for constipation. 06/11/21  Yes Suhey Radford G, DO  tiotropium (SPIRIVA HANDIHALER) 18 MCG inhalation capsule Place 1 capsule (18 mcg total) into inhaler and inhale daily. Patient taking differently: Place 18 mcg into inhaler and inhale daily as needed. 12/13/15  Yes Gouru, Deanna Artis, MD  diphenhydrAMINE (BENADRYL) 25 MG tablet Take 1 to 2 tablets every 4-6 hours as needed for swelling/allergy 02/13/21   Shirlee Latch, PA-C    Family History Family History  Problem Relation Age of Onset   CAD Mother    CAD Father    Breast cancer Neg Hx     Social History Social History   Tobacco Use   Smoking status: Former    Packs/day: 0.50    Years: 25.00    Pack years: 12.50    Types: Cigarettes    Quit date: 07/06/2014    Years since quitting: 6.9   Smokeless tobacco: Never  Vaping Use   Vaping Use: Never used  Substance Use Topics   Alcohol use: No    Alcohol/week: 0.0 standard drinks   Drug use: No     Allergies   Penicillins   Review of Systems Review of Systems Per HPI  Physical Exam Triage Vital Signs ED Triage Vitals  Enc Vitals Group     BP 06/11/21 0903 105/62     Pulse Rate 06/11/21 0903 96     Resp 06/11/21 0903 18     Temp 06/11/21 0903 98.4 F (36.9 C)     Temp Source 06/11/21 0903 Oral     SpO2 06/11/21 0903 94 %     Weight 06/11/21 0859 170 lb (77.1 kg)     Height 06/11/21 0859 5\' 2"  (1.575 m)     Head Circumference --      Peak Flow --      Pain Score 06/11/21 0859 0     Pain Loc --      Pain Edu? --      Excl. in GC? --    Updated Vital Signs BP 105/62 (BP Location: Left Arm)   Pulse 96   Temp 98.4 F (36.9 C) (Oral)   Resp 18   Ht 5\' 2"  (1.575 m)   Wt 77.1 kg   SpO2 94%   BMI 31.09 kg/m   Visual Acuity Right Eye  Distance:   Left Eye Distance:   Bilateral Distance:    Right Eye Near:   Left Eye Near:    Bilateral Near:     Physical Exam Vitals and nursing note reviewed.  Constitutional:      General: She is not in acute distress.    Appearance: Normal appearance. She is not ill-appearing.  HENT:     Head: Normocephalic  and atraumatic.  Eyes:     General:        Right eye: No discharge.        Left eye: No discharge.     Conjunctiva/sclera: Conjunctivae normal.  Cardiovascular:     Rate and Rhythm: Normal rate and regular rhythm.     Heart sounds: No murmur heard. Pulmonary:     Effort: Pulmonary effort is normal. No respiratory distress.  Abdominal:     Palpations: Abdomen is soft.     Comments: Protuberant.  Nondistended.  Umbilical hernia noted.  No significant tenderness on exam.  Neurological:     Mental Status: She is alert.  Psychiatric:        Mood and Affect: Mood normal.        Behavior: Behavior normal.     UC Treatments / Results  Labs (all labs ordered are listed, but only abnormal results are displayed) Labs Reviewed - No data to display  EKG Interpretation: Sinus tachycardia at the rate of 125.  Normal intervals.  No ST or T wave changes.  Left axis deviation.  **Patient used Xopenex after walking to radiology which resulted in tachycardia.  Radiology DG Abd 1 View  Result Date: 06/11/2021 CLINICAL DATA:  Constipation for 2 days. EXAM: ABDOMEN - 1 VIEW COMPARISON:  None. FINDINGS: The bowel gas pattern is normal. Extensive bowel content is identified throughout colon. Prior cholecystectomy clips are noted. No radio-opaque calculi or other significant radiographic abnormality are seen. Degenerative joint changes of the spine noted. IMPRESSION: No bowel obstruction.  Constipation. Electronically Signed   By: Sherian Rein M.D.   On: 06/11/2021 09:51    Procedures Procedures (including critical care time)  Medications Ordered in UC Medications - No data to  display  Initial Impression / Assessment and Plan / UC Course  I have reviewed the triage vital signs and the nursing notes.  Pertinent labs & imaging results that were available during my care of the patient were reviewed by me and considered in my medical decision making (see chart for details).    70 year old female presents with the above complaints.  Patient's clinical picture consistent with GERD as well as constipation.  KUB was obtained and was independently interpreted by me.  Interpretation: Constipation with a large stool burden.  Treating with MiraLAX and Protonix.  Supportive care.  Final Clinical Impressions(s) / UC Diagnoses   Final diagnoses:  Constipation  Gastroesophageal reflux disease without esophagitis     Discharge Instructions      Medication as prescribed.  If you develop any other worrisome symptoms, please go to the hospital.  Take care  Dr. Adriana Simas      ED Prescriptions     Medication Sig Dispense Auth. Provider   pantoprazole (PROTONIX) 40 MG tablet Take 1 tablet (40 mg total) by mouth daily. 30 tablet Kashay Cavenaugh G, DO   polyethylene glycol powder (GLYCOLAX/MIRALAX) 17 GM/SCOOP powder 17 g once or twice daily for constipation. 500 g Tommie Sams, DO      PDMP not reviewed this encounter.   Tommie Sams, DO 06/11/21 1016

## 2021-06-24 ENCOUNTER — Other Ambulatory Visit: Payer: Self-pay | Admitting: Family Medicine

## 2021-06-24 DIAGNOSIS — Z13820 Encounter for screening for osteoporosis: Secondary | ICD-10-CM

## 2021-06-24 DIAGNOSIS — Z1231 Encounter for screening mammogram for malignant neoplasm of breast: Secondary | ICD-10-CM

## 2021-07-23 ENCOUNTER — Ambulatory Visit
Admission: EM | Admit: 2021-07-23 | Discharge: 2021-07-23 | Disposition: A | Discharge status: Home / Self Care | Payer: Medicare Other | Attending: Emergency Medicine | Admitting: Emergency Medicine

## 2021-07-23 ENCOUNTER — Other Ambulatory Visit: Payer: Self-pay

## 2021-07-23 DIAGNOSIS — R053 Chronic cough: Secondary | ICD-10-CM

## 2021-07-23 DIAGNOSIS — J441 Chronic obstructive pulmonary disease with (acute) exacerbation: Secondary | ICD-10-CM

## 2021-07-23 MED ORDER — METHYLPREDNISOLONE SODIUM SUCC 125 MG IJ SOLR
125.0000 mg | Freq: Once | INTRAMUSCULAR | Status: AC
  Administered 2021-07-23: 125 mg via INTRAMUSCULAR

## 2021-07-23 MED ORDER — PREDNISONE 10 MG (21) PO TBPK: ORAL_TABLET | Freq: Every day | ORAL | 0 refills | Status: DC

## 2021-07-23 NOTE — ED Triage Notes (Signed)
Pt here with C/O cough that started yesterday, pt has COPD and when she goes out into cold air it flares up, which she did yesterday.

## 2021-07-23 NOTE — Discharge Instructions (Signed)
Go to er if not better  Cont to use inhalers as needed

## 2021-07-23 NOTE — ED Provider Notes (Signed)
[facility]    CSN: [id number] Arrival date & time: 07/23/21  1149      History   Chief Complaint Chief Complaint  Patient presents with   Cough    HPI Katherine Carter is a 70 y.o. female.   Pt hx of copd states that she was out yesterday in the cold and began to have a lot of coughing. Has used several inhalers for her copd which have helped some. Pt denies fever, no chest pain, no congestion or cold like sx.    Past Medical History:  Diagnosis Date   COPD (chronic obstructive pulmonary disease) ([place])    Diabetes mellitus without complication (HCC)    GERD (gastroesophageal reflux disease)    Hypertension     Patient Active Problem List   Diagnosis Date Noted   Sepsis ([place]) 07/08/2018   CAP (community acquired pneumonia) 07/08/2018   Diabetes ([place]) 07/08/2018   HTN (hypertension) 07/08/2018   COPD with acute exacerbation ([place]) 07/08/2018   Acute respiratory failure with hypoxia and hypercapnia ([place]) 12/10/2015    Past Surgical History:  Procedure Laterality Date   ABDOMINAL HYSTERECTOMY     COLONOSCOPY WITH PROPOFOL N/A 01/23/2017   Procedure: COLONOSCOPY WITH PROPOFOL;  Surgeon: Lollie Sails, MD;  Location: [facility] ENDOSCOPY;  Service: Endoscopy;  Laterality: N/A;   FOOT SURGERY      OB History   No obstetric history on file.      Home Medications    Prior to Admission medications   Medication Sig Start Date End Date Taking? Authorizing Provider  acetaminophen (TYLENOL) 325 MG tablet Take 2 tablets (650 mg total) by mouth every 6 (six) hours as needed for mild pain (or Fever >/= 101). 12/13/15  Yes Gouru, Aruna, MD  amLODipine (NORVASC) 5 MG tablet Take 1 tablet (5 mg total) by mouth daily. 07/12/18  Yes Max Sane, MD  aspirin EC 81 MG tablet Take 81 mg by mouth daily.   Yes [provider]  BREO ELLIPTA 100-25 MCG/INH AEPB Inhale 1 puff into the lungs daily. 12/20/20  Yes [provider]  budesonide-formoterol  (SYMBICORT) 160-4.5 MCG/ACT inhaler Inhale 2 puffs into the lungs 2 (two) times daily.   Yes [provider]  diclofenac (VOLTAREN) 75 MG EC tablet Take 75 mg by mouth 2 (two) times daily as needed.   Yes [provider]  diphenhydrAMINE (BENADRYL) 25 MG tablet Take 1 to 2 tablets every 4-6 hours as needed for swelling/allergy 02/13/21  Yes Laurene Footman B, PA-C  EPINEPHrine 0.3 mg/0.3 mL IJ SOAJ injection Inject 0.3 mg into the muscle as needed for anaphylaxis (swelling of lips, tongue, throat, swallowing issue , breathing problem. Use and then call EMS). 02/13/21 02/13/22 Yes Laurene Footman B, PA-C  fluticasone (FLONASE) 50 MCG/ACT nasal spray Place 1 spray into both nostrils 2 (two) times daily. 07/07/18  Yes [provider]  ipratropium-albuterol (DUONEB) 0.5-2.5 (3) MG/3ML SOLN Take 3 mLs by nebulization every 4 (four) hours as needed. 02/14/17  Yes Paulette Blanch, MD  latanoprost (XALATAN) 0.005 % ophthalmic solution latanoprost 0.005 % eye drops   Yes [provider]  levalbuterol (XOPENEX HFA) 45 MCG/ACT inhaler Inhale 2 puffs into the lungs every 4 (four) hours as needed for wheezing.   Yes [provider]  losartan (COZAAR) 100 MG tablet losartan 100 mg tablet 04/16/20  Yes [provider]  meloxicam (MOBIC) 15 MG tablet Take 15 mg by mouth daily. 04/07/21  Yes [provider]  metFORMIN (GLUCOPHAGE-XR) 500 MG 24 hr tablet Take 500 mg by mouth daily with breakfast.   Yes [provider]  pantoprazole (PROTONIX) 40 MG tablet Take 1 tablet (40 mg total) by mouth daily. 06/11/21  Yes Cook, Jayce G, DO  polyethylene glycol powder (GLYCOLAX/MIRALAX) 17 GM/SCOOP powder 17 g once or twice daily for constipation. 06/11/21  Yes Cook, Jayce G, DO  predniSONE (STERAPRED UNI-PAK 21 TAB) 10 MG (21) TBPK tablet Take by mouth daily. Take 6 tabs by mouth daily  for 2 days, then 5 tabs for 2 days, then 4 tabs for 2 days, then 3 tabs for 2 days, 2 tabs  for 2 days, then 1 tab by mouth daily for 2 days 07/23/21  Yes Coralyn Mark, NP  tiotropium (SPIRIVA HANDIHALER) 18 MCG inhalation capsule Place 1 capsule (18 mcg total) into inhaler and inhale daily. Patient taking differently: Place 18 mcg into inhaler and inhale daily as needed. 12/13/15  Yes Gouru, Deanna Artis, MD  benzonatate (TESSALON) 100 MG capsule Take 100 mg by mouth 3 (three) times daily as needed for cough.    [provider]  hydrochlorothiazide (MICROZIDE) 12.5 MG capsule Take 12.5 mg by mouth daily. 03/29/21   [provider]    Family History Family History  Problem Relation Age of Onset   CAD Mother    CAD Father    Breast cancer Neg Hx     Social History Social History   Tobacco Use   Smoking status: Former    Packs/day: 0.50    Years: 25.00    Pack years: 12.50    Types: Cigarettes    Quit date: 07/06/2014    Years since quitting: 7.0   Smokeless tobacco: Never  Vaping Use   Vaping Use: Never used  Substance Use Topics   Alcohol use: No    Alcohol/week: 0.0 standard drinks   Drug use: No     Allergies   Penicillins   Review of Systems Review of Systems  Constitutional:  Negative for chills, fatigue and fever.  HENT: Negative.    Respiratory:  Positive for cough, shortness of breath and wheezing.   Cardiovascular: Negative.   Gastrointestinal: Negative.   Musculoskeletal: Negative.   Neurological: Negative.     Physical Exam Triage Vital Signs ED Triage Vitals  Enc Vitals Group     BP 07/23/21 1238 (!) 142/90     Pulse Rate 07/23/21 1238 (!) 103     Resp 07/23/21 1238 18     Temp 07/23/21 1238 98.6 F (37 C)     Temp Source 07/23/21 1238 Oral     SpO2 07/23/21 1238 95 %     Weight 07/23/21 1236 170 lb (77.1 kg)     Height 07/23/21 1236 5\' 2"  (1.575 m)     Head Circumference --      Peak Flow --      Pain Score 07/23/21 1236 4     Pain Loc --      Pain Edu? --      Excl. in GC? --    No data found.  Updated  Vital Signs BP (!) 142/90 (BP Location: Left Arm)   Pulse (!) 103   Temp 98.6 F (37 C) (Oral)   Resp 18   Ht 5\' 2"  (1.575 m)   Wt 170 lb (77.1 kg)   SpO2 95%   BMI 31.09 kg/m   Visual Acuity Right Eye Distance:   Left Eye Distance:   Bilateral  Distance:    Right Eye Near:   Left Eye Near:    Bilateral Near:     Physical Exam Constitutional:      Appearance: Normal appearance.  Cardiovascular:     Rate and Rhythm: Tachycardia present.  Pulmonary:     Breath sounds: Wheezing present.  Abdominal:     General: Abdomen is flat.  Musculoskeletal:        General: Normal range of motion.  Skin:    General: Skin is warm.     Capillary Refill: Capillary refill takes less than 2 seconds.  Neurological:     General: No focal deficit present.     Mental Status: She is alert.     UC Treatments / Results  Labs (all labs ordered are listed, but only abnormal results are displayed) Labs Reviewed - No data to display  EKG   Radiology No results found.  Procedures Procedures (including critical care time)  Medications Ordered in UC Medications  methylPREDNISolone sodium succinate (SOLU-MEDROL) 125 mg/2 mL injection 125 mg (125 mg Intramuscular Given 07/23/21 1414)    Initial Impression / Assessment and Plan / UC Course  I have reviewed the triage vital signs and the nursing notes.  Pertinent labs & imaging results that were available during my care of the patient were reviewed by me and considered in my medical decision making (see chart for details).     If sx become worse go to er  Cont to use inhalers as needed  Use humidifier as needed   Stay hydrated well  Final Clinical Impressions(s) / UC Diagnoses   Final diagnoses:  Chronic cough  COPD exacerbation ([place])     Discharge Instructions      Go to er if not better  Cont to use inhalers as needed       ED Prescriptions     Medication Sig Dispense Auth. Provider   predniSONE (STERAPRED  UNI-PAK 21 TAB) 10 MG (21) TBPK tablet Take by mouth daily. Take 6 tabs by mouth daily  for 2 days, then 5 tabs for 2 days, then 4 tabs for 2 days, then 3 tabs for 2 days, 2 tabs for 2 days, then 1 tab by mouth daily for 2 days 42 tablet Marney Setting, NP      PDMP not reviewed this encounter.   Marney Setting, NP 07/23/21 [phone]

## 2021-09-02 ENCOUNTER — Inpatient Hospital Stay
Admission: EM | Admit: 2021-09-02 | Discharge: 2021-09-05 | DRG: 189 | Disposition: A | Discharge status: Home / Self Care | Payer: Medicare Other | Attending: Internal Medicine | Admitting: Internal Medicine

## 2021-09-02 ENCOUNTER — Encounter: Payer: Self-pay | Admitting: Internal Medicine

## 2021-09-02 ENCOUNTER — Other Ambulatory Visit: Payer: Self-pay

## 2021-09-02 ENCOUNTER — Emergency Department: Payer: Medicare Other

## 2021-09-02 DIAGNOSIS — E1165 Type 2 diabetes mellitus with hyperglycemia: Secondary | ICD-10-CM | POA: Diagnosis present

## 2021-09-02 DIAGNOSIS — E785 Hyperlipidemia, unspecified: Secondary | ICD-10-CM | POA: Diagnosis present

## 2021-09-02 DIAGNOSIS — Z791 Long term (current) use of non-steroidal anti-inflammatories (NSAID): Secondary | ICD-10-CM

## 2021-09-02 DIAGNOSIS — J9601 Acute respiratory failure with hypoxia: Secondary | ICD-10-CM | POA: Diagnosis present

## 2021-09-02 DIAGNOSIS — Z87891 Personal history of nicotine dependence: Secondary | ICD-10-CM

## 2021-09-02 DIAGNOSIS — J441 Chronic obstructive pulmonary disease with (acute) exacerbation: Secondary | ICD-10-CM | POA: Diagnosis not present

## 2021-09-02 DIAGNOSIS — E7800 Pure hypercholesterolemia, unspecified: Secondary | ICD-10-CM | POA: Diagnosis not present

## 2021-09-02 DIAGNOSIS — Z90710 Acquired absence of both cervix and uterus: Secondary | ICD-10-CM

## 2021-09-02 DIAGNOSIS — J439 Emphysema, unspecified: Secondary | ICD-10-CM | POA: Diagnosis present

## 2021-09-02 DIAGNOSIS — I2510 Atherosclerotic heart disease of native coronary artery without angina pectoris: Secondary | ICD-10-CM | POA: Diagnosis present

## 2021-09-02 DIAGNOSIS — Z20822 Contact with and (suspected) exposure to covid-19: Secondary | ICD-10-CM | POA: Diagnosis present

## 2021-09-02 DIAGNOSIS — T380X5A Adverse effect of glucocorticoids and synthetic analogues, initial encounter: Secondary | ICD-10-CM | POA: Diagnosis present

## 2021-09-02 DIAGNOSIS — Z79899 Other long term (current) drug therapy: Secondary | ICD-10-CM | POA: Diagnosis not present

## 2021-09-02 DIAGNOSIS — J9600 Acute respiratory failure, unspecified whether with hypoxia or hypercapnia: Secondary | ICD-10-CM | POA: Diagnosis present

## 2021-09-02 DIAGNOSIS — Z7984 Long term (current) use of oral hypoglycemic drugs: Secondary | ICD-10-CM | POA: Diagnosis not present

## 2021-09-02 DIAGNOSIS — Z7951 Long term (current) use of inhaled steroids: Secondary | ICD-10-CM

## 2021-09-02 DIAGNOSIS — N179 Acute kidney failure, unspecified: Secondary | ICD-10-CM | POA: Diagnosis present

## 2021-09-02 DIAGNOSIS — Z7982 Long term (current) use of aspirin: Secondary | ICD-10-CM

## 2021-09-02 DIAGNOSIS — K219 Gastro-esophageal reflux disease without esophagitis: Secondary | ICD-10-CM | POA: Diagnosis present

## 2021-09-02 DIAGNOSIS — Z8249 Family history of ischemic heart disease and other diseases of the circulatory system: Secondary | ICD-10-CM

## 2021-09-02 DIAGNOSIS — E119 Type 2 diabetes mellitus without complications: Secondary | ICD-10-CM

## 2021-09-02 DIAGNOSIS — Z880 Allergy status to penicillin: Secondary | ICD-10-CM | POA: Diagnosis not present

## 2021-09-02 DIAGNOSIS — E8729 Other acidosis: Secondary | ICD-10-CM | POA: Diagnosis present

## 2021-09-02 DIAGNOSIS — R778 Other specified abnormalities of plasma proteins: Secondary | ICD-10-CM | POA: Diagnosis not present

## 2021-09-02 DIAGNOSIS — I10 Essential (primary) hypertension: Secondary | ICD-10-CM | POA: Diagnosis present

## 2021-09-02 DIAGNOSIS — Z6827 Body mass index (BMI) 27.0-27.9, adult: Secondary | ICD-10-CM

## 2021-09-02 DIAGNOSIS — E669 Obesity, unspecified: Secondary | ICD-10-CM | POA: Diagnosis present

## 2021-09-02 DIAGNOSIS — R0603 Acute respiratory distress: Secondary | ICD-10-CM

## 2021-09-02 DIAGNOSIS — R0602 Shortness of breath: Secondary | ICD-10-CM | POA: Diagnosis not present

## 2021-09-02 DIAGNOSIS — J9602 Acute respiratory failure with hypercapnia: Secondary | ICD-10-CM | POA: Diagnosis present

## 2021-09-02 LAB — BLOOD GAS, ARTERIAL
Acid-Base Excess: 0.1 mmol/L (ref 0.0–2.0)
Bicarbonate: 26.9 mmol/L (ref 20.0–28.0)
Delivery systems: POSITIVE
Expiratory PAP: 5
FIO2: 60
Inspiratory PAP: 10
O2 Saturation: 100 %
pCO2 arterial: 51 mmHg — ABNORMAL HIGH (ref 32.0–48.0)
pH, Arterial: 7.33 — ABNORMAL LOW (ref 7.350–7.450)
pO2, Arterial: 249 mmHg — ABNORMAL HIGH (ref 83.0–108.0)

## 2021-09-02 LAB — CBC WITH DIFFERENTIAL/PLATELET
Abs Immature Granulocytes: 0.02 10*3/uL (ref 0.00–0.07)
Basophils Absolute: 0.1 10*3/uL (ref 0.0–0.1)
Basophils Relative: 1 %
Eosinophils Absolute: 0.3 10*3/uL (ref 0.0–0.5)
Eosinophils Relative: 3 %
HCT: 43 % (ref 36.0–46.0)
Hemoglobin: 13.5 g/dL (ref 12.0–15.0)
Immature Granulocytes: 0 %
Lymphocytes Relative: 47 %
Lymphs Abs: 5 10*3/uL — ABNORMAL HIGH (ref 0.7–4.0)
MCH: 29 pg (ref 26.0–34.0)
MCHC: 31.4 g/dL (ref 30.0–36.0)
MCV: 92.5 fL (ref 80.0–100.0)
Monocytes Absolute: 0.8 10*3/uL (ref 0.1–1.0)
Monocytes Relative: 8 %
Neutro Abs: 4.3 10*3/uL (ref 1.7–7.7)
Neutrophils Relative %: 41 %
Platelets: 256 10*3/uL (ref 150–400)
RBC: 4.65 MIL/uL (ref 3.87–5.11)
RDW: 14.4 % (ref 11.5–15.5)
WBC: 10.5 10*3/uL (ref 4.0–10.5)
nRBC: 0 % (ref 0.0–0.2)

## 2021-09-02 LAB — COMPREHENSIVE METABOLIC PANEL
ALT: 23 U/L (ref 0–44)
AST: 25 U/L (ref 15–41)
Albumin: 4.1 g/dL (ref 3.5–5.0)
Alkaline Phosphatase: 89 U/L (ref 38–126)
Anion gap: 6 (ref 5–15)
BUN: 25 mg/dL — ABNORMAL HIGH (ref 8–23)
CO2: 26 mmol/L (ref 22–32)
Calcium: 9.7 mg/dL (ref 8.9–10.3)
Chloride: 106 mmol/L (ref 98–111)
Creatinine, Ser: 1.54 mg/dL — ABNORMAL HIGH (ref 0.44–1.00)
GFR, Estimated: 36 mL/min — ABNORMAL LOW (ref >60)
Glucose, Bld: 240 mg/dL — ABNORMAL HIGH (ref 70–99)
Potassium: 4.6 mmol/L (ref 3.5–5.1)
Sodium: 138 mmol/L (ref 135–145)
Total Bilirubin: 0.6 mg/dL (ref 0.3–1.2)
Total Protein: 7.9 g/dL (ref 6.5–8.1)

## 2021-09-02 LAB — MAGNESIUM: Magnesium: 3.4 mg/dL — ABNORMAL HIGH (ref 1.7–2.4)

## 2021-09-02 LAB — RESP PANEL BY RT-PCR (FLU A&B, COVID) ARPGX2
Influenza A by PCR: NEGATIVE
Influenza B by PCR: NEGATIVE
SARS Coronavirus 2 by RT PCR: NEGATIVE

## 2021-09-02 LAB — BRAIN NATRIURETIC PEPTIDE: B Natriuretic Peptide: 14.2 pg/mL (ref 0.0–100.0)

## 2021-09-02 LAB — TROPONIN I (HIGH SENSITIVITY): Troponin I (High Sensitivity): 14 ng/L (ref <18)

## 2021-09-02 MED ORDER — ALBUTEROL SULFATE (2.5 MG/3ML) 0.083% IN NEBU
2.5000 mg | INHALATION_SOLUTION | RESPIRATORY_TRACT | Status: DC
  Administered 2021-09-02 – 2021-09-04 (×4): 2.5 mg via RESPIRATORY_TRACT
  Filled 2021-09-02 (×3): qty 3

## 2021-09-02 MED ORDER — HYDRALAZINE HCL 10 MG PO TABS
10.0000 mg | ORAL_TABLET | Freq: Four times a day (QID) | ORAL | Status: DC | PRN
  Filled 2021-09-02: qty 1

## 2021-09-02 MED ORDER — ACETAMINOPHEN 325 MG PO TABS: 650.0000 mg | ORAL_TABLET | Freq: Once | ORAL | Status: DC

## 2021-09-02 MED ORDER — AZITHROMYCIN 250 MG PO TABS
500.0000 mg | ORAL_TABLET | ORAL | Status: DC
  Administered 2021-09-03 – 2021-09-04 (×2): 500 mg via ORAL
  Filled 2021-09-02 (×2): qty 2

## 2021-09-02 MED ORDER — BUDESONIDE 0.25 MG/2ML IN SUSP
0.2500 mg | Freq: Two times a day (BID) | RESPIRATORY_TRACT | Status: DC
  Administered 2021-09-02 – 2021-09-05 (×5): 0.25 mg via RESPIRATORY_TRACT
  Filled 2021-09-02 (×5): qty 2

## 2021-09-02 MED ORDER — PANTOPRAZOLE SODIUM 40 MG PO TBEC
40.0000 mg | DELAYED_RELEASE_TABLET | Freq: Every day | ORAL | Status: DC
  Administered 2021-09-03 – 2021-09-05 (×3): 40 mg via ORAL
  Filled 2021-09-02 (×3): qty 1

## 2021-09-02 MED ORDER — ACETAMINOPHEN 325 MG PO TABS
650.0000 mg | ORAL_TABLET | Freq: Four times a day (QID) | ORAL | Status: DC | PRN
  Administered 2021-09-02 – 2021-09-04 (×3): 650 mg via ORAL
  Filled 2021-09-02 (×3): qty 2

## 2021-09-02 MED ORDER — METHYLPREDNISOLONE SODIUM SUCC 40 MG IJ SOLR
40.0000 mg | Freq: Two times a day (BID) | INTRAMUSCULAR | Status: DC
  Administered 2021-09-03 – 2021-09-05 (×5): 40 mg via INTRAVENOUS
  Filled 2021-09-02 (×5): qty 1

## 2021-09-02 MED ORDER — SODIUM CHLORIDE 0.9 % IV SOLN
500.0000 mg | INTRAVENOUS | Status: AC
  Administered 2021-09-02: 23:00:00 500 mg via INTRAVENOUS
  Filled 2021-09-02: qty 5

## 2021-09-02 MED ORDER — LOSARTAN POTASSIUM 50 MG PO TABS
100.0000 mg | ORAL_TABLET | Freq: Every day | ORAL | Status: DC
  Administered 2021-09-03 – 2021-09-05 (×3): 100 mg via ORAL
  Filled 2021-09-02 (×3): qty 2

## 2021-09-02 MED ORDER — ASPIRIN EC 81 MG PO TBEC
81.0000 mg | DELAYED_RELEASE_TABLET | Freq: Every day | ORAL | Status: DC
  Administered 2021-09-03 – 2021-09-05 (×3): 81 mg via ORAL
  Filled 2021-09-02 (×3): qty 1

## 2021-09-02 MED ORDER — IPRATROPIUM BROMIDE 0.02 % IN SOLN
0.5000 mg | RESPIRATORY_TRACT | Status: DC
  Administered 2021-09-02 – 2021-09-04 (×3): 0.5 mg via RESPIRATORY_TRACT
  Filled 2021-09-02 (×3): qty 2.5

## 2021-09-02 MED ORDER — ENOXAPARIN SODIUM 40 MG/0.4ML IJ SOSY
40.0000 mg | PREFILLED_SYRINGE | INTRAMUSCULAR | Status: DC
  Administered 2021-09-02 – 2021-09-04 (×3): 40 mg via SUBCUTANEOUS
  Filled 2021-09-02 (×3): qty 0.4

## 2021-09-02 MED ORDER — ALBUTEROL SULFATE (2.5 MG/3ML) 0.083% IN NEBU
2.5000 mg | INHALATION_SOLUTION | RESPIRATORY_TRACT | Status: DC | PRN
  Administered 2021-09-04: 2.5 mg via RESPIRATORY_TRACT
  Filled 2021-09-02: qty 3

## 2021-09-02 MED ORDER — ACETAMINOPHEN 650 MG RE SUPP: 650.0000 mg | Freq: Four times a day (QID) | RECTAL | Status: DC | PRN

## 2021-09-02 MED ORDER — IPRATROPIUM-ALBUTEROL 0.5-2.5 (3) MG/3ML IN SOLN
9.0000 mL | Freq: Once | RESPIRATORY_TRACT | Status: AC
  Administered 2021-09-02: 21:00:00 9 mL via RESPIRATORY_TRACT
  Filled 2021-09-02: qty 9

## 2021-09-02 MED ORDER — INSULIN ASPART 100 UNIT/ML IJ SOLN
0.0000 [IU] | Freq: Three times a day (TID) | INTRAMUSCULAR | Status: DC
  Administered 2021-09-03: 7 [IU] via SUBCUTANEOUS
  Administered 2021-09-03: 5 [IU] via SUBCUTANEOUS
  Administered 2021-09-04: 2 [IU] via SUBCUTANEOUS
  Administered 2021-09-04 (×2): 3 [IU] via SUBCUTANEOUS
  Administered 2021-09-05: 2 [IU] via SUBCUTANEOUS
  Administered 2021-09-05: 7 [IU] via SUBCUTANEOUS
  Filled 2021-09-02 (×8): qty 1

## 2021-09-02 NOTE — ED Notes (Signed)
Pt given a warm blanket 

## 2021-09-02 NOTE — ED Provider Notes (Signed)
[facility] Emergency Department Provider Note ____________________________________________   Event Date/Time   First MD Initiated Contact with Patient 09/02/21 2105     (approximate)  I have reviewed the triage vital signs and the nursing notes.  HISTORY  Chief Complaint Respiratory Distress   HPI Katherine Carter is a 70 y.o. femalewho presents to the ED for evaluation of SOB  Chart review indicates history of COPD on as needed and nighttime oxygen only.  Patient presents to the ED by EMS for evaluation respiratory distress.  They found her with very poor air movement and she was provided prehospital steroids, breathing treatments, CPAP and IM epi.  History somewhat limited initially due to acuity of condition and her respiratory status.  Later, after husband arrives, sounds like patient was at her baseline until she was moving boxes and putting Christmas decorations away this afternoon, when she rapidly began developing shortness of breath over the past couple hours prior to arrival.  Not improved with home nebulizers and breathing treatments so husband called 911.   Past Medical History:  Diagnosis Date   COPD (chronic obstructive pulmonary disease) (HCC)    Diabetes mellitus without complication (HCC)    GERD (gastroesophageal reflux disease)    Hypertension     Patient Active Problem List   Diagnosis Date Noted   Acute respiratory failure with hypoxemia (HCC) 09/02/2021   Sepsis (HCC) 07/08/2018   CAP (community acquired pneumonia) 07/08/2018   Diabetes (HCC) 07/08/2018   HTN (hypertension) 07/08/2018   COPD exacerbation (HCC) 07/08/2018   Acute respiratory failure with hypoxia and hypercapnia (HCC) 12/10/2015    Past Surgical History:  Procedure Laterality Date   ABDOMINAL HYSTERECTOMY     COLONOSCOPY WITH PROPOFOL N/A 01/23/2017   Procedure: COLONOSCOPY WITH PROPOFOL;  Surgeon: Christena Deem, MD;  Location: [facility] ENDOSCOPY;   Service: Endoscopy;  Laterality: N/A;   FOOT SURGERY      Prior to Admission medications   Medication Sig Start Date End Date Taking? Authorizing Provider  acetaminophen (TYLENOL) 325 MG tablet Take 2 tablets (650 mg total) by mouth every 6 (six) hours as needed for mild pain (or Fever >/= 101). 12/13/15  Yes Gouru, Deanna Artis, MD  aspirin EC 81 MG tablet Take 81 mg by mouth daily.   Yes [provider]  budesonide-formoterol (SYMBICORT) 160-4.5 MCG/ACT inhaler Inhale 2 puffs into the lungs 2 (two) times daily.   Yes [provider]  levalbuterol (XOPENEX HFA) 45 MCG/ACT inhaler Inhale 2 puffs into the lungs every 4 (four) hours as needed for wheezing.   Yes [provider]  meloxicam (MOBIC) 15 MG tablet Take 7.5 mg by mouth daily. 04/07/21  Yes [provider]  metFORMIN (GLUCOPHAGE-XR) 500 MG 24 hr tablet Take 500 mg by mouth daily with breakfast.   Yes [provider]  pantoprazole (PROTONIX) 40 MG tablet Take 1 tablet (40 mg total) by mouth daily. 06/11/21  Yes Cook, Jayce G, DO  predniSONE (STERAPRED UNI-PAK 21 TAB) 10 MG (21) TBPK tablet Take by mouth daily. Take 6 tabs by mouth daily  for 2 days, then 5 tabs for 2 days, then 4 tabs for 2 days, then 3 tabs for 2 days, 2 tabs for 2 days, then 1 tab by mouth daily for 2 days 07/23/21  Yes Maple Mirza L, NP  amLODipine (NORVASC) 5 MG tablet Take 1 tablet (5 mg total) by mouth daily. Patient not taking: Reported on 09/02/2021 07/12/18   Delfino Lovett, MD  benzonatate (TESSALON) 100 MG capsule Take 100 mg by mouth 3 (three) times daily as needed for cough. Patient not taking: Reported on 09/02/2021    [provider]  BREO ELLIPTA 100-25 MCG/INH AEPB Inhale 1 puff into the lungs daily. Patient not taking: Reported on 09/02/2021 12/20/20   [provider]  diclofenac (VOLTAREN) 75 MG EC tablet Take 75 mg by mouth 2 (two) times daily as needed. Patient not taking: Reported on 09/02/2021     [provider]  diphenhydrAMINE (BENADRYL) 25 MG tablet Take 1 to 2 tablets every 4-6 hours as needed for swelling/allergy Patient not taking: Reported on 09/02/2021 02/13/21   Shirlee Latch, PA-C  EPINEPHrine 0.3 mg/0.3 mL IJ SOAJ injection Inject 0.3 mg into the muscle as needed for anaphylaxis (swelling of lips, tongue, throat, swallowing issue , breathing problem. Use and then call EMS). Patient not taking: Reported on 09/02/2021 02/13/21 02/13/22  Eusebio Friendly B, PA-C  fluticasone [facility]) 50 MCG/ACT nasal spray Place 1 spray into both nostrils 2 (two) times daily. Patient not taking: Reported on 09/02/2021 07/07/18   [provider]  hydrochlorothiazide (MICROZIDE) 12.5 MG capsule Take 12.5 mg by mouth daily. Patient not taking: Reported on 09/02/2021 03/29/21   [provider]  ipratropium-albuterol (DUONEB) 0.5-2.5 (3) MG/3ML SOLN Take 3 mLs by nebulization every 4 (four) hours as needed. Patient not taking: Reported on 09/02/2021 02/14/17   Irean Hong, MD  latanoprost (XALATAN) 0.005 % ophthalmic solution latanoprost 0.005 % eye drops Patient not taking: Reported on 09/02/2021    [provider]  losartan (COZAAR) 100 MG tablet losartan 100 mg tablet Patient not taking: Reported on 09/02/2021 04/16/20   [provider]  polyethylene glycol powder (GLYCOLAX/MIRALAX) 17 GM/SCOOP powder 17 g once or twice daily for constipation. Patient not taking: Reported on 09/02/2021 06/11/21   Tommie Sams, DO  tiotropium (SPIRIVA HANDIHALER) 18 MCG inhalation capsule Place 1 capsule (18 mcg total) into inhaler and inhale daily. Patient not taking: Reported on 09/02/2021 12/13/15   Ramonita Lab, MD    Allergies Penicillins  Family History  Problem Relation Age of Onset   CAD Mother    CAD Father    Breast cancer Neg Hx     Social History Social History   Tobacco Use   Smoking status: Former    Packs/day: 0.50    Years: 25.00    Pack years:  12.50    Types: Cigarettes    Quit date: 07/06/2014    Years since quitting: 7.1   Smokeless tobacco: Never  Vaping Use   Vaping Use: Never used  Substance Use Topics   Alcohol use: No    Alcohol/week: 0.0 standard drinks   Drug use: No    Review of Systems  Unable to accurately assess due to presenting acuity of condition and respiratory status. ____________________________________________   PHYSICAL EXAM:  VITAL SIGNS: Vitals:   09/02/21 2230 09/02/21 2300  BP: (!) 142/82 (!) 142/85  Pulse: (!) 102 95  Resp: 16 (!) 23  Temp:    SpO2: 100% 100%    Constitutional: Alert and oriented.  Obese, sitting upright in bed on EMS CPAP.  Tachypneic and bracing on the stretcher.  Tripoding. Eyes: Conjunctivae are normal. PERRL. EOMI. Head: Atraumatic. Nose: No congestion/rhinnorhea. Mouth/Throat: Mucous membranes are moist.  Oropharynx non-erythematous. Neck: No stridor. No cervical spine tenderness to palpation. Cardiovascular: Tachycardic rate, regular rhythm. Good peripheral circulation. Respiratory: Tachypneic on EMS CPAP.  Wheezing throughout.  No  focal features. Gastrointestinal: Soft , nondistended, nontender to palpation.  Musculoskeletal: No joint effusions. No signs of acute trauma. Neurologic:  Normal speech and language. No gross focal neurologic deficits are appreciated.  Skin:  Skin is warm, dry and intact. No rash noted. Psychiatric: Mood and affect are normal. Speech and behavior are normal. ____________________________________________   LABS (all labs ordered are listed, but only abnormal results are displayed)  Labs Reviewed  BLOOD GAS, ARTERIAL - Abnormal; Notable for the following components:      Result Value   pH, Arterial 7.33 (*)    pCO2 arterial 51 (*)    pO2, Arterial 249 (*)    All other components within normal limits  COMPREHENSIVE METABOLIC PANEL - Abnormal; Notable for the following components:   Glucose, Bld 240 (*)    BUN 25 (*)     Creatinine, Ser 1.54 (*)    GFR, Estimated 36 (*)    All other components within normal limits  CBC WITH DIFFERENTIAL/PLATELET - Abnormal; Notable for the following components:   Lymphs Abs 5.0 (*)    All other components within normal limits  MAGNESIUM - Abnormal; Notable for the following components:   Magnesium 3.4 (*)    All other components within normal limits  RESP PANEL BY RT-PCR (FLU A&B, COVID) ARPGX2  EXPECTORATED SPUTUM ASSESSMENT W GRAM STAIN, RFLX TO RESP C  BRAIN NATRIURETIC PEPTIDE  HEMOGLOBIN A1C  HIV ANTIBODY (ROUTINE TESTING W REFLEX)  CBC  CREATININE, SERUM  BASIC METABOLIC PANEL  CBC  TROPONIN I (HIGH SENSITIVITY)  TROPONIN I (HIGH SENSITIVITY)   ____________________________________________  12 Lead EKG   ____________________________________________  RADIOLOGY  ED MD interpretation: CXR reviewed by me without evidence of acute cardiopulmonary pathology.  Official radiology report(s): DG Chest Portable 1 View  Result Date: 09/02/2021 CLINICAL DATA:  COPD exacerbation EXAM: PORTABLE CHEST 1 VIEW COMPARISON:  07/08/2018 FINDINGS: Mild bibasilar atelectasis. 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 No pneumothorax or pleural effusion. Cardiac size within normal limits. Pulmonary vascularity is otherwise normal. No acute bone abnormality. IMPRESSION: No active disease.  Emphysema. Electronically Signed   By: Helyn Numbers M.D.   On: 09/02/2021 21:41    ____________________________________________   PROCEDURES and INTERVENTIONS  Procedure(s) performed (including Critical Care):  .1-3 Lead EKG Interpretation Performed by: Delton Prairie, MD Authorized by: Delton Prairie, MD     Interpretation: abnormal     ECG rate:  102   ECG rate assessment: tachycardic     Rhythm: sinus tachycardia     Ectopy: none     Conduction: normal   .Critical Care Performed by: Delton Prairie, MD Authorized  by: Delton Prairie, MD   Critical care provider statement:    Critical care time (minutes):  30   Critical care time was exclusive of:  Separately billable procedures and treating other patients   Critical care was necessary to treat or prevent imminent or life-threatening deterioration of the following conditions:  Respiratory failure   Critical care was time spent personally by me on the following activities:  Development of treatment plan with patient or surrogate, discussions with consultants, evaluation of patient's response to treatment, examination of patient, ordering and review of laboratory studies, ordering and review of radiographic studies, ordering and performing treatments and interventions, pulse oximetry, re-evaluation of patient's condition and review of old charts  Medications  aspirin EC tablet 81 mg (has no administration in time range)  pantoprazole (PROTONIX)  EC tablet 40 mg (has no administration in time range)  insulin aspart (novoLOG) injection 0-9 Units (has no administration in time range)  azithromycin (ZITHROMAX) 500 mg in sodium chloride 0.9 % 250 mL IVPB (500 mg Intravenous New Bag/Given 09/02/21 2306)    Followed by  azithromycin (ZITHROMAX) tablet 500 mg (has no administration in time range)  enoxaparin (LOVENOX) injection 40 mg (40 mg Subcutaneous Given 09/02/21 2301)  acetaminophen (TYLENOL) tablet 650 mg (has no administration in time range)    Or  acetaminophen (TYLENOL) suppository 650 mg (has no administration in time range)  albuterol (PROVENTIL) (2.5 MG/3ML) 0.083% nebulizer solution 2.5 mg (has no administration in time range)  albuterol (PROVENTIL) (2.5 MG/3ML) 0.083% nebulizer solution 2.5 mg (has no administration in time range)  ipratropium (ATROVENT) nebulizer solution 0.5 mg (0.5 mg Nebulization Given 09/02/21 2309)  budesonide (PULMICORT) nebulizer solution 0.25 mg (0.25 mg Nebulization Given 09/02/21 2309)  methylPREDNISolone sodium succinate  (SOLU-MEDROL) 40 mg/mL injection 40 mg (has no administration in time range)  hydrALAZINE (APRESOLINE) tablet 10 mg (has no administration in time range)  losartan (COZAAR) tablet 100 mg (has no administration in time range)  acetaminophen (TYLENOL) tablet 650 mg (has no administration in time range)  ipratropium-albuterol (DUONEB) 0.5-2.5 (3) MG/3ML nebulizer solution 9 mL (9 mLs Nebulization Given 09/02/21 2122)    ____________________________________________   MDM / ED COURSE   70 year old female with COPD presents to the ED in respiratory distress with evidence of hypercapnic respiratory failure requiring medical admission.  She is tachycardic and tachypneic on arrival, but remains stable without shock or hypoxia.  Blood work is largely benign.  No evidence of sepsis, PTX or CAP.  ABG with slight respiratory acidosis with a pH of 7.33.  She settles out nicely on BiPAP and we will admit to medicine for further work-up and management.  Clinical Course as of 09/02/21 2316  Fri Sep 02, 2021  2141 Reassessed.  Husband at the bedside.  We discussed admission [DS]    Clinical Course User Index [DS] Delton Prairie, MD    ____________________________________________   FINAL CLINICAL IMPRESSION(S) / ED DIAGNOSES  Final diagnoses:  Respiratory distress  Acute respiratory failure with hypercapnia (HCC)  COPD exacerbation [facility])     ED Discharge Orders     None        Wells Mabe   Note:  This document was prepared using Dragon voice recognition software and may include unintentional dictation errors.    Delton Prairie, MD 09/02/21 [phone]

## 2021-09-02 NOTE — H&P (Signed)
History and Physical    Katherine Carter [id number] DOB: 01/20/1951 DOA: 09/02/2021  PCP: Donnie Coffin, MD  Patient coming from: Home.  Chief Complaint: Shortness of breath.  HPI: Katherine Carter is a 70 y.o. female with known history of COPD with prior history of tobacco abuse diabetes mellitus, hypertension presents to the ER with acute worsening of shortness of breath.  Patient states over the last 2 days patient has been getting increasingly shortness of breath with cough with no chest pain.  This evening when patient was cleaning her Christmas decorations and she acutely became short of breath EMS was called.  As per the report patient was given nebulizer steroids and epinephrine by the EMS.  ED Course: In the ER patient was found to have diffusely wheezing and tight chest with chest x-ray showing nothing acute and had to be placed on BiPAP ABG showing pH of 7.33 and PCO2 of 51.  Labs show creatinine 1.5 and blood glucose of 240 BNP and troponin were unremarkable.  COVID test was negative.  Patient admitted for acute respiratory failure with hypoxia secondary to COPD exacerbation.  Review of Systems: As per HPI, rest all negative.   Past Medical History:  Diagnosis Date   COPD (chronic obstructive pulmonary disease) ([place])    Diabetes mellitus without complication (HCC)    GERD (gastroesophageal reflux disease)    Hypertension     Past Surgical History:  Procedure Laterality Date   ABDOMINAL HYSTERECTOMY     COLONOSCOPY WITH PROPOFOL N/A 01/23/2017   Procedure: COLONOSCOPY WITH PROPOFOL;  Surgeon: Lollie Sails, MD;  Location: [facility] ENDOSCOPY;  Service: Endoscopy;  Laterality: N/A;   FOOT SURGERY       reports that she quit smoking about 7 years ago. Her smoking use included cigarettes. She has a 12.50 pack-year smoking history. She has never used smokeless tobacco. She reports that she does not drink alcohol and does not use drugs.  Allergies   Allergen Reactions   Penicillins Hives and Other (See Comments)    Has patient had a PCN reaction causing immediate rash, facial/tongue/throat swelling, SOB or lightheadedness with hypotension: No Has patient had a PCN reaction causing severe rash involving mucus membranes or skin necrosis: No Has patient had a PCN reaction that required hospitalization: No Has patient had a PCN reaction occurring within the last 10 years: No If all of the above answers are "NO", then may proceed with Cephalosporin use.    Family History  Problem Relation Age of Onset   CAD Mother    CAD Father    Breast cancer Neg Hx     Prior to Admission medications   Medication Sig Start Date End Date Taking? Authorizing Provider  acetaminophen (TYLENOL) 325 MG tablet Take 2 tablets (650 mg total) by mouth every 6 (six) hours as needed for mild pain (or Fever >/= 101). 12/13/15  Yes Gouru, Illene Silver, MD  aspirin EC 81 MG tablet Take 81 mg by mouth daily.   Yes [provider]  budesonide-formoterol (SYMBICORT) 160-4.5 MCG/ACT inhaler Inhale 2 puffs into the lungs 2 (two) times daily.   Yes [provider]  levalbuterol (XOPENEX HFA) 45 MCG/ACT inhaler Inhale 2 puffs into the lungs every 4 (four) hours as needed for wheezing.   Yes [provider]  meloxicam (MOBIC) 15 MG tablet Take 7.5 mg by mouth daily. 04/07/21  Yes [provider]  metFORMIN (GLUCOPHAGE-XR) 500 MG 24 hr tablet Take 500 mg by mouth  daily with breakfast.   Yes [provider]  pantoprazole (PROTONIX) 40 MG tablet Take 1 tablet (40 mg total) by mouth daily. 06/11/21  Yes Cook, Jayce G, DO  predniSONE (STERAPRED UNI-PAK 21 TAB) 10 MG (21) TBPK tablet Take by mouth daily. Take 6 tabs by mouth daily  for 2 days, then 5 tabs for 2 days, then 4 tabs for 2 days, then 3 tabs for 2 days, 2 tabs for 2 days, then 1 tab by mouth daily for 2 days 07/23/21  Yes Morley Kos L, NP  amLODipine (NORVASC) 5 MG tablet Take 1  tablet (5 mg total) by mouth daily. Patient not taking: Reported on 09/02/2021 07/12/18   Max Sane, MD  benzonatate (TESSALON) 100 MG capsule Take 100 mg by mouth 3 (three) times daily as needed for cough. Patient not taking: Reported on 09/02/2021    [provider]  BREO ELLIPTA 100-25 MCG/INH AEPB Inhale 1 puff into the lungs daily. Patient not taking: Reported on 09/02/2021 12/20/20   [provider]  diclofenac (VOLTAREN) 75 MG EC tablet Take 75 mg by mouth 2 (two) times daily as needed. Patient not taking: Reported on 09/02/2021    [provider]  diphenhydrAMINE (BENADRYL) 25 MG tablet Take 1 to 2 tablets every 4-6 hours as needed for swelling/allergy Patient not taking: Reported on 09/02/2021 02/13/21   Danton Clap, PA-C  EPINEPHrine 0.3 mg/0.3 mL IJ SOAJ injection Inject 0.3 mg into the muscle as needed for anaphylaxis (swelling of lips, tongue, throat, swallowing issue , breathing problem. Use and then call EMS). Patient not taking: Reported on 09/02/2021 02/13/21 02/13/22  Laurene Footman B, PA-C  fluticasone [facility]) 50 MCG/ACT nasal spray Place 1 spray into both nostrils 2 (two) times daily. Patient not taking: Reported on 09/02/2021 07/07/18   [provider]  hydrochlorothiazide (MICROZIDE) 12.5 MG capsule Take 12.5 mg by mouth daily. Patient not taking: Reported on 09/02/2021 03/29/21   [provider]  ipratropium-albuterol (DUONEB) 0.5-2.5 (3) MG/3ML SOLN Take 3 mLs by nebulization every 4 (four) hours as needed. Patient not taking: Reported on 09/02/2021 02/14/17   Paulette Blanch, MD  latanoprost (XALATAN) 0.005 % ophthalmic solution latanoprost 0.005 % eye drops Patient not taking: Reported on 09/02/2021    [provider]  losartan (COZAAR) 100 MG tablet losartan 100 mg tablet Patient not taking: Reported on 09/02/2021 04/16/20   [provider]  polyethylene glycol powder (GLYCOLAX/MIRALAX) 17 GM/SCOOP powder 17 g  once or twice daily for constipation. Patient not taking: Reported on 09/02/2021 06/11/21   Coral Spikes, DO  tiotropium (SPIRIVA HANDIHALER) 18 MCG inhalation capsule Place 1 capsule (18 mcg total) into inhaler and inhale daily. Patient not taking: Reported on 09/02/2021 12/13/15   Nicholes Mango, MD    Physical Exam: Constitutional: Moderately built and nourished. Vitals:   09/02/21 2114 09/02/21 2118 09/02/21 2130  BP:  (!) 161/108 (!) 149/99  Pulse:  (!) 107 (!) 103  Resp:  20 (!) 21  Temp:  (!) 97.4 F (36.3 C)   TempSrc:  Axillary   SpO2:  100% 100%  Weight: 68 kg    Height: 5\' 2"  (1.575 m)     Eyes: Anicteric no pallor. ENMT: No discharge from the ears eyes nose and mouth. Neck: No JVD appreciated no mass felt. Respiratory: Bilateral expiratory wheeze and no crepitations. Cardiovascular: S1-S2 heard. Abdomen: Soft nontender bowel sound present. Musculoskeletal: No edema. Skin: No rash. Neurologic: Alert awake oriented to time  place and person.  Moves all extremities. Psychiatric: Appears normal.  Normal affect.   Labs on Admission: I have personally reviewed following labs and imaging studies  CBC: Recent Labs  Lab 09/02/21 2109  WBC 10.5  NEUTROABS 4.3  HGB 13.5  HCT 43.0  MCV 92.5  PLT 256   Basic Metabolic Panel: Recent Labs  Lab 09/02/21 2109  NA 138  K 4.6  CL 106  CO2 26  GLUCOSE 240*  BUN 25*  CREATININE 1.54*  CALCIUM 9.7  MG 3.4*   GFR: Estimated Creatinine Clearance: 30.7 mL/min (A) (by C-G formula based on SCr of 1.54 mg/dL (H)). Liver Function Tests: Recent Labs  Lab 09/02/21 2109  AST 25  ALT 23  ALKPHOS 89  BILITOT 0.6  PROT 7.9  ALBUMIN 4.1   No results for input(s): LIPASE, AMYLASE in the last 168 hours. No results for input(s): AMMONIA in the last 168 hours. Coagulation Profile: No results for input(s): INR, PROTIME in the last 168 hours. Cardiac Enzymes: No results for input(s): CKTOTAL, CKMB, CKMBINDEX, TROPONINI in  the last 168 hours. BNP (last 3 results) No results for input(s): PROBNP in the last 8760 hours. HbA1C: No results for input(s): HGBA1C in the last 72 hours. CBG: No results for input(s): GLUCAP in the last 168 hours. Lipid Profile: No results for input(s): CHOL, HDL, LDLCALC, TRIG, CHOLHDL, LDLDIRECT in the last 72 hours. Thyroid Function Tests: No results for input(s): TSH, T4TOTAL, FREET4, T3FREE, THYROIDAB in the last 72 hours. Anemia Panel: No results for input(s): VITAMINB12, FOLATE, FERRITIN, TIBC, IRON, RETICCTPCT in the last 72 hours. Urine analysis: No results found for: COLORURINE, APPEARANCEUR, LABSPEC, PHURINE, GLUCOSEU, HGBUR, BILIRUBINUR, KETONESUR, PROTEINUR, UROBILINOGEN, NITRITE, LEUKOCYTESUR Sepsis Labs: @LABRCNTIP (procalcitonin:4,lacticidven:4) ) Recent Results (from the past 240 hour(s))  Resp Panel by RT-PCR (Flu A&B, Covid) Nasopharyngeal Swab     Status: None   Collection Time: 09/02/21  9:09 PM   Specimen: Nasopharyngeal Swab; Nasopharyngeal(NP) swabs in vial transport medium  Result Value Ref Range Status   SARS Coronavirus 2 by RT PCR NEGATIVE NEGATIVE Final    Comment: (NOTE) SARS-CoV-2 target nucleic acids are NOT DETECTED.  The SARS-CoV-2 RNA is generally detectable in upper respiratory specimens during the acute phase of infection. The lowest concentration of SARS-CoV-2 viral copies this assay can detect is 138 copies/mL. A negative result does not preclude SARS-Cov-2 infection and should not be used as the sole basis for treatment or other patient management decisions. A negative result may occur with  improper specimen collection/handling, submission of specimen other than nasopharyngeal swab, presence of viral mutation(s) within the areas targeted by this assay, and inadequate number of viral copies(<138 copies/mL). A negative result must be combined with clinical observations, patient history, and epidemiological information. The expected  result is Negative.  Fact Sheet for Patients:  09/04/21  Fact Sheet for Healthcare Providers:  BloggerCourse.com  This test is no t yet approved or cleared by the [employer] FDA and  has been authorized for detection and/or diagnosis of SARS-CoV-2 by FDA under an Emergency Use Authorization (EUA). This EUA will remain  in effect (meaning this test can be used) for the duration of the COVID-19 declaration under Section 564(b)(1) of the Act, 21 U.S.C.section 360bbb-3(b)(1), unless the authorization is terminated  or revoked sooner.       Influenza A by PCR NEGATIVE NEGATIVE Final   Influenza B by PCR NEGATIVE NEGATIVE Final    Comment: (NOTE) The Xpert Xpress SARS-CoV-2/FLU/RSV plus assay is  intended as an aid in the diagnosis of influenza from Nasopharyngeal swab specimens and should not be used as a sole basis for treatment. Nasal washings and aspirates are unacceptable for Xpert Xpress SARS-CoV-2/FLU/RSV testing.  Fact Sheet for Patients: EntrepreneurPulse.com.au  Fact Sheet for Healthcare Providers: IncredibleEmployment.be  This test is not yet approved or cleared by the [place] FDA and has been authorized for detection and/or diagnosis of SARS-CoV-2 by FDA under an Emergency Use Authorization (EUA). This EUA will remain in effect (meaning this test can be used) for the duration of the COVID-19 declaration under Section 564(b)(1) of the Act, 21 U.S.C. section 360bbb-3(b)(1), unless the authorization is terminated or revoked.  Performed at [facility], [address] [place], [place] [address]      Radiological Exams on Admission: DG Chest Portable 1 View  Result Date: 09/02/2021 CLINICAL DATA:  COPD exacerbation EXAM: PORTABLE CHEST 1 VIEW COMPARISON:  07/08/2018 FINDINGS: Mild bibasilar atelectasis. 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 No pneumothorax or pleural effusion. Cardiac size within normal limits. Pulmonary vascularity is otherwise normal. No acute bone abnormality. IMPRESSION: No active disease.  Emphysema. Electronically Signed   By: Fidela Salisbury M.D.   On: 09/02/2021 21:41    EKG: Independently reviewed.  Normal sinus rhythm with nonspecific ST changes.  Assessment/Plan Principal Problem:   Acute respiratory failure with hypoxemia (HCC) Active Problems:   Diabetes (HCC)   HTN (hypertension)   COPD exacerbation (HCC)    Acute respiratory failure with hypoxia presently on BiPAP secondary to COPD exacerbation for which patient has been placed on nebulizer, Pulmicort, IV steroids and antibiotics.  We will try to slowly wean off the BiPAP. Hypertension uncontrolled -patient states she takes losartan.  Which we will continue.  Note that patient's creatinine is increased when compared to 2019.  I have added as needed IV hydralazine.  Follow blood pressure trends. Acute renal failure versus chronic kidney disease stage III when compared to creatinine in 2019 it is 1.5 and in 2019 was normal.  If creatinine worsens may have to hold ARB. Diabetes mellitus type 2 with hyperglycemia probably secondary to the recent steroid use.  We will check hemoglobin A1c while patient using steroid I will keep patient on Lantus 10 units with sliding scale coverage.  Follow CBG trends.  Since patient has acute respiratory failure with hypoxia requiring BiPAP will need close monitoring for any further worsening and inpatient status.   DVT prophylaxis: Lovenox. Code Status: Full code. Family Communication: Patient's husband at the bedside. Disposition Plan: Home. Consults called: None. Admission status: Inpatient.   Rise Patience MD [facility] Pager [phone].  If 7PM-7AM, please contact night-coverage [email or web address] Password [email or web address]  09/02/2021, 10:21 PM

## 2021-09-02 NOTE — ED Notes (Signed)
Instructed to attempt to wean patient off bi-pap.  Patient placed on O2 at 3 liters via nasal cannula.

## 2021-09-02 NOTE — ED Triage Notes (Signed)
Patient to [facility] via EMS from home with Resp Distress for past 2 hours.  EMS interventions -  18g angiocath to right forearm, 18g angiocath to left forearm, solu medral 125 mg IV, 1 GM Mag IV, 0.3 mg Epi IM, DuoNeb x3, patient on EMS C-Pap 198/102.  Per EMS patient uses O2 at home at nights and as needed.

## 2021-09-03 DIAGNOSIS — I10 Essential (primary) hypertension: Secondary | ICD-10-CM

## 2021-09-03 DIAGNOSIS — R7989 Other specified abnormal findings of blood chemistry: Secondary | ICD-10-CM | POA: Insufficient documentation

## 2021-09-03 DIAGNOSIS — J441 Chronic obstructive pulmonary disease with (acute) exacerbation: Secondary | ICD-10-CM

## 2021-09-03 DIAGNOSIS — E7800 Pure hypercholesterolemia, unspecified: Secondary | ICD-10-CM

## 2021-09-03 DIAGNOSIS — R778 Other specified abnormalities of plasma proteins: Secondary | ICD-10-CM

## 2021-09-03 LAB — LIPID PANEL
Cholesterol: 198 mg/dL (ref 0–200)
HDL: 66 mg/dL (ref >40)
LDL Cholesterol: 116 mg/dL — ABNORMAL HIGH (ref 0–99)
Total CHOL/HDL Ratio: 3 RATIO
Triglycerides: 79 mg/dL (ref <150)
VLDL: 16 mg/dL (ref 0–40)

## 2021-09-03 LAB — CBC WITH DIFFERENTIAL/PLATELET
Abs Immature Granulocytes: 0.02 10*3/uL (ref 0.00–0.07)
Basophils Absolute: 0 10*3/uL (ref 0.0–0.1)
Basophils Relative: 0 %
Eosinophils Absolute: 0 10*3/uL (ref 0.0–0.5)
Eosinophils Relative: 0 %
HCT: 38.7 % (ref 36.0–46.0)
Hemoglobin: 12.2 g/dL (ref 12.0–15.0)
Immature Granulocytes: 0 %
Lymphocytes Relative: 10 %
Lymphs Abs: 0.6 10*3/uL — ABNORMAL LOW (ref 0.7–4.0)
MCH: 28.8 pg (ref 26.0–34.0)
MCHC: 31.5 g/dL (ref 30.0–36.0)
MCV: 91.5 fL (ref 80.0–100.0)
Monocytes Absolute: 0.1 10*3/uL (ref 0.1–1.0)
Monocytes Relative: 1 %
Neutro Abs: 5.5 10*3/uL (ref 1.7–7.7)
Neutrophils Relative %: 89 %
Platelets: 238 10*3/uL (ref 150–400)
RBC: 4.23 MIL/uL (ref 3.87–5.11)
RDW: 14.5 % (ref 11.5–15.5)
WBC: 6.2 10*3/uL (ref 4.0–10.5)
nRBC: 0 % (ref 0.0–0.2)

## 2021-09-03 LAB — COMPREHENSIVE METABOLIC PANEL
ALT: 23 U/L (ref 0–44)
AST: 27 U/L (ref 15–41)
Albumin: 3.7 g/dL (ref 3.5–5.0)
Alkaline Phosphatase: 75 U/L (ref 38–126)
Anion gap: 11 (ref 5–15)
BUN: 25 mg/dL — ABNORMAL HIGH (ref 8–23)
CO2: 20 mmol/L — ABNORMAL LOW (ref 22–32)
Calcium: 9.2 mg/dL (ref 8.9–10.3)
Chloride: 106 mmol/L (ref 98–111)
Creatinine, Ser: 1.26 mg/dL — ABNORMAL HIGH (ref 0.44–1.00)
GFR, Estimated: 46 mL/min — ABNORMAL LOW (ref >60)
Glucose, Bld: 288 mg/dL — ABNORMAL HIGH (ref 70–99)
Potassium: 5.1 mmol/L (ref 3.5–5.1)
Sodium: 137 mmol/L (ref 135–145)
Total Bilirubin: 0.4 mg/dL (ref 0.3–1.2)
Total Protein: 7.2 g/dL (ref 6.5–8.1)

## 2021-09-03 LAB — CBG MONITORING, ED
Glucose-Capillary: 297 mg/dL — ABNORMAL HIGH (ref 70–99)
Glucose-Capillary: 311 mg/dL — ABNORMAL HIGH (ref 70–99)

## 2021-09-03 LAB — TROPONIN I (HIGH SENSITIVITY)
Troponin I (High Sensitivity): 380 ng/L (ref <18)
Troponin I (High Sensitivity): 505 ng/L (ref <18)
Troponin I (High Sensitivity): 387 ng/L (ref <18)

## 2021-09-03 LAB — HEMOGLOBIN A1C
Hgb A1c MFr Bld: 9.3 % — ABNORMAL HIGH (ref 4.8–5.6)
Mean Plasma Glucose: 220.21 mg/dL

## 2021-09-03 LAB — GLUCOSE, CAPILLARY: Glucose-Capillary: 244 mg/dL — ABNORMAL HIGH (ref 70–99)

## 2021-09-03 MED ORDER — INSULIN GLARGINE-YFGN 100 UNIT/ML ~~LOC~~ SOLN
20.0000 [IU] | Freq: Every day | SUBCUTANEOUS | Status: DC
  Administered 2021-09-04 – 2021-09-05 (×2): 20 [IU] via SUBCUTANEOUS
  Filled 2021-09-03 (×2): qty 0.2

## 2021-09-03 MED ORDER — ASPIRIN 325 MG PO TABS
325.0000 mg | ORAL_TABLET | Freq: Once | ORAL | Status: AC
Start: 2021-09-03 — End: 2021-09-03
  Administered 2021-09-03: 325 mg via ORAL
  Filled 2021-09-03 (×2): qty 1

## 2021-09-03 MED ORDER — INSULIN GLARGINE-YFGN 100 UNIT/ML ~~LOC~~ SOLN
10.0000 [IU] | Freq: Every day | SUBCUTANEOUS | Status: DC
  Administered 2021-09-03: 10 [IU] via SUBCUTANEOUS
  Filled 2021-09-03: qty 0.1

## 2021-09-03 MED ORDER — TIOTROPIUM BROMIDE MONOHYDRATE 18 MCG IN CAPS: 18.0000 ug | ORAL_CAPSULE | Freq: Every day | RESPIRATORY_TRACT | Status: DC

## 2021-09-03 MED ORDER — METFORMIN HCL ER 500 MG PO TB24
500.0000 mg | ORAL_TABLET | Freq: Every day | ORAL | Status: DC
  Administered 2021-09-04 – 2021-09-05 (×2): 500 mg via ORAL
  Filled 2021-09-03 (×2): qty 1

## 2021-09-03 MED ORDER — DIPHENHYDRAMINE HCL 25 MG PO CAPS
50.0000 mg | ORAL_CAPSULE | Freq: Once | ORAL | Status: AC
Start: 2021-09-03 — End: 2021-09-03
  Administered 2021-09-03: 50 mg via ORAL
  Filled 2021-09-03: qty 2

## 2021-09-03 MED ORDER — SIMETHICONE 80 MG PO CHEW
80.0000 mg | CHEWABLE_TABLET | Freq: Four times a day (QID) | ORAL | Status: DC | PRN
  Filled 2021-09-03 (×2): qty 1

## 2021-09-03 MED ORDER — CARVEDILOL 3.125 MG PO TABS: 3.1250 mg | ORAL_TABLET | Freq: Two times a day (BID) | ORAL | Status: DC

## 2021-09-03 MED ORDER — BENZONATATE 100 MG PO CAPS
100.0000 mg | ORAL_CAPSULE | Freq: Three times a day (TID) | ORAL | Status: DC | PRN
  Administered 2021-09-03 – 2021-09-05 (×3): 100 mg via ORAL
  Filled 2021-09-03 (×4): qty 1

## 2021-09-03 MED ORDER — ATORVASTATIN CALCIUM 20 MG PO TABS
40.0000 mg | ORAL_TABLET | Freq: Every evening | ORAL | Status: DC
  Administered 2021-09-03 – 2021-09-04 (×2): 40 mg via ORAL
  Filled 2021-09-03 (×2): qty 2

## 2021-09-03 MED ORDER — AMLODIPINE BESYLATE 5 MG PO TABS
5.0000 mg | ORAL_TABLET | Freq: Every day | ORAL | Status: DC
  Administered 2021-09-03 – 2021-09-05 (×3): 5 mg via ORAL
  Filled 2021-09-03 (×3): qty 1

## 2021-09-03 NOTE — ED Notes (Signed)
Walked pt to the bathroom. Pt tolerated it well. Pt had a small BM. Pt is back in bed with no other needs at this time. Call bell is within reach.

## 2021-09-03 NOTE — ED Notes (Signed)
Received pt ao x 4, NAD. Pt reports feeling better today, denies any complaints at this time. Breakfast tray given, pt repositioned on the bed by herself.

## 2021-09-03 NOTE — ED Notes (Signed)
Daughter Porfirio Mylar called for an update.

## 2021-09-03 NOTE — ED Notes (Signed)
Patient tolerating O2 via nasal cannula with no increase in work of breathing and patient reports feeling better.  MD notified.

## 2021-09-03 NOTE — Progress Notes (Signed)
PROGRESS NOTE   Katherine Carter  GUR:[id number] DOB: [birth year] DOA: 09/02/2021 PCP: Emogene Morgan, MD  Brief Narrative:   70 year old community dwelling black female Known severe COPD stage III with emphysema ex-smoker follows with with [facility] clinic Dr. Simmie Davies been on oxygen for 3 years DM TY 2 Prior adenomatous polyps HTN   Prior admissions for acute respiratory failure  Presented to [facility] ED when she was cleaning up her Christmas decorations and became short of breath found to have diffuse wheezing with tight chest x-ray showed nothing acute patient placed on BiPAP BNP was 240 troponin unremarkable  Hospital-Problem based course  Acute respiratory failure Now mostly resolved and weaned off of BiPAP-keep on oxygen 2 to 3 L to maintain sats above 88% Continue Pulmicort twice daily, Atrovent every 4 and albuterol nebs Continue Solu-Medrol 40 every 12 Continue azithromycin 500 p.o. daily Elevated troponin No chest pain however troponin in the 300 range EKG personally reviewed PR interval 0.08 QRS axis -30 no ST-T wave changes across precordium Troponin to be repeated and we will get echo if it is still elevated and consult cardiology Because she does not have any active chest pain feel we can give aspirin 325 and just reassess ?  CHF We will reevaluate with echo DM TY 2 Expect sugars to be uncontrolled because giving steroids Increased long-acting insulin to 20 units daily continue sliding scale at this time Resume metformin 500 XL daily HTN HCTZ held 12.5-continue amlodipine 5 Resume losartan 100 in am if creat better Adding low-dose selective beta-blocker because of elevated troponin can resume other meds Continue as needed hydralazine AKI on admission Creatinine 1.5-meds of as above    DVT prophylaxis: Lovenox Code Status: Presumed full Family Communication: No family at the bedside Disposition:  Status is: Inpatient  Remains inpatient  appropriate because: Elevated troponin shortness of breath       Consultants:    Procedures:   Antimicrobials: Azithromycin   Subjective: Awake coherent absolutely no chest pain no swelling no arm pain Shortness of breath is better Asking about medication for cough No fever no chills Has never had cardiac cath  Objective: Vitals:   09/03/21 0530 09/03/21 0600 09/03/21 0630 09/03/21 0830  BP: 112/65 118/71 118/72 (!) 149/91  Pulse: 72 78 70 92  Resp: 16 16 17  (!) 24  Temp:    97.9 F (36.6 C)  TempSrc:      SpO2: 97% 95% 97% 95%  Weight:      Height:       No intake or output data in the 24 hours ending 09/03/21 0924 Filed Weights   09/02/21 2114  Weight: 68 kg    Examination:  Coherent pleasant no distress EOMI NCAT no focal deficit mild wheeze posterolaterally S1-S2 no murmur Abdomen soft obese nontender no rebound ROM intact Neuro intact Psych euthymic coherent  Data Reviewed: personally reviewed   CBC    Component Value Date/Time   WBC 6.2 09/03/2021 0800   RBC 4.23 09/03/2021 0800   HGB 12.2 09/03/2021 0800   HGB 15.4 05/31/2014 0626   HCT 38.7 09/03/2021 0800   HCT 48.5 (H) 05/31/2014 0626   PLT 238 09/03/2021 0800   PLT 215 05/31/2014 0626   MCV 91.5 09/03/2021 0800   MCV 93 05/31/2014 0626   MCH 28.8 09/03/2021 0800   MCHC 31.5 09/03/2021 0800   RDW 14.5 09/03/2021 0800   RDW 14.8 (H) 05/31/2014 0626   LYMPHSABS 0.6 (L) 09/03/2021 0800   LYMPHSABS  1.0 05/31/2014 0626   MONOABS 0.1 09/03/2021 0800   MONOABS 0.3 05/31/2014 0626   EOSABS 0.0 09/03/2021 0800   EOSABS 0.0 05/31/2014 0626   BASOSABS 0.0 09/03/2021 0800   BASOSABS 0.0 05/31/2014 0626   CMP Latest Ref Rng & Units 09/03/2021 09/02/2021 07/11/2018  Glucose 70 - 99 mg/dL 288(H) 240(H) 165(H)  BUN 8 - 23 mg/dL 25(H) 25(H) 26(H)  Creatinine 0.44 - 1.00 mg/dL 1.26(H) 1.54(H) 0.46  Sodium 135 - 145 mmol/L 137 138 142  Potassium 3.5 - 5.1 mmol/L 5.1 4.6 4.4  Chloride 98 - 111  mmol/L 106 106 103  CO2 22 - 32 mmol/L 20(L) 26 31  Calcium 8.9 - 10.3 mg/dL 9.2 9.7 9.3  Total Protein 6.5 - 8.1 g/dL 7.2 7.9 -  Total Bilirubin 0.3 - 1.2 mg/dL 0.4 0.6 -  Alkaline Phos 38 - 126 U/L 75 89 -  AST 15 - 41 U/L 27 25 -  ALT 0 - 44 U/L 23 23 -     Radiology Studies: DG Chest Portable 1 View  Result Date: 09/02/2021 CLINICAL DATA:  COPD exacerbation EXAM: PORTABLE CHEST 1 VIEW COMPARISON:  07/08/2018 FINDINGS: Mild bibasilar atelectasis. 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 No pneumothorax or pleural effusion. Cardiac size within normal limits. Pulmonary vascularity is otherwise normal. No acute bone abnormality. IMPRESSION: No active disease.  Emphysema. Electronically Signed   By: Fidela Salisbury M.D.   On: 09/02/2021 21:41     Scheduled Meds:  acetaminophen  650 mg Oral Once   albuterol  2.5 mg Nebulization Q4H   aspirin EC  81 mg Oral Daily   azithromycin  500 mg Oral Q24H   budesonide (PULMICORT) nebulizer solution  0.25 mg Nebulization BID   enoxaparin (LOVENOX) injection  40 mg Subcutaneous Q24H   insulin aspart  0-9 Units Subcutaneous TID WC   insulin glargine-yfgn  10 Units Subcutaneous Daily   ipratropium  0.5 mg Nebulization Q4H   losartan  100 mg Oral Daily   methylPREDNISolone (SOLU-MEDROL) injection  40 mg Intravenous Q12H   pantoprazole  40 mg Oral Daily   Continuous Infusions:   LOS: 1 day   Time spent: 35  Nita Sells, MD [facility] To contact the attending provider between 7A-7P or the covering provider during after hours 7P-7A, please log into the web site [email or web address] and access using universal [place] password for that web site. If you do not have the password, please call the hospital operator.  09/03/2021, 9:24 AM

## 2021-09-03 NOTE — Progress Notes (Signed)
Cardiology Consultation:   Patient ID: Rajvi Armentor MRN: [id number]; DOB: September 11, 1950  Admit date: 09/02/2021 Date of Consult: 09/03/2021  PCP:  Emogene Morgan, MD   [facility] Providers Cardiologist:  None        Patient Profile:   Tylesha Gibeault is a 70 y.o. female with a hx of hypertension, diabetes, COPD, former smoker x20+ years who is being seen 09/03/2021 for the evaluation of elevated troponins at the request of Dr. Mahala Menghini.  History of Present Illness:   Ms. Howden is a 70 year old female with history of hypertension, diabetes, hyperlipidemia, COPD on 1L home O2, former smoker x20+ years presenting with acute shortness of breath.  Patient was at home yesterday taking off her Christmas decorations when she suddenly became short of breath and diaphoretic.  She told her husband who called EMS and patient was brought to the hospital.  Upon admission in the ED, patient was noted to be in respiratory distress, blood gas showed respiratory acidosis.  She was placed on BiPAP and given inhalers with improvement in symptoms.  She is currently on nasal oxygen, states her breathing is much better.  Denies chest pain, denies any history of heart disease.  She takes a cholesterol medication unsure of the name.  While in the ED, initial troponins were 350, repeat 505.  Patient denies chest pain.  EKG with no acute ischemic changes.    Echocardiogram was ordered, currently pending.   Past Medical History:  Diagnosis Date   COPD (chronic obstructive pulmonary disease) (HCC)    Diabetes mellitus without complication (HCC)    GERD (gastroesophageal reflux disease)    Hypertension     Past Surgical History:  Procedure Laterality Date   ABDOMINAL HYSTERECTOMY     COLONOSCOPY WITH PROPOFOL N/A 01/23/2017   Procedure: COLONOSCOPY WITH PROPOFOL;  Surgeon: Christena Deem, MD;  Location[facility]COPY;  Service: Endoscopy;  Laterality: N/A;   FOOT SURGERY        Home Medications:  Prior to Admission medications   Medication Sig Start Date End Date Taking? Authorizing Provider  acetaminophen (TYLENOL) 325 MG tablet Take 2 tablets (650 mg total) by mouth every 6 (six) hours as needed for mild pain (or Fever >/= 101). 12/13/15  Yes Gouru, Deanna Artis, MD  aspirin EC 81 MG tablet Take 81 mg by mouth daily.   Yes [provider]  budesonide-formoterol (SYMBICORT) 160-4.5 MCG/ACT inhaler Inhale 2 puffs into the lungs 2 (two) times daily.   Yes [provider]  levalbuterol (XOPENEX HFA) 45 MCG/ACT inhaler Inhale 2 puffs into the lungs every 4 (four) hours as needed for wheezing.   Yes [provider]  metFORMIN (GLUCOPHAGE-XR) 500 MG 24 hr tablet Take 500 mg by mouth daily with breakfast.   Yes [provider]  pantoprazole (PROTONIX) 40 MG tablet Take 1 tablet (40 mg total) by mouth daily. 06/11/21  Yes Cook, Jayce G, DO  predniSONE (STERAPRED UNI-PAK 21 TAB) 10 MG (21) TBPK tablet Take by mouth daily. Take 6 tabs by mouth daily  for 2 days, then 5 tabs for 2 days, then 4 tabs for 2 days, then 3 tabs for 2 days, 2 tabs for 2 days, then 1 tab by mouth daily for 2 days 07/23/21  Yes Maple Mirza L, NP  amLODipine (NORVASC) 5 MG tablet Take 1 tablet (5 mg total) by mouth daily. Patient not taking: Reported on 09/02/2021 07/12/18   Delfino Lovett, MD  benzonatate (TESSALON) 100 MG capsule Take  100 mg by mouth 3 (three) times daily as needed for cough. Patient not taking: Reported on 09/02/2021    [provider]  BREO ELLIPTA 100-25 MCG/INH AEPB Inhale 1 puff into the lungs daily. Patient not taking: Reported on 09/02/2021 12/20/20   [provider]  diclofenac (VOLTAREN) 75 MG EC tablet Take 75 mg by mouth 2 (two) times daily as needed. Patient not taking: Reported on 09/02/2021    [provider]  diphenhydrAMINE (BENADRYL) 25 MG tablet Take 1 to 2 tablets every 4-6 hours as needed for  swelling/allergy Patient not taking: Reported on 09/02/2021 02/13/21   Shirlee Latch, PA-C  EPINEPHrine 0.3 mg/0.3 mL IJ SOAJ injection Inject 0.3 mg into the muscle as needed for anaphylaxis (swelling of lips, tongue, throat, swallowing issue , breathing problem. Use and then call EMS). Patient not taking: Reported on 09/02/2021 02/13/21 02/13/22  Eusebio Friendly B, PA-C  fluticasone [facility]) 50 MCG/ACT nasal spray Place 1 spray into both nostrils 2 (two) times daily. Patient not taking: Reported on 09/02/2021 07/07/18   [provider]  hydrochlorothiazide (MICROZIDE) 12.5 MG capsule Take 12.5 mg by mouth daily. Patient not taking: Reported on 09/02/2021 03/29/21   [provider]  ipratropium-albuterol (DUONEB) 0.5-2.5 (3) MG/3ML SOLN Take 3 mLs by nebulization every 4 (four) hours as needed. Patient not taking: Reported on 09/02/2021 02/14/17   Irean Hong, MD  latanoprost (XALATAN) 0.005 % ophthalmic solution latanoprost 0.005 % eye drops Patient not taking: Reported on 09/02/2021    [provider]  losartan (COZAAR) 100 MG tablet losartan 100 mg tablet Patient not taking: Reported on 09/02/2021 04/16/20   [provider]  polyethylene glycol powder (GLYCOLAX/MIRALAX) 17 GM/SCOOP powder 17 g once or twice daily for constipation. Patient not taking: Reported on 09/02/2021 06/11/21   Tommie Sams, DO  tiotropium (SPIRIVA HANDIHALER) 18 MCG inhalation capsule Place 1 capsule (18 mcg total) into inhaler and inhale daily. Patient not taking: Reported on 09/02/2021 12/13/15   Ramonita Lab, MD    Inpatient Medications: Scheduled Meds:  acetaminophen  650 mg Oral Once   albuterol  2.5 mg Nebulization Q4H   aspirin EC  81 mg Oral Daily   azithromycin  500 mg Oral Q24H   budesonide (PULMICORT) nebulizer solution  0.25 mg Nebulization BID   carvedilol  3.125 mg Oral BID WC   enoxaparin (LOVENOX) injection  40 mg Subcutaneous Q24H   insulin aspart  0-9 Units  Subcutaneous TID WC   [START ON 09/04/2021] insulin glargine-yfgn  20 Units Subcutaneous Daily   ipratropium  0.5 mg Nebulization Q4H   losartan  100 mg Oral Daily   [START ON 09/04/2021] metFORMIN  500 mg Oral Q breakfast   methylPREDNISolone (SOLU-MEDROL) injection  40 mg Intravenous Q12H   pantoprazole  40 mg Oral Daily   tiotropium  18 mcg Inhalation Daily   Continuous Infusions:  PRN Meds: acetaminophen **OR** acetaminophen, albuterol, benzonatate, hydrALAZINE  Allergies:    Allergies  Allergen Reactions   Penicillins Hives and Other (See Comments)    Has patient had a PCN reaction causing immediate rash, facial/tongue/throat swelling, SOB or lightheadedness with hypotension: No Has patient had a PCN reaction causing severe rash involving mucus membranes or skin necrosis: No Has patient had a PCN reaction that required hospitalization: No Has patient had a PCN reaction occurring within the last 10 years: No If all of the above answers are "NO", then may proceed with Cephalosporin use.    Social  History:   Social History   Socioeconomic History   Marital status: Married    Spouse name: Not on file   Number of children: Not on file   Years of education: Not on file   Highest education level: Not on file  Occupational History   Not on file  Tobacco Use   Smoking status: Former    Packs/day: 0.50    Years: 25.00    Pack years: 12.50    Types: Cigarettes    Quit date: 07/06/2014    Years since quitting: 7.1   Smokeless tobacco: Never  Vaping Use   Vaping Use: Never used  Substance and Sexual Activity   Alcohol use: No    Alcohol/week: 0.0 standard drinks   Drug use: No   Sexual activity: Never  Other Topics Concern   Not on file  Social History Narrative   Not on file   Social Determinants of Health   Financial Resource Strain: Not on file  Food Insecurity: Not on file  Transportation Needs: Not on file  Physical Activity: Not on file  Stress: Not on file   Social Connections: Not on file  Intimate Partner Violence: Not on file    Family History:    Family History  Problem Relation Age of Onset   CAD Mother    CAD Father    Breast cancer Neg Hx      ROS:  Please see the history of present illness.   All other ROS reviewed and negative.     Physical Exam/Data:   Vitals:   09/03/21 0630 09/03/21 0830 09/03/21 1000 09/03/21 1300  BP: 118/72 (!) 149/91 131/84 (!) 150/86  Pulse: 70 92 77 96  Resp: 17 (!) 24 17 (!) 22  Temp:  97.9 F (36.6 C)    TempSrc:      SpO2: 97% 95% 97% 95%  Weight:      Height:       No intake or output data in the 24 hours ending 09/03/21 1453 Last 3 Weights 09/02/2021 07/23/2021 06/11/2021  Weight (lbs) 150 lb 170 lb 170 lb  Weight (kg) 68.04 kg 77.111 kg 77.111 kg     Body mass index is 27.44 kg/m.  General:  Well nourished, well developed, in no acute distress HEENT: normal Neck: no JVD Vascular: No carotid bruits; Distal pulses 2+ bilaterally Cardiac: Tachycardic, regular, no murmurs Lungs: Diminished breath sounds, bilateral expiratory wheezing noted Abd: soft, nontender, no hepatomegaly  Ext: no edema Musculoskeletal:  No deformities, BUE and BLE strength normal and equal Skin: warm and dry  Neuro:  CNs 2-12 intact, no focal abnormalities noted Psych:  Normal affect   EKG:  The EKG was personally reviewed and demonstrates: Normal sinus rhythm Telemetry:  Telemetry was personally reviewed and demonstrates: Sinus rhythm  Relevant CV Studies: Echocardiogram pending  Laboratory Data:  High Sensitivity Troponin:   Recent Labs  Lab 09/02/21 2109 09/03/21 0735 09/03/21 1047  TROPONINIHS 14 380* 505*     Chemistry Recent Labs  Lab 09/02/21 2109 09/03/21 0800  NA 138 137  K 4.6 5.1  CL 106 106  CO2 26 20*  GLUCOSE 240* 288*  BUN 25* 25*  CREATININE 1.54* 1.26*  CALCIUM 9.7 9.2  MG 3.4*  --   GFRNONAA 36* 46*  ANIONGAP 6 11    Recent Labs  Lab 09/02/21 2109  09/03/21 0800  PROT 7.9 7.2  ALBUMIN 4.1 3.7  AST 25 27  ALT 23 23  ALKPHOS  89 75  BILITOT 0.6 0.4   Lipids  Recent Labs  Lab 09/03/21 1047  CHOL 198  TRIG 79  HDL 66  LDLCALC 116*  CHOLHDL 3.0    Hematology Recent Labs  Lab 09/02/21 2109 09/03/21 0800  WBC 10.5 6.2  RBC 4.65 4.23  HGB 13.5 12.2  HCT 43.0 38.7  MCV 92.5 91.5  MCH 29.0 28.8  MCHC 31.4 31.5  RDW 14.4 14.5  PLT 256 238   Thyroid No results for input(s): TSH, FREET4 in the last 168 hours.  BNP Recent Labs  Lab 09/02/21 2109  BNP 14.2    DDimer No results for input(s): DDIMER in the last 168 hours.   Radiology/Studies:  DG Chest Portable 1 View  Result Date: 09/02/2021 CLINICAL DATA:  COPD exacerbation EXAM: PORTABLE CHEST 1 VIEW COMPARISON:  07/08/2018 FINDINGS: Mild bibasilar atelectasis. 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 No pneumothorax or pleural effusion. Cardiac size within normal limits. Pulmonary vascularity is otherwise normal. No acute bone abnormality. IMPRESSION: No active disease.  Emphysema. Electronically Signed   By: Helyn Numbers M.D.   On: 09/02/2021 21:41     Assessment and Plan:   Elevated troponins with underlying CAD -Troponins 380--->505 -Likely represents demand supply mismatch with underlying CAD. -Continue to trend troponins until peaked -Prior chest CT showed coronary artery calcifications. -Aspirin 81 mg, Lipitor 40 mg daily -get echocardiogram -If echo shows no gross structural abnormalities.  outpatient stress test can be considered.  2.  Hypertension -Restart PTA Norvasc, losartan.  3.  Hyperlipidemia, goal LDL less than 70 -Start Lipitor 40 mg daily  4.  COPD exacerbation -Management as per primary team     Signed, Debbe Odea, MD  09/03/2021 2:53 PM

## 2021-09-04 ENCOUNTER — Inpatient Hospital Stay (HOSPITAL_COMMUNITY)
Admit: 2021-09-04 | Discharge: 2021-09-04 | Disposition: A | Discharge status: Home / Self Care | Payer: Medicare Other | Attending: Family Medicine | Admitting: Family Medicine

## 2021-09-04 DIAGNOSIS — R778 Other specified abnormalities of plasma proteins: Secondary | ICD-10-CM | POA: Diagnosis not present

## 2021-09-04 DIAGNOSIS — J441 Chronic obstructive pulmonary disease with (acute) exacerbation: Secondary | ICD-10-CM | POA: Diagnosis not present

## 2021-09-04 DIAGNOSIS — R0602 Shortness of breath: Secondary | ICD-10-CM | POA: Diagnosis not present

## 2021-09-04 LAB — ECHOCARDIOGRAM COMPLETE
AR max vel: 2.22 cm2
AV Area VTI: 2.79 cm2
AV Area mean vel: 2.31 cm2
AV Mean grad: 8 mmHg
AV Peak grad: 15.7 mmHg
Ao pk vel: 1.98 m/s
Area-P 1/2: 3.87 cm2
Calc EF: 58 %
Height: 62 in
S' Lateral: 2.5 cm
Single Plane A2C EF: 50.7 %
Single Plane A4C EF: 67.6 %
Weight: 2694.9 oz

## 2021-09-04 LAB — COMPREHENSIVE METABOLIC PANEL
ALT: 22 U/L (ref 0–44)
AST: 28 U/L (ref 15–41)
Albumin: 3.7 g/dL (ref 3.5–5.0)
Alkaline Phosphatase: 69 U/L (ref 38–126)
Anion gap: 11 (ref 5–15)
BUN: 34 mg/dL — ABNORMAL HIGH (ref 8–23)
CO2: 20 mmol/L — ABNORMAL LOW (ref 22–32)
Calcium: 9.9 mg/dL (ref 8.9–10.3)
Chloride: 104 mmol/L (ref 98–111)
Creatinine, Ser: 1.19 mg/dL — ABNORMAL HIGH (ref 0.44–1.00)
GFR, Estimated: 49 mL/min — ABNORMAL LOW (ref >60)
Glucose, Bld: 202 mg/dL — ABNORMAL HIGH (ref 70–99)
Potassium: 5 mmol/L (ref 3.5–5.1)
Sodium: 135 mmol/L (ref 135–145)
Total Bilirubin: 0.6 mg/dL (ref 0.3–1.2)
Total Protein: 7.2 g/dL (ref 6.5–8.1)

## 2021-09-04 LAB — CBC WITH DIFFERENTIAL/PLATELET
Abs Immature Granulocytes: 0.08 10*3/uL — ABNORMAL HIGH (ref 0.00–0.07)
Basophils Absolute: 0 10*3/uL (ref 0.0–0.1)
Basophils Relative: 0 %
Eosinophils Absolute: 0 10*3/uL (ref 0.0–0.5)
Eosinophils Relative: 0 %
HCT: 39.9 % (ref 36.0–46.0)
Hemoglobin: 12.6 g/dL (ref 12.0–15.0)
Immature Granulocytes: 1 %
Lymphocytes Relative: 12 %
Lymphs Abs: 1.6 10*3/uL (ref 0.7–4.0)
MCH: 28.8 pg (ref 26.0–34.0)
MCHC: 31.6 g/dL (ref 30.0–36.0)
MCV: 91.1 fL (ref 80.0–100.0)
Monocytes Absolute: 0.8 10*3/uL (ref 0.1–1.0)
Monocytes Relative: 6 %
Neutro Abs: 11.5 10*3/uL — ABNORMAL HIGH (ref 1.7–7.7)
Neutrophils Relative %: 81 %
Platelets: 251 10*3/uL (ref 150–400)
RBC: 4.38 MIL/uL (ref 3.87–5.11)
RDW: 14.6 % (ref 11.5–15.5)
WBC: 14 10*3/uL — ABNORMAL HIGH (ref 4.0–10.5)
nRBC: 0 % (ref 0.0–0.2)

## 2021-09-04 LAB — HIV ANTIBODY (ROUTINE TESTING W REFLEX): HIV Screen 4th Generation wRfx: NONREACTIVE

## 2021-09-04 LAB — GLUCOSE, CAPILLARY
Glucose-Capillary: 210 mg/dL — ABNORMAL HIGH (ref 70–99)
Glucose-Capillary: 242 mg/dL — ABNORMAL HIGH (ref 70–99)
Glucose-Capillary: 190 mg/dL — ABNORMAL HIGH (ref 70–99)
Glucose-Capillary: 284 mg/dL — ABNORMAL HIGH (ref 70–99)

## 2021-09-04 LAB — TROPONIN I (HIGH SENSITIVITY): Troponin I (High Sensitivity): 281 ng/L (ref <18)

## 2021-09-04 LAB — BRAIN NATRIURETIC PEPTIDE: B Natriuretic Peptide: 168.1 pg/mL — ABNORMAL HIGH (ref 0.0–100.0)

## 2021-09-04 MED ORDER — IPRATROPIUM-ALBUTEROL 0.5-2.5 (3) MG/3ML IN SOLN
3.0000 mL | RESPIRATORY_TRACT | Status: DC
  Administered 2021-09-04: 3 mL via RESPIRATORY_TRACT
  Filled 2021-09-04 (×2): qty 3

## 2021-09-04 MED ORDER — BISACODYL 5 MG PO TBEC
5.0000 mg | DELAYED_RELEASE_TABLET | Freq: Every day | ORAL | Status: DC | PRN
  Administered 2021-09-04: 5 mg via ORAL
  Filled 2021-09-04: qty 1

## 2021-09-04 MED ORDER — IPRATROPIUM-ALBUTEROL 0.5-2.5 (3) MG/3ML IN SOLN
3.0000 mL | Freq: Four times a day (QID) | RESPIRATORY_TRACT | Status: DC
  Administered 2021-09-04 – 2021-09-05 (×3): 3 mL via RESPIRATORY_TRACT
  Filled 2021-09-04 (×3): qty 3

## 2021-09-04 NOTE — Progress Notes (Signed)
*  PRELIMINARY RESULTS* Echocardiogram 2D Echocardiogram has been performed.  Katherine Carter 09/04/2021, 10:59 AM

## 2021-09-04 NOTE — Progress Notes (Signed)
PROGRESS NOTE    Katherine Carter  WGN:[id number] DOB: February 25, 1951 DOA: 09/02/2021 PCP: Emogene MorganAycock, Ngwe A, MD    Chief Complaint  Patient presents with   Respiratory Distress    Brief Narrative:  71 year old lady with prior history of hypertension diabetes, COPD presents with shortness of breath.  She was admitted for acute respiratory failure with hypoxia probably secondary to COPD exacerbation she initially required BiPAP but is currently on nasal cannula oxygen with improvement in breathing and coughing.  Patient at home is on 1 L of nasal cannula oxygen currently requiring up to 2 L/min Patient was also found to have elevated troponins on blood work on admission Patient denies any chest pain at this time. Assessment & Plan:   Principal Problem:   Acute respiratory failure with hypoxemia (HCC) Active Problems:   Diabetes (HCC)   HTN (hypertension)   COPD exacerbation (HCC)  Acute respiratory failure with hypoxia probably secondary to COPD exacerbation Patient's breathing and wheezing has much improved Possible transition to oral steroid with a quick taper in the next few days. Cardiology consulted for elevated troponins deemed to be secondary to demand supply mismatch with underlying coronary artery disease.  Plan for echocardiogram.  If the echo shows no gross structural abnormalities plan for outpatient stress test. Meanwhile continue with duo nebs, nasal cannula oxygen to keep sats greater than 90%.    Essential hypertension Blood pressure parameters appear to be optimal Resume home medications.    Hyperlipidemia continue with Lipitor 40 mg daily.   Insulin-dependent diabetes mellitus Continue with Semglee 20 units daily, Uncontrolled with hyperglycemia secondary to steroids. Continue with sliding scale insulin.    DVT prophylaxis: (Lovenox) Code Status: full code Family Communication: none at bedside.  Disposition:   Status is: Inpatient  Remains  inpatient appropriate because: IV steroids.       Consultants:  Cardiology.   Procedures: Echocardiogram.   Antimicrobials: none  Subjective: Breathing has improved.  Objective: Vitals:   09/04/21 0051 09/04/21 0446 09/04/21 0755 09/04/21 0758  BP:  131/70  105/77  Pulse:  84  92  Resp:  14    Temp:  98.3 F (36.8 C)  (!) 97.5 F (36.4 C)  TempSrc:  Oral    SpO2: 95% 100% 98% 95%  Weight:      Height:       No intake or output data in the 24 hours ending 09/04/21 0906 Filed Weights   09/02/21 2114 09/03/21 2225  Weight: 68 kg 76.4 kg    Examination:  General exam: Appears calm and comfortable  Respiratory system: Clear to auscultation. Respiratory effort normal. Cardiovascular system: S1 & S2 heard, RRR. No JVD,  No pedal edema. Gastrointestinal system: Abdomen is nondistended, soft and nontender.. Normal bowel sounds heard. Central nervous system: Alert and oriented. No focal neurological deficits. Extremities: Symmetric 5 x 5 power. Skin: No rashes, lesions or ulcers Psychiatry:  Mood & affect appropriate.     Data Reviewed: I have personally reviewed following labs and imaging studies  CBC: Recent Labs  Lab 09/02/21 2109 09/03/21 0800 09/04/21 0635  WBC 10.5 6.2 14.0*  NEUTROABS 4.3 5.5 11.5*  HGB 13.5 12.2 12.6  HCT 43.0 38.7 39.9  MCV 92.5 91.5 91.1  PLT 256 238 251    Basic Metabolic Panel: Recent Labs  Lab 09/02/21 2109 09/03/21 0800 09/04/21 0635  NA 138 137 135  K 4.6 5.1 5.0  CL 106 106 104  CO2 26 20* 20*  GLUCOSE 240* 288*  202*  BUN 25* 25* 34*  CREATININE 1.54* 1.26* 1.19*  CALCIUM 9.7 9.2 9.9  MG 3.4*  --   --     GFR: Estimated Creatinine Clearance: 42.1 mL/min (A) (by C-G formula based on SCr of 1.19 mg/dL (H)).  Liver Function Tests: Recent Labs  Lab 09/02/21 2109 09/03/21 0800 09/04/21 0635  AST 25 27 28   ALT 23 23 22   ALKPHOS 89 75 69  BILITOT 0.6 0.4 0.6  PROT 7.9 7.2 7.2  ALBUMIN 4.1 3.7 3.7     CBG: Recent Labs  Lab 09/03/21 0739 09/03/21 1202 09/03/21 2249 09/04/21 0800  GLUCAP 297* 311* 244* 210*     Recent Results (from the past 240 hour(s))  Resp Panel by RT-PCR (Flu A&B, Covid) Nasopharyngeal Swab     Status: None   Collection Time: 09/02/21  9:09 PM   Specimen: Nasopharyngeal Swab; Nasopharyngeal(NP) swabs in vial transport medium  Result Value Ref Range Status   SARS Coronavirus 2 by RT PCR NEGATIVE NEGATIVE Final    Comment: (NOTE) SARS-CoV-2 target nucleic acids are NOT DETECTED.  The SARS-CoV-2 RNA is generally detectable in upper respiratory specimens during the acute phase of infection. The lowest concentration of SARS-CoV-2 viral copies this assay can detect is 138 copies/mL. A negative result does not preclude SARS-Cov-2 infection and should not be used as the sole basis for treatment or other patient management decisions. A negative result may occur with  improper specimen collection/handling, submission of specimen other than nasopharyngeal swab, presence of viral mutation(s) within the areas targeted by this assay, and inadequate number of viral copies(<138 copies/mL). A negative result must be combined with clinical observations, patient history, and epidemiological information. The expected result is Negative.  Fact Sheet for Patients:  11/02/21  Fact Sheet for Healthcare Providers:  09/04/21  This test is no t yet approved or cleared by the [employer] FDA and  has been authorized for detection and/or diagnosis of SARS-CoV-2 by FDA under an Emergency Use Authorization (EUA). This EUA will remain  in effect (meaning this test can be used) for the duration of the COVID-19 declaration under Section 564(b)(1) of the Act, 21 U.S.C.section 360bbb-3(b)(1), unless the authorization is terminated  or revoked sooner.       Influenza A by PCR NEGATIVE NEGATIVE Final    Influenza B by PCR NEGATIVE NEGATIVE Final    Comment: (NOTE) The Xpert Xpress SARS-CoV-2/FLU/RSV plus assay is intended as an aid in the diagnosis of influenza from Nasopharyngeal swab specimens and should not be used as a sole basis for treatment. Nasal washings and aspirates are unacceptable for Xpert Xpress SARS-CoV-2/FLU/RSV testing.  Fact Sheet for Patients: [employer]it  Fact Sheet for Healthcare Providers: [place]  This test is not yet approved or cleared by the [employer] FDA and has been authorized for detection and/or diagnosis of SARS-CoV-2 by FDA under an Emergency Use Authorization (EUA). This EUA will remain in effect (meaning this test can be used) for the duration of the COVID-19 declaration under Section 564(b)(1) of the Act, 21 U.S.C. section 360bbb-3(b)(1), unless the authorization is terminated or revoked.  Performed at [facility], [address] [place], [address] [place]          Radiology Studies: DG Chest Portable 1 View  Result Date: 09/02/2021 CLINICAL DATA:  COPD exacerbation EXAM: PORTABLE CHEST 1 VIEW COMPARISON:  07/08/2018 FINDINGS: Mild bibasilar atelectasis. Paucity of vasculature within the lung apices in keeping with changes of bullous emphysema is again  noted. No pneumothorax or pleural effusion. Cardiac size within normal limits. Pulmonary vascularity is otherwise normal. No acute bone abnormality. IMPRESSION: No active disease.  Emphysema. Electronically Signed   By: Helyn Numbers M.D.   On: 09/02/2021 21:41        Scheduled Meds:  acetaminophen  650 mg Oral Once   amLODipine  5 mg Oral Daily   aspirin EC  81 mg Oral Daily   atorvastatin  40 mg Oral QPM   azithromycin  500 mg Oral Q24H   budesonide (PULMICORT) nebulizer solution  0.25 mg Nebulization BID   enoxaparin (LOVENOX) injection  40 mg Subcutaneous Q24H   insulin aspart  0-9 Units Subcutaneous  TID WC   insulin glargine-yfgn  20 Units Subcutaneous Daily   ipratropium-albuterol  3 mL Nebulization Q4H   losartan  100 mg Oral Daily   metFORMIN  500 mg Oral Q breakfast   methylPREDNISolone (SOLU-MEDROL) injection  40 mg Intravenous Q12H   pantoprazole  40 mg Oral Daily   Continuous Infusions:   LOS: 2 days       Kathlen Mody, MD [facility]   To contact the attending provider between 7A-7P or the covering provider during after hours 7P-7A, please log into the web site [email or web address] and access using universal [place] password for that web site. If you do not have the password, please call the hospital operator.  09/04/2021, 9:06 AM

## 2021-09-04 NOTE — Progress Notes (Signed)
Progress Note  Patient Name: Katherine Carter Date of Encounter: 09/04/2021  [facility] Cardiologist: new-Agbor-Etang rounding  Subjective   Shortness of breath is improved, denies chest pain.  Inpatient Medications    Scheduled Meds:  acetaminophen  650 mg Oral Once   amLODipine  5 mg Oral Daily   aspirin EC  81 mg Oral Daily   atorvastatin  40 mg Oral QPM   azithromycin  500 mg Oral Q24H   budesonide (PULMICORT) nebulizer solution  0.25 mg Nebulization BID   enoxaparin (LOVENOX) injection  40 mg Subcutaneous Q24H   insulin aspart  0-9 Units Subcutaneous TID WC   insulin glargine-yfgn  20 Units Subcutaneous Daily   ipratropium-albuterol  3 mL Nebulization Q6H   losartan  100 mg Oral Daily   metFORMIN  500 mg Oral Q breakfast   methylPREDNISolone (SOLU-MEDROL) injection  40 mg Intravenous Q12H   pantoprazole  40 mg Oral Daily   Continuous Infusions:  PRN Meds: acetaminophen **OR** acetaminophen, albuterol, benzonatate, hydrALAZINE, simethicone   Vital Signs    Vitals:   09/04/21 0446 09/04/21 0755 09/04/21 0758 09/04/21 1227  BP: 131/70  105/77 108/78  Pulse: 84  92 (!) 105  Resp: 14     Temp: 98.3 F (36.8 C)  (!) 97.5 F (36.4 C) 97.9 F (36.6 C)  TempSrc: Oral     SpO2: 100% 98% 95% 93%  Weight:      Height:        Intake/Output Summary (Last 24 hours) at 09/04/2021 1317 Last data filed at 09/04/2021 1100 Gross per 24 hour  Intake --  Output 350 ml  Net -350 ml   Last 3 Weights 09/03/2021 09/02/2021 07/23/2021  Weight (lbs) 168 lb 6.9 oz 150 lb 170 lb  Weight (kg) 76.4 kg 68.04 kg 77.111 kg      Telemetry    Sinus tachycardia, heart rate 104- Personally Reviewed  ECG     - Personally Reviewed  Physical Exam   GEN: No acute distress.   Neck: No JVD Cardiac: RRR, no murmurs, rubs, or gallops.  Respiratory: Expiratory wheezing GI: Soft, nontender, non-distended  MS: No edema; No deformity. Neuro:  Nonfocal  Psych: Normal affect    Labs    High Sensitivity Troponin:   Recent Labs  Lab 09/02/21 2109 09/03/21 0735 09/03/21 1047 09/03/21 2248  TROPONINIHS 14 380* 505* 281*   387*     Chemistry Recent Labs  Lab 09/02/21 2109 09/03/21 0800 09/04/21 0635  NA 138 137 135  K 4.6 5.1 5.0  CL 106 106 104  CO2 26 20* 20*  GLUCOSE 240* 288* 202*  BUN 25* 25* 34*  CREATININE 1.54* 1.26* 1.19*  CALCIUM 9.7 9.2 9.9  MG 3.4*  --   --   PROT 7.9 7.2 7.2  ALBUMIN 4.1 3.7 3.7  AST 25 27 28   ALT 23 23 22   ALKPHOS 89 75 69  BILITOT 0.6 0.4 0.6  GFRNONAA 36* 46* 49*  ANIONGAP 6 11 11     Lipids  Recent Labs  Lab 09/03/21 1047  CHOL 198  TRIG 79  HDL 66  LDLCALC 116*  CHOLHDL 3.0    Hematology Recent Labs  Lab 09/02/21 2109 09/03/21 0800 09/04/21 0635  WBC 10.5 6.2 14.0*  RBC 4.65 4.23 4.38  HGB 13.5 12.2 12.6  HCT 43.0 38.7 39.9  MCV 92.5 91.5 91.1  MCH 29.0 28.8 28.8  MCHC 31.4 31.5 31.6  RDW 14.4 14.5 14.6  PLT 256  238 251   Thyroid No results for input(s): TSH, FREET4 in the last 168 hours.  BNP Recent Labs  Lab 09/02/21 2109 09/04/21 0635  BNP 14.2 168.1*    DDimer No results for input(s): DDIMER in the last 168 hours.   Radiology    DG Chest Portable 1 View  Result Date: 09/02/2021 CLINICAL DATA:  COPD exacerbation EXAM: PORTABLE CHEST 1 VIEW COMPARISON:  07/08/2018 FINDINGS: Mild bibasilar atelectasis. 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 No pneumothorax or pleural effusion. Cardiac size within normal limits. Pulmonary vascularity is otherwise normal. No acute bone abnormality. IMPRESSION: No active disease.  Emphysema. Electronically Signed   By: Fidela Salisbury M.D.   On: 09/02/2021 21:41   ECHOCARDIOGRAM COMPLETE  Result Date: 09/04/2021    ECHOCARDIOGRAM REPORT   Patient Name:   Katherine Carter Date of Exam: 09/04/2021 Medical Rec #:  [id number]               Height:       62.0 in Accession #:     [id number]              Weight:       168.4 lb Date of Birth:  1951/06/22               BSA:          1.777 m Patient Age:    71 years                BP:           131/70 mmHg Patient Gender: F                       HR:           87 bpm. Exam Location:  [facility] Procedure: 2D Echo Indications:     CHF I50.9  History:         Patient has no prior history of Echocardiogram examinations.  Sonographer:     Kathlen Brunswick RDCS Referring Phys:  [place] Diagnosing Phys: Skeet Latch MD IMPRESSIONS  1. Left ventricular ejection fraction, by estimation, is 65 to 70%. The left ventricle has normal function. The left ventricle has no regional wall motion abnormalities. Left ventricular diastolic parameters are consistent with Grade I diastolic dysfunction (impaired relaxation).  2. Right ventricular systolic function is normal. The right ventricular size is normal. There is normal pulmonary artery systolic pressure.  3. The mitral valve is normal in structure. No evidence of mitral valve regurgitation. No evidence of mitral stenosis.  4. The aortic valve is tricuspid. There is mild calcification of the aortic valve. There is mild thickening of the aortic valve. Aortic valve regurgitation is not visualized. Aortic valve sclerosis is present, with no evidence of aortic valve stenosis. Aortic valve area, by VTI measures 2.79 cm. Aortic valve mean gradient measures 8.0 mmHg. Aortic valve Vmax measures 1.98 m/s.  5. The inferior vena cava is normal in size with greater than 50% respiratory variability, suggesting right atrial pressure of 3 mmHg. FINDINGS  Left Ventricle: Left ventricular ejection fraction, by estimation, is 65 to 70%. The left ventricle has normal function. The left ventricle has no regional wall motion abnormalities. The left ventricular internal cavity size was normal in size. There is  no left ventricular hypertrophy. Left ventricular diastolic parameters are consistent with Grade I diastolic  dysfunction (impaired relaxation). Indeterminate filling  pressures. Right Ventricle: The right ventricular size is normal. No increase in right ventricular wall thickness. Right ventricular systolic function is normal. There is normal pulmonary artery systolic pressure. The tricuspid regurgitant velocity is 1.72 m/s, and  with an assumed right atrial pressure of 3 mmHg, the estimated right ventricular systolic pressure is [id number] mmHg. Left Atrium: Left atrial size was normal in size. Right Atrium: Right atrial size was normal in size. Pericardium: There is no evidence of pericardial effusion. Mitral Valve: The mitral valve is normal in structure. No evidence of mitral valve regurgitation. No evidence of mitral valve stenosis. Tricuspid Valve: The tricuspid valve is normal in structure. Tricuspid valve regurgitation is trivial. No evidence of tricuspid stenosis. Aortic Valve: The aortic valve is tricuspid. There is mild calcification of the aortic valve. There is mild thickening of the aortic valve. Aortic valve regurgitation is not visualized. Aortic valve sclerosis is present, with no evidence of aortic valve stenosis. Aortic valve mean gradient measures 8.0 mmHg. Aortic valve peak gradient measures 15.7 mmHg. Aortic valve area, by VTI measures 2.79 cm. Pulmonic Valve: The pulmonic valve was normal in structure. Pulmonic valve regurgitation is not visualized. No evidence of pulmonic stenosis. Aorta: The aortic root is normal in size and structure. Venous: The inferior vena cava is normal in size with greater than 50% respiratory variability, suggesting right atrial pressure of 3 mmHg. IAS/Shunts: No atrial level shunt detected by color flow Doppler.  LEFT VENTRICLE PLAX 2D LVIDd:         3.90 cm     Diastology LVIDs:         2.50 cm     LV e' medial:    6.42 cm/s LV PW:         0.90 cm     LV E/e' medial:  10.6 LV IVS:        1.01 cm     LV e' lateral:   7.29 cm/s LVOT diam:     2.10 cm     LV E/e' lateral: 9.3 LV  SV:         88 LV SV Index:   50 LVOT Area:     3.46 cm  LV Volumes (MOD) LV vol d, MOD A2C: 42.2 ml LV vol d, MOD A4C: 47.6 ml LV vol s, MOD A2C: 20.8 ml LV vol s, MOD A4C: 15.4 ml LV SV MOD A2C:     21.4 ml LV SV MOD A4C:     47.6 ml LV SV MOD BP:      26.0 ml RIGHT VENTRICLE RV Basal diam:  2.30 cm RV S prime:     17.40 cm/s TAPSE (M-mode): 2.4 cm LEFT ATRIUM           Index        RIGHT ATRIUM           Index LA diam:      3.60 cm 2.03 cm/m   RA Area:     17.00 cm LA Vol (A2C): 15.0 ml 8.44 ml/m   RA Volume:   42.50 ml  23.92 ml/m LA Vol (A4C): 40.5 ml 22.79 ml/m  AORTIC VALVE AV Area (Vmax):    2.22 cm AV Area (Vmean):   2.31 cm AV Area (VTI):     2.79 cm AV Vmax:           198.00 cm/s AV Vmean:          131.000 cm/s AV VTI:  0.316 m AV Peak Grad:      15.7 mmHg AV Mean Grad:      8.0 mmHg LVOT Vmax:         127.00 cm/s LVOT Vmean:        87.500 cm/s LVOT VTI:          0.255 m LVOT/AV VTI ratio: 0.81  AORTA Ao Root diam: 2.60 cm MITRAL VALVE                TRICUSPID VALVE MV Area (PHT): 3.87 cm     TR Peak grad:   11.8 mmHg MV Decel Time: 196 msec     TR Vmax:        172.00 cm/s MV E velocity: 68.10 cm/s MV A velocity: 108.00 cm/s  SHUNTS MV E/A ratio:  0.63         Systemic VTI:  0.26 m                             Systemic Diam: 2.10 cm Skeet Latch MD Electronically signed by Skeet Latch MD Signature Date/Time: 09/04/2021/12:45:31 PM    Final     Cardiac Studies   TTE 09/04/2021 1. Left ventricular ejection fraction, by estimation, is 65 to 70%. The  left ventricle has normal function. The left ventricle has no regional  wall motion abnormalities. Left ventricular diastolic parameters are  consistent with Grade I diastolic  dysfunction (impaired relaxation).   2. Right ventricular systolic function is normal. The right ventricular  size is normal. There is normal pulmonary artery systolic pressure.   3. The mitral valve is normal in structure. No evidence of mitral valve   regurgitation. No evidence of mitral stenosis.   4. The aortic valve is tricuspid. There is mild calcification of the  aortic valve. There is mild thickening of the aortic valve. Aortic valve  regurgitation is not visualized. Aortic valve sclerosis is present, with  no evidence of aortic valve stenosis.  Aortic valve area, by VTI measures 2.79 cm. Aortic valve mean gradient  measures 8.0 mmHg. Aortic valve Vmax measures 1.98 m/s.   5. The inferior vena cava is normal in size with greater than 50%  respiratory variability, suggesting right atrial pressure of 3 mmHg.   Patient Profile     71 y.o. female with history of hypertension, diabetes, COPD, former smoker x20 years being seen for elevated troponins.  Admitted with COPD exacerbation.  Assessment & Plan    Elevated troponins with underlying CAD -Troponins  peaked at 505 -Likely represents demand supply mismatch with underlying CAD. Denies chest pain -Aspirin 81 mg, Lipitor 40 mg daily -Echo with preserved EF.   2.  Hypertension -BP controlled.  Continue Norvasc, losartan.   3.  Hyperlipidemia -Lipitor 40 mg daily   4.  COPD exacerbation -Management as per primary team  No additional testing planned from a cardiac perspective.  Continue management of COPD as per primary team.  Cardiology will sign off.  Patient can follow-up with cardiology as outpatient due to coronary calcification.    Total encounter time 35 minutes  Greater than 50% was spent in counseling and coordination of care with the patient     Signed, Kate Sable, MD  09/04/2021, 1:17 PM

## 2021-09-05 LAB — GLUCOSE, CAPILLARY
Glucose-Capillary: 194 mg/dL — ABNORMAL HIGH (ref 70–99)
Glucose-Capillary: 324 mg/dL — ABNORMAL HIGH (ref 70–99)

## 2021-09-05 MED ORDER — INSULIN ASPART 100 UNIT/ML IJ SOLN: INTRAMUSCULAR | 11 refills | Status: DC

## 2021-09-05 MED ORDER — AZITHROMYCIN 250 MG PO TABS: 500.0000 mg | ORAL_TABLET | Freq: Every day | ORAL | 0 refills | Status: AC

## 2021-09-05 MED ORDER — IPRATROPIUM-ALBUTEROL 0.5-2.5 (3) MG/3ML IN SOLN: 3.0000 mL | RESPIRATORY_TRACT | 3 refills | Status: AC | PRN

## 2021-09-05 MED ORDER — INSULIN GLARGINE-YFGN 100 UNIT/ML ~~LOC~~ SOLN: 20.0000 [IU] | Freq: Every day | SUBCUTANEOUS | 11 refills | Status: DC

## 2021-09-05 MED ORDER — SPIRIVA HANDIHALER 18 MCG IN CAPS: 18.0000 ug | ORAL_CAPSULE | Freq: Every day | RESPIRATORY_TRACT | 0 refills | Status: DC

## 2021-09-05 MED ORDER — BENZONATATE 100 MG PO CAPS: 100.0000 mg | ORAL_CAPSULE | Freq: Three times a day (TID) | ORAL | 0 refills | Status: DC | PRN

## 2021-09-05 MED ORDER — AMLODIPINE BESYLATE 5 MG PO TABS: 5.0000 mg | ORAL_TABLET | Freq: Every day | ORAL | 0 refills | Status: AC

## 2021-09-05 MED ORDER — PREDNISONE 10 MG (21) PO TBPK: ORAL_TABLET | ORAL | 0 refills | Status: DC

## 2021-09-05 MED ORDER — ATORVASTATIN CALCIUM 40 MG PO TABS: 40.0000 mg | ORAL_TABLET | Freq: Every evening | ORAL | 1 refills | Status: DC

## 2021-09-05 NOTE — Care Management Important Message (Signed)
Important Message  Patient Details  Name: Katherine Carter MRN: [id number] Date of Birth: 10/12/1950   Medicare Important Message Given:  Yes     Johnell Comings 09/05/2021, 11:44 AM

## 2021-09-14 NOTE — Discharge Summary (Signed)
Physician Discharge Summary  Katherine Carter [id number] DOB: May 28, 1951 DOA: 09/02/2021  PCP: Donnie Coffin, MD  Admit date: 09/02/2021 Discharge date: 09/05/2021  Admitted From: Home.  Disposition:  Home.   Recommendations for Outpatient Follow-up:  Follow up with PCP in 1-2 weeks Please obtain BMP/CBC in one week Please follow up with cardiology as recommended.   Discharge Condition:stable.  CODE STATUS:full code.  Diet recommendation: Heart Healthy / Carb Modified  Brief/Interim Summary: 71 year old lady with prior history of hypertension diabetes, COPD presents with shortness of breath.  She was admitted for acute respiratory failure with hypoxia probably secondary to COPD exacerbation she initially required BiPAP but is currently on nasal cannula oxygen with improvement in breathing and coughing.  Patient at home is on 1 L of nasal cannula oxygen currently requiring up to 2 L/min Patient was also found to have elevated troponins on blood work on admission Patient denies any chest pain at this time.   Discharge Diagnoses:  Principal Problem:   Acute respiratory failure with hypoxemia (HCC) Active Problems:   Diabetes (HCC)   HTN (hypertension)   COPD exacerbation (HCC)  Acute respiratory failure with hypoxia probably secondary to COPD exacerbation Patient's breathing and wheezing has much improved Possible transition to oral steroid with a quick taper in the next few days. Cardiology consulted for elevated troponins deemed to be secondary to demand supply mismatch with underlying coronary artery disease.  Plan for echocardiogram.  If the echo shows no gross structural abnormalities plan for outpatient stress test. Meanwhile continue with duo nebs, nasal cannula oxygen to keep sats greater than 90%. Echo with preserved EF, outpatient follow up with cardiology recommended.        Essential hypertension Blood pressure parameters appear to be optimal Resume home  medications.       Hyperlipidemia continue with Lipitor 40 mg daily.     Insulin-dependent diabetes mellitus Continue with Semglee 20 units daily, Uncontrolled with hyperglycemia secondary to steroids. Continue with sliding scale insulin.    Discharge Instructions  Discharge Instructions     Diet - low sodium heart healthy   Complete by: As directed    Discharge instructions   Complete by: As directed    Please follow up with PCP in one week.      Allergies as of 09/05/2021       Reactions   Penicillins Hives, Other (See Comments)   Has patient had a PCN reaction causing immediate rash, facial/tongue/throat swelling, SOB or lightheadedness with hypotension: No Has patient had a PCN reaction causing severe rash involving mucus membranes or skin necrosis: No Has patient had a PCN reaction that required hospitalization: No Has patient had a PCN reaction occurring within the last 10 years: No If all of the above answers are "NO", then may proceed with Cephalosporin use.        Medication List     STOP taking these medications    Breo Ellipta 100-25 MCG/ACT Aepb Generic drug: fluticasone furoate-vilanterol   diclofenac 75 MG EC tablet Commonly known as: VOLTAREN   diphenhydrAMINE 25 MG tablet Commonly known as: BENADRYL   EPINEPHrine 0.3 mg/0.3 mL Soaj injection Commonly known as: EPI-PEN   fluticasone 50 MCG/ACT nasal spray Commonly known as: FLONASE   hydrochlorothiazide 12.5 MG capsule Commonly known as: MICROZIDE   latanoprost 0.005 % ophthalmic solution Commonly known as: XALATAN   levalbuterol 45 MCG/ACT inhaler Commonly known as: XOPENEX HFA   polyethylene glycol powder 17 GM/SCOOP powder Commonly known as:  GLYCOLAX/MIRALAX       TAKE these medications    acetaminophen 325 MG tablet Commonly known as: TYLENOL Take 2 tablets (650 mg total) by mouth every 6 (six) hours as needed for mild pain (or Fever >/= 101).   amLODipine 5 MG  tablet Commonly known as: NORVASC Take 1 tablet (5 mg total) by mouth daily.   aspirin EC 81 MG tablet Take 81 mg by mouth daily.   atorvastatin 40 MG tablet Commonly known as: LIPITOR Take 1 tablet (40 mg total) by mouth every evening.   benzonatate 100 MG capsule Commonly known as: TESSALON Take 1 capsule (100 mg total) by mouth 3 (three) times daily as needed for cough.   budesonide-formoterol 160-4.5 MCG/ACT inhaler Commonly known as: SYMBICORT Inhale 2 puffs into the lungs 2 (two) times daily.   insulin aspart 100 UNIT/ML injection Commonly known as: novoLOG CBG 70 - 120: 0 units  CBG 121 - 150: 1 unit  CBG 151 - 200: 2 units  CBG 201 - 250: 3 units  CBG 251 - 300: 5 units  CBG 301 - 350: 7 units  CBG 351 - 400: 9 units   insulin glargine-yfgn 100 UNIT/ML injection Commonly known as: SEMGLEE Inject 0.2 mLs (20 Units total) into the skin daily.   ipratropium-albuterol 0.5-2.5 (3) MG/3ML Soln Commonly known as: DUONEB Take 3 mLs by nebulization every 4 (four) hours as needed.   losartan 100 MG tablet Commonly known as: COZAAR losartan 100 mg tablet   metFORMIN 500 MG 24 hr tablet Commonly known as: GLUCOPHAGE-XR Take 500 mg by mouth daily with breakfast.   pantoprazole 40 MG tablet Commonly known as: PROTONIX Take 1 tablet (40 mg total) by mouth daily.   predniSONE 10 MG (21) Tbpk tablet Commonly known as: STERAPRED UNI-PAK 21 TAB Prednisone 40 mg daily for 3 days followed by  Prednisone 20 mg daily for 3 days followed by  Prednisone 10 mg daily for 3 days. What changed:  how to take this when to take this additional instructions   Spiriva HandiHaler 18 MCG inhalation capsule Generic drug: tiotropium Place 1 capsule (18 mcg total) into inhaler and inhale daily.       ASK your doctor about these medications    azithromycin 250 MG tablet Commonly known as: ZITHROMAX Take 2 tablets (500 mg total) by mouth daily for 2 days. Ask about: Should I  take this medication?        Allergies  Allergen Reactions   Penicillins Hives and Other (See Comments)    Has patient had a PCN reaction causing immediate rash, facial/tongue/throat swelling, SOB or lightheadedness with hypotension: No Has patient had a PCN reaction causing severe rash involving mucus membranes or skin necrosis: No Has patient had a PCN reaction that required hospitalization: No Has patient had a PCN reaction occurring within the last 10 years: No If all of the above answers are "NO", then may proceed with Cephalosporin use.    Cardiology.    Procedures/Studies: DG Chest Portable 1 View  Result Date: 09/02/2021 CLINICAL DATA:  COPD exacerbation EXAM: PORTABLE CHEST 1 VIEW COMPARISON:  07/08/2018 FINDINGS: Mild bibasilar atelectasis. 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 No pneumothorax or pleural effusion. Cardiac size within normal limits. Pulmonary vascularity is otherwise normal. No acute bone abnormality. IMPRESSION: No active disease.  Emphysema. Electronically Signed   By: Helyn Numbers M.D.   On: 09/02/2021 21:41  ECHOCARDIOGRAM COMPLETE  Result Date: 09/04/2021    ECHOCARDIOGRAM REPORT   Patient Name:   Katherine Carter Date of Exam: 09/04/2021 Medical Rec #:  [id number]               Height:       62.0 in Accession #:    [id number]              Weight:       168.4 lb Date of Birth:  22-Feb-1951               BSA:          1.777 m Patient Age:    32 years                BP:           131/70 mmHg Patient Gender: F                       HR:           87 bpm. Exam Location:  [facility] Procedure: 2D Echo Indications:     CHF I50.9  History:         Patient has no prior history of Echocardiogram examinations.  Sonographer:     Kathlen Brunswick RDCS Referring Phys:  [place] Diagnosing Phys: Skeet Latch MD IMPRESSIONS  1. Left ventricular ejection fraction, by estimation, is 65 to  70%. The left ventricle has normal function. The left ventricle has no regional wall motion abnormalities. Left ventricular diastolic parameters are consistent with Grade I diastolic dysfunction (impaired relaxation).  2. Right ventricular systolic function is normal. The right ventricular size is normal. There is normal pulmonary artery systolic pressure.  3. The mitral valve is normal in structure. No evidence of mitral valve regurgitation. No evidence of mitral stenosis.  4. The aortic valve is tricuspid. There is mild calcification of the aortic valve. There is mild thickening of the aortic valve. Aortic valve regurgitation is not visualized. Aortic valve sclerosis is present, with no evidence of aortic valve stenosis. Aortic valve area, by VTI measures 2.79 cm. Aortic valve mean gradient measures 8.0 mmHg. Aortic valve Vmax measures 1.98 m/s.  5. The inferior vena cava is normal in size with greater than 50% respiratory variability, suggesting right atrial pressure of 3 mmHg. FINDINGS  Left Ventricle: Left ventricular ejection fraction, by estimation, is 65 to 70%. The left ventricle has normal function. The left ventricle has no regional wall motion abnormalities. The left ventricular internal cavity size was normal in size. There is  no left ventricular hypertrophy. Left ventricular diastolic parameters are consistent with Grade I diastolic dysfunction (impaired relaxation). Indeterminate filling pressures. Right Ventricle: The right ventricular size is normal. No increase in right ventricular wall thickness. Right ventricular systolic function is normal. There is normal pulmonary artery systolic pressure. The tricuspid regurgitant velocity is 1.72 m/s, and  with an assumed right atrial pressure of 3 mmHg, the estimated right ventricular systolic pressure is [id number] mmHg. Left Atrium: Left atrial size was normal in size. Right Atrium: Right atrial size was normal in size. Pericardium: There is no evidence of  pericardial effusion. Mitral Valve: The mitral valve is normal in structure. No evidence of mitral valve regurgitation. No evidence of mitral valve stenosis. Tricuspid Valve: The tricuspid valve is normal in structure. Tricuspid valve regurgitation is trivial. No evidence of tricuspid stenosis. Aortic Valve: The aortic valve is tricuspid. There is mild calcification of  the aortic valve. There is mild thickening of the aortic valve. Aortic valve regurgitation is not visualized. Aortic valve sclerosis is present, with no evidence of aortic valve stenosis. Aortic valve mean gradient measures 8.0 mmHg. Aortic valve peak gradient measures 15.7 mmHg. Aortic valve area, by VTI measures 2.79 cm. Pulmonic Valve: The pulmonic valve was normal in structure. Pulmonic valve regurgitation is not visualized. No evidence of pulmonic stenosis. Aorta: The aortic root is normal in size and structure. Venous: The inferior vena cava is normal in size with greater than 50% respiratory variability, suggesting right atrial pressure of 3 mmHg. IAS/Shunts: No atrial level shunt detected by color flow Doppler.  LEFT VENTRICLE PLAX 2D LVIDd:         3.90 cm     Diastology LVIDs:         2.50 cm     LV e' medial:    6.42 cm/s LV PW:         0.90 cm     LV E/e' medial:  10.6 LV IVS:        1.01 cm     LV e' lateral:   7.29 cm/s LVOT diam:     2.10 cm     LV E/e' lateral: 9.3 LV SV:         88 LV SV Index:   50 LVOT Area:     3.46 cm  LV Volumes (MOD) LV vol d, MOD A2C: 42.2 ml LV vol d, MOD A4C: 47.6 ml LV vol s, MOD A2C: 20.8 ml LV vol s, MOD A4C: 15.4 ml LV SV MOD A2C:     21.4 ml LV SV MOD A4C:     47.6 ml LV SV MOD BP:      26.0 ml RIGHT VENTRICLE RV Basal diam:  2.30 cm RV S prime:     17.40 cm/s TAPSE (M-mode): 2.4 cm LEFT ATRIUM           Index        RIGHT ATRIUM           Index LA diam:      3.60 cm 2.03 cm/m   RA Area:     17.00 cm LA Vol (A2C): 15.0 ml 8.44 ml/m   RA Volume:   42.50 ml  23.92 ml/m LA Vol (A4C): 40.5 ml 22.79  ml/m  AORTIC VALVE AV Area (Vmax):    2.22 cm AV Area (Vmean):   2.31 cm AV Area (VTI):     2.79 cm AV Vmax:           198.00 cm/s AV Vmean:          131.000 cm/s AV VTI:            0.316 m AV Peak Grad:      15.7 mmHg AV Mean Grad:      8.0 mmHg LVOT Vmax:         127.00 cm/s LVOT Vmean:        87.500 cm/s LVOT VTI:          0.255 m LVOT/AV VTI ratio: 0.81  AORTA Ao Root diam: 2.60 cm MITRAL VALVE                TRICUSPID VALVE MV Area (PHT): 3.87 cm     TR Peak grad:   11.8 mmHg MV Decel Time: 196 msec     TR Vmax:        172.00 cm/s MV E velocity: 68.10  cm/s MV A velocity: 108.00 cm/s  SHUNTS MV E/A ratio:  0.63         Systemic VTI:  0.26 m                             Systemic Diam: 2.10 cm Skeet Latch MD Electronically signed by Skeet Latch MD Signature Date/Time: 09/04/2021/12:45:31 PM    Final       Subjective: No new complaints.   Discharge Exam: Vitals:   09/05/21 0742 09/05/21 1131  BP: 121/84 119/78  Pulse: 90 (!) 123  Resp: 18 20  Temp: (!) 97.5 F (36.4 C) 98.2 F (36.8 C)  SpO2: 93% 94%   Vitals:   09/05/21 0000 09/05/21 0500 09/05/21 0742 09/05/21 1131  BP: 115/67 131/71 121/84 119/78  Pulse: 80 82 90 (!) 123  Resp:  20 18 20   Temp: 98.2 F (36.8 C) 97.9 F (36.6 C) (!) 97.5 F (36.4 C) 98.2 F (36.8 C)  TempSrc: Oral Oral    SpO2: 92% 94% 93% 94%  Weight:      Height:        General: Pt is alert, awake, not in acute distress Cardiovascular: RRR, S1/S2 +, no rubs, no gallops Respiratory: CTA bilaterally, no wheezing, no rhonchi Abdominal: Soft, NT, ND, bowel sounds + Extremities: no edema, no cyanosis    The results of significant diagnostics from this hospitalization (including imaging, microbiology, ancillary and laboratory) are listed below for reference.     Microbiology: No results found for this or any previous visit (from the past 240 hour(s)).   Labs: BNP (last 3 results) Recent Labs    09/02/21 2109 09/04/21 0635  BNP 14.2  [id number]   Basic Metabolic Panel: No results for input(s): NA, K, CL, CO2, GLUCOSE, BUN, CREATININE, CALCIUM, MG, PHOS in the last 168 hours. Liver Function Tests: No results for input(s): AST, ALT, ALKPHOS, BILITOT, PROT, ALBUMIN in the last 168 hours. No results for input(s): LIPASE, AMYLASE in the last 168 hours. No results for input(s): AMMONIA in the last 168 hours. CBC: No results for input(s): WBC, NEUTROABS, HGB, HCT, MCV, PLT in the last 168 hours. Cardiac Enzymes: No results for input(s): CKTOTAL, CKMB, CKMBINDEX, TROPONINI in the last 168 hours. BNP: Invalid input(s): POCBNP CBG: No results for input(s): GLUCAP in the last 168 hours. D-Dimer No results for input(s): DDIMER in the last 72 hours. Hgb A1c No results for input(s): HGBA1C in the last 72 hours. Lipid Profile No results for input(s): CHOL, HDL, LDLCALC, TRIG, CHOLHDL, LDLDIRECT in the last 72 hours. Thyroid function studies No results for input(s): TSH, T4TOTAL, T3FREE, THYROIDAB in the last 72 hours.  Invalid input(s): FREET3 Anemia work up No results for input(s): VITAMINB12, FOLATE, FERRITIN, TIBC, IRON, RETICCTPCT in the last 72 hours. Urinalysis No results found for: COLORURINE, APPEARANCEUR, LABSPEC, [place], GLUCOSEU, HGBUR, BILIRUBINUR, KETONESUR, PROTEINUR, UROBILINOGEN, NITRITE, LEUKOCYTESUR Sepsis Labs Invalid input(s): PROCALCITONIN,  WBC,  LACTICIDVEN Microbiology No results found for this or any previous visit (from the past 240 hour(s)).   Time coordinating discharge: 36 minutes.   SIGNED:   Hosie Poisson, MD  [facility]

## 2021-10-15 ENCOUNTER — Ambulatory Visit
Admission: EM | Admit: 2021-10-15 | Discharge: 2021-10-15 | Disposition: A | Discharge status: Home / Self Care | Payer: Medicare Other | Attending: Emergency Medicine | Admitting: Emergency Medicine

## 2021-10-15 ENCOUNTER — Other Ambulatory Visit: Payer: Self-pay

## 2021-10-15 ENCOUNTER — Encounter: Payer: Self-pay | Admitting: Gynecology

## 2021-10-15 DIAGNOSIS — J441 Chronic obstructive pulmonary disease with (acute) exacerbation: Secondary | ICD-10-CM | POA: Diagnosis not present

## 2021-10-15 MED ORDER — LEVOFLOXACIN 500 MG PO TABS: 500.0000 mg | ORAL_TABLET | Freq: Every day | ORAL | 0 refills | Status: AC

## 2021-10-15 MED ORDER — BENZONATATE 200 MG PO CAPS: 200.0000 mg | ORAL_CAPSULE | Freq: Three times a day (TID) | ORAL | 0 refills | Status: DC | PRN

## 2021-10-15 MED ORDER — FLUTICASONE PROPIONATE 50 MCG/ACT NA SUSP: 2.0000 | Freq: Every day | NASAL | 0 refills | Status: DC

## 2021-10-15 MED ORDER — AEROCHAMBER PLUS MISC: 2 refills | Status: AC

## 2021-10-15 NOTE — Discharge Instructions (Addendum)
2 puffs from your Xopenex inhaler using your spacer every 4 hours for 2 days, and then every 6 hours for 2 days, then as needed.  You can back off on the Xopenex if you start to feel better.  Finish the Levaquin, even if you feel better.  I am sending you home on Levaquin instead of Augmentin because you have a penicillin allergy.  I am also sending you home on 40, not 50 mg of prednisone because of the diabetes.  I think you will work just as well and it will have less impact on your blood sugars.  Flonase, saline nasal irrigation with a NeilMed sinus rinse and distilled water as often as you want for the nasal congestion and postnasal drip.  Tessalon for the cough.  Continue your Symbicort.

## 2021-10-15 NOTE — ED Triage Notes (Signed)
Patient c/o of cough / chest congestion x 1 week. Per patient has a hx of COPD.

## 2021-10-15 NOTE — ED Provider Notes (Signed)
HPI  SUBJECTIVE:  Katherine Carter is a 71 y.o. female who presents with 1 week of nasal congestion, postnasal drip, cough productive of clear sputum, wheezing.   She reports increased amount of productive cough.  She states that both of her ears feel full, stopped up.  No change in her baseline shortness of breath, fevers, nasal congestion, sinus pain or pressure, facial swelling, dental pain, dyspnea on exertion, ear pain, change in hearing.  She is unable to sleep at night secondary to the cough.  No antibiotics in the past 3 months.  No antipyretic in the past 6 hours.  No recent steroid use.  She has tried Mucinex DM with improvement in her symptoms.  She has not used her albuterol inhaler.  No aggravating factors.  She has a past medical history of COPD and is compliant with her preventative inhalers, diabetes, hypertension, pneumonia, sepsis secondary to pneumonia.  [facility]    Past Medical History:  Diagnosis Date   COPD (chronic obstructive pulmonary disease) (HCC)    Diabetes mellitus without complication (HCC)    GERD (gastroesophageal reflux disease)    Hypertension     Past Surgical History:  Procedure Laterality Date   ABDOMINAL HYSTERECTOMY     COLONOSCOPY WITH PROPOFOL N/A 01/23/2017   Procedure: COLONOSCOPY WITH PROPOFOL;  Surgeon: Christena Deem, MD;  Location: [facility] ENDOSCOPY;  Service: Endoscopy;  Laterality: N/A;   FOOT SURGERY      Family History  Problem Relation Age of Onset   CAD Mother    CAD Father    Breast cancer Neg Hx     Social History   Tobacco Use   Smoking status: Former    Packs/day: 0.50    Years: 25.00    Pack years: 12.50    Types: Cigarettes    Quit date: 07/06/2014    Years since quitting: 7.2   Smokeless tobacco: Never  Vaping Use   Vaping Use: Never used  Substance Use Topics   Alcohol use: No    Alcohol/week: 0.0 standard drinks   Drug use: No    No current facility-administered medications for this  encounter.  Current Outpatient Medications:    acetaminophen (TYLENOL) 325 MG tablet, Take 2 tablets (650 mg total) by mouth every 6 (six) hours as needed for mild pain (or Fever >/= 101)., Disp: , Rfl:    amLODipine (NORVASC) 5 MG tablet, Take 1 tablet (5 mg total) by mouth daily., Disp: 30 tablet, Rfl: 0   aspirin EC 81 MG tablet, Take 81 mg by mouth daily., Disp: , Rfl:    atorvastatin (LIPITOR) 40 MG tablet, Take 1 tablet (40 mg total) by mouth every evening., Disp: 30 tablet, Rfl: 1   benzonatate (TESSALON) 200 MG capsule, Take 1 capsule (200 mg total) by mouth 3 (three) times daily as needed for cough., Disp: 30 capsule, Rfl: 0   budesonide-formoterol (SYMBICORT) 160-4.5 MCG/ACT inhaler, Inhale 2 puffs into the lungs 2 (two) times daily., Disp: , Rfl:    fluticasone (FLONASE) 50 MCG/ACT nasal spray, Place 2 sprays into both nostrils daily., Disp: 16 g, Rfl: 0   ipratropium-albuterol (DUONEB) 0.5-2.5 (3) MG/3ML SOLN, Take 3 mLs by nebulization every 4 (four) hours as needed., Disp: 360 mL, Rfl: 3   levofloxacin (LEVAQUIN) 500 MG tablet, Take 1 tablet (500 mg total) by mouth daily for 5 days., Disp: 5 tablet, Rfl: 0   metFORMIN (GLUCOPHAGE-XR) 500 MG 24 hr tablet, Take 500 mg by mouth daily  with breakfast., Disp: , Rfl:    pantoprazole (PROTONIX) 40 MG tablet, Take 1 tablet (40 mg total) by mouth daily., Disp: 30 tablet, Rfl: 3   Spacer/Aero-Holding Chambers (AEROCHAMBER PLUS) inhaler, Use with inhaler, Disp: 1 each, Rfl: 2   tiotropium (SPIRIVA HANDIHALER) 18 MCG inhalation capsule, Place 1 capsule (18 mcg total) into inhaler and inhale daily., Disp: 30 capsule, Rfl: 0   insulin aspart (NOVOLOG) 100 UNIT/ML injection, CBG 70 - 120: 0 units  CBG 121 - 150: 1 unit  CBG 151 - 200: 2 units  CBG 201 - 250: 3 units  CBG 251 - 300: 5 units  CBG 301 - 350: 7 units  CBG 351 - 400: 9 units, Disp: 10 mL, Rfl: 11   insulin glargine-yfgn (SEMGLEE) 100 UNIT/ML injection, Inject 0.2 mLs (20 Units total) into  the skin daily., Disp: 10 mL, Rfl: 11   losartan (COZAAR) 100 MG tablet, losartan 100 mg tablet (Patient not taking: Reported on 09/02/2021), Disp: , Rfl:   Allergies  Allergen Reactions   Penicillins Hives and Other (See Comments)    Has patient had a PCN reaction causing immediate rash, facial/tongue/throat swelling, SOB or lightheadedness with hypotension: No Has patient had a PCN reaction causing severe rash involving mucus membranes or skin necrosis: No Has patient had a PCN reaction that required hospitalization: No Has patient had a PCN reaction occurring within the last 10 years: No If all of the above answers are "NO", then may proceed with Cephalosporin use.     ROS  As noted in HPI.   Physical Exam  BP 131/79 (BP Location: Left Arm)    Pulse 74    Temp 98.4 F (36.9 C) (Oral)    Resp 16    Ht 5\' 3"  (1.6 m)    Wt 77.1 kg    SpO2 96%    BMI 30.11 kg/m   Constitutional: Well developed, well nourished, no acute distress Eyes:  EOMI, conjunctiva normal bilaterally HENT: Normocephalic, atraumatic,mucus membranes moist.  Clear nasal congestion.  Normal turbinates.  No sinus tenderness.  Positive postnasal drip. Respiratory: Normal inspiratory effort, poor air movement, diffuse expiratory wheezing.  No rales or rhonchi.  Positive anterior chest wall tenderness Cardiovascular: Normal rate, regular rhythm, no murmurs rubs or gallops GI: nondistended skin: No rash, skin intact Musculoskeletal: no deformities Neurologic: Alert & oriented x 3, no focal neuro deficits Psychiatric: Speech and behavior appropriate   ED Course   Medications - No data to display  No orders of the defined types were placed in this encounter.   No results found for this or any previous visit (from the past 24 hour(s)). No results found.  ED Clinical Impression  1. COPD exacerbation [facility])      ED Assessment/Plan  Concern for COPD exacerbation.  Will send home with levaquin as she has a  penicillin allergy,  prednisone 40 mg for 5 days, Flonase, saline nasal irrigation, Tessalon,  regularly scheduled Xopenex with her spacer, she does not need a prescription for Xopenex. deferring chest x-ray today in the absence of fevers, tachycardia, hypoxia, focal lung findings. She  will follow-up with her doctor in 3 days if not getting any better.  ER return precautions given.  Discussed MDM, treatment plan, and plan for follow-up with patient. Discussed sn/sx that should prompt return to the ED. patient agrees with plan.   Meds ordered this encounter  Medications   fluticasone (FLONASE) 50 MCG/ACT nasal spray    Sig: Place 2  sprays into both nostrils daily.    Dispense:  16 g    Refill:  0   Spacer/Aero-Holding Chambers (AEROCHAMBER PLUS) inhaler    Sig: Use with inhaler    Dispense:  1 each    Refill:  2    Please educate patient on use   benzonatate (TESSALON) 200 MG capsule    Sig: Take 1 capsule (200 mg total) by mouth 3 (three) times daily as needed for cough.    Dispense:  30 capsule    Refill:  0   levofloxacin (LEVAQUIN) 500 MG tablet    Sig: Take 1 tablet (500 mg total) by mouth daily for 5 days.    Dispense:  5 tablet    Refill:  0      *This clinic note was created using Scientist, clinical (histocompatibility and immunogenetics). Therefore, there may be occasional mistakes despite careful proofreading.  ?    Domenick Gong, MD 10/17/21 [phone]

## 2021-10-24 ENCOUNTER — Ambulatory Visit (INDEPENDENT_AMBULATORY_CARE_PROVIDER_SITE_OTHER): Payer: Medicare Other

## 2021-10-24 ENCOUNTER — Other Ambulatory Visit: Payer: Self-pay

## 2021-10-24 ENCOUNTER — Ambulatory Visit
Admission: EM | Admit: 2021-10-24 | Discharge: 2021-10-24 | Disposition: A | Discharge status: Home / Self Care | Payer: Medicare Other | Attending: Internal Medicine | Admitting: Internal Medicine

## 2021-10-24 DIAGNOSIS — J441 Chronic obstructive pulmonary disease with (acute) exacerbation: Secondary | ICD-10-CM

## 2021-10-24 DIAGNOSIS — U071 COVID-19: Secondary | ICD-10-CM | POA: Insufficient documentation

## 2021-10-24 LAB — CBC WITH DIFFERENTIAL/PLATELET
Abs Immature Granulocytes: 0.03 10*3/uL (ref 0.00–0.07)
Basophils Absolute: 0 10*3/uL (ref 0.0–0.1)
Basophils Relative: 1 %
Eosinophils Absolute: 0.1 10*3/uL (ref 0.0–0.5)
Eosinophils Relative: 1 %
HCT: 41.2 % (ref 36.0–46.0)
Hemoglobin: 13.3 g/dL (ref 12.0–15.0)
Immature Granulocytes: 0 %
Lymphocytes Relative: 10 %
Lymphs Abs: 0.9 10*3/uL (ref 0.7–4.0)
MCH: 29.8 pg (ref 26.0–34.0)
MCHC: 32.3 g/dL (ref 30.0–36.0)
MCV: 92.2 fL (ref 80.0–100.0)
Monocytes Absolute: 0.9 10*3/uL (ref 0.1–1.0)
Monocytes Relative: 10 %
Neutro Abs: 6.7 10*3/uL (ref 1.7–7.7)
Neutrophils Relative %: 78 %
Platelets: 190 10*3/uL (ref 150–400)
RBC: 4.47 MIL/uL (ref 3.87–5.11)
RDW: 15.9 % — ABNORMAL HIGH (ref 11.5–15.5)
WBC: 8.6 10*3/uL (ref 4.0–10.5)
nRBC: 0 % (ref 0.0–0.2)

## 2021-10-24 LAB — BASIC METABOLIC PANEL
Anion gap: 14 (ref 5–15)
BUN: 15 mg/dL (ref 8–23)
CO2: 21 mmol/L — ABNORMAL LOW (ref 22–32)
Calcium: 9.6 mg/dL (ref 8.9–10.3)
Chloride: 105 mmol/L (ref 98–111)
Creatinine, Ser: 1.04 mg/dL — ABNORMAL HIGH (ref 0.44–1.00)
GFR, Estimated: 58 mL/min — ABNORMAL LOW (ref >60)
Glucose, Bld: 169 mg/dL — ABNORMAL HIGH (ref 70–99)
Potassium: 4.4 mmol/L (ref 3.5–5.1)
Sodium: 140 mmol/L (ref 135–145)

## 2021-10-24 LAB — RESP PANEL BY RT-PCR (FLU A&B, COVID) ARPGX2
Influenza A by PCR: NEGATIVE
Influenza B by PCR: NEGATIVE
SARS Coronavirus 2 by RT PCR: POSITIVE — AB

## 2021-10-24 MED ORDER — METHYLPREDNISOLONE SODIUM SUCC 125 MG IJ SOLR
60.0000 mg | Freq: Once | INTRAMUSCULAR | Status: AC
  Administered 2021-10-24: 60 mg via INTRAMUSCULAR

## 2021-10-24 MED ORDER — NIRMATRELVIR/RITONAVIR (PAXLOVID) TABLET (RENAL DOSING): 2.0000 | ORAL_TABLET | Freq: Two times a day (BID) | ORAL | 0 refills | Status: AC

## 2021-10-24 MED ORDER — BENZONATATE 200 MG PO CAPS: 200.0000 mg | ORAL_CAPSULE | Freq: Three times a day (TID) | ORAL | 0 refills | Status: DC

## 2021-10-24 MED ORDER — PREDNISONE 20 MG PO TABS: 40.0000 mg | ORAL_TABLET | Freq: Every day | ORAL | 0 refills | Status: AC

## 2021-10-24 NOTE — ED Triage Notes (Signed)
Patient is here with "Cough/Congestion". Symptoms began "last week, so about a week now". Cough "wet". Hip "right" seems to hurt some too.No fever. No recent COVID19 test.

## 2021-10-24 NOTE — ED Provider Notes (Addendum)
[facility]    CSN: [id number] Arrival date & time: 10/24/21  0820      History   Chief Complaint Chief Complaint  Patient presents with   Cough   Nasal Congestion    HPI Katherine Carter is a 71 y.o. female with a history of COPD comes to urgent care with 1 week history of nasal congestion, shortness of breath, wheezing and a cough.  Cough is productive of clear sputum.  Patient denies any sore throat.  No nausea or vomiting.  Patient denies any fever or chills.  She is supposed to be using 1 L of oxygen via nasal cannula but she came to the urgent care without her supplemental oxygen.  Patient denies any dizziness, near syncope or syncopal episodes.  No lower extremity swelling.  Patient is accompanied by her husband and he endorses patient's symptoms.  She denies any confusion.  Husband denies excessive sleepiness.  Her cough has worsened over the past few days and has significantly disrupted her sleep pattern.Marland Kitchen   HPI  Past Medical History:  Diagnosis Date   COPD (chronic obstructive pulmonary disease) ([place])    Diabetes mellitus without complication (HCC)    GERD (gastroesophageal reflux disease)    Hypertension     Patient Active Problem List   Diagnosis Date Noted   Pure hypercholesterolemia    Elevated troponin    Acute respiratory failure with hypoxemia ([place]) 09/02/2021   Sepsis ([place]) 07/08/2018   CAP (community acquired pneumonia) 07/08/2018   Diabetes ([place]) 07/08/2018   HTN (hypertension) 07/08/2018   COPD exacerbation ([place]) 07/08/2018   Acute respiratory failure with hypoxia and hypercapnia ([place]) 12/10/2015    Past Surgical History:  Procedure Laterality Date   ABDOMINAL HYSTERECTOMY     COLONOSCOPY WITH PROPOFOL N/A 01/23/2017   Procedure: COLONOSCOPY WITH PROPOFOL;  Surgeon: Lollie Sails, MD;  Location: [facility] ENDOSCOPY;  Service: Endoscopy;  Laterality: N/A;   FOOT SURGERY      OB History   No obstetric history on file.       Home Medications    Prior to Admission medications   Medication Sig Start Date End Date Taking? Authorizing Provider  amLODipine (NORVASC) 5 MG tablet Take 1 tablet (5 mg total) by mouth daily. 09/05/21  Yes Hosie Poisson, MD  aspirin EC 81 MG tablet Take 81 mg by mouth daily.   Yes [provider]  atorvastatin (LIPITOR) 40 MG tablet Take 1 tablet (40 mg total) by mouth every evening. 09/05/21  Yes Hosie Poisson, MD  budesonide-formoterol (SYMBICORT) 160-4.5 MCG/ACT inhaler Inhale 2 puffs into the lungs 2 (two) times daily.   Yes [provider]  fluticasone (FLONASE) 50 MCG/ACT nasal spray Place 2 sprays into both nostrils daily. 10/15/21  Yes Melynda Ripple, MD  insulin aspart (NOVOLOG) 100 UNIT/ML injection CBG 70 - 120: 0 units  CBG 121 - 150: 1 unit  CBG 151 - 200: 2 units  CBG 201 - 250: 3 units  CBG 251 - 300: 5 units  CBG 301 - 350: 7 units  CBG 351 - 400: 9 units 09/05/21  Yes Hosie Poisson, MD  insulin glargine-yfgn (SEMGLEE) 100 UNIT/ML injection Inject 0.2 mLs (20 Units total) into the skin daily. 09/06/21  Yes Hosie Poisson, MD  ipratropium-albuterol (DUONEB) 0.5-2.5 (3) MG/3ML SOLN Take 3 mLs by nebulization every 4 (four) hours as needed. Patient taking differently: Take 3 mLs by nebulization every 4 (four) hours as needed. Last dose: 730 am. 09/05/21  Yes  Hosie Poisson, MD  losartan (COZAAR) 100 MG tablet  04/16/20  Yes [provider]  metFORMIN (GLUCOPHAGE-XR) 500 MG 24 hr tablet Take 500 mg by mouth daily with breakfast.   Yes [provider]  nirmatrelvir/ritonavir EUA, renal dosing, (PAXLOVID) 10 x 150 MG & 10 x 100MG  TABS Take 2 tablets by mouth 2 (two) times daily for 5 days. Patient GFR is 49. Take nirmatrelvir (150 mg) one tablet twice daily for 5 days and ritonavir (100 mg) one tablet twice daily for 5 days. 10/24/21 10/29/21 Yes Lainey Nelson, Myrene Galas, MD  pantoprazole (PROTONIX) 40 MG tablet Take 1 tablet (40 mg total) by mouth daily.  06/11/21  Yes Cook, Barnie Del, DO  Spacer/Aero-Holding Chambers (AEROCHAMBER PLUS) inhaler Use with inhaler 10/15/21  Yes Melynda Ripple, MD  tiotropium (SPIRIVA HANDIHALER) 18 MCG inhalation capsule Place 1 capsule (18 mcg total) into inhaler and inhale daily. 09/05/21  Yes Hosie Poisson, MD  acetaminophen (TYLENOL) 325 MG tablet Take 2 tablets (650 mg total) by mouth every 6 (six) hours as needed for mild pain (or Fever >/= 101). 12/13/15   Gouru, Illene Silver, MD  benzonatate (TESSALON) 200 MG capsule Take 1 capsule (200 mg total) by mouth 3 (three) times daily as needed for cough. 10/15/21   Melynda Ripple, MD    Family History Family History  Problem Relation Age of Onset   CAD Mother    CAD Father    Breast cancer Neg Hx     Social History Social History   Tobacco Use   Smoking status: Former    Packs/day: 0.50    Years: 25.00    Pack years: 12.50    Types: Cigarettes    Quit date: 07/06/2014    Years since quitting: 7.3   Smokeless tobacco: Never  Vaping Use   Vaping Use: Never used  Substance Use Topics   Alcohol use: No    Alcohol/week: 0.0 standard drinks   Drug use: No     Allergies   Penicillins   Review of Systems Review of Systems  Constitutional:  Positive for fatigue. Negative for chills and fever.  HENT:  Positive for congestion and rhinorrhea. Negative for sore throat, trouble swallowing and voice change.   Respiratory:  Positive for cough, chest tightness, shortness of breath and wheezing.   Cardiovascular:  Negative for chest pain.  Gastrointestinal: Negative.   Musculoskeletal: Negative.   Neurological:  Positive for weakness.  Psychiatric/Behavioral:  Negative for confusion.     Physical Exam Triage Vital Signs ED Triage Vitals [10/24/21 0838]  Enc Vitals Group     BP (!) 145/88     Pulse Rate (!) 131     Resp (!) 22     Temp 100 F (37.8 C)     Temp Source Oral     SpO2 94 %     Weight 169 lb 15.6 oz (77.1 kg)     Height 5\' 3"  (1.6 m)      Head Circumference      Peak Flow      Pain Score 10     Pain Loc      Pain Edu?      Excl. in [place]?    No data found.  Updated Vital Signs BP (!) 153/92 (BP Location: Left Arm)    Pulse (!) 135    Temp 100 F (37.8 C) (Oral)    Resp (!) 28    Ht 5\' 3"  (1.6 m)    Wt 77.1  kg    SpO2 95%    BMI 30.11 kg/m   Visual Acuity Right Eye Distance:   Left Eye Distance:   Bilateral Distance:    Right Eye Near:   Left Eye Near:    Bilateral Near:     Physical Exam Vitals and nursing note reviewed.  Constitutional:      Appearance: She is ill-appearing.  HENT:     Mouth/Throat:     Mouth: Mucous membranes are moist.  Cardiovascular:     Rate and Rhythm: Regular rhythm. Tachycardia present.     Pulses: Normal pulses.  Pulmonary:     Effort: Pulmonary effort is normal. No respiratory distress.     Breath sounds: Wheezing and rales present. No rhonchi.  Abdominal:     General: Abdomen is flat.  Musculoskeletal:     Cervical back: No rigidity or tenderness.  Skin:    General: Skin is warm and dry.  Neurological:     Mental Status: She is alert.     UC Treatments / Results  Labs (all labs ordered are listed, but only abnormal results are displayed) Labs Reviewed  RESP PANEL BY RT-PCR (FLU A&B, COVID) ARPGX2 - Abnormal; Notable for the following components:      Result Value   SARS Coronavirus 2 by RT PCR POSITIVE (*)    All other components within normal limits  CBC WITH DIFFERENTIAL/PLATELET - Abnormal; Notable for the following components:   RDW 15.9 (*)    All other components within normal limits  BASIC METABOLIC PANEL    EKG   Radiology DG Chest 2 View  Result Date: 10/24/2021 CLINICAL DATA:  Provided history: COPD exacerbation. Additional history provided: Cough/congestion, symptoms began last week. No fever. EXAM: CHEST - 2 VIEW COMPARISON:  Prior chest radiographs 09/02/2021 and earlier. Chest CT 07/09/2018. Report from chest radiographs 08/04/2020 (images  unavailable). FINDINGS: Heart size within normal limits. Aortic atherosclerosis. No appreciable airspace consolidation. Known emphysema better appreciated on the prior chest CT of 07/09/2018. No evidence of pleural effusion or pneumothorax. No acute bony abnormality identified. Degenerative changes of the spine. Surgical clips within the right upper quadrant of the abdomen. IMPRESSION: No evidence of acute cardiopulmonary abnormality. Aortic Atherosclerosis (ICD10-I70.0) and Emphysema (ICD10-J43.9). Electronically Signed   By: Kellie Simmering D.O.   On: 10/24/2021 10:20    Procedures Procedures (including critical care time)  Medications Ordered in UC Medications  methylPREDNISolone sodium succinate (SOLU-MEDROL) 125 mg/2 mL injection 60 mg (60 mg Intramuscular Given 10/24/21 1019)    Initial Impression / Assessment and Plan / UC Course  I have reviewed the triage vital signs and the nursing notes.  Pertinent labs & imaging results that were available during my care of the patient were reviewed by me and considered in my medical decision making (see chart for details).     1.  COPD with acute exacerbation: 2.  COVID-19 infection: COVID-19 PCR test is positive Paxlovid 2 tablets twice daily for 5 days.  GFR is 49 CBC, BMP Chest x-ray Solu-Medrol 60 mg IM x1 dose Respiratory PCR is negative for flu Continue albuterol and Symbicort use at home If symptoms worsen please go to ED for further evaluation. Final Clinical Impressions(s) / UC Diagnoses   Final diagnoses:  None   Discharge Instructions   None    ED Prescriptions     Medication Sig Dispense Auth. Provider   nirmatrelvir/ritonavir EUA, renal dosing, (PAXLOVID) 10 x 150 MG & 10 x 100MG  TABS Take 2  tablets by mouth 2 (two) times daily for 5 days. Patient GFR is 49. Take nirmatrelvir (150 mg) one tablet twice daily for 5 days and ritonavir (100 mg) one tablet twice daily for 5 days. 20 tablet Rylyn Zawistowski, Myrene Galas, MD      PDMP  not reviewed this encounter.   Chase Picket, MD 10/24/21 1031    Chase Picket, MD 10/24/21 1031

## 2021-10-24 NOTE — Discharge Instructions (Addendum)
Your Covid-19 test is positive.   Please follow the current CDC guidance (updated Dec. 27, 2021) below:  Stay at home for 5 days starting on If you have no symptoms or your symptoms are improving after 5 days AND you have no fever for 24 hours, you can leave your house Please continue to wear a well-fitting face mask for 5 additional days when you are around other people. You may be able to return to work after day 5 if your symptoms are improving AND you have no fever.

## 2021-11-03 ENCOUNTER — Ambulatory Visit (INDEPENDENT_AMBULATORY_CARE_PROVIDER_SITE_OTHER): Payer: Medicare Other

## 2021-11-03 ENCOUNTER — Ambulatory Visit
Admission: EM | Admit: 2021-11-03 | Discharge: 2021-11-03 | Disposition: A | Discharge status: Home / Self Care | Payer: Medicare Other

## 2021-11-03 ENCOUNTER — Other Ambulatory Visit: Payer: Self-pay

## 2021-11-03 DIAGNOSIS — J441 Chronic obstructive pulmonary disease with (acute) exacerbation: Secondary | ICD-10-CM

## 2021-11-03 DIAGNOSIS — B370 Candidal stomatitis: Secondary | ICD-10-CM

## 2021-11-03 DIAGNOSIS — R0602 Shortness of breath: Secondary | ICD-10-CM

## 2021-11-03 DIAGNOSIS — R059 Cough, unspecified: Secondary | ICD-10-CM

## 2021-11-03 MED ORDER — NYSTATIN 100000 UNIT/ML MT SUSP: 500000.0000 [IU] | Freq: Four times a day (QID) | OROMUCOSAL | 0 refills | Status: DC

## 2021-11-03 MED ORDER — BENZONATATE 100 MG PO CAPS: 200.0000 mg | ORAL_CAPSULE | Freq: Three times a day (TID) | ORAL | 0 refills | Status: DC

## 2021-11-03 NOTE — Discharge Instructions (Addendum)
For your oral thrush take the nystatin swish and swallow 4 times a day for the next 10 days.  If you are still having issues please continue for an additional 4 days. ? ?Use the Tessalon Perles every 8 hours as needed for cough. ? ?Continue your inhalers, including your Symbicort, for your cough and shortness of breath. ? ?Be sure to rinse your mouth thoroughly, or brush her teeth, after you use your Symbicort. ? ?Make a follow-up appointment with your primary care provider for reevaluation or treatment of continued symptoms. ?

## 2021-11-03 NOTE — ED Triage Notes (Signed)
Patient is here for "Rash, Sores in mouth". Also, recently here with "COVID19" and "COPD is flaring up". No fever. Some SOB. "Mainly here for mouth, also still can't taste, real congested".  ?

## 2021-11-03 NOTE — ED Provider Notes (Signed)
[facility]    CSN: [id number] Arrival date & time: 11/03/21  0802      History   Chief Complaint Chief Complaint  Patient presents with   Mouth Lesions   Follow-up    HPI Katherine Carter is a 71 y.o. female.   HPI  71 year old female here for evaluation of multiple complaints.  Patient was evaluated 10 days ago in this urgent care and diagnosed with COVID and put on Paxlovid, Tessalon Perles, and prednisone.  9 days prior to that she was evaluated for a COPD exacerbation and was treated with prednisone, Tessalon Perles, Levaquin, and Flonase.  Today complaining of sores in her mouth that started yesterday.  She reports reports that they burn and when she tried to use an over-the-counter peroxide rinse recommended by the pharmacist that caused more burning.  She is also complaining of burning lesions on her tongue and decreased ability to taste.  She has continued nasal congestion with clear nasal discharge, shortness of breath and wheezing, and occasional productive cough for clear sputum.  She denies any fever or ear pain.    Past Medical History:  Diagnosis Date   COPD (chronic obstructive pulmonary disease) ([place])    Diabetes mellitus without complication (HCC)    GERD (gastroesophageal reflux disease)    Hypertension     Patient Active Problem List   Diagnosis Date Noted   Pure hypercholesterolemia    Elevated troponin    Acute respiratory failure with hypoxemia ([place]) 09/02/2021   Sepsis ([place]) 07/08/2018   CAP (community acquired pneumonia) 07/08/2018   Diabetes ([place]) 07/08/2018   HTN (hypertension) 07/08/2018   COPD exacerbation ([place]) 07/08/2018   Acute respiratory failure with hypoxia and hypercapnia ([place]) 12/10/2015    Past Surgical History:  Procedure Laterality Date   ABDOMINAL HYSTERECTOMY     COLONOSCOPY WITH PROPOFOL N/A 01/23/2017   Procedure: COLONOSCOPY WITH PROPOFOL;  Surgeon: Lollie Sails, MD;  [facility]: [facility] END[facility]COPY;   Service: Endoscopy;  Laterality: N/A;   FOOT SURGERY      OB History   No obstetric history on file.      Home Medications    Prior to Admission medications   Medication Sig Start Date End Date Taking? Authorizing Provider  acetaminophen (TYLENOL) 325 MG tablet Take 2 tablets (650 mg total) by mouth every 6 (six) hours as needed for mild pain (or Fever >/= 101). 12/13/15  Yes Gouru, Aruna, MD  amLODipine (NORVASC) 5 MG tablet Take 1 tablet (5 mg total) by mouth daily. 09/05/21  Yes Hosie Poisson, MD  aspirin EC 81 MG tablet Take 81 mg by mouth daily.   Yes [provider]  atorvastatin (LIPITOR) 40 MG tablet Take 1 tablet (40 mg total) by mouth every evening. 09/05/21  Yes Hosie Poisson, MD  benzonatate (TESSALON) 100 MG capsule Take 2 capsules (200 mg total) by mouth every 8 (eight) hours. 11/03/21  Yes Margarette Canada, NP  budesonide-formoterol [facility]) 160-4.5 MCG/ACT inhaler Inhale 2 puffs into the lungs 2 (two) times daily.   Yes [provider]  insulin aspart (NOVOLOG) 100 UNIT/ML injection CBG 70 - 120: 0 units  CBG 121 - 150: 1 unit  CBG 151 - 200: 2 units  CBG 201 - 250: 3 units  CBG 251 - 300: 5 units  CBG 301 - 350: 7 units  CBG 351 - 400: 9 units 09/05/21  Yes Hosie Poisson, MD  insulin glargine-yfgn (SEMGLEE) 100 UNIT/ML injection Inject 0.2 mLs (20 Units total)  into the skin daily. 09/06/21  Yes Hosie Poisson, MD  losartan (COZAAR) 50 MG tablet Take 100 mg by mouth daily.   Yes [provider]  metFORMIN (GLUCOPHAGE-XR) 500 MG 24 hr tablet Take 500 mg by mouth daily with breakfast.   Yes [provider]  nystatin (MYCOSTATIN) 100000 UNIT/ML suspension Take 5 mLs (500,000 Units total) by mouth 4 (four) times daily. 11/03/21  Yes Margarette Canada, NP  pantoprazole (PROTONIX) 40 MG tablet Take 1 tablet (40 mg total) by mouth daily. 06/11/21  Yes Coral Spikes, DO  Spacer/Aero-Holding Chambers (AEROCHAMBER PLUS) inhaler Use with inhaler 10/15/21  Yes  Melynda Ripple, MD  ACCU-CHEK GUIDE test strip 1 each daily. 09/07/21   [provider]  Accu-Chek Softclix Lancets lancets SMARTSIG:Topical 09/07/21   [provider]  azelastine (ASTELIN) 0.1 % nasal spray SPRAY 1 SPRAY INTO EACH NOSTRIL 2 TIMES DAILY. 07/19/21   [provider]  Blood Glucose Monitoring Suppl (ACCU-CHEK GUIDE) w/Device KIT USE 1 DEVICE AS DIRECTED THREE TIMES A DAY FOR GLUCOSE MONITORING 09/07/21   [provider]  diphenhydrAMINE (BENADRYL) 25 mg capsule Allergy Relief (diphenhydramine) 25 mg capsule  TAKE 1 TO 2 CAPSULES BY MOUTH EVERY 4 TO 6 HOURS AS NEEDED FOR SWELLING/ALLERGY    [provider]  fluticasone (FLONASE) 50 MCG/ACT nasal spray Place into the nose. 07/19/21   [provider]  Fluticasone Furoate-Vilanterol (BREO ELLIPTA IN) Breo Ellipta 100 mcg-25 mcg/dose powder for inhalation    [provider]  HUMALOG 100 UNIT/ML injection Inject into the skin as directed. 09/05/21   [provider]  hydrochlorothiazide (MICROZIDE) 12.5 MG capsule Take 12.5 mg by mouth daily. 09/22/21   [provider]  ipratropium-albuterol (DUONEB) 0.5-2.5 (3) MG/3ML SOLN Take 3 mLs by nebulization every 4 (four) hours as needed. Patient taking differently: Take 3 mLs by nebulization every 4 (four) hours as needed. Last dose: 730 am. 09/05/21   Hosie Poisson, MD  JARDIANCE 10 MG TABS tablet Take 10 mg by mouth daily. 09/07/21   [provider]  Lancets MISC SMARTSIG:Topical 09/07/21   [provider]  latanoprost (XALATAN) 0.005 % ophthalmic solution SMARTSIG:In Eye(s) 11/01/21   [provider]  meloxicam (MOBIC) 7.5 MG tablet Take 7.5 mg by mouth daily. 10/07/21   [provider]  methocarbamol (ROBAXIN) 500 MG tablet Take by mouth. 06/21/21   [provider]  MIRALAX 17 g packet  06/23/21   [provider]  predniSONE (DELTASONE) 5 MG tablet prednisone 5 mg tablet     [provider]  Respiratory Therapy Supplies ([place]) [place] See admin instructions. use with inhaler 10/17/21   [provider]  tiotropium (SPIRIVA HANDIHALER) 18 MCG inhalation capsule Place 1 capsule (18 mcg total) into inhaler and inhale daily. 09/05/21   Hosie Poisson, MD  tiZANidine (ZANAFLEX) 4 MG tablet tizanidine 4 mg tablet    [provider]  XOPENEX HFA 45 MCG/ACT inhaler Inhale into the lungs. 11/02/21   [provider]    Family History Family History  Problem Relation Age of Onset   CAD Mother    CAD Father    Breast cancer Neg Hx     Social History Social History   Tobacco Use   Smoking status: Former    Packs/day: 0.50    Years: 25.00    Pack years: 12.50    Types: Cigarettes    Quit date: 07/06/2014    Years since quitting: 7.3  Smokeless tobacco: Never  Vaping Use   Vaping Use: Never used  Substance Use Topics   Alcohol use: No    Alcohol/week: 0.0 standard drinks   Drug use: No     Allergies   Penicillins   Review of Systems Review of Systems  Constitutional:  Negative for fever.  HENT:  Positive for congestion, mouth sores and rhinorrhea. Negative for ear pain.   Respiratory:  Positive for cough, shortness of breath and wheezing.   Hematological: Negative.   Psychiatric/Behavioral: Negative.      Physical Exam Triage Vital Signs ED Triage Vitals  Enc Vitals Group     BP 11/03/21 0815 140/71     Pulse Rate 11/03/21 0815 (!) 104     Resp 11/03/21 0815 (!) 24     Temp 11/03/21 0815 98.4 F (36.9 C)     Temp Source 11/03/21 0815 Oral     SpO2 11/03/21 0815 96 %     Weight 11/03/21 0813 169 lb 15.6 oz (77.1 kg)     Height --      Head Circumference --      Peak Flow --      Pain Score 11/03/21 0812 8     Pain Loc --      Pain Edu? --      Excl. in [place]? --    No data found.  Updated Vital Signs BP 140/71 (BP Location: Left Arm)    Pulse (!) 104    Temp 98.4 F (36.9 C) (Oral)    Resp (!) 24     Wt 169 lb 15.6 oz (77.1 kg)    SpO2 96%    BMI 30.11 kg/m   Visual Acuity Right Eye Distance:   Left Eye Distance:   Bilateral Distance:    Right Eye Near:   Left Eye Near:    Bilateral Near:     Physical Exam   UC Treatments / Results  Labs (all labs ordered are listed, but only abnormal results are displayed) Labs Reviewed - No data to display  EKG   Radiology DG Chest 2 View  Result Date: 11/03/2021 CLINICAL DATA:  Shortness of breath, cough. EXAM: CHEST - 2 VIEW COMPARISON:  October 24, 2021. FINDINGS: The heart size and mediastinal contours are within normal limits. Both lungs are clear. Hyperexpansion of the lungs is noted. The visualized skeletal structures are unremarkable. IMPRESSION: No active cardiopulmonary disease. Aortic Atherosclerosis (ICD10-I70.0) and Emphysema (ICD10-J43.9). Electronically Signed   By: Marijo Conception M.D.   On: 11/03/2021 09:08    Procedures Procedures (including critical care time)  Medications Ordered in UC Medications - No data to display  Initial Impression / Assessment and Plan / UC Course  I have reviewed the triage vital signs and the nursing notes.  Pertinent labs & imaging results that were available during my care of the patient were reviewed by me and considered in my medical decision making (see chart for details).  Nontoxic-appearing 71 year old female here for evaluation of mouth lesions as well as continued shortness of breath and cough.  She has a history of COPD and has been treated twice in the last month for COPD exacerbation, the second exacerbation was also with concomitant COVID which was 7 days ago.  She first noticed the lesions on her inner lips and tongue last night and she states that the burning nature.  She has been on prednisone twice in the past month for COPD.  She is also complaining of  some clear nasal discharge and occasional productive cough for clear sputum.  On exam patient does have white lacy lesions  on the inner surface of her upper lip and cheeks.  She has a few lesions on her lower lip.  Additionally, she has some white patches on her tongue.  The remainder of her respiratory tree reveals pearly-gray tympanic membranes bilaterally with normal light reflex and clear external auditory canals.  Nasal mucosa is mildly erythematous and edematous with scant clear discharge in both nares.  No cervical lymphadenopathy appreciable exam.  Cardiopulmonary exam reveals clear but decreased lung sounds in all fields.  I will obtain a chest x-ray to look for any infiltrates or pneumonia.  I suspect patient's continued cough is just a result of the inflammatory process caused by COVID.  The oral lesions I suspect are thrush as result of her recent prednisone therapy.  I will place her on nystatin swish and swallow for treatment of oral thrush.  She also is on Symbicort and states that she does not rinse her mouth every time after use.  Chest x-ray independently reviewed and evaluated by me.  Impression: The costophrenic angles appear blunted on the AP.  Pulmonary vasculature is prominent without any defined infiltrate or effusion.  Radiology overread is pending. Radiology impression states heart size and mediastinal contours are within normal limits and both lungs are clear.  Hyperexpansion of lungs is noted.  The visualized, structures are unremarkable.  No active cardiopulmonary disease.  We will discharge patient home and encouraged her to continue to use her Symbicort as prescribed.  I we will refill her Ladona Ridgel that she states these have been helping her.  I will also place her on nystatin swish and swallow for treatment of oral thrush.     Final Clinical Impressions(s) / UC Diagnoses   Final diagnoses:  Oral thrush  COPD exacerbation ([place])     Discharge Instructions      For your oral thrush take the nystatin swish and swallow 4 times a day for the next 10 days.  If you are still having  issues please continue for an additional 4 days.  Use the Tessalon Perles every 8 hours as needed for cough.  Continue your inhalers, including your Symbicort, for your cough and shortness of breath.  Be sure to rinse your mouth thoroughly, or brush her teeth, after you use your Symbicort.  Make a follow-up appointment with your primary care provider for reevaluation or treatment of continued symptoms.     ED Prescriptions     Medication Sig Dispense Auth. Provider   nystatin (MYCOSTATIN) 100000 UNIT/ML suspension Take 5 mLs (500,000 Units total) by mouth 4 (four) times daily. 60 mL Margarette Canada, NP   benzonatate (TESSALON) 100 MG capsule Take 2 capsules (200 mg total) by mouth every 8 (eight) hours. 21 capsule Margarette Canada, NP      PDMP not reviewed this encounter.   Margarette Canada, NP 11/03/21 [phone]

## 2021-11-03 NOTE — ED Triage Notes (Signed)
No med list with her today. Meds "same as last time".  ?

## 2021-12-20 ENCOUNTER — Ambulatory Visit
Admission: EM | Admit: 2021-12-20 | Discharge: 2021-12-20 | Disposition: A | Discharge status: Home / Self Care | Payer: Medicare Other | Attending: Internal Medicine | Admitting: Internal Medicine

## 2021-12-20 DIAGNOSIS — J441 Chronic obstructive pulmonary disease with (acute) exacerbation: Secondary | ICD-10-CM

## 2021-12-20 MED ORDER — PREDNISONE 50 MG PO TABS: 50.0000 mg | ORAL_TABLET | Freq: Every day | ORAL | 0 refills | Status: DC

## 2021-12-20 MED ORDER — GUAIATUSSIN AC 100-10 MG/5ML PO SYRP: 5.0000 mL | ORAL_SOLUTION | Freq: Two times a day (BID) | ORAL | 0 refills | Status: DC | PRN

## 2021-12-20 NOTE — ED Triage Notes (Signed)
Pt has COPD and states that she has been coughing and has a lot of congestion x3days. ?

## 2021-12-20 NOTE — Discharge Instructions (Signed)
Prescriptions for prednisone burst (steroid) and for guaiatussin AC cough syrup were sent to the pharmacy.  Anticipate gradual improvement in chest congestion and cough over the next several days.  Push fluids and rest.  Take tylenol or advil otc as needed for fever, discomfort.  Eat fruits and vegetables to help your immune system do its best work.  Anticipate gradual improvement over the next several days.  Recheck for new fever >100.5, increasing phlegm production/nasal discharge, or if not starting to improve in a few days.   ? ?

## 2021-12-20 NOTE — ED Provider Notes (Signed)
?[facility] ? ? ? ?CSN: [id number] ?Arrival date & time: 12/20/21  9728 ? ? ?  ? ?History   ?Chief Complaint ?Chief Complaint  ?Patient presents with  ? Cough  ? Nasal Congestion  ? ? ?HPI ?Katherine Carter is a 71 y.o. female. Patient with severe COPD, follows with Dr Raul Del.  Has home O2.  Presents today with 2-3 days history increasing cough and dyspnea.  No fever.  Little bit of runny/congested nose.  Not vomiting, no diarrhea.  No malaise.   ? ? ?Cough ? ?Past Medical History:  ?Diagnosis Date  ? COPD (chronic obstructive pulmonary disease) ([place])   ? Diabetes mellitus without complication ([place])   ? GERD (gastroesophageal reflux disease)   ? Hypertension   ? ? ?Patient Active Problem List  ? Diagnosis Date Noted  ? Pure hypercholesterolemia   ? Elevated troponin   ? Acute respiratory failure with hypoxemia ([place]) 09/02/2021  ? Sepsis ([place]) 07/08/2018  ? CAP (community acquired pneumonia) 07/08/2018  ? Diabetes ([place]) 07/08/2018  ? HTN (hypertension) 07/08/2018  ? COPD exacerbation ([place]) 07/08/2018  ? Acute respiratory failure with hypoxia and hypercapnia ([place]) 12/10/2015  ? ? ?Past Surgical History:  ?Procedure Laterality Date  ? ABDOMINAL HYSTERECTOMY    ? COLONOSCOPY WITH PROPOFOL N/A 01/23/2017  ? Procedure: COLONOSCOPY WITH PROPOFOL;  Surgeon: Lollie Sails, MD;  Location: [facility] ENDOSCOPY;  Service: Endoscopy;  Laterality: N/A;  ? FOOT SURGERY    ? ? ? ?Home Medications   ? ?Prior to Admission medications   ?Medication Sig Start Date End Date Taking? Authorizing Provider  ?ACCU-CHEK GUIDE test strip 1 each daily. 09/07/21  Yes [provider]  ?Accu-Chek Softclix Lancets lancets SMARTSIG:Topical 09/07/21  Yes [provider]  ?amLODipine (NORVASC) 5 MG tablet Take 1 tablet (5 mg total) by mouth daily. 09/05/21  Yes Hosie Poisson, MD  ?atorvastatin (LIPITOR) 40 MG tablet Take 1 tablet (40 mg total) by mouth every evening. 09/05/21  Yes Hosie Poisson, MD  ?azelastine (ASTELIN) 0.1  % nasal spray SPRAY 1 SPRAY INTO EACH NOSTRIL 2 TIMES DAILY. 07/19/21  Yes [provider]  ?benzonatate (TESSALON) 100 MG capsule Take 2 capsules (200 mg total) by mouth every 8 (eight) hours. 11/03/21  Yes Margarette Canada, NP  ?Blood Glucose Monitoring Suppl (ACCU-CHEK GUIDE) w/Device KIT USE 1 DEVICE AS DIRECTED THREE TIMES A DAY FOR GLUCOSE MONITORING 09/07/21  Yes [provider]  ?budesonide-formoterol (SYMBICORT) 160-4.5 MCG/ACT inhaler Inhale 2 puffs into the lungs 2 (two) times daily.   Yes [provider]  ?fluticasone (FLONASE) 50 MCG/ACT nasal spray Place into the nose. 07/19/21  Yes [provider]  ?Fluticasone Furoate-Vilanterol (BREO ELLIPTA IN) Breo Ellipta 100 mcg-25 mcg/dose powder for inhalation   Yes [provider]  ?guaiFENesin-codeine (GUAIATUSSIN AC) 100-10 MG/5ML syrup Take 5 mLs by mouth 2 (two) times daily as needed for cough. 12/20/21  Yes Wynona Luna, MD  ?hydrochlorothiazide (MICROZIDE) 12.5 MG capsule Take 12.5 mg by mouth daily. 09/22/21  Yes [provider]  ?ipratropium-albuterol (DUONEB) 0.5-2.5 (3) MG/3ML SOLN Take 3 mLs by nebulization every 4 (four) hours as needed. ?Patient taking differently: Take 3 mLs by nebulization every 4 (four) hours as needed. Last dose: 730 am. 09/05/21  Yes Hosie Poisson, MD  ?JARDIANCE 10 MG TABS tablet Take 10 mg by mouth daily. 09/07/21  Yes [provider]  ?Lancets MISC SMARTSIG:Topical 09/07/21  Yes [provider]  ?latanoprost (XALATAN) 0.005 % ophthalmic solution SMARTSIG:In  Eye(s) 11/01/21  Yes [provider]  ?losartan (COZAAR) 50 MG tablet Take 100 mg by mouth daily.   Yes [provider]  ?meloxicam (MOBIC) 7.5 MG tablet Take 7.5 mg by mouth daily. 10/07/21  Yes [provider]  ?metFORMIN (GLUCOPHAGE-XR) 500 MG 24 hr tablet Take 500 mg by mouth daily with breakfast.   Yes [provider]  ?methocarbamol (ROBAXIN) 500 MG tablet Take by  mouth. 06/21/21  Yes [provider]  ?MIRALAX 17 g packet  06/23/21  Yes [provider]  ?nystatin (MYCOSTATIN) 100000 UNIT/ML suspension Take 5 mLs (500,000 Units total) by mouth 4 (four) times daily. 11/03/21  Yes Margarette Canada, NP  ?pantoprazole (PROTONIX) 40 MG tablet Take 1 tablet (40 mg total) by mouth daily. 06/11/21  Yes Cook, Jayce G, DO  ?predniSONE (DELTASONE) 5 MG tablet prednisone 5 mg tablet   Yes [provider]  ?predniSONE (DELTASONE) 50 MG tablet Take 1 tablet (50 mg total) by mouth daily. 12/20/21  Yes Wynona Luna, MD  ?Respiratory Therapy Supplies (ADULT MASK LARGE) [place] See admin instructions. use with inhaler 10/17/21  Yes [provider]  ?Spacer/Aero-Holding Chambers (AEROCHAMBER PLUS) inhaler Use with inhaler 10/15/21  Yes Melynda Ripple, MD  ?tiotropium (SPIRIVA HANDIHALER) 18 MCG inhalation capsule Place 1 capsule (18 mcg total) into inhaler and inhale daily. 09/05/21  Yes Hosie Poisson, MD  ?tiZANidine (ZANAFLEX) 4 MG tablet tizanidine 4 mg tablet   Yes [provider]  ?XOPENEX HFA 45 MCG/ACT inhaler Inhale into the lungs. 11/02/21  Yes [provider]  ? ? ?Family History ?Family History  ?Problem Relation Age of Onset  ? CAD Mother   ? CAD Father   ? Breast cancer Neg Hx   ? ? ?Social History ?Social History  ? ?Tobacco Use  ? Smoking status: Former  ?  Packs/day: 0.50  ?  Years: 25.00  ?  Pack years: 12.50  ?  Types: Cigarettes  ?  Quit date: 07/06/2014  ?  Years since quitting: 7.4  ? Smokeless tobacco: Never  ?Vaping Use  ? Vaping Use: Never used  ?Substance Use Topics  ? Alcohol use: No  ?  Alcohol/week: 0.0 standard drinks  ? Drug use: No  ? ? ? ?Allergies   ?Penicillins ? ? ?Review of Systems ?Review of Systems  ?Respiratory:  Positive for cough.    See HPI ? ? ?Physical Exam ?Triage Vital Signs ?ED Triage Vitals  ?Enc Vitals Group  ?   BP 12/20/21 1147 134/70  ?   Pulse Rate 12/20/21 1147 (!) 114  ?   Resp 12/20/21 1147 (!)  22  ?   Temp 12/20/21 1147 99.5 ?F (37.5 ?C)  ?   Temp Source 12/20/21 1147 Oral  ?   SpO2 12/20/21 1147 92 %  ?   Weight 12/20/21 1145 170 lb (77.1 kg)  ?   Height 12/20/21 1145 '5\' 3"'  (1.6 m)  ?   Pain Score 12/20/21 1145 0  ?   Pain Loc --   ?Review of previous visits-->mildly tachycardic at an acute care visit a month ago ? ?Updated Vital Signs ?BP 134/70 (BP Location: Left Arm)   Pulse (!) 114   Temp 99.5 ?F (37.5 ?C) (Oral)   Resp (!) 22   Ht '5\' 3"'  (1.6 m)   Wt 77.1 kg   SpO2 92%   BMI 30.11 kg/m?  ? ?Physical Exam ?Constitutional:   ?   General: She is not in acute  distress. ?   Appearance: She is ill-appearing. She is not toxic-appearing.  ?   Comments: Good hygiene, pleasant. ?Looks slightly breathless at rest, but able to speak.  Some coughing during exam.  ?HENT:  ?   Head: Atraumatic.  ?   Comments: B TMs mod dull, no erythema ?Marked nasal congestion bilaterally ?Posterior pharynx benign ?   Mouth/Throat:  ?   Mouth: Mucous membranes are moist.  ?Eyes:  ?   Conjunctiva/sclera:  ?   Right eye: Right conjunctiva is not injected. No exudate. ?   Left eye: Left conjunctiva is not injected. No exudate. ?   Comments: Conjugate gaze observed  ?Cardiovascular:  ?   Rate and Rhythm: Regular rhythm. Tachycardia present.  ?Pulmonary:  ?   Breath sounds: Wheezing present. No rhonchi.  ?   Comments: Diffuse wheezing with mild splinting, looks mildly breathless at rest ?Abdominal:  ?   General: There is no distension.  ?Musculoskeletal:  ?   Cervical back: Neck supple.  ?   Comments: Walked into urgent care independently, no assistive device, no O2  ?Skin: ?   General: Skin is warm and dry.  ?   Comments: No cyanosis  ?Neurological:  ?   Mental Status: She is alert.  ?   Comments: Face symmetric, speech clear/coherent/logical.  Speaking in complete but short sentences  ? ? ? ?UC Treatments / Results  ?Labs ?(all labs ordered are listed, but only abnormal results are displayed) ?Labs Reviewed - No data to  display ?NA ? ?EKG ?NA ? ?Radiology ?No results found. ?NA ? ?Procedures ?Procedures (including critical care time) ?NA ? ?Medications Ordered in UC ?Medications - No data to display ?NA ? ?Initial Impression / Ass

## 2022-05-28 ENCOUNTER — Ambulatory Visit (INDEPENDENT_AMBULATORY_CARE_PROVIDER_SITE_OTHER): Payer: Medicare Other

## 2022-05-28 ENCOUNTER — Encounter: Payer: Self-pay | Admitting: Emergency Medicine

## 2022-05-28 ENCOUNTER — Ambulatory Visit
Admission: EM | Admit: 2022-05-28 | Discharge: 2022-05-28 | Disposition: A | Discharge status: Home / Self Care | Payer: Medicare Other | Attending: Emergency Medicine | Admitting: Emergency Medicine

## 2022-05-28 DIAGNOSIS — U071 COVID-19: Secondary | ICD-10-CM | POA: Diagnosis not present

## 2022-05-28 DIAGNOSIS — R059 Cough, unspecified: Secondary | ICD-10-CM | POA: Diagnosis not present

## 2022-05-28 LAB — SARS CORONAVIRUS 2 BY RT PCR: SARS Coronavirus 2 by RT PCR: POSITIVE — AB

## 2022-05-28 MED ORDER — IPRATROPIUM BROMIDE 0.06 % NA SOLN: 2.0000 | Freq: Four times a day (QID) | NASAL | 12 refills | Status: DC

## 2022-05-28 MED ORDER — NIRMATRELVIR/RITONAVIR (PAXLOVID)TABLET: 3.0000 | ORAL_TABLET | Freq: Two times a day (BID) | ORAL | 0 refills | Status: AC

## 2022-05-28 MED ORDER — BENZONATATE 100 MG PO CAPS: 200.0000 mg | ORAL_CAPSULE | Freq: Three times a day (TID) | ORAL | 0 refills | Status: DC

## 2022-05-28 MED ORDER — GUAIFENESIN-CODEINE 100-10 MG/5ML PO SYRP: 5.0000 mL | ORAL_SOLUTION | Freq: Three times a day (TID) | ORAL | 0 refills | Status: DC | PRN

## 2022-05-28 NOTE — ED Provider Notes (Signed)
[facility]    CSN: [id number] Arrival date & time: 05/28/22  0810      History   Chief Complaint Chief Complaint  Patient presents with   Cough    HPI Katherine Carter is a 71 y.o. female.   HPI  71 year old female here for evaluation of cough.  Patient reports that she has been experiencing a cough for the last 3 days that is intermittently productive for clear sputum.  She endorses having a sore throat on Friday but states that that has resolved.  She has had some nasal congestion as well as ear pain.  Also some mild diarrhea.  She has not measured a fever at home and she denies shortness breath or wheezing.  No nausea or vomiting.  No known sick contacts.  Patient has a significant past medical history to include acute respiratory failure with hypoxemia, COPD, hypertension, diabetes, sepsis, and CAP.  Patient refusing COVID test at triage.  Past Medical History:  Diagnosis Date   COPD (chronic obstructive pulmonary disease) ([place])    Diabetes mellitus without complication ([facility])    GERD (gastroesophageal reflux disease)    Hypertension     Patient Active Problem List   Diagnosis Date Noted   Pure hypercholesterolemia    Elevated troponin    Acute respiratory failure with hypoxemia ([place]) 09/02/2021   Sepsis ([place]) 07/08/2018   CAP (community acquired pneumonia) 07/08/2018   Diabetes ([place]) 07/08/2018   HTN (hypertension) 07/08/2018   COPD exacerbation ([place]) 07/08/2018   Acute respiratory failure with hypoxia and hypercapnia ([place]) 12/10/2015    Past Surgical History:  Procedure Laterality Date   ABDOMINAL HYSTERECTOMY     COLONOSCOPY WITH PROPOFOL N/A 01/23/2017   Procedure: COLONOSCOPY WITH PROPOFOL;  Surgeon: Lollie Sails, MD;  Location: [facility] ENDOSCOPY;  Service: Endoscopy;  Laterality: N/A;   FOOT SURGERY      OB History   No obstetric history on file.      Home Medications    Prior to Admission medications   Medication Sig Start  Date End Date Taking? Authorizing Provider  amLODipine (NORVASC) 5 MG tablet Take 1 tablet (5 mg total) by mouth daily. 09/05/21  Yes Hosie Poisson, MD  atorvastatin (LIPITOR) 40 MG tablet Take 1 tablet (40 mg total) by mouth every evening. 09/05/21  Yes Hosie Poisson, MD  benzonatate (TESSALON) 100 MG capsule Take 2 capsules (200 mg total) by mouth every 8 (eight) hours. 05/28/22  Yes Margarette Canada, NP  budesonide-formoterol [facility]) 160-4.5 MCG/ACT inhaler Inhale 2 puffs into the lungs 2 (two) times daily.   Yes [provider]  guaiFENesin-codeine (ROBITUSSIN AC) 100-10 MG/5ML syrup Take 5 mLs by mouth 3 (three) times daily as needed for cough. 05/28/22  Yes Margarette Canada, NP  hydrochlorothiazide (MICROZIDE) 12.5 MG capsule Take 12.5 mg by mouth daily. 09/22/21  Yes [provider]  ipratropium (ATROVENT) 0.06 % nasal spray Place 2 sprays into both nostrils 4 (four) times daily. 05/28/22  Yes Margarette Canada, NP  JARDIANCE 10 MG TABS tablet Take 10 mg by mouth daily. 09/07/21  Yes [provider]  latanoprost (XALATAN) 0.005 % ophthalmic solution SMARTSIG:In Eye(s) 11/01/21  Yes [provider]  losartan (COZAAR) 50 MG tablet Take 100 mg by mouth daily.   Yes [provider]  metFORMIN (GLUCOPHAGE-XR) 500 MG 24 hr tablet Take 500 mg by mouth daily with breakfast.   Yes [provider]  nirmatrelvir/ritonavir EUA (PAXLOVID) 20 x 150 MG & 10 x 100MG  TABS Take 3 tablets by mouth 2 (two) times daily for 5 days. 05/28/22 06/02/22 Yes Margarette Canada, NP  tiotropium (SPIRIVA HANDIHALER) 18 MCG inhalation capsule Place 1 capsule (18 mcg total) into inhaler and inhale daily. 09/05/21  Yes Hosie Poisson, MD  XOPENEX HFA 45 MCG/ACT inhaler Inhale into the lungs. 11/02/21  Yes [provider]  ACCU-CHEK GUIDE test strip 1 each daily. 09/07/21   [provider]  Accu-Chek Softclix Lancets lancets SMARTSIG:Topical 09/07/21   [provider]  aspirin EC 81  MG tablet Take 81 mg by mouth daily. 03/15/22   [provider]  azelastine (ASTELIN) 0.1 % nasal spray SPRAY 1 SPRAY INTO EACH NOSTRIL 2 TIMES DAILY. 07/19/21   [provider]  Blood Glucose Monitoring Suppl (ACCU-CHEK GUIDE) w/Device KIT USE 1 DEVICE AS DIRECTED THREE TIMES A DAY FOR GLUCOSE MONITORING 09/07/21   [provider]  fluticasone (FLONASE) 50 MCG/ACT nasal spray Place into the nose. 07/19/21   [provider]  Fluticasone Furoate-Vilanterol (BREO ELLIPTA IN) Breo Ellipta 100 mcg-25 mcg/dose powder for inhalation    [provider]  ipratropium-albuterol (DUONEB) 0.5-2.5 (3) MG/3ML SOLN Take 3 mLs by nebulization every 4 (four) hours as needed. Patient taking differently: Take 3 mLs by nebulization every 4 (four) hours as needed. Last dose: 730 am. 09/05/21   Hosie Poisson, MD  Lancets MISC SMARTSIG:Topical 09/07/21   [provider]  meloxicam (MOBIC) 7.5 MG tablet Take 7.5 mg by mouth daily. 10/07/21   [provider]  methocarbamol (ROBAXIN) 500 MG tablet Take by mouth. 06/21/21   [provider]  MIRALAX 17 g packet  06/23/21   [provider]  nystatin (MYCOSTATIN) 100000 UNIT/ML suspension Take 5 mLs (500,000 Units total) by mouth 4 (four) times daily. 11/03/21   Margarette Canada, NP  OZEMPIC, 0.25 OR 0.5 MG/DOSE, 2 MG/3ML SOPN Inject into the skin. 04/26/22   [provider]  pantoprazole (PROTONIX) 40 MG tablet Take 1 tablet (40 mg total) by mouth daily. 06/11/21   Coral Spikes, DO  Respiratory Therapy Supplies (ADULT MASK LARGE) MISC See admin instructions. use with inhaler 10/17/21   [provider]  Spacer/Aero-Holding Chambers (AEROCHAMBER PLUS) inhaler Use with inhaler 10/15/21   Melynda Ripple, MD  tiZANidine (ZANAFLEX) 4 MG tablet tizanidine 4 mg tablet    [provider]    Family History Family History  Problem Relation Age of Onset   CAD Mother    CAD Father    Breast  cancer Neg Hx     Social History Social History   Tobacco Use   Smoking status: Former    Packs/day: 0.50    Years: 25.00    Total pack years: 12.50    Types: Cigarettes    Quit date: 07/06/2014    Years since quitting: 7.8   Smokeless tobacco: Never  Vaping Use   Vaping Use: Never used  Substance Use Topics   Alcohol use: No    Alcohol/week: 0.0 standard drinks of alcohol   Drug use: No     Allergies   Penicillins   Review of Systems Review of Systems  Constitutional:  Negative for fever.  HENT:  Positive for congestion and sore throat. Negative for rhinorrhea.   Respiratory:  Positive for cough. Negative for shortness of breath and wheezing.   Gastrointestinal:  Positive for diarrhea. Negative for nausea and vomiting.  Skin:  Negative for rash.  Hematological: Negative.   Psychiatric/Behavioral: Negative.  Physical Exam Triage Vital Signs ED Triage Vitals  Enc Vitals Group     BP      Pulse      Resp      Temp      Temp src      SpO2      Weight      Height      Head Circumference      Peak Flow      Pain Score      Pain Loc      Pain Edu?      Excl. in [place]?    No data found.  Updated Vital Signs BP 124/89 (BP Location: Right Arm)   Pulse (!) 105   Temp 98.4 F (36.9 C) (Oral)   Resp 15   Ht _0  (1.6 m)   Wt 167 lb (75.8 kg)   SpO2 95%   BMI 29.58 kg/m   Visual Acuity Right Eye Distance:   Left Eye Distance:   Bilateral Distance:    Right Eye Near:   Left Eye Near:    Bilateral Near:     Physical Exam Vitals and nursing note reviewed.  Constitutional:      Appearance: Normal appearance. She is not ill-appearing.  HENT:     Head: Normocephalic and atraumatic.     Right Ear: Tympanic membrane, ear canal and external ear normal. There is no impacted cerumen.     Left Ear: Tympanic membrane, ear canal and external ear normal. There is no impacted cerumen.     Nose: Congestion and rhinorrhea present.     Mouth/Throat:      Mouth: Mucous membranes are moist.     Pharynx: Oropharynx is clear. Posterior oropharyngeal erythema present. No oropharyngeal exudate.  Cardiovascular:     Rate and Rhythm: Normal rate and regular rhythm.     Pulses: Normal pulses.     Heart sounds: Normal heart sounds. No murmur heard.    No friction rub. No gallop.  Pulmonary:     Effort: Pulmonary effort is normal.     Breath sounds: Normal breath sounds. No wheezing, rhonchi or rales.  Musculoskeletal:     Cervical back: Normal range of motion and neck supple.  Lymphadenopathy:     Cervical: No cervical adenopathy.  Skin:    General: Skin is warm and dry.     Capillary Refill: Capillary refill takes less than 2 seconds.     Findings: No erythema or rash.  Neurological:     General: No focal deficit present.     Mental Status: She is alert and oriented to person, place, and time.  Psychiatric:        Mood and Affect: Mood normal.        Behavior: Behavior normal.        Thought Content: Thought content normal.        Judgment: Judgment normal.      UC Treatments / Results  Labs (all labs ordered are listed, but only abnormal results are displayed) Labs Reviewed  SARS CORONAVIRUS 2 BY RT PCR - Abnormal; Notable for the following components:      Result Value   SARS Coronavirus 2 by RT PCR POSITIVE (*)    All other components within normal limits    EKG   Radiology DG Chest 2 View  Result Date: 05/28/2022 CLINICAL DATA:  Cough. EXAM: CHEST - 2 VIEW COMPARISON:  November 03, 2021 FINDINGS: Mild bibasilar atelectasis, stable. The heart,  hila, mediastinum, lungs, and pleura are otherwise unremarkable. Flattening of the diaphragm on the lateral view consistent with hyperinflation. Emphysematous changes, particularly in the apices. IMPRESSION: 1. Findings of emphysema. Mild atelectasis in the bases. No other acute interval changes. Electronically Signed   By: Dorise Bullion III M.D.   On: 05/28/2022 08:58     Procedures Procedures (including critical care time)  Medications Ordered in UC Medications - No data to display  Initial Impression / Assessment and Plan / UC Course  I have reviewed the triage vital signs and the nursing notes.  Pertinent labs & imaging results that were available during my care of the patient were reviewed by me and considered in my medical decision making (see chart for details).   Patient is a nontoxic-appearing 71 year old female here for evaluation of 3 days worth of cough that is also associated with ear pain, nasal congestion, and sore throat.  Patient also endorses some mild diarrhea.  Her cough is intermittently productive for clear sputum.  Patient has significant respiratory past medical history and was initially refusing COVID test at triage.  I advised her that it would be in her best interest to be tested for COVID as if she has COVID we can prescribe antivirals that would help keep her out of the hospital given her significant respiratory history.  Patient is consenting.  On exam patient does have erythema and edema of her nasal passages with scant clear rhinorrhea.  Posterior pharynx is also mildly erythematous with clear postnasal drip.  The rest of the patient's upper respiratory exam is benign.  Cardiopulmonary exam feels the lung sounds in all fields.  Given history of COPD I will also order chest x-ray to look for any acute cardiopulmonary process.  COVID PCR is positive.  Radiology impression states finding is emphysema with mild atelectasis in the bases.  No other acute interval changes.  I will discharge patient home with a diagnosis of COVID 19 and placed her on Paxlovid.  Patient has a CMP in epic from 10/24/2021 which shows a GFR of 58 so there is no need to adjust Paxlovid dosing.  I will also discharge her home on Atrovent nasal spray, Tessalon Perles, and Cheratussin cough syrup that she can use at bedtime as needed.   Final Clinical  Impressions(s) / UC Diagnoses   Final diagnoses:  [id number]     Discharge Instructions      You have tested positive for COVID-19 today.  You will need to quarantine for 5 days from onset of symptoms.  After 5 days you can break quarantine if your symptoms have improved and you have not had a fever for 24 hours without taking Tylenol or ibuprofen.  You will need to wear a mask around other people for additional 5 days.  Take Paxlovid twice daily for 5 days for treatment of COVID-19.  Use the Atrovent nasal spray, 2 squirts up each nostril every 6 hours, as needed for nasal congestion, runny nose, and postnasal drip.  Use the Tessalon Perles every 8 hours during the day as needed for cough.  Take them with a small sip of water.  You may get some numbness to the base of your tongue or metallic taste in mouth, this is normal.  Use the Cheratussin at bedtime as needed for cough and congestion.  Be mindful of this medication will make you drowsy so if you have to get up to sleep at the middle night please make sure that you have  your bearings about you before you try and walk to the bathroom to prevent falls.  If you develop any shortness of breath, especially at rest, feel like you cannot catch your breath, you are unable to speak in full sentences, or is a late sign your lips are turning blue you need to call 911 and go to the emergency department.     ED Prescriptions     Medication Sig Dispense Auth. Provider   benzonatate (TESSALON) 100 MG capsule Take 2 capsules (200 mg total) by mouth every 8 (eight) hours. 21 capsule Margarette Canada, NP   guaiFENesin-codeine (ROBITUSSIN AC) 100-10 MG/5ML syrup Take 5 mLs by mouth 3 (three) times daily as needed for cough. 120 mL Margarette Canada, NP   nirmatrelvir/ritonavir EUA (PAXLOVID) 20 x 150 MG & 10 x 100MG TABS Take 3 tablets by mouth 2 (two) times daily for 5 days. 30 tablet Margarette Canada, NP   ipratropium (ATROVENT) 0.06 % nasal spray Place 2 sprays  into both nostrils 4 (four) times daily. 15 mL Margarette Canada, NP      I have reviewed the PDMP during this encounter.   Margarette Canada, NP 05/28/22 [phone]

## 2022-05-28 NOTE — Discharge Instructions (Addendum)
You have tested positive for COVID-19 today.  You will need to quarantine for 5 days from onset of symptoms.  After 5 days you can break quarantine if your symptoms have improved and you have not had a fever for 24 hours without taking Tylenol or ibuprofen.  You will need to wear a mask around other people for additional 5 days.  Take Paxlovid twice daily for 5 days for treatment of COVID-19.  Use the Atrovent nasal spray, 2 squirts up each nostril every 6 hours, as needed for nasal congestion, runny nose, and postnasal drip.  Use the Tessalon Perles every 8 hours during the day as needed for cough.  Take them with a small sip of water.  You may get some numbness to the base of your tongue or metallic taste in mouth, this is normal.  Use the Cheratussin at bedtime as needed for cough and congestion.  Be mindful of this medication will make you drowsy so if you have to get up to sleep at the middle night please make sure that you have your bearings about you before you try and walk to the bathroom to prevent falls.  If you develop any shortness of breath, especially at rest, feel like you cannot catch your breath, you are unable to speak in full sentences, or is a late sign your lips are turning blue you need to call 911 and go to the emergency department.

## 2022-05-28 NOTE — ED Triage Notes (Signed)
Patient c/o cough and chest congestion since Friday.  Patient reports history of COPD.  Patient denies fever. Patient does not want covid test.

## 2022-06-04 ENCOUNTER — Inpatient Hospital Stay
Admission: EM | Admit: 2022-06-04 | Discharge: 2022-06-06 | DRG: 640 | Disposition: A | Discharge status: Home / Self Care | Payer: Medicare Other | Attending: Internal Medicine | Admitting: Internal Medicine

## 2022-06-04 ENCOUNTER — Emergency Department: Payer: Medicare Other

## 2022-06-04 ENCOUNTER — Ambulatory Visit
Admission: EM | Admit: 2022-06-04 | Discharge: 2022-06-04 | Disposition: A | Discharge status: Home / Self Care | Payer: Medicare Other | Source: Home / Self Care

## 2022-06-04 ENCOUNTER — Encounter: Payer: Self-pay | Admitting: Intensive Care

## 2022-06-04 ENCOUNTER — Other Ambulatory Visit: Payer: Self-pay

## 2022-06-04 ENCOUNTER — Ambulatory Visit (INDEPENDENT_AMBULATORY_CARE_PROVIDER_SITE_OTHER): Payer: Medicare Other

## 2022-06-04 DIAGNOSIS — J432 Centrilobular emphysema: Secondary | ICD-10-CM | POA: Insufficient documentation

## 2022-06-04 DIAGNOSIS — K219 Gastro-esophageal reflux disease without esophagitis: Secondary | ICD-10-CM | POA: Diagnosis present

## 2022-06-04 DIAGNOSIS — Z7985 Long-term (current) use of injectable non-insulin antidiabetic drugs: Secondary | ICD-10-CM

## 2022-06-04 DIAGNOSIS — A0839 Other viral enteritis: Secondary | ICD-10-CM | POA: Diagnosis present

## 2022-06-04 DIAGNOSIS — Z79899 Other long term (current) drug therapy: Secondary | ICD-10-CM

## 2022-06-04 DIAGNOSIS — R197 Diarrhea, unspecified: Secondary | ICD-10-CM | POA: Insufficient documentation

## 2022-06-04 DIAGNOSIS — E860 Dehydration: Secondary | ICD-10-CM | POA: Diagnosis present

## 2022-06-04 DIAGNOSIS — Z90710 Acquired absence of both cervix and uterus: Secondary | ICD-10-CM

## 2022-06-04 DIAGNOSIS — E875 Hyperkalemia: Principal | ICD-10-CM | POA: Diagnosis present

## 2022-06-04 DIAGNOSIS — Z7951 Long term (current) use of inhaled steroids: Secondary | ICD-10-CM

## 2022-06-04 DIAGNOSIS — U071 COVID-19: Secondary | ICD-10-CM | POA: Insufficient documentation

## 2022-06-04 DIAGNOSIS — N179 Acute kidney failure, unspecified: Secondary | ICD-10-CM

## 2022-06-04 DIAGNOSIS — E119 Type 2 diabetes mellitus without complications: Secondary | ICD-10-CM

## 2022-06-04 DIAGNOSIS — R059 Cough, unspecified: Secondary | ICD-10-CM

## 2022-06-04 DIAGNOSIS — E871 Hypo-osmolality and hyponatremia: Secondary | ICD-10-CM | POA: Diagnosis present

## 2022-06-04 DIAGNOSIS — R739 Hyperglycemia, unspecified: Secondary | ICD-10-CM

## 2022-06-04 DIAGNOSIS — I959 Hypotension, unspecified: Secondary | ICD-10-CM

## 2022-06-04 DIAGNOSIS — Z87891 Personal history of nicotine dependence: Secondary | ICD-10-CM

## 2022-06-04 DIAGNOSIS — R531 Weakness: Secondary | ICD-10-CM | POA: Insufficient documentation

## 2022-06-04 DIAGNOSIS — Z7952 Long term (current) use of systemic steroids: Secondary | ICD-10-CM

## 2022-06-04 DIAGNOSIS — I129 Hypertensive chronic kidney disease with stage 1 through stage 4 chronic kidney disease, or unspecified chronic kidney disease: Secondary | ICD-10-CM | POA: Diagnosis present

## 2022-06-04 DIAGNOSIS — E1165 Type 2 diabetes mellitus with hyperglycemia: Secondary | ICD-10-CM | POA: Diagnosis not present

## 2022-06-04 DIAGNOSIS — N1831 Chronic kidney disease, stage 3a: Secondary | ICD-10-CM | POA: Diagnosis present

## 2022-06-04 DIAGNOSIS — R1031 Right lower quadrant pain: Secondary | ICD-10-CM

## 2022-06-04 DIAGNOSIS — J449 Chronic obstructive pulmonary disease, unspecified: Secondary | ICD-10-CM

## 2022-06-04 DIAGNOSIS — M5416 Radiculopathy, lumbar region: Secondary | ICD-10-CM | POA: Diagnosis present

## 2022-06-04 DIAGNOSIS — R1032 Left lower quadrant pain: Secondary | ICD-10-CM

## 2022-06-04 DIAGNOSIS — E1122 Type 2 diabetes mellitus with diabetic chronic kidney disease: Secondary | ICD-10-CM | POA: Diagnosis present

## 2022-06-04 DIAGNOSIS — Z791 Long term (current) use of non-steroidal anti-inflammatories (NSAID): Secondary | ICD-10-CM

## 2022-06-04 DIAGNOSIS — Z880 Allergy status to penicillin: Secondary | ICD-10-CM

## 2022-06-04 DIAGNOSIS — R7989 Other specified abnormal findings of blood chemistry: Secondary | ICD-10-CM

## 2022-06-04 DIAGNOSIS — I10 Essential (primary) hypertension: Secondary | ICD-10-CM | POA: Diagnosis present

## 2022-06-04 DIAGNOSIS — Z7984 Long term (current) use of oral hypoglycemic drugs: Secondary | ICD-10-CM

## 2022-06-04 DIAGNOSIS — B9620 Unspecified Escherichia coli [E. coli] as the cause of diseases classified elsewhere: Secondary | ICD-10-CM | POA: Diagnosis present

## 2022-06-04 DIAGNOSIS — J441 Chronic obstructive pulmonary disease with (acute) exacerbation: Secondary | ICD-10-CM

## 2022-06-04 DIAGNOSIS — Z8249 Family history of ischemic heart disease and other diseases of the circulatory system: Secondary | ICD-10-CM

## 2022-06-04 DIAGNOSIS — N390 Urinary tract infection, site not specified: Secondary | ICD-10-CM

## 2022-06-04 LAB — COMPREHENSIVE METABOLIC PANEL
ALT: 26 U/L (ref 0–44)
AST: 21 U/L (ref 15–41)
Albumin: 3.8 g/dL (ref 3.5–5.0)
Alkaline Phosphatase: 73 U/L (ref 38–126)
Anion gap: 13 (ref 5–15)
BUN: 49 mg/dL — ABNORMAL HIGH (ref 8–23)
CO2: 19 mmol/L — ABNORMAL LOW (ref 22–32)
Calcium: 9.7 mg/dL (ref 8.9–10.3)
Chloride: 95 mmol/L — ABNORMAL LOW (ref 98–111)
Creatinine, Ser: 1.44 mg/dL — ABNORMAL HIGH (ref 0.44–1.00)
GFR, Estimated: 39 mL/min — ABNORMAL LOW (ref >60)
Glucose, Bld: 448 mg/dL — ABNORMAL HIGH (ref 70–99)
Potassium: 6.4 mmol/L (ref 3.5–5.1)
Sodium: 127 mmol/L — ABNORMAL LOW (ref 135–145)
Total Bilirubin: 0.9 mg/dL (ref 0.3–1.2)
Total Protein: 7.6 g/dL (ref 6.5–8.1)

## 2022-06-04 LAB — URINALYSIS, ROUTINE W REFLEX MICROSCOPIC
Bilirubin Urine: NEGATIVE
Bilirubin Urine: NEGATIVE
Glucose, UA: >1000 mg/dL — AB
Glucose, UA: >=500 mg/dL — AB
Ketones, ur: NEGATIVE mg/dL
Ketones, ur: NEGATIVE mg/dL
Leukocytes,Ua: NEGATIVE
Leukocytes,Ua: NEGATIVE
Nitrite: NEGATIVE
Nitrite: NEGATIVE
Protein, ur: NEGATIVE mg/dL
Protein, ur: NEGATIVE mg/dL
Specific Gravity, Urine: <1.005 — ABNORMAL LOW (ref 1.005–1.030)
Specific Gravity, Urine: 1.024 (ref 1.005–1.030)
Squamous Epithelial / HPF: NONE SEEN (ref 0–5)
pH: 5.5 (ref 5.0–8.0)
pH: 5 (ref 5.0–8.0)

## 2022-06-04 LAB — BASIC METABOLIC PANEL
Anion gap: 9 (ref 5–15)
BUN: 44 mg/dL — ABNORMAL HIGH (ref 8–23)
CO2: 25 mmol/L (ref 22–32)
Calcium: 9.7 mg/dL (ref 8.9–10.3)
Chloride: 98 mmol/L (ref 98–111)
Creatinine, Ser: 1.24 mg/dL — ABNORMAL HIGH (ref 0.44–1.00)
GFR, Estimated: 47 mL/min — ABNORMAL LOW (ref >60)
Glucose, Bld: 221 mg/dL — ABNORMAL HIGH (ref 70–99)
Potassium: 5.6 mmol/L — ABNORMAL HIGH (ref 3.5–5.1)
Sodium: 132 mmol/L — ABNORMAL LOW (ref 135–145)

## 2022-06-04 LAB — URINALYSIS, MICROSCOPIC (REFLEX): RBC / HPF: >50 RBC/hpf (ref 0–5)

## 2022-06-04 LAB — CBC WITH DIFFERENTIAL/PLATELET
Abs Immature Granulocytes: 0.2 10*3/uL — ABNORMAL HIGH (ref 0.00–0.07)
Basophils Absolute: 0 10*3/uL (ref 0.0–0.1)
Basophils Relative: 0 %
Eosinophils Absolute: 0 10*3/uL (ref 0.0–0.5)
Eosinophils Relative: 0 %
HCT: 48.3 % — ABNORMAL HIGH (ref 36.0–46.0)
Hemoglobin: 15.5 g/dL — ABNORMAL HIGH (ref 12.0–15.0)
Immature Granulocytes: 2 %
Lymphocytes Relative: 9 %
Lymphs Abs: 0.9 10*3/uL (ref 0.7–4.0)
MCH: 28.4 pg (ref 26.0–34.0)
MCHC: 32.1 g/dL (ref 30.0–36.0)
MCV: 88.6 fL (ref 80.0–100.0)
Monocytes Absolute: 0.4 10*3/uL (ref 0.1–1.0)
Monocytes Relative: 4 %
Neutro Abs: 8.4 10*3/uL — ABNORMAL HIGH (ref 1.7–7.7)
Neutrophils Relative %: 85 %
Platelets: 271 10*3/uL (ref 150–400)
RBC: 5.45 MIL/uL — ABNORMAL HIGH (ref 3.87–5.11)
RDW: 13.9 % (ref 11.5–15.5)
WBC: 10 10*3/uL (ref 4.0–10.5)
nRBC: 0 % (ref 0.0–0.2)

## 2022-06-04 LAB — HEMOGLOBIN A1C
Hgb A1c MFr Bld: 8.5 % — ABNORMAL HIGH (ref 4.8–5.6)
Mean Plasma Glucose: 197.25 mg/dL

## 2022-06-04 LAB — CBG MONITORING, ED: Glucose-Capillary: 291 mg/dL — ABNORMAL HIGH (ref 70–99)

## 2022-06-04 LAB — GLUCOSE, CAPILLARY: Glucose-Capillary: 407 mg/dL — ABNORMAL HIGH (ref 70–99)

## 2022-06-04 MED ORDER — IPRATROPIUM-ALBUTEROL 0.5-2.5 (3) MG/3ML IN SOLN: 3.0000 mL | Freq: Four times a day (QID) | RESPIRATORY_TRACT | Status: DC | PRN

## 2022-06-04 MED ORDER — INSULIN ASPART 100 UNIT/ML IJ SOLN
10.0000 [IU] | Freq: Once | INTRAMUSCULAR | Status: AC
  Administered 2022-06-04: 10 [IU] via INTRAVENOUS
  Filled 2022-06-04: qty 1

## 2022-06-04 MED ORDER — ACETAMINOPHEN 650 MG RE SUPP: 650.0000 mg | Freq: Four times a day (QID) | RECTAL | Status: DC | PRN

## 2022-06-04 MED ORDER — BENZONATATE 100 MG PO CAPS
200.0000 mg | ORAL_CAPSULE | Freq: Three times a day (TID) | ORAL | Status: DC
  Administered 2022-06-04 – 2022-06-06 (×5): 200 mg via ORAL
  Filled 2022-06-04 (×6): qty 2

## 2022-06-04 MED ORDER — ACETAMINOPHEN 325 MG PO TABS
650.0000 mg | ORAL_TABLET | Freq: Four times a day (QID) | ORAL | Status: DC | PRN
  Administered 2022-06-05: 650 mg via ORAL
  Filled 2022-06-04: qty 2

## 2022-06-04 MED ORDER — AMLODIPINE BESYLATE 5 MG PO TABS
5.0000 mg | ORAL_TABLET | Freq: Every day | ORAL | Status: DC
  Administered 2022-06-05: 5 mg via ORAL
  Filled 2022-06-04: qty 1

## 2022-06-04 MED ORDER — FLUTICASONE FUROATE-VILANTEROL 200-25 MCG/ACT IN AEPB
1.0000 | INHALATION_SPRAY | Freq: Every day | RESPIRATORY_TRACT | Status: DC
  Administered 2022-06-05 – 2022-06-06 (×2): 1 via RESPIRATORY_TRACT
  Filled 2022-06-04: qty 28

## 2022-06-04 MED ORDER — DEXTROSE 10 % IV SOLN
500.0000 mL | Freq: Once | INTRAVENOUS | Status: AC
  Administered 2022-06-04: 500 mL via INTRAVENOUS

## 2022-06-04 MED ORDER — IPRATROPIUM-ALBUTEROL 0.5-2.5 (3) MG/3ML IN SOLN
3.0000 mL | Freq: Once | RESPIRATORY_TRACT | Status: AC
  Administered 2022-06-04: 3 mL via RESPIRATORY_TRACT
  Filled 2022-06-04: qty 3

## 2022-06-04 MED ORDER — LACTATED RINGERS IV BOLUS
1000.0000 mL | Freq: Once | INTRAVENOUS | Status: AC
  Administered 2022-06-04: 1000 mL via INTRAVENOUS

## 2022-06-04 MED ORDER — ENOXAPARIN SODIUM 40 MG/0.4ML IJ SOSY
40.0000 mg | PREFILLED_SYRINGE | INTRAMUSCULAR | Status: DC
  Administered 2022-06-04 – 2022-06-05 (×2): 40 mg via SUBCUTANEOUS
  Filled 2022-06-04 (×2): qty 0.4

## 2022-06-04 MED ORDER — ATORVASTATIN CALCIUM 20 MG PO TABS
40.0000 mg | ORAL_TABLET | Freq: Every evening | ORAL | Status: DC
  Administered 2022-06-05: 40 mg via ORAL
  Filled 2022-06-04: qty 2

## 2022-06-04 MED ORDER — CALCIUM GLUCONATE 10 % IV SOLN
1.0000 g | Freq: Once | INTRAVENOUS | Status: AC
  Administered 2022-06-04: 1 g via INTRAVENOUS
  Filled 2022-06-04: qty 10

## 2022-06-04 MED ORDER — SODIUM ZIRCONIUM CYCLOSILICATE 10 G PO PACK
10.0000 g | PACK | ORAL | Status: AC
  Administered 2022-06-04: 10 g via ORAL
  Filled 2022-06-04: qty 1

## 2022-06-04 MED ORDER — SODIUM BICARBONATE 8.4 % IV SOLN
50.0000 meq | Freq: Once | INTRAVENOUS | Status: AC
  Administered 2022-06-04: 50 meq via INTRAVENOUS
  Filled 2022-06-04: qty 50

## 2022-06-04 MED ORDER — INSULIN ASPART 100 UNIT/ML IJ SOLN: 0.0000 [IU] | Freq: Three times a day (TID) | INTRAMUSCULAR | Status: DC

## 2022-06-04 MED ORDER — SODIUM CHLORIDE 0.9% FLUSH
3.0000 mL | Freq: Two times a day (BID) | INTRAVENOUS | Status: DC
  Administered 2022-06-04 – 2022-06-06 (×4): 3 mL via INTRAVENOUS

## 2022-06-04 MED ORDER — LACTATED RINGERS IV SOLN: INTRAVENOUS | Status: DC

## 2022-06-04 MED ORDER — INSULIN ASPART 100 UNIT/ML IJ SOLN
0.0000 [IU] | Freq: Three times a day (TID) | INTRAMUSCULAR | Status: DC
  Administered 2022-06-04: 8 [IU] via SUBCUTANEOUS
  Administered 2022-06-05: 15 [IU] via SUBCUTANEOUS
  Administered 2022-06-05: 5 [IU] via SUBCUTANEOUS
  Administered 2022-06-05: 3 [IU] via SUBCUTANEOUS
  Administered 2022-06-06: 5 [IU] via SUBCUTANEOUS
  Filled 2022-06-04 (×5): qty 1

## 2022-06-04 MED ORDER — TIOTROPIUM BROMIDE MONOHYDRATE 18 MCG IN CAPS
18.0000 ug | ORAL_CAPSULE | Freq: Every day | RESPIRATORY_TRACT | Status: DC
  Administered 2022-06-05 – 2022-06-06 (×2): 18 ug via RESPIRATORY_TRACT
  Filled 2022-06-04: qty 5

## 2022-06-04 MED ORDER — POLYETHYLENE GLYCOL 3350 17 G PO PACK: 17.0000 g | PACK | Freq: Every day | ORAL | Status: DC | PRN

## 2022-06-04 NOTE — Assessment & Plan Note (Signed)
Glucose level markedly elevated on presentation at 448.  While bicarb is mildly reduced at 19 with anion gap of 13, no ketonuria noted on urinalysis.  I suspect that marked hyperglycemia is secondary to acute viral illness with recently missed dose of Ozempic in the setting of uncontrolled diabetes.  Given that patient is able to tolerate p.o. intake at this time, will hold off on insulin infusion.  - SSI with meals with moderate correction coverage - Repeat A1c pending

## 2022-06-04 NOTE — Assessment & Plan Note (Signed)
Home medications include amlodipine, losartan and HCTZ.  Given AKI, holding losartan and HCTZ at this time.  -Restart home amlodipine

## 2022-06-04 NOTE — Discharge Instructions (Signed)
-  You are very dehydrated and needs fluids.  I am also concerned about your leg weakness and overall weakness. - Your blood pressure has been low in the clinic today.  Given that you do have COVID-19, COPD and are becoming more weak and feeling worse, you should follow-up in the emergency department right away.  They will likely give you fluids and check your electrolytes.  Since you have had diarrhea and fluid loss, you likely have low potassium.  This may need to be replaced. - You also could have a UTI.  Make sure you mention this when you go to the ER. - Your chest x-ray did not show pneumonia.  You have been advised to follow up immediately in the emergency department for concerning signs.symptoms. If you declined EMS transport, please have a family member take you directly to the ED at this time. Do not delay. Based on concerns about condition, if you do not follow up in th e ED, you may risk poor outcomes including worsening of condition, delayed treatment and potentially life threatening issues. If you have declined to go to the ED at this time, you should call your PCP immediately to set up a follow up appointment.  Go to ED for red flag symptoms, including; fevers you cannot reduce with Tylenol/Motrin, severe headaches, vision changes, numbness/weakness in part of the body, lethargy, confusion, intractable vomiting, severe dehydration, chest pain, breathing difficulty, severe persistent abdominal or pelvic pain, signs of severe infection (increased redness, swelling of an area), feeling faint or passing out, dizziness, etc. You should especially go to the ED for sudden acute worsening of condition if you do not elect to go at this time.

## 2022-06-04 NOTE — ED Notes (Signed)
Advised nurse that patient has ready bed 

## 2022-06-04 NOTE — Assessment & Plan Note (Signed)
Sodium low with elevated sugars.

## 2022-06-04 NOTE — Assessment & Plan Note (Signed)
Patient has a history of severe COPD on triple therapy bronchodilators and supplemental oxygen, 2 L nasal cannula. On examination, patient has diffuse wheezing throughout.  She notes she has not used her inhaler in over 24 hours and has had COVID this week. She is saturating well on room air at this time.   - Restart home bronchodilators - DuoNebs every 6 hours as needed - Supplemental oxygen as needed to maintain O2 saturation above 88%

## 2022-06-04 NOTE — ED Triage Notes (Signed)
Patient c/o bilateral hip and leg pain. Reports she was told her electrolytes are off and sent to ER. Tested positive for covid 05/28/22 but denies symptoms today.   HX COPD

## 2022-06-04 NOTE — ED Notes (Signed)
Patient is being discharged from the Urgent Care and sent to the [facility] Emergency Department via private vehicle . Per Laurene Footman, PA, patient is in need of higher level of care due to hyperglycemia and dehydration. Patient is aware and verbalizes understanding of plan of care.  Vitals:   06/04/22 1309 06/04/22 1340  BP: (!) 85/59 101/65  Pulse: 69   Resp: 18   Temp: 98 F (36.7 C)   SpO2: 97%

## 2022-06-04 NOTE — Assessment & Plan Note (Signed)
Per chart review, patient has a history of uncontrolled diabetes currently being managed with Ozempic.  Her last A1c was 9 months ago and elevated at 9.3%.   - Repeat A1c pending - SSI

## 2022-06-04 NOTE — ED Provider Notes (Signed)
[facility] Provider Note    Event Date/Time   First MD Initiated Contact with Patient 06/04/22 1622     (approximate)   History   Chief Complaint: Abnormal Lab   HPI  Katherine Carter is a 71 y.o. female with a history of hypertension diabetes COPD who is sent to the ED today due to abnormal electrolytes.  She complains of lightheadedness and generalized weakness along with nausea.  She tested positive for COVID 1 week ago and does report decreased oral intake during this time due to malaise.  Denies palpitations or shortness of breath currently.  Denies syncope.     Physical Exam   Triage Vital Signs: ED Triage Vitals [06/04/22 1535]  Enc Vitals Group     BP 113/88     Pulse Rate 71     Resp 18     Temp 98 F (36.7 C)     Temp Source Oral     SpO2 96 %     Weight 156 lb (70.8 kg)     Height 5\' 3"  (1.6 m)     Head Circumference      Peak Flow      Pain Score 4     Pain Loc      Pain Edu?      Excl. in GC?     Most recent vital signs: Vitals:   06/04/22 1630 06/04/22 1715  BP: 113/73 134/74  Pulse: 77 67  Resp:  18  Temp:    SpO2: 96% 96%    General: Awake, no distress.  CV:  Good peripheral perfusion.  Regular rate and rhythm.  Symmetric distal pulses Resp:  Normal effort.  Left basilar crackles.  No wheezing. Abd:  No distention.  Soft nontender Other:  Dry mucous membranes.  Normal mental status.   ED Results / Procedures / Treatments   Labs (all labs ordered are listed, but only abnormal results are displayed) Labs Reviewed  CBC WITH DIFFERENTIAL/PLATELET - Abnormal; Notable for the following components:      Result Value   RBC 5.45 (*)    Hemoglobin 15.5 (*)    HCT 48.3 (*)    Neutro Abs 8.4 (*)    Abs Immature Granulocytes 0.20 (*)    All other components within normal limits  COMPREHENSIVE METABOLIC PANEL - Abnormal; Notable for the following components:   Sodium 127 (*)    Potassium 6.4 (*)    Chloride  95 (*)    CO2 19 (*)    Glucose, Bld 448 (*)    BUN 49 (*)    Creatinine, Ser 1.44 (*)    GFR, Estimated 39 (*)    All other components within normal limits  URINALYSIS, ROUTINE W REFLEX MICROSCOPIC - Abnormal; Notable for the following components:   Color, Urine YELLOW (*)    APPearance CLEAR (*)    Glucose, UA >=500 (*)    Hgb urine dipstick MODERATE (*)    Bacteria, UA RARE (*)    All other components within normal limits  CBG MONITORING, ED - Abnormal; Notable for the following components:   Glucose-Capillary 291 (*)    All other components within normal limits  COMPREHENSIVE METABOLIC PANEL  CBC  HEMOGLOBIN A1C  OSMOLALITY  BASIC METABOLIC PANEL     EKG Interpreted by me Sinus rhythm rate of 68, left axis, normal intervals.  Poor R wave progression.  Normal ST segments.  Peaked T waves in the inferolateral leads  RADIOLOGY Chest x-ray from earlier today at [facility] urgent care independently interpreted by me, appears normal.  No evidence of pneumonia pleural effusion or pulmonary edema.  Radiology report reviewed.   PROCEDURES:  .1-3 Lead EKG Interpretation  Performed by: Carrie Mew, MD Authorized by: Carrie Mew, MD     Interpretation: normal     ECG rate:  70   ECG rate assessment: normal     Rhythm: sinus rhythm     Ectopy: none     Conduction: normal   Comments:        .Critical Care  Performed by: Carrie Mew, MD Authorized by: Carrie Mew, MD   Critical care provider statement:    Critical care time (minutes):  35   Critical care time was exclusive of:  Separately billable procedures and treating other patients   Critical care was necessary to treat or prevent imminent or life-threatening deterioration of the following conditions:  Renal failure, dehydration, metabolic crisis and cardiac failure   Critical care was time spent personally by me on the following activities:  Development of treatment plan with patient or  surrogate, discussions with consultants, evaluation of patient's response to treatment, examination of patient, obtaining history from patient or surrogate, ordering and performing treatments and interventions, ordering and review of laboratory studies, ordering and review of radiographic studies, pulse oximetry, re-evaluation of patient's condition and review of old charts   Care discussed with: admitting provider      [place] ED: Medications  enoxaparin (LOVENOX) injection 40 mg (40 mg Subcutaneous Given 06/04/22 1831)  sodium chloride flush (NS) 0.9 % injection 3 mL (has no administration in time range)  acetaminophen (TYLENOL) tablet 650 mg (has no administration in time range)    Or  acetaminophen (TYLENOL) suppository 650 mg (has no administration in time range)  polyethylene glycol (MIRALAX / GLYCOLAX) packet 17 g (has no administration in time range)  lactated ringers bolus 1,000 mL (1,000 mLs Intravenous New Bag/Given 06/04/22 1832)    Followed by  lactated ringers infusion (has no administration in time range)  ipratropium-albuterol (DUONEB) 0.5-2.5 (3) MG/3ML nebulizer solution 3 mL (has no administration in time range)  fluticasone furoate-vilanterol (BREO ELLIPTA) 200-25 MCG/ACT 1 puff (has no administration in time range)  tiotropium (SPIRIVA) inhalation capsule ([facility] use ONLY) 18 mcg (has no administration in time range)  insulin aspart (novoLOG) injection 0-15 Units (8 Units Subcutaneous Given 06/04/22 1830)  calcium gluconate inj 10% (1 g) URGENT USE ONLY! (1 g Intravenous Given 06/04/22 1648)  sodium bicarbonate injection 50 mEq (50 mEq Intravenous Given 06/04/22 1650)  insulin aspart (novoLOG) injection 10 Units (10 Units Intravenous Given 06/04/22 1649)  dextrose 10 % infusion (0 mLs Intravenous Stopped 06/04/22 1822)  sodium zirconium cyclosilicate (LOKELMA) packet 10 g (10 g Oral Given 06/04/22 1846)  lactated ringers bolus 1,000 mL (1,000 mLs Intravenous New  Bag/Given 06/04/22 1719)  ipratropium-albuterol (DUONEB) 0.5-2.5 (3) MG/3ML nebulizer solution 3 mL (3 mLs Nebulization Given 06/04/22 1831)     IMPRESSION / MDM / [place] / ED COURSE  I reviewed the triage vital signs and the nursing notes.                              Differential diagnosis includes, but is not limited to, dehydration, AKI, anemia, viral syndrome, hyperkalemia, hyponatremia, UTI  Patient's presentation is most consistent with acute presentation with potential threat to life or bodily function.  Patient presents with lightheadedness and generalized weakness after oral intake from recent COVID.  Not hypoxic, but does have AKI with a creatinine of 1.4, hyperglycemia, potassium of 6.4 and sodium of 127.  This is all acute.  Patient given fluid bolus as well as D10 bolus, NovoLog bolus, bicarb and calcium gluconate for cardiac stabilization.  Lokelma given as well for potassium elimination.  Case discussed with hospitalist for further management.       FINAL CLINICAL IMPRESSION(S) / ED DIAGNOSES   Final diagnoses:  AKI (acute kidney injury) ([place])  Hyperkalemia  Type 2 diabetes mellitus with hyperglycemia, with long-term current use of insulin ([place])     Rx / DC Orders   ED Discharge Orders     None        Note:  This document was prepared using Dragon voice recognition software and may include unintentional dictation errors.   Carrie Mew, MD 06/04/22 Pauline [place]

## 2022-06-04 NOTE — Assessment & Plan Note (Addendum)
Acute kidney injury on chronic kidney disease stage IIIa .  Creatinine 1.44 on presentation down to 1.05.  Hold nephrotoxic agents including home losartan and HCTZ.

## 2022-06-04 NOTE — ED Triage Notes (Signed)
Pt c/o bilateral  hip and leg soreness, x2days, cough and diarrhea, x4days.  Pt was seen on 05/28/22 and tested positive for covid.  Pt has been taking 550mg  tylenol. Pt last dose was this morning.   Pt has COPD  Pt BP was 85/59 and pt states that she feels lightheaded starting today.

## 2022-06-04 NOTE — Assessment & Plan Note (Addendum)
Patient presenting with potassium of 6.4 in the setting of AKI and marked hyperglycemia.  Evidence of EKG changes include peaked T waves.  Initial management in the ED includes calcium gluconate, bicarb, IV fluids, NovoLog and Lokelma.   - Continue IV fluid resuscitation with LR - Repeat potassium this evening - If potassium remains elevated, consider repeat dosing of Lokelma and NovoLog

## 2022-06-04 NOTE — H&P (Signed)
History and Physical    Patient: Katherine Carter DOB: Feb 14, 1951  DOA: 06/04/2022 DOS: the patient was seen and examined on 06/04/2022  PCP: Donnie Coffin, MD   Patient coming from: Home  Chief Complaint: Bilateral hip and abdominal pain; generalized weakness   HPI: Katherine Carter is a 71 y.o. female with medical history significant of severe COPD stage III, type 2 diabetes, hypertension, who presents to the ED with complaints of bilateral hip and abdominal pain in addition to generalized weakness.  Katherine Carter states she began to experience a productive cough approximately 3 days before visiting the urgent care on 05/28/2022.  She was also experiencing fever, congestion, and nonbloody, nonmelanotic diarrhea.  At that time, she was tested for COVID-19 and was positive.  She was discharged home on Paxlovid and supportive management with Tessalon Perles and Cheratussin.    Since that visit, she states symptoms of generalized weakness and daily diarrhea have persisted.  She has at least 2 episodes of diarrhea daily with more frequent episodes over the last couple days.  Katherine Carter has been trying to keep up with oral intake, however has been been having difficulty due to generalized weakness.  Over the last 24 hours, she began experiencing worsening generalized weakness in addition to bilateral lower abdominal pain that seem to radiate around into her hips.  She denies any shooting pain down her legs, focal weakness.  She denies any CVA tenderness but endorses urinary frequency.  She initially went to the [facility], however due to significance of dehydration and weakness, she was recommended to come to the ED  Of note, Katherine Carter states she has missed her most recent dose of Ozempic.  In addition she has not had her inhalers for over 24 hours.  ED course: On arrival to the ED, patient had blood pressure on the lower end of normal at 101/65 with heart  rate of 69.  Initial CBG was elevated at 407.  Initial CMP remarkable for potassium of 6.4 with creatinine of 1.44 in addition to glucose of 448.  EKG obtained that shows peaked T waves.  Due to symptomatic hyperkalemia with AKI and severe hyperglycemia, [facility] contacted for admission.  Review of Systems: As mentioned in the history of present illness. All other systems reviewed and are negative.  Past Medical History:  Diagnosis Date   COPD (chronic obstructive pulmonary disease) ([place])    Diabetes mellitus without complication (HCC)    GERD (gastroesophageal reflux disease)    Hypertension    Past Surgical History:  Procedure Laterality Date   ABDOMINAL HYSTERECTOMY     COLONOSCOPY WITH PROPOFOL N/A 01/23/2017   Procedure: COLONOSCOPY WITH PROPOFOL;  Surgeon: Lollie Sails, MD;  Location: [facility] ENDOSCOPY;  Service: Endoscopy;  Laterality: N/A;   FOOT SURGERY     Social History:  reports that she quit smoking about 7 years ago. Her smoking use included cigarettes. She has a 12.50 pack-year smoking history. She has never used smokeless tobacco. She reports that she does not drink alcohol and does not use drugs.  Allergies  Allergen Reactions   Penicillins Hives and Other (See Comments)    Has patient had a PCN reaction causing immediate rash, facial/tongue/throat swelling, SOB or lightheadedness with hypotension: No Has patient had a PCN reaction causing severe rash involving mucus membranes or skin necrosis: No Has patient had a PCN reaction that required hospitalization: No Has patient had a PCN reaction occurring within the last 10 years:  No If all of the above answers are "NO", then may proceed with Cephalosporin use.    Family History  Problem Relation Age of Onset   CAD Mother    CAD Father    Breast cancer Neg Hx     Prior to Admission medications   Medication Sig Start Date End Date Taking? Authorizing Provider  ACCU-CHEK GUIDE test strip 1 each daily. 09/07/21   [provider]  Accu-Chek Softclix Lancets lancets SMARTSIG:Topical 09/07/21   [provider]  amLODipine (NORVASC) 5 MG tablet Take 1 tablet (5 mg total) by mouth daily. 09/05/21   Hosie Poisson, MD  aspirin EC 81 MG tablet Take 81 mg by mouth daily. 03/15/22   [provider]  atorvastatin (LIPITOR) 40 MG tablet Take 1 tablet (40 mg total) by mouth every evening. 09/05/21   Hosie Poisson, MD  azelastine (ASTELIN) 0.1 % nasal spray SPRAY 1 SPRAY INTO EACH NOSTRIL 2 TIMES DAILY. 07/19/21   [provider]  benzonatate (TESSALON) 100 MG capsule Take 2 capsules (200 mg total) by mouth every 8 (eight) hours. 05/28/22   Margarette Canada, NP  Blood Glucose Monitoring Suppl (ACCU-CHEK GUIDE) w/Device KIT USE 1 DEVICE AS DIRECTED THREE TIMES A DAY FOR GLUCOSE MONITORING 09/07/21   [provider]  budesonide-formoterol (SYMBICORT) 160-4.5 MCG/ACT inhaler Inhale 2 puffs into the lungs 2 (two) times daily.    [provider]  Dextromethorphan-guaiFENesin (MUCINEX DM) 30-600 MG TB12 SMARTSIG:1-2 Tablet(s) By Mouth Every 12 Hours PRN 04/28/22   [provider]  fluticasone (FLONASE) 50 MCG/ACT nasal spray Place into the nose. 07/19/21   [provider]  Fluticasone Furoate-Vilanterol (BREO ELLIPTA IN) Breo Ellipta 100 mcg-25 mcg/dose powder for inhalation    [provider]  guaiFENesin-codeine (ROBITUSSIN AC) 100-10 MG/5ML syrup Take 5 mLs by mouth 3 (three) times daily as needed for cough. 05/28/22   Margarette Canada, NP  hydrochlorothiazide (MICROZIDE) 12.5 MG capsule Take 12.5 mg by mouth daily. 09/22/21   [provider]  ipratropium (ATROVENT) 0.06 % nasal spray Place 2 sprays into both nostrils 4 (four) times daily. 05/28/22   Margarette Canada, NP  ipratropium-albuterol (DUONEB) 0.5-2.5 (3) MG/3ML SOLN Take 3 mLs by nebulization every 4 (four) hours as needed. Patient taking differently: Take 3 mLs by nebulization every 4 (four) hours as needed.  Last dose: 730 am. 09/05/21   Hosie Poisson, MD  JARDIANCE 10 MG TABS tablet Take 10 mg by mouth daily. 09/07/21   [provider]  Lancets MISC SMARTSIG:Topical 09/07/21   [provider]  latanoprost (XALATAN) 0.005 % ophthalmic solution SMARTSIG:In Eye(s) 11/01/21   [provider]  losartan (COZAAR) 100 MG tablet Take 100 mg by mouth daily. 05/31/22   [provider]  losartan (COZAAR) 50 MG tablet Take 100 mg by mouth daily.    [provider]  meloxicam (MOBIC) 7.5 MG tablet Take 7.5 mg by mouth daily. 10/07/21   [provider]  metFORMIN (GLUCOPHAGE-XR) 500 MG 24 hr tablet Take 500 mg by mouth daily with breakfast.    [provider]  methocarbamol (ROBAXIN) 500 MG tablet Take by mouth. 06/21/21   [provider]  MIRALAX 17 g packet  06/23/21   [provider]  nystatin (MYCOSTATIN) 100000 UNIT/ML suspension Take 5 mLs (500,000 Units total) by mouth 4 (four) times daily. 11/03/21   Margarette Canada, NP  ondansetron (ZOFRAN) 4 MG tablet Take 4 mg by mouth every 8 (eight) hours as needed. 05/05/22  [provider]  OZEMPIC, 0.25 OR 0.5 MG/DOSE, 2 MG/3ML SOPN Inject into the skin. 04/26/22   [provider]  pantoprazole (PROTONIX) 40 MG tablet Take 1 tablet (40 mg total) by mouth daily. 06/11/21   Coral Spikes, DO  predniSONE (DELTASONE) 10 MG tablet Take by mouth. 04/28/22   [provider]  Respiratory Therapy Supplies (ADULT MASK LARGE) [place] See admin instructions. use with inhaler 10/17/21   [provider]  Spacer/Aero-Holding Chambers (AEROCHAMBER PLUS) inhaler Use with inhaler 10/15/21   Melynda Ripple, MD  tiotropium (SPIRIVA HANDIHALER) 18 MCG inhalation capsule Place 1 capsule (18 mcg total) into inhaler and inhale daily. 09/05/21   Hosie Poisson, MD  tiZANidine (ZANAFLEX) 4 MG tablet tizanidine 4 mg tablet    [provider]  VOLTAREN ARTHRITIS PAIN 1 % GEL Apply topically.  03/22/22   [provider]  XOPENEX HFA 45 MCG/ACT inhaler Inhale into the lungs. 11/02/21   [provider]    Physical Exam: Vitals:   06/04/22 1535 06/04/22 1630 06/04/22 1715 06/04/22 1939  BP: 113/88 113/73 134/74 (!) 141/68  Pulse: 71 77 67 81  Resp: _0 Temp: 98 F (36.7 C)   98.5 F (36.9 C)  TempSrc: Oral     SpO2: 96% 96% 96% 98%  Weight: 70.8 kg     Height: _1  (1.6 m)      Physical Exam Vitals and nursing note reviewed.  Constitutional:      Appearance: She is overweight.  HENT:     Head: Normocephalic and atraumatic.     Mouth/Throat:     Mouth: Mucous membranes are dry.     Pharynx: Oropharynx is clear.  Eyes:     Extraocular Movements: Extraocular movements intact.     Pupils: Pupils are equal, round, and reactive to light.  Cardiovascular:     Rate and Rhythm: Normal rate and regular rhythm.     Heart sounds: Heart sounds are distant.  Pulmonary:     Effort: Pulmonary effort is normal. No respiratory distress.     Breath sounds: Wheezing (Diffuse expiratory wheezing auscultated) present. No rhonchi or rales.  Abdominal:     General: Bowel sounds are normal.     Palpations: Abdomen is soft.     Tenderness: There is abdominal tenderness in the right lower quadrant, epigastric area, periumbilical area and left lower quadrant. There is no guarding.  Musculoskeletal:     Right hip: No deformity, tenderness, bony tenderness or crepitus. Normal range of motion.     Left hip: No deformity, tenderness, bony tenderness or crepitus. Normal range of motion.     Right upper leg: No tenderness.     Left upper leg: No tenderness.     Right lower leg: No edema.     Left lower leg: No edema.     Comments: Negative FABER test bilaterally.   Skin:    General: Skin is warm and dry.     Findings: No bruising, erythema or rash.  Neurological:     General: No focal deficit present.     Mental Status: She is alert and oriented to person, place, and  time.     Comments: 5 out of 5 strength of bilateral lower extremities  Psychiatric:        Mood and Affect: Mood normal.        Behavior: Behavior normal.        Thought Content: Thought content normal.  Judgment: Judgment normal.    Data Reviewed: CBC remarkable for hemoglobin of 15.5.  CMP remarkable for sodium of 127, potassium of 6.4, bicarb of 19, glucose of 448, BUN of 49, creatinine of 1.44.  Repeat urinalysis remarkable for glucosuria, hematuria with pyuria and rare bacteria.  Chest x-ray independently reviewed: No interstitial opacities noted or pleural effusions.  Diaphragmatic flattening consistent with hyperinflation  EKG independently reviewed: Sinus rhythm with no ST elevation or depression.  Peaked T waves noted.  Results are pending, will review when available.  Assessment and Plan:  * Hyperkalemia Patient presenting with potassium of 6.4 in the setting of AKI and marked hyperglycemia.  Evidence of EKG changes include peaked T waves.  Initial management in the ED includes calcium gluconate, bicarb, IV fluids, NovoLog and Lokelma.   - Continue IV fluid resuscitation with LR - Repeat potassium this evening - If potassium remains elevated, consider repeat dosing of Lokelma and NovoLog  AKI (acute kidney injury) ([place]) AKI prerenal in etiology secondary to recent viral illness with ongoing diarrhea and difficulty maintaining p.o. intake.  Patient has received 2 L of LR in the ED.  - Repeat BMP in the a.m. - Continue IV fluid resuscitation with LR at 100 cc/hr for 12 hours - Hold nephrotoxic agents including home losartan and HCTZ  Hyperglycemia Glucose level markedly elevated on presentation at 448.  While bicarb is mildly reduced at 19 with anion gap of 13, no ketonuria noted on urinalysis.  I suspect that marked hyperglycemia is secondary to acute viral illness with recently missed dose of Ozempic in the setting of uncontrolled diabetes.  Given that patient is  able to tolerate p.o. intake at this time, will hold off on insulin infusion.  - SSI with meals with moderate correction coverage - Repeat A1c pending  Diabetes ([place]) Per chart review, patient has a history of uncontrolled diabetes currently being managed with Ozempic.  Her last A1c was 9 months ago and elevated at 9.3%.   - Repeat A1c pending - SSI  Acute bilateral lower abdominal pain Katherine Carter complains of bilateral lower abdominal pain that radiates around to her hips.  This began rather abruptly over the last 24 hours.  Symptoms are exacerbated by coughing.  Due to this, primary differential is musculoskeletal in the setting of multiple electrolyte abnormalities and cough exacerbation.  Lower on the differential is renal colic given hematuria, however bilateral nature makes this unlikely - will hold off on CT renal study.   - Management of electrolyte abnormalities as noted above - Supportive management otherwise   Stage 3 severe COPD by GOLD classification [facility]) Patient has a history of severe COPD on triple therapy bronchodilators and supplemental oxygen, 2 L nasal cannula. On examination, patient has diffuse wheezing throughout.  She notes she has not used her inhaler in over 24 hours and has had COVID this week. She is saturating well on room air at this time.   - Restart home bronchodilators - DuoNebs every 6 hours as needed - Supplemental oxygen as needed to maintain O2 saturation above 88%  Hyperosmolar hyponatremia Corrected sodium of 135.  -Repeat BMP this evening  HTN (hypertension) Home medications include amlodipine, losartan and HCTZ.  Given AKI, holding losartan and HCTZ at this time.  -Restart home amlodipine   Advance Care Planning:   Code Status: Full Code   Consults: None  Family Communication: Patient's husband updated at bedside.   Severity of Illness: The appropriate patient status for this patient is  OBSERVATION. Observation status is judged to  be reasonable and necessary in order to provide the required intensity of service to ensure the patient's safety. The patient's presenting symptoms, physical exam findings, and initial radiographic and laboratory data in the context of their medical condition is felt to place them at decreased risk for further clinical deterioration. Furthermore, it is anticipated that the patient will be medically stable for discharge from the hospital within 2 midnights of admission.   Author: Jose Persia, MD 06/04/2022 8:00 PM  For on call review [email or web address].

## 2022-06-04 NOTE — ED Provider Notes (Signed)
[facility]    CSN: [id number] Arrival date & time: 06/04/22  1246      History   Chief Complaint Chief Complaint  Patient presents with   Cough   Diarrhea   Hip Pain    HPI Katherine Carter is a 71 y.o. female presenting for lightheadedness, bilateral hip and leg pain/weakness, cough and diarrhea.   Patient was seen in this clinic 1 week ago and had a positive COVID test.  She had been been ill for 3 days at that time.  She was prescribed Paxlovid and Cheratussin.  Patient reports the diarrhea has slowed down and she had a couple episodes of diarrhea yesterday but not any diarrhea today.  Her appetite has been decreased.  She says her mouth is very dry.  She has been trying to drink fluids.  She also reports headaches, lightheadedness/dizziness, fatigue, generalized weakness.  She reports that her cough is productive of clear sputum.  She denies being any more short of breath than normal.  Patient says her blood pressure is normally high and not low.  In the clinic today it is 85/59.  Her husband has given her a cane to walk around with because her legs are so weak she feels like she cannot walk.  She is denying any associated numbness of her legs.  She says her back hurts when she leans forward.  Denies chest pain or abdominal pain.  Thinks she could have a UTI.  Reports that she started having a little bit of burning with urination today.  Patient's medical history significant for COPD, diabetes, GERD, hypertension.  Patient also has history of spinal stenosis and lumbar radiculopathy.  She follows up with the [facility] and was seen about 3 weeks ago for lumbar radiculopathy.  HPI  Past Medical History:  Diagnosis Date   COPD (chronic obstructive pulmonary disease) ([place])    Diabetes mellitus without complication (HCC)    GERD (gastroesophageal reflux disease)    Hypertension     Patient Active Problem List   Diagnosis Date Noted   Pure  hypercholesterolemia    Elevated troponin    Acute respiratory failure with hypoxemia ([place]) 09/02/2021   Sepsis ([place]) 07/08/2018   CAP (community acquired pneumonia) 07/08/2018   Diabetes ([place]) 07/08/2018   HTN (hypertension) 07/08/2018   COPD exacerbation ([place]) 07/08/2018   Acute respiratory failure with hypoxia and hypercapnia ([place]) 12/10/2015    Past Surgical History:  Procedure Laterality Date   ABDOMINAL HYSTERECTOMY     COLONOSCOPY WITH PROPOFOL N/A 01/23/2017   Procedure: COLONOSCOPY WITH PROPOFOL;  Surgeon: Lollie Sails, MD;  Location: [facility];  Service: Endoscopy;  Laterality: N/A;   FOOT SURGERY      OB History   No obstetric history on file.      Home Medications    Prior to Admission medications   Medication Sig Start Date End Date Taking? Authorizing Provider  ACCU-CHEK GUIDE test strip 1 each daily. 09/07/21  Yes [provider]  Accu-Chek Softclix Lancets lancets SMARTSIG:Topical 09/07/21  Yes [provider]  amLODipine (NORVASC) 5 MG tablet Take 1 tablet (5 mg total) by mouth daily. 09/05/21  Yes Hosie Poisson, MD  aspirin EC 81 MG tablet Take 81 mg by mouth daily. 03/15/22  Yes [provider]  atorvastatin (LIPITOR) 40 MG tablet Take 1 tablet (40 mg total) by mouth every evening. 09/05/21  Yes Hosie Poisson, MD  azelastine (ASTELIN) 0.1 % nasal spray SPRAY 1 SPRAY INTO [facility]  NOSTRIL 2 TIMES DAILY. 07/19/21  Yes [provider]  benzonatate (TESSALON) 100 MG capsule Take 2 capsules (200 mg total) by mouth every 8 (eight) hours. 05/28/22  Yes Margarette Canada, NP  Blood Glucose Monitoring Suppl (ACCU-CHEK GUIDE) w/Device KIT USE 1 DEVICE AS DIRECTED THREE TIMES A DAY FOR GLUCOSE MONITORING 09/07/21  Yes [provider]  budesonide-formoterol (SYMBICORT) 160-4.5 MCG/ACT inhaler Inhale 2 puffs into the lungs 2 (two) times daily.   Yes [provider]  fluticasone (FLONASE) 50 MCG/ACT nasal spray Place into the nose.  07/19/21  Yes [provider]  Fluticasone Furoate-Vilanterol (BREO ELLIPTA IN) Breo Ellipta 100 mcg-25 mcg/dose powder for inhalation   Yes [provider]  guaiFENesin-codeine (ROBITUSSIN AC) 100-10 MG/5ML syrup Take 5 mLs by mouth 3 (three) times daily as needed for cough. 05/28/22  Yes Margarette Canada, NP  hydrochlorothiazide (MICROZIDE) 12.5 MG capsule Take 12.5 mg by mouth daily. 09/22/21  Yes [provider]  ipratropium (ATROVENT) 0.06 % nasal spray Place 2 sprays into both nostrils 4 (four) times daily. 05/28/22  Yes Margarette Canada, NP  ipratropium-albuterol (DUONEB) 0.5-2.5 (3) MG/3ML SOLN Take 3 mLs by nebulization every 4 (four) hours as needed. Patient taking differently: Take 3 mLs by nebulization every 4 (four) hours as needed. Last dose: 730 am. 09/05/21  Yes Hosie Poisson, MD  JARDIANCE 10 MG TABS tablet Take 10 mg by mouth daily. 09/07/21  Yes [provider]  Lancets MISC SMARTSIG:Topical 09/07/21  Yes [provider]  latanoprost (XALATAN) 0.005 % ophthalmic solution SMARTSIG:In Eye(s) 11/01/21  Yes [provider]  losartan (COZAAR) 50 MG tablet Take 100 mg by mouth daily.   Yes [provider]  meloxicam (MOBIC) 7.5 MG tablet Take 7.5 mg by mouth daily. 10/07/21  Yes [provider]  metFORMIN (GLUCOPHAGE-XR) 500 MG 24 hr tablet Take 500 mg by mouth daily with breakfast.   Yes [provider]  methocarbamol (ROBAXIN) 500 MG tablet Take by mouth. 06/21/21  Yes [provider]  MIRALAX 17 g packet  06/23/21  Yes [provider]  nystatin (MYCOSTATIN) 100000 UNIT/ML suspension Take 5 mLs (500,000 Units total) by mouth 4 (four) times daily. 11/03/21  Yes Margarette Canada, NP  OZEMPIC, 0.25 OR 0.5 MG/DOSE, 2 MG/3ML SOPN Inject into the skin. 04/26/22  Yes [provider]  pantoprazole (PROTONIX) 40 MG tablet Take 1 tablet (40 mg total) by mouth daily. 06/11/21  Yes Coral Spikes, DO  Respiratory  Therapy Supplies (ADULT MASK LARGE) MISC See admin instructions. use with inhaler 10/17/21  Yes [provider]  Spacer/Aero-Holding Chambers (AEROCHAMBER PLUS) inhaler Use with inhaler 10/15/21  Yes Melynda Ripple, MD  tiotropium (SPIRIVA HANDIHALER) 18 MCG inhalation capsule Place 1 capsule (18 mcg total) into inhaler and inhale daily. 09/05/21  Yes Hosie Poisson, MD  tiZANidine (ZANAFLEX) 4 MG tablet tizanidine 4 mg tablet   Yes [provider]  XOPENEX HFA 45 MCG/ACT inhaler Inhale into the lungs. 11/02/21  Yes [provider]    Family History Family History  Problem Relation Age of Onset   CAD Mother    CAD Father    Breast cancer Neg Hx     Social History Social History   Tobacco Use   Smoking status: Former    Packs/day: 0.50    Years: 25.00    Total pack years: 12.50    Types: Cigarettes    Quit date: 07/06/2014    Years since quitting: 7.9   Smokeless  tobacco: Never  Vaping Use   Vaping Use: Never used  Substance Use Topics   Alcohol use: No    Alcohol/week: 0.0 standard drinks of alcohol   Drug use: No     Allergies   Penicillins   Review of Systems Review of Systems  Constitutional:  Positive for appetite change and fatigue. Negative for chills, diaphoresis and fever.  HENT:  Positive for congestion. Negative for ear pain, rhinorrhea, sinus pain and sore throat.   Respiratory:  Positive for cough and wheezing. Negative for shortness of breath.   Cardiovascular:  Negative for chest pain.  Gastrointestinal:  Positive for abdominal pain (lower) and diarrhea. Negative for constipation, nausea and vomiting.  Genitourinary:  Positive for difficulty urinating and dysuria. Negative for frequency and urgency.  Musculoskeletal:  Positive for arthralgias, back pain and myalgias.  Skin:  Negative for rash.  Neurological:  Positive for dizziness, weakness, light-headedness and headaches. Negative for syncope.  Hematological:  Negative for  adenopathy.     Physical Exam Triage Vital Signs ED Triage Vitals  Enc Vitals Group     BP      Pulse      Resp      Temp      Temp src      SpO2      Weight      Height      Head Circumference      Peak Flow      Pain Score      Pain Loc      Pain Edu?      Excl. in [place]?    No data found.  Updated Vital Signs BP 101/65 (BP Location: Right Arm)   Pulse 69   Temp 98 F (36.7 C) (Oral)   Resp 18   Ht 5' 3"  (1.6 m)   Wt 167 lb (75.8 kg)   SpO2 97%   BMI 29.58 kg/m       Physical Exam Vitals and nursing note reviewed.  Constitutional:      General: She is not in acute distress.    Appearance: Normal appearance. She is ill-appearing. She is not toxic-appearing.     Comments: Patient is sitting in a wheelchair.  Unable to stand due to leg weakness.  HENT:     Head: Normocephalic and atraumatic.     Nose: Nose normal.     Mouth/Throat:     Mouth: Mucous membranes are dry.     Pharynx: Oropharynx is clear.  Eyes:     General: No scleral icterus.       Right eye: No discharge.        Left eye: No discharge.     Conjunctiva/sclera: Conjunctivae normal.  Cardiovascular:     Rate and Rhythm: Normal rate and regular rhythm.     Heart sounds: Normal heart sounds.  Pulmonary:     Effort: Pulmonary effort is normal. No respiratory distress.     Comments: No respiratory distress but diminished breath sounds throughout. Abdominal:     Palpations: Abdomen is soft.     Tenderness: There is abdominal tenderness (lower, generalized).  Musculoskeletal:     Cervical back: Neck supple.  Skin:    General: Skin is dry.  Neurological:     General: No focal deficit present.     Mental Status: She is alert and oriented to person, place, and time. Mental status is at baseline.     Motor: Weakness present.     Coordination:  Coordination normal.  Psychiatric:        Mood and Affect: Mood normal.        Behavior: Behavior normal.        Thought Content: Thought content  normal.      UC Treatments / Results  Labs (all labs ordered are listed, but only abnormal results are displayed) Labs Reviewed  URINALYSIS, ROUTINE W REFLEX MICROSCOPIC  CBG MONITORING, ED  CBG MONITORING, ED    EKG   Radiology DG Chest 2 View  Result Date: 06/04/2022 CLINICAL DATA:  COVID.  Cough for 10 days.  History of COPD. EXAM: CHEST - 2 VIEW COMPARISON:  Chest two views 05/28/2022 and 11/03/2021; CT chest 07/09/2018 FINDINGS: There is again flattening of the diaphragms with moderate to high-grade hyperinflation. Cardiac silhouette and mediastinal contours are within normal limits with moderate calcification within the aortic arch. There are again increased lucencies within the upper lungs consistent with the centrilobular emphysematous changes seen on prior CT. No acute airspace opacity. No pleural effusion pneumothorax. Moderate multilevel degenerative disc changes of the thoracic spine. IMPRESSION: 1. No acute cardiopulmonary process. 2. Moderate to high-grade hyperinflation of the lungs with increased lucencies within the upper lungs consistent with centrilobular emphysematous changes seen on prior CT. Electronically Signed   By: Yvonne Kendall M.D.   On: 06/04/2022 13:37    Procedures Procedures (including critical care time)  Medications Ordered in UC Medications - No data to display  Initial Impression / Assessment and Plan / UC Course  I have reviewed the triage vital signs and the nursing notes.  Pertinent labs & imaging results that were available during my care of the patient were reviewed by me and considered in my medical decision making (see chart for details).   72 year old female with a known positive COVID test diagnosed here 1 week ago presents for continued symptoms including cough, congestion, diarrhea.  Patient also has history of lumbar radiculopathy and spinal stenosis and is reporting back and hip pain.  BP is low at 85/59.  Other vitals are normal and  stable.  She is ill-appearing.  She is sitting in wheelchair.  Unable to stand up because she says her legs hurt too much and are too weak.  On examination her mouth is very dry.  Heart rate is normal and regular rhythm.  She is in no respiratory distress.  Her lung sounds are diminished throughout.  She has soft abdomen with generalized tenderness throughout the lower abdomen.  Chest x-ray performed today.  No evidence of pneumonia.  Fingerstick glucose is 407.  Patient has not been eating.  Urinalysis collected as she has complained of some dysuria.  Recheck of BP is 101/65.  Patient reports feeling progressively worse each day.  I have concerns that patient is dehydrated and now she is very hyperglycemic and may be having a COPD exacerbation.  Advised patient the best place for her would be in the emergency department.  She needs insulin, IV fluids, likely electrolyte replacement.  Pending UA but she may also have a UTI.  Patient is reluctantly agreeable to go to [facility] for evaluation.  She reports she will go by personal vehicle.  She is leaving in stable condition at this time.  UA without signs of UTI.  Large hemoglobin as well as glucose seen.  This was of gravity is less than 1.005 and urine is hazy.  There are white blood cell clumps seen in many bacteria.  Still possible UTI.  Patient to  follow-up at [facility] regarding this as well as her other complaints including dizziness/weakness, dehydration, and COPD exacerbation.  Her husband is taking her to [facility].  Suspect a stay in the ER for several hours if not admission.   Final Clinical Impressions(s) / UC Diagnoses   Final diagnoses:  Generalized weakness  Dehydration  Hypotension, unspecified hypotension type  COVID-19  Centrilobular emphysema (HCC)  Diarrhea, unspecified type  Hyperglycemia  Type 2 diabetes mellitus without complication, unspecified whether long term insulin use ([place])     Discharge Instructions      -You are very  dehydrated and needs fluids.  I am also concerned about your leg weakness and overall weakness. - Your blood pressure has been low in the clinic today.  Given that you do have COVID-19, COPD and are becoming more weak and feeling worse, you should follow-up in the emergency department right away.  They will likely give you fluids and check your electrolytes.  Since you have had diarrhea and fluid loss, you likely have low potassium.  This may need to be replaced. - You also could have a UTI.  Make sure you mention this when you go to the ER. - Your chest x-ray did not show pneumonia.  You have been advised to follow up immediately in the emergency department for concerning signs.symptoms. If you declined EMS transport, please have a family member take you directly to the ED at this time. Do not delay. Based on concerns about condition, if you do not follow up in th e ED, you may risk poor outcomes including worsening of condition, delayed treatment and potentially life threatening issues. If you have declined to go to the ED at this time, you should call your PCP immediately to set up a follow up appointment.  Go to ED for red flag symptoms, including; fevers you cannot reduce with Tylenol/Motrin, severe headaches, vision changes, numbness/weakness in part of the body, lethargy, confusion, intractable vomiting, severe dehydration, chest pain, breathing difficulty, severe persistent abdominal or pelvic pain, signs of severe infection (increased redness, swelling of an area), feeling faint or passing out, dizziness, etc. You should especially go to the ED for sudden acute worsening of condition if you do not elect to go at this time.      ED Prescriptions   None    PDMP not reviewed this encounter.   Danton Clap, PA-C 06/04/22 1502

## 2022-06-04 NOTE — Assessment & Plan Note (Signed)
This has resolved.  The patient did have E. coli growing in urine culture.  Sensitivities still pending.  I called the pharmacy and called in Keflex 500 mg 3 times daily for 5 days we will treat for urinary tract infection.  The patient does complain of burning on urination.

## 2022-06-05 DIAGNOSIS — U071 COVID-19: Secondary | ICD-10-CM

## 2022-06-05 DIAGNOSIS — I129 Hypertensive chronic kidney disease with stage 1 through stage 4 chronic kidney disease, or unspecified chronic kidney disease: Secondary | ICD-10-CM | POA: Diagnosis present

## 2022-06-05 DIAGNOSIS — N179 Acute kidney failure, unspecified: Secondary | ICD-10-CM | POA: Diagnosis present

## 2022-06-05 DIAGNOSIS — I10 Essential (primary) hypertension: Secondary | ICD-10-CM

## 2022-06-05 DIAGNOSIS — B9620 Unspecified Escherichia coli [E. coli] as the cause of diseases classified elsewhere: Secondary | ICD-10-CM | POA: Diagnosis present

## 2022-06-05 DIAGNOSIS — Z8249 Family history of ischemic heart disease and other diseases of the circulatory system: Secondary | ICD-10-CM | POA: Diagnosis not present

## 2022-06-05 DIAGNOSIS — M5416 Radiculopathy, lumbar region: Secondary | ICD-10-CM | POA: Diagnosis present

## 2022-06-05 DIAGNOSIS — E1165 Type 2 diabetes mellitus with hyperglycemia: Secondary | ICD-10-CM

## 2022-06-05 DIAGNOSIS — J441 Chronic obstructive pulmonary disease with (acute) exacerbation: Secondary | ICD-10-CM

## 2022-06-05 DIAGNOSIS — R1031 Right lower quadrant pain: Secondary | ICD-10-CM

## 2022-06-05 DIAGNOSIS — E871 Hypo-osmolality and hyponatremia: Secondary | ICD-10-CM

## 2022-06-05 DIAGNOSIS — Z7985 Long-term (current) use of injectable non-insulin antidiabetic drugs: Secondary | ICD-10-CM | POA: Diagnosis not present

## 2022-06-05 DIAGNOSIS — R1032 Left lower quadrant pain: Secondary | ICD-10-CM

## 2022-06-05 DIAGNOSIS — E870 Hyperosmolality and hypernatremia: Secondary | ICD-10-CM

## 2022-06-05 DIAGNOSIS — N1831 Chronic kidney disease, stage 3a: Secondary | ICD-10-CM | POA: Diagnosis present

## 2022-06-05 DIAGNOSIS — I959 Hypotension, unspecified: Secondary | ICD-10-CM | POA: Diagnosis present

## 2022-06-05 DIAGNOSIS — Z880 Allergy status to penicillin: Secondary | ICD-10-CM | POA: Diagnosis not present

## 2022-06-05 DIAGNOSIS — E1122 Type 2 diabetes mellitus with diabetic chronic kidney disease: Secondary | ICD-10-CM | POA: Diagnosis present

## 2022-06-05 DIAGNOSIS — E875 Hyperkalemia: Principal | ICD-10-CM

## 2022-06-05 DIAGNOSIS — Z87891 Personal history of nicotine dependence: Secondary | ICD-10-CM | POA: Diagnosis not present

## 2022-06-05 DIAGNOSIS — K219 Gastro-esophageal reflux disease without esophagitis: Secondary | ICD-10-CM | POA: Diagnosis present

## 2022-06-05 DIAGNOSIS — E860 Dehydration: Secondary | ICD-10-CM | POA: Diagnosis present

## 2022-06-05 DIAGNOSIS — Z7984 Long term (current) use of oral hypoglycemic drugs: Secondary | ICD-10-CM | POA: Diagnosis not present

## 2022-06-05 DIAGNOSIS — Z7951 Long term (current) use of inhaled steroids: Secondary | ICD-10-CM | POA: Diagnosis not present

## 2022-06-05 DIAGNOSIS — J432 Centrilobular emphysema: Secondary | ICD-10-CM | POA: Diagnosis present

## 2022-06-05 DIAGNOSIS — Z90710 Acquired absence of both cervix and uterus: Secondary | ICD-10-CM | POA: Diagnosis not present

## 2022-06-05 DIAGNOSIS — N390 Urinary tract infection, site not specified: Secondary | ICD-10-CM | POA: Diagnosis present

## 2022-06-05 DIAGNOSIS — A0839 Other viral enteritis: Secondary | ICD-10-CM | POA: Diagnosis present

## 2022-06-05 LAB — COMPREHENSIVE METABOLIC PANEL
ALT: 23 U/L (ref 0–44)
AST: 16 U/L (ref 15–41)
Albumin: 3.3 g/dL — ABNORMAL LOW (ref 3.5–5.0)
Alkaline Phosphatase: 58 U/L (ref 38–126)
Anion gap: 8 (ref 5–15)
BUN: 38 mg/dL — ABNORMAL HIGH (ref 8–23)
CO2: 25 mmol/L (ref 22–32)
Calcium: 9.6 mg/dL (ref 8.9–10.3)
Chloride: 99 mmol/L (ref 98–111)
Creatinine, Ser: 1.13 mg/dL — ABNORMAL HIGH (ref 0.44–1.00)
GFR, Estimated: 52 mL/min — ABNORMAL LOW (ref >60)
Glucose, Bld: 217 mg/dL — ABNORMAL HIGH (ref 70–99)
Potassium: 5.8 mmol/L — ABNORMAL HIGH (ref 3.5–5.1)
Sodium: 132 mmol/L — ABNORMAL LOW (ref 135–145)
Total Bilirubin: 1 mg/dL (ref 0.3–1.2)
Total Protein: 6.6 g/dL (ref 6.5–8.1)

## 2022-06-05 LAB — CBC
HCT: 45.4 % (ref 36.0–46.0)
Hemoglobin: 15.1 g/dL — ABNORMAL HIGH (ref 12.0–15.0)
MCH: 29.2 pg (ref 26.0–34.0)
MCHC: 33.3 g/dL (ref 30.0–36.0)
MCV: 87.6 fL (ref 80.0–100.0)
Platelets: 257 10*3/uL (ref 150–400)
RBC: 5.18 MIL/uL — ABNORMAL HIGH (ref 3.87–5.11)
RDW: 13.4 % (ref 11.5–15.5)
WBC: 11.5 10*3/uL — ABNORMAL HIGH (ref 4.0–10.5)
nRBC: 0 % (ref 0.0–0.2)

## 2022-06-05 LAB — POTASSIUM: Potassium: 6 mmol/L — ABNORMAL HIGH (ref 3.5–5.1)

## 2022-06-05 LAB — GLUCOSE, CAPILLARY
Glucose-Capillary: 215 mg/dL — ABNORMAL HIGH (ref 70–99)
Glucose-Capillary: 197 mg/dL — ABNORMAL HIGH (ref 70–99)
Glucose-Capillary: 355 mg/dL — ABNORMAL HIGH (ref 70–99)
Glucose-Capillary: 322 mg/dL — ABNORMAL HIGH (ref 70–99)

## 2022-06-05 MED ORDER — CALCIUM GLUCONATE-NACL 1-0.675 GM/50ML-% IV SOLN
1.0000 g | Freq: Once | INTRAVENOUS | Status: AC
  Administered 2022-06-05: 1000 mg via INTRAVENOUS
  Filled 2022-06-05: qty 50

## 2022-06-05 MED ORDER — ASPIRIN 81 MG PO TBEC
81.0000 mg | DELAYED_RELEASE_TABLET | Freq: Every day | ORAL | Status: DC
  Administered 2022-06-05 – 2022-06-06 (×2): 81 mg via ORAL
  Filled 2022-06-05 (×2): qty 1

## 2022-06-05 MED ORDER — INSULIN ASPART 100 UNIT/ML IV SOLN
10.0000 [IU] | Freq: Once | INTRAVENOUS | Status: AC
  Administered 2022-06-05: 10 [IU] via INTRAVENOUS
  Filled 2022-06-05: qty 0.1

## 2022-06-05 MED ORDER — SODIUM BICARBONATE 8.4 % IV SOLN
25.0000 meq | Freq: Once | INTRAVENOUS | Status: AC
  Administered 2022-06-05: 25 meq via INTRAVENOUS
  Filled 2022-06-05: qty 50

## 2022-06-05 MED ORDER — METHOCARBAMOL 500 MG PO TABS: 500.0000 mg | ORAL_TABLET | Freq: Three times a day (TID) | ORAL | Status: DC | PRN

## 2022-06-05 MED ORDER — INSULIN DETEMIR 100 UNIT/ML ~~LOC~~ SOLN
10.0000 [IU] | Freq: Every day | SUBCUTANEOUS | Status: DC
  Filled 2022-06-05: qty 0.1

## 2022-06-05 MED ORDER — FLUTICASONE PROPIONATE 50 MCG/ACT NA SUSP
1.0000 | Freq: Every day | NASAL | Status: DC
  Administered 2022-06-06: 1 via NASAL
  Filled 2022-06-05: qty 16

## 2022-06-05 MED ORDER — PANTOPRAZOLE SODIUM 40 MG PO TBEC
40.0000 mg | DELAYED_RELEASE_TABLET | Freq: Every day | ORAL | Status: DC
  Administered 2022-06-05 – 2022-06-06 (×2): 40 mg via ORAL
  Filled 2022-06-05 (×2): qty 1

## 2022-06-05 MED ORDER — DICLOFENAC SODIUM 1 % EX GEL: 2.0000 g | Freq: Four times a day (QID) | CUTANEOUS | Status: DC | PRN

## 2022-06-05 MED ORDER — SODIUM ZIRCONIUM CYCLOSILICATE 10 G PO PACK
10.0000 g | PACK | Freq: Once | ORAL | Status: AC
  Administered 2022-06-05: 10 g via ORAL
  Filled 2022-06-05: qty 1

## 2022-06-05 MED ORDER — INSULIN DETEMIR 100 UNIT/ML ~~LOC~~ SOLN
15.0000 [IU] | Freq: Every day | SUBCUTANEOUS | Status: DC
  Administered 2022-06-05: 15 [IU] via SUBCUTANEOUS
  Filled 2022-06-05: qty 0.15

## 2022-06-05 MED ORDER — METHYLPREDNISOLONE SODIUM SUCC 40 MG IJ SOLR
40.0000 mg | Freq: Every day | INTRAMUSCULAR | Status: DC
  Administered 2022-06-05: 40 mg via INTRAVENOUS
  Filled 2022-06-05: qty 1

## 2022-06-05 MED ORDER — AZELASTINE HCL 0.1 % NA SOLN
1.0000 | Freq: Two times a day (BID) | NASAL | Status: DC
  Administered 2022-06-05 – 2022-06-06 (×2): 1 via NASAL
  Filled 2022-06-05: qty 30

## 2022-06-05 MED ORDER — LATANOPROST 0.005 % OP SOLN
1.0000 [drp] | Freq: Every day | OPHTHALMIC | Status: DC
  Administered 2022-06-05: 1 [drp] via OPHTHALMIC
  Filled 2022-06-05 (×2): qty 2.5

## 2022-06-05 MED ORDER — DEXTROSE 50 % IV SOLN
25.0000 g | Freq: Once | INTRAVENOUS | Status: AC
  Administered 2022-06-05: 25 g via INTRAVENOUS
  Filled 2022-06-05: qty 50

## 2022-06-05 NOTE — Care Management Obs Status (Signed)
[place] NOTIFICATION   Patient Details  Name: Katherine Carter MRN: [id number] Date of Birth: May 14, 1951   Medicare Observation Status Notification Given:  Yes    Conception Oms, RN 06/05/2022, 10:05 AM

## 2022-06-05 NOTE — Assessment & Plan Note (Addendum)
Hemoglobin A1c 8.5.  I started low-dose Levemir insulin here in the hospital.  The patient did not want to switch to Levemir and wanted to stick with her home regimen.  Follow-up closely as outpatient.  Sugars will be higher for the next few days while on prednisone.

## 2022-06-05 NOTE — Inpatient Diabetes Management (Signed)
Inpatient Diabetes Program Recommendations  AACE/ADA: New Consensus Statement on Inpatient Glycemic Control   Target Ranges:  Prepandial:   less than 140 mg/dL      Peak postprandial:   less than 180 mg/dL (1-2 hours)      Critically ill patients:  140 - 180 mg/dL    Latest Reference Range & Units 06/05/22 04:32  Glucose 70 - 99 mg/dL 217 (H)    Latest Reference Range & Units 06/04/22 13:56 06/04/22 18:16  Glucose-Capillary 70 - 99 mg/dL 407 (H) 291 (H)    Latest Reference Range & Units 06/04/22 15:37  Potassium 3.5 - 5.1 mmol/L 6.4 (HH)  CO2 22 - 32 mmol/L 19 (L)  Glucose 70 - 99 mg/dL 448 (H)  Anion gap 5 - 15  13    Latest Reference Range & Units 09/03/21 10:47 06/04/22 15:37  Hemoglobin A1C 4.8 - 5.6 % 9.3 (H) 8.5 (H)   Review of Glycemic Control  Diabetes history: DM2 Outpatient Diabetes medications: Jardiance 10 mg daily, Metformin XR 500 mg QAM, Ozempic 0.25 mg Qweek Current orders for Inpatient glycemic control: Novolog 0-15 units TID with meals  Inpatient Diabetes Program Recommendations:    Insulin: Please consider ordering Semglee 7 units Q24H and adding Novolog 0-5 units QHS for bedtime correction.  Thanks, Barnie Alderman, RN, MSN, [place] Diabetes Coordinator Inpatient Diabetes Program [phone] (Team Pager from 8am to [place])

## 2022-06-05 NOTE — Progress Notes (Signed)
  Progress Note   Patient: Katherine Carter YWV:[id number] DOB: 1951-01-21 DOA: 06/04/2022     0 DOS: the patient was seen and examined on 06/05/2022     Assessment and Plan: * Hyperkalemia Hyperkalemia with peaked T waves treated in the emergency room.  Patient with persistent hyperkalemia we will treat again today with Lokelma, calcium gluconate, sodium bicarb, insulin and D50.  Recheck potassium again today and again tomorrow morning.  Continue to hold losartan.  AKI (acute kidney injury) ([place]) Acute kidney injury on chronic kidney disease stage IIIa .  Creatinine 1.44 on presentation down to 1.13.  Hold nephrotoxic agents including home losartan and HCTZ  Uncontrolled type 2 diabetes mellitus with hyperglycemia, without long-term current use of insulin (HCC) Hemoglobin A1c 8.5.  Start low-dose Levemir insulin at night.  Continue sliding scale.  Holding oral medications.  COPD with acute exacerbation (HCC) Start Solu-Medrol.  Continue inhalers.  Acute bilateral lower abdominal pain Seems better today.  Hyperosmolar hyponatremia Sodium low with elevated sugars.  HTN (hypertension) Continue amlodipine.  Holding Cozaar and hydrochlorothiazide with AKI  COVID-19 virus infection COVID-positive on 05/28/2022.  Continue isolation while here.        Subjective: Patient feeling okay.  Recent diagnosis of COVID infection.  Came in with hip and abdominal pain and generalized weakness and found to have hyperkalemia.  Physical Exam: Vitals:   06/04/22 1715 06/04/22 1939 06/04/22 2330 06/05/22 0813  BP: 134/74 (!) 141/68 (!) 131/92 (!) 137/95  Pulse: 67 81 82 92  Resp: 18 18 16 18   Temp:  98.5 F (36.9 C) 98.4 F (36.9 C) 98.8 F (37.1 C)  TempSrc:      SpO2: 96% 98% 95% 95%  Weight:      Height:       Physical Exam HENT:     Head: Normocephalic.     Mouth/Throat:     Pharynx: No oropharyngeal exudate.  Eyes:     General: Lids are normal.     Conjunctiva/sclera:  Conjunctivae normal.  Cardiovascular:     Rate and Rhythm: Normal rate and regular rhythm.     Heart sounds: Normal heart sounds, S1 normal and S2 normal.  Pulmonary:     Breath sounds: Examination of the right-middle field reveals decreased breath sounds. Examination of the left-middle field reveals decreased breath sounds. Examination of the right-lower field reveals decreased breath sounds and wheezing. Examination of the left-lower field reveals decreased breath sounds and wheezing. Decreased breath sounds and wheezing present. No rhonchi or rales.  Abdominal:     Palpations: Abdomen is soft.     Tenderness: There is no abdominal tenderness.  Musculoskeletal:     Right lower leg: No swelling.     Left lower leg: No swelling.  Skin:    General: Skin is warm.     Findings: No rash.  Neurological:     Mental Status: She is alert and oriented to person, place, and time.     Data Reviewed: Hemoglobin A1c 8.5, sodium 132, potassium 5.8, creatinine 1.13, hemoglobin 15.1, white blood cell count 11.5  Family Communication: Updated husband on the phone  Disposition: Status is: Inpatient Remains inpatient appropriate because: Treating hyperkalemia with IV and oral medications  Planned Discharge Destination: Home    Time spent: 28 minutes  Author: Loletha Grayer, MD 06/05/2022 1:47 PM  For on call review [email or web address].

## 2022-06-05 NOTE — Assessment & Plan Note (Addendum)
Lungs much improved.  Switch to p.o. prednisone

## 2022-06-05 NOTE — TOC Initial Note (Signed)
Transition of Care [facility]) - Initial/Assessment Note    Patient Details  Name: Katherine Carter MRN: [id number] Date of Birth: September 15, 1950  Transition of Care [facility]) CM/SW Contact:    Conception Oms, RN Phone Number: 06/05/2022, 10:12 AM  Clinical Narrative:                  Transition of Care (TOC) Screening Note   Patient Details  Name: Katherine Carter Date of Birth: 1950-10-22   Transition of Care [facility]) CM/SW Contact:    Conception Oms, RN Phone Number: 06/05/2022, 10:13 AM  [place] completed and verbally reviewed, placed on chart to give patient  She has P[facility] with [facility]  Transition of Care Department [facility]) has reviewed patient and no TOC needs have been identified at this time. We will continue to monitor patient advancement through interdisciplinary progression rounds. If new patient transition needs arise, please place a TOC consult.    Expected Discharge Plan: Home/Self Care Barriers to Discharge: Continued Medical Work up   Patient Goals and CMS Choice        Expected Discharge Plan and Services Expected Discharge Plan: Home/Self Care                                              Prior Living Arrangements/Services                       Activities of Daily Living Home Assistive Devices/Equipment: Cane (specify quad or straight) ADL Screening (condition at time of admission) Patient's cognitive ability adequate to safely complete daily activities?: Yes Is the patient deaf or have difficulty hearing?: No Does the patient have difficulty seeing, even when wearing glasses/contacts?: No Does the patient have difficulty concentrating, remembering, or making decisions?: No Patient able to express need for assistance with ADLs?: Yes Does the patient have difficulty dressing or bathing?: No Independently performs ADLs?: Yes (appropriate for developmental age) Does the patient have difficulty walking or  climbing stairs?: No Weakness of Legs: None Weakness of Arms/Hands: None  Permission Sought/Granted                  Emotional Assessment              Admission diagnosis:  Hyperkalemia [E87.5] AKI (acute kidney injury) ([place]) [N17.9] Type 2 diabetes mellitus with hyperglycemia, with long-term current use of insulin ([place]) [E11.65, Z79.4] Patient Active Problem List   Diagnosis Date Noted   Hyperkalemia 06/04/2022   AKI (acute kidney injury) ([place]) 06/04/2022   Hyperglycemia 06/04/2022   Hyperosmolar hyponatremia 06/04/2022   Acute bilateral lower abdominal pain 06/04/2022   Stage 3 severe COPD by GOLD classification ([place]) 06/04/2022   Pure hypercholesterolemia    Elevated troponin    Acute respiratory failure with hypoxemia ([place]) 09/02/2021   Sepsis ([place]) 07/08/2018   CAP (community acquired pneumonia) 07/08/2018   Diabetes ([place]) 07/08/2018   HTN (hypertension) 07/08/2018   COPD exacerbation ([place]) 07/08/2018   Acute respiratory failure with hypoxia and hypercapnia ([place]) 12/10/2015   PCP:  Donnie Coffin, MD Pharmacy:   [id number] [place], [place] - [address] [place] [address] Phone: [phone] Fax: [place], [place] - [place] [address] Phone:  [phone] Fax: [phone]     Social Determinants of Health (SDOH) Interventions    Readmission Risk Interventions     No data to display

## 2022-06-05 NOTE — Progress Notes (Signed)
       CROSS COVER NOTE  NAME: Katherine Carter MRN: [id number] DOB : 1950-09-17    Date of Service   06/05/2022   HPI/Events of Note   AM Potassium is 5.8 up from 5.6 at ~2200 last night.  Interventions   Assessment/Plan:  Hyperkalemia Lokelma 10g x1     This document was prepared using Dragon voice recognition software and may include unintentional dictation errors.  Neomia Glass DNP, MHA, FNP-BC Nurse Practitioner [facility] Pager [phone]

## 2022-06-05 NOTE — Assessment & Plan Note (Signed)
COVID-positive on 05/28/2022.  Continue isolation while here.

## 2022-06-05 NOTE — Plan of Care (Signed)
  Problem: Nutrition: Goal: Adequate nutrition will be maintained Outcome: Progressing   Problem: Elimination: Goal: Will not experience complications related to bowel motility Outcome: Progressing Goal: Will not experience complications related to urinary retention Outcome: Progressing   Problem: Pain Managment: Goal: General experience of comfort will improve Outcome: Progressing   Problem: Health Behavior/Discharge Planning: Goal: Ability to manage health-related needs will improve Outcome: Progressing

## 2022-06-06 ENCOUNTER — Telehealth (HOSPITAL_COMMUNITY): Payer: Self-pay | Admitting: Emergency Medicine

## 2022-06-06 DIAGNOSIS — N390 Urinary tract infection, site not specified: Secondary | ICD-10-CM

## 2022-06-06 DIAGNOSIS — B9620 Unspecified Escherichia coli [E. coli] as the cause of diseases classified elsewhere: Secondary | ICD-10-CM

## 2022-06-06 DIAGNOSIS — R7989 Other specified abnormal findings of blood chemistry: Secondary | ICD-10-CM

## 2022-06-06 LAB — GLUCOSE, CAPILLARY: Glucose-Capillary: 222 mg/dL — ABNORMAL HIGH (ref 70–99)

## 2022-06-06 LAB — BASIC METABOLIC PANEL
Anion gap: 8 (ref 5–15)
BUN: 47 mg/dL — ABNORMAL HIGH (ref 8–23)
CO2: 26 mmol/L (ref 22–32)
Calcium: 9.5 mg/dL (ref 8.9–10.3)
Chloride: 101 mmol/L (ref 98–111)
Creatinine, Ser: 1.05 mg/dL — ABNORMAL HIGH (ref 0.44–1.00)
GFR, Estimated: 57 mL/min — ABNORMAL LOW (ref >60)
Glucose, Bld: 253 mg/dL — ABNORMAL HIGH (ref 70–99)
Potassium: 4.6 mmol/L (ref 3.5–5.1)
Sodium: 135 mmol/L (ref 135–145)

## 2022-06-06 LAB — URINE CULTURE: Culture: >=100000 — AB

## 2022-06-06 MED ORDER — SULFAMETHOXAZOLE-TRIMETHOPRIM 800-160 MG PO TABS: 1.0000 | ORAL_TABLET | Freq: Two times a day (BID) | ORAL | 0 refills | Status: AC

## 2022-06-06 MED ORDER — INFLUENZA VAC A&B SA ADJ QUAD 0.5 ML IM PRSY: 0.5000 mL | PREFILLED_SYRINGE | INTRAMUSCULAR | Status: DC

## 2022-06-06 MED ORDER — AMLODIPINE BESYLATE 5 MG PO TABS
2.5000 mg | ORAL_TABLET | Freq: Every day | ORAL | Status: DC
  Administered 2022-06-06: 2.5 mg via ORAL
  Filled 2022-06-06: qty 1

## 2022-06-06 MED ORDER — PREDNISONE 20 MG PO TABS
20.0000 mg | ORAL_TABLET | Freq: Every day | ORAL | Status: DC
  Administered 2022-06-06: 20 mg via ORAL
  Filled 2022-06-06: qty 1

## 2022-06-06 MED ORDER — PREDNISONE 20 MG PO TABS: 20.0000 mg | ORAL_TABLET | Freq: Every day | ORAL | 0 refills | Status: AC

## 2022-06-06 NOTE — Discharge Summary (Signed)
Physician Discharge Summary   Patient: Katherine Carter MRN: [id number] DOB: 22-Dec-1950  Admit date:     06/04/2022  Discharge date: 06/06/22  Discharge Physician: Loletha Grayer   PCP: Donnie Coffin, MD   Recommendations at discharge:   Follow-up PCP 5 days  Discharge Diagnoses: Principal Problem:   Hyperkalemia Active Problems:   AKI (acute kidney injury) ([place])   COPD with acute exacerbation ([place])   Uncontrolled type 2 diabetes mellitus with hyperglycemia, without long-term current use of insulin (HCC)   Acute bilateral lower abdominal pain   HTN (hypertension)   COVID-19 virus infection   Pseudohyponatremia   E-coli UTI   Hospital Course: 71 year old female with past medical history of hypertension, type 2 diabetes mellitus, GERD, COPD.  She  was recently diagnosed with COVID-19 infection on 05/28/2022.  She came in with hip and abdominal pain and generalized weakness.  She was found to have hyperkalemia and acute kidney injury and admitted to the hospital.  Initially was given IV fluid hydration and treatment for potassium of 6.4.  Creatinine 1.4 upon admission.  She required a few treatment doses of Lokelma for hyperkalemia and IV medications for hyperkalemia.  Potassium upon discharge 4.6.  Low potassium diet and continue to hold Cozaar as outpatient.  Creatinine did improve to 1.05.  Patient was feeling better upon discharge and discharged home in stable condition.  Recommend checking a BMP and follow-up appointment.  Assessment and Plan: * Hyperkalemia Hyperkalemia with peaked T waves treated in the emergency room.  Patient with persistent hyperkalemia we treated twice on 06/05/2022.  With Lokelma, calcium gluconate, sodium bicarb.   Continue to hold losartan.  Potassium upon discharge 4.6.  AKI (acute kidney injury) ([place]) Acute kidney injury on chronic kidney disease stage IIIa .  Creatinine 1.44 on presentation down to 1.05.  Hold nephrotoxic agents including home  losartan and HCTZ.  Uncontrolled type 2 diabetes mellitus with hyperglycemia, without long-term current use of insulin (HCC) Hemoglobin A1c 8.5.  I started low-dose Levemir insulin here in the hospital.  The patient did not want to switch to Levemir and wanted to stick with her home regimen.  Follow-up closely as outpatient.  Sugars will be higher for the next few days while on prednisone.  COPD with acute exacerbation ([place]) Lungs much improved.  Switch to p.o. prednisone  Acute bilateral lower abdominal pain This has resolved.  The patient did have E. coli growing in urine culture.  Sensitivities still pending.  I called the pharmacy and called in Keflex 500 mg 3 times daily for 5 days we will treat for urinary tract infection.  The patient does complain of burning on urination.  HTN (hypertension) Continue amlodipine.  Holding Cozaar and hydrochlorothiazide with AKI  Pseudohyponatremia Pseudohyponatremia secondary to elevated sugars.  Sodium 127 on presentation 135 upon discharge.  COVID-19 virus infection COVID-positive on 05/28/2022.  Continue isolation while here.         Consultants: None Procedures performed: None Disposition: Home Diet recommendation:  Cardiac and Carb modified diet DISCHARGE MEDICATION: Allergies as of 06/06/2022       Reactions   Penicillins Hives, Other (See Comments)   Has patient had a PCN reaction causing immediate rash, facial/tongue/throat swelling, SOB or lightheadedness with hypotension: No Has patient had a PCN reaction causing severe rash involving mucus membranes or skin necrosis: No Has patient had a PCN reaction that required hospitalization: No Has patient had a PCN reaction occurring within the last 10 years: No If all  of the above answers are "NO", then may proceed with Cephalosporin use.        Medication List     STOP taking these medications    azelastine 0.1 % nasal spray Commonly known as: ASTELIN   BREO ELLIPTA IN    fluticasone 50 MCG/ACT nasal spray Commonly known as: FLONASE   hydrochlorothiazide 12.5 MG capsule Commonly known as: MICROZIDE   latanoprost 0.005 % ophthalmic solution Commonly known as: XALATAN   losartan 100 MG tablet Commonly known as: COZAAR   losartan 50 MG tablet Commonly known as: COZAAR   meloxicam 7.5 MG tablet Commonly known as: MOBIC   nystatin 100000 UNIT/ML suspension Commonly known as: MYCOSTATIN   ondansetron 4 MG tablet Commonly known as: ZOFRAN   tiZANidine 4 MG tablet Commonly known as: ZANAFLEX       TAKE these medications    Accu-Chek Guide test strip Generic drug: glucose blood 1 each daily.   Accu-Chek Guide w/Device Kit USE 1 DEVICE AS DIRECTED THREE TIMES A DAY FOR GLUCOSE MONITORING   Adult Mask Large Misc See admin instructions. use with inhaler   AeroChamber Plus inhaler Use with inhaler   amLODipine 5 MG tablet Commonly known as: NORVASC Take 1 tablet (5 mg total) by mouth daily.   aspirin EC 81 MG tablet Take 81 mg by mouth daily.   atorvastatin 40 MG tablet Commonly known as: LIPITOR Take 1 tablet (40 mg total) by mouth every evening.   benzonatate 100 MG capsule Commonly known as: TESSALON Take 2 capsules (200 mg total) by mouth every 8 (eight) hours.   budesonide-formoterol 160-4.5 MCG/ACT inhaler Commonly known as: SYMBICORT Inhale 2 puffs into the lungs 2 (two) times daily.   guaiFENesin-codeine 100-10 MG/5ML syrup Commonly known as: ROBITUSSIN AC Take 5 mLs by mouth 3 (three) times daily as needed for cough.   ipratropium 0.06 % nasal spray Commonly known as: ATROVENT Place 2 sprays into both nostrils 4 (four) times daily.   ipratropium-albuterol 0.5-2.5 (3) MG/3ML Soln Commonly known as: DUONEB Take 3 mLs by nebulization every 4 (four) hours as needed. What changed: additional instructions   Jardiance 10 MG Tabs tablet Generic drug: empagliflozin Take 10 mg by mouth daily.   Lancets  Misc SMARTSIG:Topical   Accu-Chek Softclix Lancets lancets SMARTSIG:Topical   metFORMIN 500 MG 24 hr tablet Commonly known as: GLUCOPHAGE-XR Take 500 mg by mouth daily with breakfast.   methocarbamol 500 MG tablet Commonly known as: ROBAXIN Take 500 mg by mouth every 6 (six) hours as needed for muscle spasms.   MiraLax 17 g packet Generic drug: polyethylene glycol Take 17 g by mouth daily as needed for mild constipation.   Mucinex DM 30-600 MG Tb12 SMARTSIG:1-2 Tablet(s) By Mouth Every 12 Hours PRN   Ozempic (0.25 or 0.5 MG/DOSE) 2 MG/3ML Sopn Generic drug: Semaglutide(0.25 or 0.5MG/DOS) Inject into the skin.   pantoprazole 40 MG tablet Commonly known as: PROTONIX Take 1 tablet (40 mg total) by mouth daily.   predniSONE 20 MG tablet Commonly known as: DELTASONE Take 1 tablet (20 mg total) by mouth daily with breakfast for 3 days.   Spiriva HandiHaler 18 MCG inhalation capsule Generic drug: tiotropium Place 1 capsule (18 mcg total) into inhaler and inhale daily.   Voltaren Arthritis Pain 1 % Gel Generic drug: diclofenac Sodium Apply 2 g topically 4 (four) times daily.   Xopenex HFA 45 MCG/ACT inhaler Generic drug: levalbuterol Inhale 1 puff into the lungs 3 (three) times daily.  Keflex 500 mg p.o. 3 times daily for 5 days called into pharmacy with E. coli growing in the urine culture.  Follow-up Information     Donnie Coffin, MD. Go on 06/08/2022.   Specialty: Family Medicine Why: @1pm  Contact information: [place] [id number] [phone]                 Discharge Exam: Filed Weights   06/04/22 1535  Weight: 70.8 kg   Physical Exam HENT:     Head: Normocephalic.     Mouth/Throat:     Pharynx: No oropharyngeal exudate.  Eyes:     General: Lids are normal.     Conjunctiva/sclera: Conjunctivae normal.  Cardiovascular:     Rate and Rhythm: Normal rate and regular rhythm.     Heart sounds: Normal heart sounds,  S1 normal and S2 normal.  Pulmonary:     Breath sounds: No decreased breath sounds, wheezing, rhonchi or rales.  Abdominal:     Palpations: Abdomen is soft.     Tenderness: There is no abdominal tenderness.  Musculoskeletal:     Right lower leg: No swelling.     Left lower leg: No swelling.  Skin:    General: Skin is warm.     Findings: No rash.  Neurological:     Mental Status: She is alert and oriented to person, place, and time.      Condition at discharge: stable  The results of significant diagnostics from this hospitalization (including imaging, microbiology, ancillary and laboratory) are listed below for reference.   Imaging Studies: DG Chest 2 View  Result Date: 06/04/2022 CLINICAL DATA:  COVID.  Cough for 10 days.  History of COPD. EXAM: CHEST - 2 VIEW COMPARISON:  Chest two views 05/28/2022 and 11/03/2021; CT chest 07/09/2018 FINDINGS: There is again flattening of the diaphragms with moderate to high-grade hyperinflation. Cardiac silhouette and mediastinal contours are within normal limits with moderate calcification within the aortic arch. There are again increased lucencies within the upper lungs consistent with the centrilobular emphysematous changes seen on prior CT. No acute airspace opacity. No pleural effusion pneumothorax. Moderate multilevel degenerative disc changes of the thoracic spine. IMPRESSION: 1. No acute cardiopulmonary process. 2. Moderate to high-grade hyperinflation of the lungs with increased lucencies within the upper lungs consistent with centrilobular emphysematous changes seen on prior CT. Electronically Signed   By: Yvonne Kendall M.D.   On: 06/04/2022 13:37   DG Chest 2 View  Result Date: 05/28/2022 CLINICAL DATA:  Cough. EXAM: CHEST - 2 VIEW COMPARISON:  November 03, 2021 FINDINGS: Mild bibasilar atelectasis, stable. The heart, hila, mediastinum, lungs, and pleura are otherwise unremarkable. Flattening of the diaphragm on the lateral view consistent with  hyperinflation. Emphysematous changes, particularly in the apices. IMPRESSION: 1. Findings of emphysema. Mild atelectasis in the bases. No other acute interval changes. Electronically Signed   By: Dorise Bullion III M.D.   On: 05/28/2022 08:58    Microbiology: Results for orders placed or performed during the hospital encounter of 06/04/22  Urine Culture     Status: Abnormal   Collection Time: 06/04/22  1:56 PM   Specimen: Urine, Clean Catch  Result Value Ref Range Status   Specimen Description   Final    URINE, CLEAN CATCH Performed at [facility], [address], [place], [place] [address]    Special Requests   Final    NONE Performed at [facility], [address],  [place], [place] [address]    Culture >=100,000 COLONIES/mL ESCHERICHIA COLI (A)  Final   Report Status 06/06/2022 FINAL  Final   Organism ID, Bacteria ESCHERICHIA COLI (A)  Final      Susceptibility   Escherichia coli - MIC*    AMPICILLIN 4 SENSITIVE Sensitive     CEFAZOLIN <=4 SENSITIVE Sensitive     CEFEPIME <=0.12 SENSITIVE Sensitive     CEFTRIAXONE <=0.25 SENSITIVE Sensitive     CIPROFLOXACIN >=4 RESISTANT Resistant     GENTAMICIN <=1 SENSITIVE Sensitive     IMIPENEM <=0.25 SENSITIVE Sensitive     NITROFURANTOIN <=16 SENSITIVE Sensitive     TRIMETH/SULFA <=20 SENSITIVE Sensitive     AMPICILLIN/SULBACTAM <=2 SENSITIVE Sensitive     PIP/TAZO <=4 SENSITIVE Sensitive     * >=100,000 COLONIES/mL ESCHERICHIA COLI    Labs: CBC: Recent Labs  Lab 06/04/22 1537 06/05/22 0432  WBC 10.0 11.5*  NEUTROABS 8.4*  --   HGB 15.5* 15.1*  HCT 48.3* 45.4  MCV 88.6 87.6  PLT 271 831   Basic Metabolic Panel: Recent Labs  Lab 06/04/22 1537 06/04/22 2158 06/05/22 0432 06/05/22 1642 06/06/22 0428  NA 127* 132* 132*  --  135  K 6.4* 5.6* 5.8* 6.0* 4.6  CL 95* 98 99  --  101  CO2 19* 25 25  --  26  GLUCOSE 448* 221* 217*  --  253*  BUN 49* 44* 38*  --  47*  CREATININE 1.44* 1.24* 1.13*   --  1.05*  CALCIUM 9.7 9.7 9.6  --  9.5   Liver Function Tests: Recent Labs  Lab 06/04/22 1537 06/05/22 0432  AST 21 16  ALT 26 23  ALKPHOS 73 58  BILITOT 0.9 1.0  PROT 7.6 6.6  ALBUMIN 3.8 3.3*   CBG: Recent Labs  Lab 06/05/22 0814 06/05/22 1137 06/05/22 1703 06/05/22 2130 06/06/22 0851  GLUCAP 215* 197* 355* 322* 222*    Discharge time spent: greater than 30 minutes.  Signed: Loletha Grayer, MD [facility] 06/06/2022

## 2022-06-06 NOTE — Assessment & Plan Note (Signed)
Pseudohyponatremia secondary to elevated sugars.  Sodium 127 on presentation 135 upon discharge.

## 2022-06-06 NOTE — Discharge Instructions (Signed)
Stop cozaar Check BMP lab in follow up appointment

## 2022-06-06 NOTE — Plan of Care (Signed)
  Problem: Nutritional: Goal: Maintenance of adequate nutrition will improve Outcome: Progressing   Problem: Health Behavior/Discharge Planning: Goal: Ability to manage health-related needs will improve Outcome: Progressing   Problem: Clinical Measurements: Goal: Ability to maintain clinical measurements within normal limits will improve Outcome: Progressing Goal: Diagnostic test results will improve Outcome: Progressing   Problem: Elimination: Goal: Will not experience complications related to bowel motility Outcome: Progressing Goal: Will not experience complications related to urinary retention Outcome: Progressing   Problem: Pain Managment: Goal: General experience of comfort will improve Outcome: Progressing

## 2022-06-06 NOTE — Plan of Care (Signed)
Patient discharged per MD orders at this time.All discharge instructions,education & medications reviewed with the patient.Pt expressed understanding and will comply with dc instructions.follow up appointments was also communicated to the patient.no verbal c/o or any ssx of distress.Pt was discharged home with self care per order.Pt was transported home by spouse in a privately owned vehicle. 

## 2022-06-06 NOTE — Inpatient Diabetes Management (Signed)
Inpatient Diabetes Program Recommendations  AACE/ADA: New Consensus Statement on Inpatient Glycemic Control   Target Ranges:  Prepandial:   less than 140 mg/dL      Peak postprandial:   less than 180 mg/dL (1-2 hours)      Critically ill patients:  140 - 180 mg/dL    Latest Reference Range & Units 06/05/22 08:14 06/05/22 11:37 06/05/22 17:03 06/05/22 21:30  Glucose-Capillary 70 - 99 mg/dL 215 (H) 197 (H) 355 (H) 322 (H)   Review of Glycemic Control  Diabetes history: DM2 Outpatient Diabetes medications: Jardiance 10 mg daily, Metformin XR 500 mg QAM, Ozempic 0.25 mg Qweek Current orders for Inpatient glycemic control: Levemir 15 units QHS, Novolog 0-15 units TID with meals; Solumedrol 40 mg daily   Inpatient Diabetes Program Recommendations:    Insulin: If steroids are continued as ordered, please consider increasing Levemir to 18 units QHS, adding Novolog 0-5 units QHS for bedtime correction, and ordering Novolog 3 units TID with meals for meal coverage if patient eats at least 50% of meals.  Thanks, Barnie Alderman, RN, MSN, [place] Diabetes Coordinator Inpatient Diabetes Program [phone] (Team Pager from 8am to [place])

## 2022-09-01 ENCOUNTER — Ambulatory Visit
Admission: EM | Admit: 2022-09-01 | Discharge: 2022-09-01 | Disposition: A | Discharge status: Home / Self Care | Payer: Medicare Other | Attending: Family Medicine | Admitting: Family Medicine

## 2022-09-01 DIAGNOSIS — J441 Chronic obstructive pulmonary disease with (acute) exacerbation: Secondary | ICD-10-CM | POA: Diagnosis present

## 2022-09-01 DIAGNOSIS — Z1152 Encounter for screening for COVID-19: Secondary | ICD-10-CM | POA: Insufficient documentation

## 2022-09-01 LAB — GLUCOSE, CAPILLARY: Glucose-Capillary: 168 mg/dL — ABNORMAL HIGH (ref 70–99)

## 2022-09-01 LAB — RESP PANEL BY RT-PCR (RSV, FLU A&B, COVID)  RVPGX2
Influenza A by PCR: NEGATIVE
Influenza B by PCR: NEGATIVE
Resp Syncytial Virus by PCR: NEGATIVE
SARS Coronavirus 2 by RT PCR: NEGATIVE

## 2022-09-01 MED ORDER — LEVOFLOXACIN 500 MG PO TABS: 500.0000 mg | ORAL_TABLET | Freq: Every day | ORAL | 0 refills | Status: AC

## 2022-09-01 MED ORDER — PREDNISONE 20 MG PO TABS: 40.0000 mg | ORAL_TABLET | Freq: Every day | ORAL | 0 refills | Status: AC

## 2022-09-01 MED ORDER — IPRATROPIUM-ALBUTEROL 0.5-2.5 (3) MG/3ML IN SOLN
3.0000 mL | Freq: Once | RESPIRATORY_TRACT | Status: AC
  Administered 2022-09-01: 3 mL via RESPIRATORY_TRACT

## 2022-09-01 MED ORDER — ONDANSETRON 4 MG PO TBDP: 4.0000 mg | ORAL_TABLET | Freq: Three times a day (TID) | ORAL | 0 refills | Status: DC | PRN

## 2022-09-01 NOTE — ED Provider Notes (Signed)
[facility]    CSN: [id number] Arrival date & time: 09/01/22  1303      History   Chief Complaint Chief Complaint  Patient presents with   Cough    Cough, nausea, and fever. X1 day    HPI Katherine Carter is a 71 y.o. female.   HPI   Katherine Carter presents for cough, wheezing, sore throat.  Temperature was 101.2 F.  She was in the bed all this morning. Has nausea but no vomiting.  She was still sleep and urine incontinent this morning. Has been coughing a lot.  Wheezing at baseline as she sas COPD. Uses oxygen.     Past Medical History:  Diagnosis Date   COPD (chronic obstructive pulmonary disease) ([place])    Diabetes mellitus without complication ([place])    GERD (gastroesophageal reflux disease)    Hypertension     Patient Active Problem List   Diagnosis Date Noted   Pseudohyponatremia 06/06/2022   E-coli UTI    Uncontrolled type 2 diabetes mellitus with hyperglycemia, without long-term current use of insulin ([place]) 06/05/2022   COVID-19 virus infection 06/05/2022   Hyperkalemia 06/04/2022   AKI (acute kidney injury) ([place]) 06/04/2022   Acute bilateral lower abdominal pain 06/04/2022   Pure hypercholesterolemia    Elevated troponin    Acute respiratory failure with hypoxemia ([place]) 09/02/2021   Sepsis ([place]) 07/08/2018   CAP (community acquired pneumonia) 07/08/2018   HTN (hypertension) 07/08/2018   COPD with acute exacerbation ([place]) 07/08/2018   Acute respiratory failure with hypoxia and hypercapnia ([place]) 12/10/2015    Past Surgical History:  Procedure Laterality Date   ABDOMINAL HYSTERECTOMY     COLONOSCOPY WITH PROPOFOL N/A 01/23/2017   Procedure: COLONOSCOPY WITH PROPOFOL;  Surgeon: Lollie Sails, MD;  Location: [facility] ENDOSCOPY;  Service: Endoscopy;  Laterality: N/A;   FOOT SURGERY      OB History   No obstetric history on file.      Home Medications    Prior to Admission medications   Medication Sig Start Date End Date Taking?  Authorizing Provider  ACCU-CHEK GUIDE test strip 1 each daily. 09/07/21  Yes [provider]  Accu-Chek Softclix Lancets lancets SMARTSIG:Topical 09/07/21  Yes [provider]  amLODipine (NORVASC) 5 MG tablet Take 1 tablet (5 mg total) by mouth daily. 09/05/21  Yes Hosie Poisson, MD  aspirin EC 81 MG tablet Take 81 mg by mouth daily. 03/15/22  Yes [provider]  atorvastatin (LIPITOR) 40 MG tablet Take 1 tablet (40 mg total) by mouth every evening. 09/05/21  Yes Hosie Poisson, MD  benzonatate (TESSALON) 100 MG capsule Take 2 capsules (200 mg total) by mouth every 8 (eight) hours. 05/28/22  Yes Margarette Canada, NP  Blood Glucose Monitoring Suppl (ACCU-CHEK GUIDE) w/Device KIT USE 1 DEVICE AS DIRECTED THREE TIMES A DAY FOR GLUCOSE MONITORING 09/07/21  Yes [provider]  budesonide-formoterol (SYMBICORT) 160-4.5 MCG/ACT inhaler Inhale 2 puffs into the lungs 2 (two) times daily.   Yes [provider]  Dextromethorphan-guaiFENesin (MUCINEX DM) 30-600 MG TB12 SMARTSIG:1-2 Tablet(s) By Mouth Every 12 Hours PRN 04/28/22  Yes [provider]  guaiFENesin-codeine (ROBITUSSIN AC) 100-10 MG/5ML syrup Take 5 mLs by mouth 3 (three) times daily as needed for cough. 05/28/22  Yes Margarette Canada, NP  ipratropium (ATROVENT) 0.06 % nasal spray Place 2 sprays into both nostrils 4 (four) times daily. 05/28/22  Yes Margarette Canada, NP  ipratropium-albuterol (DUONEB) 0.5-2.5 (3) MG/3ML SOLN Take 3 mLs by nebulization every  4 (four) hours as needed. Patient taking differently: Take 3 mLs by nebulization every 4 (four) hours as needed. Last dose: 730 am. 09/05/21  Yes Hosie Poisson, MD  JARDIANCE 10 MG TABS tablet Take 10 mg by mouth daily. 09/07/21  Yes [provider]  Lancets MISC SMARTSIG:Topical 09/07/21  Yes [provider]  levofloxacin (LEVAQUIN) 500 MG tablet Take 1 tablet (500 mg total) by mouth daily for 5 days. 09/01/22 09/06/22 Yes Yaresly Menzel, DO  metFORMIN  (GLUCOPHAGE-XR) 500 MG 24 hr tablet Take 500 mg by mouth daily with breakfast.   Yes [provider]  methocarbamol (ROBAXIN) 500 MG tablet Take 500 mg by mouth every 6 (six) hours as needed for muscle spasms. 06/21/21  Yes [provider]  MIRALAX 17 g packet Take 17 g by mouth daily as needed for mild constipation. 06/23/21  Yes [provider]  ondansetron (ZOFRAN-ODT) 4 MG disintegrating tablet Take 1 tablet (4 mg total) by mouth every 8 (eight) hours as needed. 09/01/22  Yes Virgel Haro, DO  OZEMPIC, 0.25 OR 0.5 MG/DOSE, 2 MG/3ML SOPN Inject into the skin. 04/26/22  Yes [provider]  pantoprazole (PROTONIX) 40 MG tablet Take 1 tablet (40 mg total) by mouth daily. 06/11/21  Yes Cook, Jayce G, DO  predniSONE (DELTASONE) 20 MG tablet Take 2 tablets (40 mg total) by mouth daily for 5 days. 09/01/22 09/06/22 Yes Lyndee Hensen, DO  Respiratory Therapy Supplies (ADULT MASK LARGE) MISC See admin instructions. use with inhaler 10/17/21  Yes [provider]  Spacer/Aero-Holding Chambers (AEROCHAMBER PLUS) inhaler Use with inhaler 10/15/21  Yes Melynda Ripple, MD  VOLTAREN ARTHRITIS PAIN 1 % GEL Apply 2 g topically 4 (four) times daily. 03/22/22  Yes [provider]  XOPENEX HFA 45 MCG/ACT inhaler Inhale 1 puff into the lungs 3 (three) times daily. 11/02/21  Yes [provider]  tiotropium (SPIRIVA HANDIHALER) 18 MCG inhalation capsule Place 1 capsule (18 mcg total) into inhaler and inhale daily. Patient not taking: Reported on 06/05/2022 09/05/21   Hosie Poisson, MD    Family History Family History  Problem Relation Age of Onset   CAD Mother    CAD Father    Breast cancer Neg Hx     Social History Social History   Tobacco Use   Smoking status: Former    Packs/day: 0.50    Years: 25.00    Total pack years: 12.50    Types: Cigarettes    Quit date: 07/06/2014    Years since quitting: 8.1   Smokeless tobacco: Never  Vaping Use    Vaping Use: Never used  Substance Use Topics   Alcohol use: No    Alcohol/week: 0.0 standard drinks of alcohol   Drug use: No     Allergies   Penicillins   Review of Systems Review of Systems: negative unless otherwise stated in HPI.      Physical Exam Triage Vital Signs ED Triage Vitals  Enc Vitals Group     BP 09/01/22 1354 124/83     Pulse Rate 09/01/22 1354 (!) 141     Resp 09/01/22 1354 16     Temp 09/01/22 1354 99 F (37.2 C)     Temp Source 09/01/22 1354 Oral     SpO2 09/01/22 1354 93 %     Weight 09/01/22 1352 160 lb (72.6 kg)     Height 09/01/22 1352 _0  (1.6 m)     Head Circumference --  Peak Flow --      Pain Score 09/01/22 1352 0     Pain Loc --      Pain Edu? --      Excl. in [place]? --    No data found.  Updated Vital Signs BP 124/83 (BP Location: Right Arm)   Pulse (!) 133   Temp 99 F (37.2 C) (Oral)   Resp 16   Ht _0  (1.6 m)   Wt 72.6 kg   SpO2 98%   BMI 28.34 kg/m   Visual Acuity Right Eye Distance:   Left Eye Distance:   Bilateral Distance:    Right Eye Near:   Left Eye Near:    Bilateral Near:     Physical Exam GEN:     alert, non-toxic appearing female in no distress    HENT:  mucus membranes moist, oropharyngeal without lesions, erythema or exudate, no tonsillar hypertrophy, clear nasal discharge EYES:   no scleral injection or discharge, wears glasses  NECK:  ROM at baseline, no meningismus   RESP:   wheezing diffusely, mild rhonchi, no rales, pursed lip breathing  CVS:   regular rate and rhythm Skin:   warm and dry    UC Treatments / Results  Labs (all labs ordered are listed, but only abnormal results are displayed) Labs Reviewed  GLUCOSE, CAPILLARY - Abnormal; Notable for the following components:      Result Value   Glucose-Capillary 168 (*)    All other components within normal limits  RESP PANEL BY RT-PCR (RSV, FLU A&B, COVID)  RVPGX2  CBG MONITORING, ED    EKG   Radiology No results  found.  Procedures Procedures (including critical care time)  Medications Ordered in UC Medications  ipratropium-albuterol (DUONEB) 0.5-2.5 (3) MG/3ML nebulizer solution 3 mL (3 mLs Nebulization Given 09/01/22 1553)    Initial Impression / Assessment and Plan / UC Course  I have reviewed the triage vital signs and the nursing notes.  Pertinent labs & imaging results that were available during my care of the patient were reviewed by me and considered in my medical decision making (see chart for details).       Pt is a 71 y.o. female with COPD on oxygen who presents for 1 day of respiratory symptoms. Katherine Carter has an elevated temperature here, 99 F. Satting well on room air.  She is tachycardic.  Overall pt is non-toxic appearing, well hydrated, with pursed lip breathing. Pulmonary exam is remarkable.  RSV, COVID and influenza testing obtained and were negative.  Her concentrator has run out.  She was given a DuoNeb with vast improvement.  Pursed lip breathing has resolved.  Treat COPD exacerbation with steroids and antibiotics as below.  Zofran for nausea.  Discussed symptomatic treatment.  Explained lack of efficacy of antibiotics in viral disease.  Typical duration of symptoms discussed.   Return and ED precautions given and voiced understanding. Discussed MDM, treatment plan and plan for follow-up with patient/female family member who agrees with plan.     Final Clinical Impressions(s) / UC Diagnoses   Final diagnoses:  COPD exacerbation [facility])     Discharge Instructions      Your COVID, influenza and RSV tests were negative. I sent some prednisone and antibiotics for a COPD flare up that was likely caused by a virus.  I also sent some anti-nausea medication.   Continue using oxygen as previously advised.       ED Prescriptions     Medication  Sig Dispense Auth. Provider   levofloxacin (LEVAQUIN) 500 MG tablet Take 1 tablet (500 mg total) by mouth daily for 5 days. 5 tablet  Glade Strausser, DO   predniSONE (DELTASONE) 20 MG tablet Take 2 tablets (40 mg total) by mouth daily for 5 days. 10 tablet Hanako Tipping, DO   ondansetron (ZOFRAN-ODT) 4 MG disintegrating tablet Take 1 tablet (4 mg total) by mouth every 8 (eight) hours as needed. 20 tablet Lyndee Hensen, DO      PDMP not reviewed this encounter.   Lyndee Hensen, DO 09/01/22 1806

## 2022-09-01 NOTE — ED Triage Notes (Signed)
Pt has a cough, nausea, and fever. X1 day

## 2022-09-01 NOTE — Discharge Instructions (Addendum)
Your COVID, influenza and RSV tests were negative. I sent some prednisone and antibiotics for a COPD flare up that was likely caused by a virus.  I also sent some anti-nausea medication.   Continue using oxygen as previously advised.

## 2022-09-21 ENCOUNTER — Emergency Department: Payer: Medicare Other

## 2022-09-21 ENCOUNTER — Other Ambulatory Visit: Payer: Self-pay

## 2022-09-21 ENCOUNTER — Inpatient Hospital Stay
Admission: EM | Admit: 2022-09-21 | Discharge: 2022-10-05 | DRG: 871 | Disposition: A | Discharge status: Home Health | Payer: Medicare Other | Attending: Obstetrics and Gynecology | Admitting: Obstetrics and Gynecology

## 2022-09-21 ENCOUNTER — Inpatient Hospital Stay: Payer: Medicare Other

## 2022-09-21 DIAGNOSIS — E875 Hyperkalemia: Secondary | ICD-10-CM | POA: Diagnosis present

## 2022-09-21 DIAGNOSIS — K567 Ileus, unspecified: Secondary | ICD-10-CM | POA: Diagnosis not present

## 2022-09-21 DIAGNOSIS — Z7982 Long term (current) use of aspirin: Secondary | ICD-10-CM

## 2022-09-21 DIAGNOSIS — J440 Chronic obstructive pulmonary disease with acute lower respiratory infection: Secondary | ICD-10-CM | POA: Diagnosis present

## 2022-09-21 DIAGNOSIS — I10 Essential (primary) hypertension: Secondary | ICD-10-CM | POA: Diagnosis present

## 2022-09-21 DIAGNOSIS — E871 Hypo-osmolality and hyponatremia: Secondary | ICD-10-CM | POA: Diagnosis present

## 2022-09-21 DIAGNOSIS — Z66 Do not resuscitate: Secondary | ICD-10-CM | POA: Diagnosis present

## 2022-09-21 DIAGNOSIS — Z8616 Personal history of COVID-19: Secondary | ICD-10-CM

## 2022-09-21 DIAGNOSIS — E1165 Type 2 diabetes mellitus with hyperglycemia: Secondary | ICD-10-CM | POA: Diagnosis present

## 2022-09-21 DIAGNOSIS — A419 Sepsis, unspecified organism: Principal | ICD-10-CM | POA: Diagnosis present

## 2022-09-21 DIAGNOSIS — N179 Acute kidney failure, unspecified: Secondary | ICD-10-CM | POA: Diagnosis present

## 2022-09-21 DIAGNOSIS — R54 Age-related physical debility: Secondary | ICD-10-CM | POA: Diagnosis present

## 2022-09-21 DIAGNOSIS — J9621 Acute and chronic respiratory failure with hypoxia: Secondary | ICD-10-CM | POA: Diagnosis present

## 2022-09-21 DIAGNOSIS — Z87891 Personal history of nicotine dependence: Secondary | ICD-10-CM | POA: Diagnosis not present

## 2022-09-21 DIAGNOSIS — Z7984 Long term (current) use of oral hypoglycemic drugs: Secondary | ICD-10-CM

## 2022-09-21 DIAGNOSIS — Z7951 Long term (current) use of inhaled steroids: Secondary | ICD-10-CM

## 2022-09-21 DIAGNOSIS — E43 Unspecified severe protein-calorie malnutrition: Secondary | ICD-10-CM | POA: Diagnosis present

## 2022-09-21 DIAGNOSIS — Z515 Encounter for palliative care: Secondary | ICD-10-CM | POA: Diagnosis not present

## 2022-09-21 DIAGNOSIS — J441 Chronic obstructive pulmonary disease with (acute) exacerbation: Secondary | ICD-10-CM | POA: Diagnosis present

## 2022-09-21 DIAGNOSIS — R6520 Severe sepsis without septic shock: Secondary | ICD-10-CM | POA: Diagnosis present

## 2022-09-21 DIAGNOSIS — R159 Full incontinence of feces: Secondary | ICD-10-CM | POA: Diagnosis present

## 2022-09-21 DIAGNOSIS — E872 Acidosis, unspecified: Secondary | ICD-10-CM

## 2022-09-21 DIAGNOSIS — K219 Gastro-esophageal reflux disease without esophagitis: Secondary | ICD-10-CM | POA: Diagnosis present

## 2022-09-21 DIAGNOSIS — Z9981 Dependence on supplemental oxygen: Secondary | ICD-10-CM

## 2022-09-21 DIAGNOSIS — J189 Pneumonia, unspecified organism: Secondary | ICD-10-CM

## 2022-09-21 DIAGNOSIS — I723 Aneurysm of iliac artery: Secondary | ICD-10-CM | POA: Diagnosis present

## 2022-09-21 DIAGNOSIS — K56600 Partial intestinal obstruction, unspecified as to cause: Secondary | ICD-10-CM | POA: Diagnosis present

## 2022-09-21 DIAGNOSIS — Z1152 Encounter for screening for COVID-19: Secondary | ICD-10-CM

## 2022-09-21 DIAGNOSIS — E119 Type 2 diabetes mellitus without complications: Secondary | ICD-10-CM | POA: Diagnosis not present

## 2022-09-21 DIAGNOSIS — Z8249 Family history of ischemic heart disease and other diseases of the circulatory system: Secondary | ICD-10-CM

## 2022-09-21 DIAGNOSIS — Z794 Long term (current) use of insulin: Secondary | ICD-10-CM

## 2022-09-21 DIAGNOSIS — E8729 Other acidosis: Secondary | ICD-10-CM | POA: Diagnosis present

## 2022-09-21 DIAGNOSIS — K429 Umbilical hernia without obstruction or gangrene: Secondary | ICD-10-CM | POA: Diagnosis present

## 2022-09-21 DIAGNOSIS — Z90710 Acquired absence of both cervix and uterus: Secondary | ICD-10-CM

## 2022-09-21 DIAGNOSIS — I700 Atherosclerosis of aorta: Secondary | ICD-10-CM | POA: Diagnosis present

## 2022-09-21 DIAGNOSIS — R739 Hyperglycemia, unspecified: Secondary | ICD-10-CM

## 2022-09-21 DIAGNOSIS — I745 Embolism and thrombosis of iliac artery: Secondary | ICD-10-CM | POA: Diagnosis not present

## 2022-09-21 DIAGNOSIS — Z6825 Body mass index (BMI) 25.0-25.9, adult: Secondary | ICD-10-CM

## 2022-09-21 DIAGNOSIS — Z79899 Other long term (current) drug therapy: Secondary | ICD-10-CM

## 2022-09-21 LAB — BASIC METABOLIC PANEL
Anion gap: 14 (ref 5–15)
Anion gap: 9 (ref 5–15)
BUN: 41 mg/dL — ABNORMAL HIGH (ref 8–23)
BUN: 34 mg/dL — ABNORMAL HIGH (ref 8–23)
CO2: 19 mmol/L — ABNORMAL LOW (ref 22–32)
CO2: 18 mmol/L — ABNORMAL LOW (ref 22–32)
Calcium: 9.3 mg/dL (ref 8.9–10.3)
Calcium: 8.3 mg/dL — ABNORMAL LOW (ref 8.9–10.3)
Chloride: 94 mmol/L — ABNORMAL LOW (ref 98–111)
Chloride: 106 mmol/L (ref 98–111)
Creatinine, Ser: 1.88 mg/dL — ABNORMAL HIGH (ref 0.44–1.00)
Creatinine, Ser: 1.54 mg/dL — ABNORMAL HIGH (ref 0.44–1.00)
GFR, Estimated: 28 mL/min — ABNORMAL LOW (ref >60)
GFR, Estimated: 36 mL/min — ABNORMAL LOW (ref >60)
Glucose, Bld: 574 mg/dL (ref 70–99)
Glucose, Bld: 403 mg/dL — ABNORMAL HIGH (ref 70–99)
Potassium: 5.4 mmol/L — ABNORMAL HIGH (ref 3.5–5.1)
Potassium: 4.7 mmol/L (ref 3.5–5.1)
Sodium: 127 mmol/L — ABNORMAL LOW (ref 135–145)
Sodium: 133 mmol/L — ABNORMAL LOW (ref 135–145)

## 2022-09-21 LAB — CBC WITH DIFFERENTIAL/PLATELET
Abs Immature Granulocytes: 0.14 10*3/uL — ABNORMAL HIGH (ref 0.00–0.07)
Basophils Absolute: 0.1 10*3/uL (ref 0.0–0.1)
Basophils Relative: 0 %
Eosinophils Absolute: 0 10*3/uL (ref 0.0–0.5)
Eosinophils Relative: 0 %
HCT: 41.7 % (ref 36.0–46.0)
Hemoglobin: 13.2 g/dL (ref 12.0–15.0)
Immature Granulocytes: 1 %
Lymphocytes Relative: 6 %
Lymphs Abs: 0.9 10*3/uL (ref 0.7–4.0)
MCH: 28.8 pg (ref 26.0–34.0)
MCHC: 31.7 g/dL (ref 30.0–36.0)
MCV: 91 fL (ref 80.0–100.0)
Monocytes Absolute: 0.5 10*3/uL (ref 0.1–1.0)
Monocytes Relative: 3 %
Neutro Abs: 14.7 10*3/uL — ABNORMAL HIGH (ref 1.7–7.7)
Neutrophils Relative %: 90 %
Platelets: 255 10*3/uL (ref 150–400)
RBC: 4.58 MIL/uL (ref 3.87–5.11)
RDW: 15.2 % (ref 11.5–15.5)
WBC: 16.3 10*3/uL — ABNORMAL HIGH (ref 4.0–10.5)
nRBC: 0 % (ref 0.0–0.2)

## 2022-09-21 LAB — RESPIRATORY PANEL BY PCR

## 2022-09-21 LAB — COMPREHENSIVE METABOLIC PANEL
ALT: 55 U/L — ABNORMAL HIGH (ref 0–44)
AST: 53 U/L — ABNORMAL HIGH (ref 15–41)
Albumin: 2.7 g/dL — ABNORMAL LOW (ref 3.5–5.0)
Alkaline Phosphatase: 120 U/L (ref 38–126)
Anion gap: 14 (ref 5–15)
BUN: 42 mg/dL — ABNORMAL HIGH (ref 8–23)
CO2: 20 mmol/L — ABNORMAL LOW (ref 22–32)
Calcium: 9.5 mg/dL (ref 8.9–10.3)
Chloride: 93 mmol/L — ABNORMAL LOW (ref 98–111)
Creatinine, Ser: 1.89 mg/dL — ABNORMAL HIGH (ref 0.44–1.00)
GFR, Estimated: 28 mL/min — ABNORMAL LOW (ref >60)
Glucose, Bld: 589 mg/dL (ref 70–99)
Potassium: 5.3 mmol/L — ABNORMAL HIGH (ref 3.5–5.1)
Sodium: 127 mmol/L — ABNORMAL LOW (ref 135–145)
Total Bilirubin: 1.4 mg/dL — ABNORMAL HIGH (ref 0.3–1.2)
Total Protein: 9.1 g/dL — ABNORMAL HIGH (ref 6.5–8.1)

## 2022-09-21 LAB — GLUCOSE, CAPILLARY
Glucose-Capillary: 367 mg/dL — ABNORMAL HIGH (ref 70–99)
Glucose-Capillary: 394 mg/dL — ABNORMAL HIGH (ref 70–99)
Glucose-Capillary: 325 mg/dL — ABNORMAL HIGH (ref 70–99)
Glucose-Capillary: 272 mg/dL — ABNORMAL HIGH (ref 70–99)
Glucose-Capillary: 210 mg/dL — ABNORMAL HIGH (ref 70–99)

## 2022-09-21 LAB — RESP PANEL BY RT-PCR (RSV, FLU A&B, COVID)  RVPGX2
Influenza A by PCR: NEGATIVE
Influenza B by PCR: NEGATIVE
Resp Syncytial Virus by PCR: NEGATIVE
SARS Coronavirus 2 by RT PCR: NEGATIVE

## 2022-09-21 LAB — URINALYSIS, COMPLETE (UACMP) WITH MICROSCOPIC
Bilirubin Urine: NEGATIVE
Glucose, UA: >=500 mg/dL — AB
Ketones, ur: 5 mg/dL — AB
Nitrite: NEGATIVE
Protein, ur: 30 mg/dL — AB
Specific Gravity, Urine: 1.023 (ref 1.005–1.030)
pH: 5 (ref 5.0–8.0)

## 2022-09-21 LAB — BETA-HYDROXYBUTYRIC ACID: Beta-Hydroxybutyric Acid: 1.36 mmol/L — ABNORMAL HIGH (ref 0.05–0.27)

## 2022-09-21 LAB — APTT: aPTT: 25 seconds (ref 24–36)

## 2022-09-21 LAB — CK: Total CK: 68 U/L (ref 38–234)

## 2022-09-21 LAB — CBG MONITORING, ED
Glucose-Capillary: 474 mg/dL — ABNORMAL HIGH (ref 70–99)
Glucose-Capillary: 383 mg/dL — ABNORMAL HIGH (ref 70–99)

## 2022-09-21 LAB — BRAIN NATRIURETIC PEPTIDE: B Natriuretic Peptide: 110.7 pg/mL — ABNORMAL HIGH (ref 0.0–100.0)

## 2022-09-21 LAB — LACTIC ACID, PLASMA
Lactic Acid, Venous: 2.7 mmol/L (ref 0.5–1.9)
Lactic Acid, Venous: 3.9 mmol/L (ref 0.5–1.9)

## 2022-09-21 LAB — PROCALCITONIN: Procalcitonin: 13.55 ng/mL

## 2022-09-21 LAB — TROPONIN I (HIGH SENSITIVITY)
Troponin I (High Sensitivity): 31 ng/L — ABNORMAL HIGH (ref <18)
Troponin I (High Sensitivity): 36 ng/L — ABNORMAL HIGH (ref <18)

## 2022-09-21 LAB — MAGNESIUM: Magnesium: 2.5 mg/dL — ABNORMAL HIGH (ref 1.7–2.4)

## 2022-09-21 LAB — PROTIME-INR
INR: 1.3 — ABNORMAL HIGH (ref 0.8–1.2)
Prothrombin Time: 15.6 seconds — ABNORMAL HIGH (ref 11.4–15.2)

## 2022-09-21 LAB — STREP PNEUMONIAE URINARY ANTIGEN: Strep Pneumo Urinary Antigen: NEGATIVE

## 2022-09-21 LAB — MRSA NEXT GEN BY PCR, NASAL: MRSA by PCR Next Gen: NOT DETECTED

## 2022-09-21 LAB — BLOOD GAS, VENOUS

## 2022-09-21 MED ORDER — INSULIN ASPART 100 UNIT/ML IJ SOLN
10.0000 [IU] | Freq: Once | INTRAMUSCULAR | Status: AC
  Administered 2022-09-21: 10 [IU] via SUBCUTANEOUS
  Filled 2022-09-21: qty 1

## 2022-09-21 MED ORDER — IPRATROPIUM-ALBUTEROL 0.5-2.5 (3) MG/3ML IN SOLN
3.0000 mL | RESPIRATORY_TRACT | Status: DC
  Administered 2022-09-21 – 2022-09-23 (×11): 3 mL via RESPIRATORY_TRACT
  Filled 2022-09-21 (×11): qty 3

## 2022-09-21 MED ORDER — CHLORHEXIDINE GLUCONATE CLOTH 2 % EX PADS
6.0000 | MEDICATED_PAD | Freq: Every day | CUTANEOUS | Status: DC
  Administered 2022-09-22 – 2022-10-05 (×12): 6 via TOPICAL

## 2022-09-21 MED ORDER — VANCOMYCIN HCL 1500 MG/300ML IV SOLN
1500.0000 mg | Freq: Once | INTRAVENOUS | Status: AC
  Administered 2022-09-21: 1500 mg via INTRAVENOUS
  Filled 2022-09-21: qty 300

## 2022-09-21 MED ORDER — POLYETHYLENE GLYCOL 3350 17 G PO PACK
17.0000 g | PACK | Freq: Every day | ORAL | Status: DC | PRN
  Administered 2022-09-30: 17 g via ORAL
  Filled 2022-09-21: qty 1

## 2022-09-21 MED ORDER — MIDAZOLAM HCL 2 MG/2ML IJ SOLN
1.0000 mg | Freq: Once | INTRAMUSCULAR | Status: AC
  Administered 2022-09-21: 1 mg via INTRAVENOUS
  Filled 2022-09-21: qty 2

## 2022-09-21 MED ORDER — SODIUM CHLORIDE 0.9 % IV BOLUS
1000.0000 mL | Freq: Once | INTRAVENOUS | Status: AC
  Administered 2022-09-21: 1000 mL via INTRAVENOUS

## 2022-09-21 MED ORDER — IOHEXOL 350 MG/ML SOLN: 60.0000 mL | Freq: Once | INTRAVENOUS | Status: DC | PRN

## 2022-09-21 MED ORDER — METHYLPREDNISOLONE SODIUM SUCC 125 MG IJ SOLR
60.0000 mg | Freq: Once | INTRAMUSCULAR | Status: AC
  Administered 2022-09-21: 60 mg via INTRAVENOUS
  Filled 2022-09-21: qty 2

## 2022-09-21 MED ORDER — HEPARIN SODIUM (PORCINE) 5000 UNIT/ML IJ SOLN
5000.0000 [IU] | Freq: Three times a day (TID) | INTRAMUSCULAR | Status: DC
  Administered 2022-09-21 – 2022-09-23 (×5): 5000 [IU] via SUBCUTANEOUS
  Filled 2022-09-21 (×5): qty 1

## 2022-09-21 MED ORDER — METHYLPREDNISOLONE SODIUM SUCC 40 MG IJ SOLR
40.0000 mg | Freq: Every day | INTRAMUSCULAR | Status: DC
  Administered 2022-09-22 – 2022-09-25 (×4): 40 mg via INTRAVENOUS
  Filled 2022-09-21 (×5): qty 1

## 2022-09-21 MED ORDER — IPRATROPIUM-ALBUTEROL 0.5-2.5 (3) MG/3ML IN SOLN
3.0000 mL | Freq: Once | RESPIRATORY_TRACT | Status: AC
  Administered 2022-09-21: 3 mL via RESPIRATORY_TRACT
  Filled 2022-09-21: qty 3

## 2022-09-21 MED ORDER — INSULIN REGULAR(HUMAN) IN NACL 100-0.9 UT/100ML-% IV SOLN
INTRAVENOUS | Status: DC
  Administered 2022-09-21: 13 [IU]/h via INTRAVENOUS
  Filled 2022-09-21: qty 100

## 2022-09-21 MED ORDER — LACTATED RINGERS IV BOLUS
1000.0000 mL | Freq: Once | INTRAVENOUS | Status: AC
  Administered 2022-09-21: 1000 mL via INTRAVENOUS

## 2022-09-21 MED ORDER — ORAL CARE MOUTH RINSE
15.0000 mL | OROMUCOSAL | Status: DC
  Administered 2022-09-21 – 2022-09-27 (×21): 15 mL via OROMUCOSAL

## 2022-09-21 MED ORDER — ORAL CARE MOUTH RINSE: 15.0000 mL | OROMUCOSAL | Status: DC | PRN

## 2022-09-21 MED ORDER — BUDESONIDE 0.5 MG/2ML IN SUSP
0.5000 mg | Freq: Two times a day (BID) | RESPIRATORY_TRACT | Status: DC
  Administered 2022-09-21 – 2022-09-30 (×18): 0.5 mg via RESPIRATORY_TRACT
  Filled 2022-09-21 (×18): qty 2

## 2022-09-21 MED ORDER — SODIUM CHLORIDE 0.9 % IV SOLN
500.0000 mg | INTRAVENOUS | Status: AC
  Administered 2022-09-22 – 2022-09-25 (×4): 500 mg via INTRAVENOUS
  Filled 2022-09-21: qty 5
  Filled 2022-09-21: qty 500
  Filled 2022-09-21 (×2): qty 5
  Filled 2022-09-21: qty 500

## 2022-09-21 MED ORDER — LACTATED RINGERS IV SOLN: INTRAVENOUS | Status: AC

## 2022-09-21 MED ORDER — DEXTROSE IN LACTATED RINGERS 5 % IV SOLN: INTRAVENOUS | Status: DC

## 2022-09-21 MED ORDER — ONDANSETRON HCL 4 MG/2ML IJ SOLN
4.0000 mg | Freq: Four times a day (QID) | INTRAMUSCULAR | Status: DC | PRN
  Administered 2022-09-23 – 2022-10-05 (×12): 4 mg via INTRAVENOUS
  Filled 2022-09-21 (×12): qty 2

## 2022-09-21 MED ORDER — ACETAMINOPHEN 325 MG PO TABS
650.0000 mg | ORAL_TABLET | ORAL | Status: DC | PRN
  Administered 2022-09-29 – 2022-10-03 (×4): 650 mg via ORAL
  Filled 2022-09-21 (×4): qty 2

## 2022-09-21 MED ORDER — POTASSIUM CHLORIDE 10 MEQ/100ML IV SOLN
10.0000 meq | INTRAVENOUS | Status: AC
  Administered 2022-09-21 (×2): 10 meq via INTRAVENOUS
  Filled 2022-09-21 (×2): qty 100

## 2022-09-21 MED ORDER — INSULIN ASPART 100 UNIT/ML IJ SOLN: 0.0000 [IU] | INTRAMUSCULAR | Status: DC

## 2022-09-21 MED ORDER — DOCUSATE SODIUM 100 MG PO CAPS
100.0000 mg | ORAL_CAPSULE | Freq: Two times a day (BID) | ORAL | Status: DC | PRN
  Administered 2022-09-30: 100 mg via ORAL
  Filled 2022-09-21: qty 1

## 2022-09-21 MED ORDER — MORPHINE SULFATE (PF) 2 MG/ML IV SOLN
1.0000 mg | Freq: Once | INTRAVENOUS | Status: AC
  Administered 2022-09-21: 1 mg via INTRAVENOUS
  Filled 2022-09-21: qty 1

## 2022-09-21 MED ORDER — IOHEXOL 350 MG/ML SOLN
60.0000 mL | Freq: Once | INTRAVENOUS | Status: AC | PRN
  Administered 2022-09-21: 60 mL via INTRAVENOUS

## 2022-09-21 MED ORDER — SODIUM CHLORIDE 0.9 % IV SOLN
2.0000 g | INTRAVENOUS | Status: DC
  Filled 2022-09-21: qty 12.5

## 2022-09-21 MED ORDER — VANCOMYCIN VARIABLE DOSE PER UNSTABLE RENAL FUNCTION (PHARMACIST DOSING): Status: DC

## 2022-09-21 MED ORDER — SODIUM CHLORIDE 0.9 % IV SOLN
500.0000 mg | Freq: Once | INTRAVENOUS | Status: AC
  Administered 2022-09-21: 500 mg via INTRAVENOUS
  Filled 2022-09-21: qty 5

## 2022-09-21 MED ORDER — MIDAZOLAM HCL 2 MG/2ML IJ SOLN
1.0000 mg | Freq: Once | INTRAMUSCULAR | Status: AC
  Administered 2022-09-21: 1 mg via INTRAVENOUS

## 2022-09-21 MED ORDER — DEXTROSE 50 % IV SOLN: 0.0000 mL | INTRAVENOUS | Status: DC | PRN

## 2022-09-21 MED ORDER — SODIUM CHLORIDE 0.9 % IV SOLN
2.0000 g | Freq: Once | INTRAVENOUS | Status: AC
  Administered 2022-09-21: 2 g via INTRAVENOUS
  Filled 2022-09-21: qty 12.5

## 2022-09-21 NOTE — ED Notes (Signed)
1330 Several failed IV attempts delayed of ABX, MD made aware [place] IV placed by MD [facility]. Labs obtained and sent, fluids and meds given per [facility].

## 2022-09-21 NOTE — Progress Notes (Signed)
Left upper extremity elevated on pillow and ice pack applied. No swelling noted to site.

## 2022-09-21 NOTE — H&P (Signed)
NAME:  Lumen Mcclard, MRN:  [id number], DOB:  March 10, 1951, LOS: 0 ADMISSION DATE:  09/21/2022, CONSULTATION DATE:  09/21/2021 REFERRING MD:  Dr. Starleen Blue, CHIEF COMPLAINT:  Weakness s/p falls, shortness of breath, cough   Brief Pt Description / Synopsis:  72 y.o. female, DNR/DNI, with PMHx significant for COPD on 4L supplemental O2 admitted with Acute on Chronic Hypoxic Respiratory Failure and severe sepsis in the setting of suspected Multifocal Pneumonia and AECOPD requiring BiPAP.  Also found to have AKI and concern for mild DKA.  History of Present Illness:  Jenalee Budhram is a 72 year old female with a past medical history significant for chronic hypoxic respiratory failure requiring 4 L supplemental oxygen due to COPD, diabetes mellitus, hypertension, GERD who presents to [facility] ED on 09/21/2022 due to generalized weakness status post multiple falls, dyspnea, and cough.  Patient is currently on BiPAP with increased work of breathing, therefore history is obtained from the patient's husband at bedside along with chart review.  Her husband reports that last Saturday she began to have cough and shortness of breath, which have worsened since then.  She is also had generalized weakness status post 2 falls, poor p.o. intake, and dysuria.  He denies any knowledge that she has had any chest pain, dizziness, abdominal pain, nausea, vomiting, diarrhea, fever, chills.  ED Course: Initial Vital Signs: Temperature 98 Fahrenheit orally, pulse 133, respiratory rate 22, blood pressure 109/75, SpO2 92% Significant Labs: Sodium 127, potassium 5.3, chloride 93, bicarb 20, anion gap 14, albumin 2.7, glucose 10/10/1987, BUN 42, creatinine 1.89, AST 53, ALT 55, total bili 1.4, BNP 110, high-sensitivity troponin 31, WBC 16.3 with neutrophilia, lactic acid 2.7, INR 1.3, PT 15.6 VBG on BiPAP: pH 7.17/pCO2 58/pO2/bicarb 21.2 EKG EKG shows sinus tach, RAD, somewhat peaked T waves Imaging Chest  X-ray>>IMPRESSION: 1. New patchy right greater than left perihilar opacities and pulmonary venous congestion. Differential considerations include asymmetric edema versus infection or aspiration. 2.  Aortic Atherosclerosis (ICD10-I70.0). CT head>>IMPRESSION: 1. No acute intracranial abnormality. 2. Chronic small vessel ischemic disease. CT cervical spine>>IMPRESSION: 1. Mildly motion degraded exam. 2. No evidence of acute fracture to the cervical spine. 3. 2-3 mm grade 1 anterolisthesis at C4-C5. 4. Cervical spondylosis, as described. Multilevel spinal canal stenosis. Most notably, a central disc protrusion contributes to at least moderate spinal canal stenosis at C3-C4. Multilevel bony neural foraminal narrowing also present within the cervical spine. A cervical spine MRI may be obtained for further evaluation, as clinically warranted. 5.  Aortic Atherosclerosis (ICD10-I70.0). Medications Administered: 3 L IV fluid boluses, IV's azithromycin, cefepime, vancomycin, 60 mg IV Solu-Medrol, multiple DuoNebs  On arrival to the ED she was tachycardic in the 150s and tachypneic, but initially saturating okay on her baseline 4 L.  She was noted to be pursed lip breathing with expiratory wheezing. Given her work of breathing, patient was placed on BiPAP.  Patient is very adamant that she would not want to be intubated or have CPR or ACLS performed.  PCCM asked to admit for further workup and treatment.  Please see "significant hospital events" section below for full detailed hospital course.  Pertinent  Medical History   Past Medical History:  Diagnosis Date   COPD (chronic obstructive pulmonary disease) ([place])    Diabetes mellitus without complication (HCC)    GERD (gastroesophageal reflux disease)    Hypertension     Micro Data:  1/18: SARS-CoV-2/RSV/influenza PCR>> negative 1/18: Blood culture x 2>> 1/18: Urine>> 1/18: Respiratory viral panel>> 1/18: Strep pneumo  and Legionella  urinary antigens>> 1/18: Mycoplasma>>  Antimicrobials:  Azithromycin 1/18>> Cefepime 1/18>> Vancomycin 1/18>>  Significant Hospital Events: Including procedures, antibiotic start and stop dates in addition to other pertinent events   1/18: Presents to [facility] ED, with severe respiratory distress requiring BiPAP.  Patient adamant she would want to be DNR/DNI.  PCCM asked to admit  Interim History / Subjective:  -Patient seen in the on BiPAP with moderate respiratory distress -Patient remains awake and alert and oriented, able to make decisions for herself ~very adamant that she would be a DNR/DNI ~husband is at bedside to witness patient statements -She does report breathing has slightly improved -Upon auscultation noted to be severely diminished throughout   Objective   Blood pressure 139/71, pulse (!) 128, temperature 98 F (36.7 C), resp. rate (!) 34, height 5\' 3"  (1.6 m), weight 69.4 kg, SpO2 100 %.    FiO2 (%):  [40 %] 40 %  No intake or output data in the 24 hours ending 09/21/22 1704 Filed Weights   09/21/22 1703  Weight: 69.4 kg    Examination: General: Acute on chronically ill-appearing female, laying in bed, on BiPAP, with moderate respiratory distress HENT: Atraumatic, normocephalic, neck supple, no JVD, dry mucous membranes Lungs: Diminished breath sounds throughout, BiPAP assisted, tachypneic with increased work of breathing, even Cardiovascular: Tachycardia, regular rhythm, S1-S2, no murmurs, rubs, gallops Abdomen: Soft, nontender, nondistended, no guarding or rebound tenderness, bowel sounds positive x 4 Extremities: Normal bulk and tone, no deformities, no edema, no cyanosis Neuro: Awake and alert, oriented x 3, moves all extremities to commands, no focal deficits GU: External catheter in place  Resolved Hospital Problem list     Assessment & Plan:   #Acute on Chronic Hypoxic Respiratory Failure in the setting of AECOPD & suspected Multifocal Pneumonia PMHx:  COPD on 4L supplemental O2 at baseline -Supplemental O2 as needed to maintain O2 sats 88 to 92% -BiPAP, wean as tolerated (pt is DNR/DNI) -Follow intermittent Chest X-ray & ABG as needed -Bronchodilators & Pulmicort nebs -IV Steroids -ABX as above -Diuresis as BP and renal function permits -Pulmonary toilet as able -Low dose prn Morphine air hunger -CTA Chest is pending  #Severe Sepsis due to suspected Multifocal Pneumonia & UTI (meets SIRS Criteria at admission: HR >90, RR >20, WBC 16.3) -Monitor fever curve -Trend WBC's & Procalcitonin -Follow cultures as above -Continue empiric Azithromycin, Cefepime, and Vancomycin pending cultures & sensitivities  #Acute Kidney Injury #Hypertonic Hyponatremia in setting of severe hyperglycemia (corrected Na for glucose is Na 135)  #Mild Hyperkalemia #Anion Gap Metabolic Acidosis -Monitor I&O's / urinary output -Follow BMP -Ensure adequate renal perfusion -Avoid nephrotoxic agents as able -Replace electrolytes as indicated ~ Pharmacy consulted for assistance with replacement -IV fluids -CK normal at 68  #Diabetes Mellitus Type II #Concern for Mild DKA -CBG's q4h; Target range of 140 to 180 -SSI -Follow ICU Hypo/Hyperglycemia protocol -Check serum Hydroxybutyric acid and follow up BMP ~ may need insulin gtt -Check Hgb A1c  #Mildly Elevated LFT's -Trend LFT's -Consider RUQ [place]    Patient is critically ill superimposed on severe COPD.  Prognosis is currently extremely guarded.  High risk for cardiac arrest and death.  Best Practice (right click and "Reselect all SmartList Selections" daily)   Diet/type: NPO DVT prophylaxis: prophylactic heparin  GI prophylaxis: PPI Lines: N/A Foley:  N/A Code Status:  DNR Last date of multidisciplinary goals of care discussion [N/A]  1/18: Pt and her husband updated at bedside.  Pt is  currently awake and alert, oriented and able to make decisions for herself.  She is very adamant that she would  NOT want to be intubated, and DOES NOT WANT CPR or ACLS.  Husband doesn't necessarily agree, however he wishes to abide by her wishes.  Code status changed to DNR/DNI.  Labs   CBC: Recent Labs  Lab 09/21/22 1341  WBC 16.3*  NEUTROABS 14.7*  HGB 13.2  HCT 41.7  MCV 91.0  PLT 878    Basic Metabolic Panel: Recent Labs  Lab 09/21/22 1341  NA 127*  K 5.3*  CL 93*  CO2 20*  GLUCOSE 589*  BUN 42*  CREATININE 1.89*  CALCIUM 9.5   GFR: Estimated Creatinine Clearance: 25.5 mL/min (A) (by C-G formula based on SCr of 1.89 mg/dL (H)). Recent Labs  Lab 09/21/22 1341  WBC 16.3*  LATICACIDVEN 2.7*    Liver Function Tests: Recent Labs  Lab 09/21/22 1341  AST 53*  ALT 55*  ALKPHOS 120  BILITOT 1.4*  PROT 9.1*  ALBUMIN 2.7*   No results for input(s): "LIPASE", "AMYLASE" in the last 168 hours. No results for input(s): "AMMONIA" in the last 168 hours.  ABG    Component Value Date/Time   PHART 7.33 (L) 09/02/2021 2117   PCO2ART 51 (H) 09/02/2021 2117   PO2ART 249 (H) 09/02/2021 2117   HCO3 21.2 09/21/2022 1446   ACIDBASEDEF 8.0 (H) 09/21/2022 1446   O2SAT PENDING 09/21/2022 1446     Coagulation Profile: Recent Labs  Lab 09/21/22 1341  INR 1.3*    Cardiac Enzymes: No results for input(s): "CKTOTAL", "CKMB", "CKMBINDEX", "TROPONINI" in the last 168 hours.  HbA1C: Hgb A1c MFr Bld  Date/Time Value Ref Range Status  06/04/2022 03:37 PM 8.5 (H) 4.8 - 5.6 % Final    Comment:    (NOTE) Pre diabetes:          5.7%-6.4%  Diabetes:              >6.4%  Glycemic control for   <7.0% adults with diabetes   09/03/2021 10:47 AM 9.3 (H) 4.8 - 5.6 % Final    Comment:    (NOTE) Pre diabetes:          5.7%-6.4%  Diabetes:              >6.4%  Glycemic control for   <7.0% adults with diabetes     CBG: Recent Labs  Lab 09/21/22 1500 09/21/22 1657  GLUCAP 474* 383*    Review of Systems:   Positives in BOLD: Gen: Denies fever, chills, weight change, fatigue,  night sweats HEENT: Denies blurred vision, double vision, hearing loss, tinnitus, sinus congestion, rhinorrhea, sore throat, neck stiffness, dysphagia PULM: Denies shortness of breath, cough, sputum production, hemoptysis, wheezing CV: Denies chest pain, edema, orthopnea, paroxysmal nocturnal dyspnea, palpitations GI: Denies abdominal pain, nausea, vomiting, diarrhea, hematochezia, melena, constipation, change in bowel habits GU: Denies dysuria, hematuria, polyuria, oliguria, urethral discharge Endocrine: Denies hot or cold intolerance, polyuria, polyphagia or appetite change Derm: Denies rash, dry skin, scaling or peeling skin change Heme: Denies easy bruising, bleeding, bleeding gums Neuro: Denies headache, numbness, weakness, slurred speech, loss of memory or consciousness   Past Medical History:  She,  has a past medical history of COPD (chronic obstructive pulmonary disease) ([place]), Diabetes mellitus without complication ([place]), GERD (gastroesophageal reflux disease), and Hypertension.   Surgical History:   Past Surgical History:  Procedure Laterality Date   ABDOMINAL HYSTERECTOMY     COLONOSCOPY WITH  PROPOFOL N/A 01/23/2017   Procedure: COLONOSCOPY WITH PROPOFOL;  Surgeon: Lollie Sails, MD;  Location: [facility] ENDOSCOPY;  Service: Endoscopy;  Laterality: N/A;   FOOT SURGERY       Social History:   reports that she quit smoking about 8 years ago. Her smoking use included cigarettes. She has a 12.50 pack-year smoking history. She has never used smokeless tobacco. She reports that she does not drink alcohol and does not use drugs.   Family History:  Her family history includes CAD in her father and mother. There is no history of Breast cancer.   Allergies Allergies  Allergen Reactions   Penicillins Hives and Other (See Comments)    Has patient had a PCN reaction causing immediate rash, facial/tongue/throat swelling, SOB or lightheadedness with hypotension: No Has patient had a  PCN reaction causing severe rash involving mucus membranes or skin necrosis: No Has patient had a PCN reaction that required hospitalization: No Has patient had a PCN reaction occurring within the last 10 years: No If all of the above answers are "NO", then may proceed with Cephalosporin use.     Home Medications  Prior to Admission medications   Medication Sig Start Date End Date Taking? Authorizing Provider  celecoxib (CELEBREX) 100 MG capsule Take 100 mg by mouth daily. 08/10/22  Yes [provider]  fluticasone (FLONASE) 50 MCG/ACT nasal spray Place 1 spray into both nostrils daily. 09/06/22  Yes [provider]  latanoprost (XALATAN) 0.005 % ophthalmic solution Place 1 drop into both eyes 2 (two) times daily. 08/04/22  Yes [provider]  losartan (COZAAR) 100 MG tablet Take 100 mg by mouth daily. 08/24/22  Yes [provider]  ondansetron (ZOFRAN) 4 MG tablet Take 4 mg by mouth every 8 (eight) hours as needed for nausea or vomiting. 07/04/22  Yes [provider]  TRULICITY 3.01 SW/1.0XN SOPN Inject 0.75 mg into the skin once a week. 06/28/22  Yes [provider]  albuterol (VENTOLIN HFA) 108 (90 Base) MCG/ACT inhaler Inhale 2 puffs into the lungs every 4 (four) hours as needed for wheezing or shortness of breath.    [provider]  amLODipine (NORVASC) 5 MG tablet Take 1 tablet (5 mg total) by mouth daily. 09/05/21   Hosie Poisson, MD  aspirin EC 81 MG tablet Take 81 mg by mouth daily. 03/15/22   [provider]  atorvastatin (LIPITOR) 40 MG tablet Take 1 tablet (40 mg total) by mouth every evening. 09/05/21   Hosie Poisson, MD  benzonatate (TESSALON) 100 MG capsule Take 2 capsules (200 mg total) by mouth every 8 (eight) hours. 05/28/22   Margarette Canada, NP  budesonide-formoterol [facility]) 160-4.5 MCG/ACT inhaler Inhale 2 puffs into the lungs 2 (two) times daily.    [provider]  Dextromethorphan-guaiFENesin  (MUCINEX DM) 30-600 MG TB12 SMARTSIG:1-2 Tablet(s) By Mouth Every 12 Hours PRN 04/28/22   [provider]  guaiFENesin-codeine (ROBITUSSIN AC) 100-10 MG/5ML syrup Take 5 mLs by mouth 3 (three) times daily as needed for cough. 05/28/22   Margarette Canada, NP  ipratropium (ATROVENT) 0.06 % nasal spray Place 2 sprays into both nostrils 4 (four) times daily. 05/28/22   Margarette Canada, NP  ipratropium-albuterol (DUONEB) 0.5-2.5 (3) MG/3ML SOLN Take 3 mLs by nebulization every 4 (four) hours as needed. Patient taking differently: Take 3 mLs by nebulization every 4 (four) hours as needed. Last dose: 730 am. 09/05/21   Hosie Poisson, MD  JARDIANCE 10 MG TABS tablet Take 10 mg  by mouth daily. 09/07/21   [provider]  metFORMIN (GLUCOPHAGE-XR) 500 MG 24 hr tablet Take 500 mg by mouth daily with breakfast.    [provider]  methocarbamol (ROBAXIN) 500 MG tablet Take 500 mg by mouth every 6 (six) hours as needed for muscle spasms. 06/21/21   [provider]  MIRALAX 17 g packet Take 17 g by mouth daily as needed for mild constipation. 06/23/21   [provider]  ondansetron (ZOFRAN-ODT) 4 MG disintegrating tablet Take 1 tablet (4 mg total) by mouth every 8 (eight) hours as needed. 09/01/22   Brimage, Vondra, DO  OZEMPIC, 0.25 OR 0.5 MG/DOSE, 2 MG/3ML SOPN Inject into the skin. 04/26/22   [provider]  pantoprazole (PROTONIX) 40 MG tablet Take 1 tablet (40 mg total) by mouth daily. 06/11/21   Tommie Sams, DO  Respiratory Therapy Supplies (ADULT MASK LARGE) MISC See admin instructions. use with inhaler 10/17/21   [provider]  Spacer/Aero-Holding Chambers (AEROCHAMBER PLUS) inhaler Use with inhaler 10/15/21   Domenick Gong, MD  tiotropium (SPIRIVA HANDIHALER) 18 MCG inhalation capsule Place 1 capsule (18 mcg total) into inhaler and inhale daily. Patient not taking: Reported on 06/05/2022 09/05/21   Kathlen Mody, MD  VICTOZA 18 MG/3ML SOPN     [provider]  VOLTAREN ARTHRITIS PAIN 1 % GEL Apply 2 g topically 4 (four) times daily. 03/22/22   [provider]  XOPENEX HFA 45 MCG/ACT inhaler Inhale 1 puff into the lungs 3 (three) times daily. 11/02/21   [provider]     Critical care time: 50 minutes     Harlon Ditty, AGACNP-BC [facility] Prefer epic messenger for cross cover needs If after hours, please call E-link

## 2022-09-21 NOTE — ED Provider Notes (Signed)
[facility] Provider Note    Event Date/Time   First MD Initiated Contact with Patient 09/21/22 1200     (approximate)   History   Weakness   HPI  Katherine Carter is a 72 y.o. female past medical history of COPD, diabetes, GERD, hypertension who presents with generalized weakness dyspnea cough.  Patient has been sick since last Saturday.  She endorses cough and dyspnea.  Has had chills and weakness not been eating and drinking.  She has had 2 falls in the last week she cannot really tell me why husband found her laying on the floor had been on the floor for about 3 hours yesterday.  She endorses bilateral leg weakness has had urinary and bowel incontinence no new back pain no numbness in the legs.     Past Medical History:  Diagnosis Date   COPD (chronic obstructive pulmonary disease) ([place])    Diabetes mellitus without complication ([place])    GERD (gastroesophageal reflux disease)    Hypertension     Patient Active Problem List   Diagnosis Date Noted   Pseudohyponatremia 06/06/2022   E-coli UTI    Uncontrolled type 2 diabetes mellitus with hyperglycemia, without long-term current use of insulin ([place]) 06/05/2022   COVID-19 virus infection 06/05/2022   Hyperkalemia 06/04/2022   AKI (acute kidney injury) ([place]) 06/04/2022   Acute bilateral lower abdominal pain 06/04/2022   Pure hypercholesterolemia    Elevated troponin    Acute respiratory failure with hypoxemia ([place]) 09/02/2021   Sepsis ([place]) 07/08/2018   CAP (community acquired pneumonia) 07/08/2018   HTN (hypertension) 07/08/2018   COPD with acute exacerbation ([place]) 07/08/2018   Acute respiratory failure with hypoxia and hypercapnia ([place]) 12/10/2015     Physical Exam  Triage Vital Signs: ED Triage Vitals  Enc Vitals Group     BP 09/21/22 1141 109/75     Pulse Rate 09/21/22 1141 (!) 133     Resp 09/21/22 1141 (!) 22     Temp 09/21/22 1141 98 F (36.7 C)     Temp src --      SpO2  09/21/22 1141 92 %     Weight --      Height --      Head Circumference --      Peak Flow --      Pain Score 09/21/22 1139 0     Pain Loc --      Pain Edu? --      Excl. in [place]? --     Most recent vital signs: Vitals:   09/21/22 1230 09/21/22 1430  BP: 101/68 133/77  Pulse: (!) 127 (!) 130  Resp: (!) 44 (!) 38  Temp:    SpO2: 95% 100%     General: Awake, patient appears ill CV:  Good peripheral perfusion.  No edema Resp:  Patient is tachypneic breathing through pursed lips expiratory wheezing Abd:  No distention.  Abdomen is soft and nontender Neuro:             Awake, Alert, Oriented x 3  Other:  5/5 with with hip flexion plantarflexion dorsiflexion bilaterally, sensation grossly intact in bilateral lower extremities   ED Results / Procedures / Treatments  Labs (all labs ordered are listed, but only abnormal results are displayed) Labs Reviewed  LACTIC ACID, PLASMA - Abnormal; Notable for the following components:      Result Value   Lactic Acid, Venous 2.7 (*)    All other components within  normal limits  COMPREHENSIVE METABOLIC PANEL - Abnormal; Notable for the following components:   Sodium 127 (*)    Potassium 5.3 (*)    Chloride 93 (*)    CO2 20 (*)    Glucose, Bld 589 (*)    BUN 42 (*)    Creatinine, Ser 1.89 (*)    Total Protein 9.1 (*)    Albumin 2.7 (*)    AST 53 (*)    ALT 55 (*)    Total Bilirubin 1.4 (*)    GFR, Estimated 28 (*)    All other components within normal limits  CBC WITH DIFFERENTIAL/PLATELET - Abnormal; Notable for the following components:   WBC 16.3 (*)    Neutro Abs 14.7 (*)    Abs Immature Granulocytes 0.14 (*)    All other components within normal limits  PROTIME-INR - Abnormal; Notable for the following components:   Prothrombin Time 15.6 (*)    INR 1.3 (*)    All other components within normal limits  URINALYSIS, COMPLETE (UACMP) WITH MICROSCOPIC - Abnormal; Notable for the following components:   Color, Urine YELLOW (*)     APPearance HAZY (*)    Glucose, UA >=500 (*)    Hgb urine dipstick SMALL (*)    Ketones, ur 5 (*)    Protein, ur 30 (*)    Leukocytes,Ua SMALL (*)    Bacteria, UA FEW (*)    All other components within normal limits  BRAIN NATRIURETIC PEPTIDE - Abnormal; Notable for the following components:   B Natriuretic Peptide 110.7 (*)    All other components within normal limits  BLOOD GAS, VENOUS - Abnormal; Notable for the following components:   pH, Ven 7.17 (*)    Acid-base deficit 8.0 (*)    All other components within normal limits  CBG MONITORING, ED - Abnormal; Notable for the following components:   Glucose-Capillary 474 (*)    All other components within normal limits  TROPONIN I (HIGH SENSITIVITY) - Abnormal; Notable for the following components:   Troponin I (High Sensitivity) 31 (*)    All other components within normal limits  RESP PANEL BY RT-PCR (RSV, FLU A&B, COVID)  RVPGX2  CULTURE, BLOOD (ROUTINE X 2)  CULTURE, BLOOD (ROUTINE X 2)  URINE CULTURE  APTT  LACTIC ACID, PLASMA  TROPONIN I (HIGH SENSITIVITY)     EKG  EKG shows sinus tach, RAD, somewhat peaked T waves   RADIOLOGY I reviewed and interpreted the CT scan of the brain which does not show any acute intracranial process    PROCEDURES:  Critical Care performed: Yes, see critical care procedure note(s)  .Critical Care  Performed by: Georga Hacking, MD Authorized by: Georga Hacking, MD   Critical care provider statement:    Critical care time (minutes):  75   Critical care was time spent personally by me on the following activities:  Development of treatment plan with patient or surrogate, discussions with consultants, evaluation of patient's response to treatment, examination of patient, ordering and review of laboratory studies, ordering and review of radiographic studies, ordering and performing treatments and interventions, pulse oximetry, re-evaluation of patient's condition and review of  old charts   The patient is on the cardiac monitor to evaluate for evidence of arrhythmia and/or significant heart rate changes.   MEDICATIONS ORDERED IN ED: Medications  lactated ringers infusion ( Intravenous New Bag/Given 09/21/22 1433)  lactated ringers bolus 1,000 mL (has no administration in time range)  vancomycin (VANCOREADY)  IVPB 1500 mg/300 mL (has no administration in time range)  insulin aspart (novoLOG) injection 10 Units (has no administration in time range)  sodium chloride 0.9 % bolus 1,000 mL (0 mLs Intravenous Stopped 09/21/22 1422)  ipratropium-albuterol (DUONEB) 0.5-2.5 (3) MG/3ML nebulizer solution 3 mL (3 mLs Nebulization Given 09/21/22 1358)  ipratropium-albuterol (DUONEB) 0.5-2.5 (3) MG/3ML nebulizer solution 3 mL (3 mLs Nebulization Given 09/21/22 1358)  ipratropium-albuterol (DUONEB) 0.5-2.5 (3) MG/3ML nebulizer solution 3 mL (3 mLs Nebulization Given 09/21/22 1358)  ceFEPIme (MAXIPIME) 2 g in sodium chloride 0.9 % 100 mL IVPB (0 g Intravenous Stopped 09/21/22 1421)  azithromycin (ZITHROMAX) 500 mg in sodium chloride 0.9 % 250 mL IVPB (500 mg Intravenous New Bag/Given 09/21/22 1420)  sodium chloride 0.9 % bolus 1,000 mL (1,000 mLs Intravenous New Bag/Given 09/21/22 1421)  methylPREDNISolone sodium succinate (SOLU-MEDROL) 125 mg/2 mL injection 60 mg (60 mg Intravenous Given 09/21/22 1449)  iohexol (OMNIPAQUE) 350 MG/ML injection 60 mL (60 mLs Intravenous Contrast Given 09/21/22 1603)     IMPRESSION / MDM / ASSESSMENT AND PLAN / ED COURSE  I reviewed the triage vital signs and the nursing notes.                              Patient's presentation is most consistent with acute presentation with potential threat to life or bodily function.  Differential diagnosis includes, but is not limited to, sepsis secondary pneumonia, UTI, viral illness, dehydration, COPD exacerbation, pulmonary embolism  The patient is a 72 year old female who is on 4 L nasal cannula chronically for  COPD who presents with cough weakness shortness of breath times about 5 days.  On arrival patient is tachycardic in the 150s tachypneic she is saturating okay on her baseline 4 L.  She looks ill she is breathing through pursed lips tachypneic with expiratory wheezing.  Tells me she has been progressively weak over the last several days feels weak in her legs and has had 2 falls she is also coughing and short of breath.  She looks dry and concern clinically for potential sepsis but differential is broad.  Will give fluid bolus and get chest x-ray CT head C-spine given the falls and give DuoNebs given her wheezing.  Patient's chest x-ray is concerning for an infiltrate versus asymmetric edema.  Clinically I am concerned for pneumonia.  Will cover with cefepime azithromycin.  CT head and C-spine do not have any acute findings.  On reevaluation patient is still tachycardic blood pressure is okay but she is tachypneic and working.  BMP results with significant hyperglycemia but no anion gap although albumin is low so I suspect she likely has some mild DKA.  She also has an AKI creatinine 1.89 hyper kalemia to 5.3, LFTs mildly elevated.  UA does have 11-20 white cells.  I have added on vancomycin in addition to cefepime and azithromycin given her critical illness.  Pain VBG which shows a pH of 7.17 pCO2 of 62 bicarb 20.  This is likely combination of metabolic and respiratory acidosis.  Given patient's work of breathing I am concerned about her potentially deteriorating and I placed her on BiPAP.  Had a discussion with the patient about intubation as I am concerned about her deteriorating.  Initially she said she did not want to be intubated but did want to be full code but explained to her that if she were to undergo resuscitation after cardiac arrest she would need to be  placed on a ventilator she then was quite clear that she does not want to be resuscitated or intubated.  Patient's husband is not really in  agreement with this but patient currently has capacity to make her own decisions.  Discussed with Dr. Kenna Gilbert with the ICU given patient's tenuous hemodynamic pain respiratory status.  She is in for CT angio.  Despite her AKI and GFR of 28 I think that this is necessary to rule out pulmonary embolism.  I suspect that her AKI will significantly improve with fluids she is getting.     FINAL CLINICAL IMPRESSION(S) / ED DIAGNOSES   Final diagnoses:  Sepsis, due to unspecified organism, unspecified whether acute organ dysfunction present (HCC)  Pneumonia due to infectious organism, unspecified laterality, unspecified part of lung  Hyperglycemia  Metabolic acidosis  Respiratory acidosis     Rx / DC Orders   ED Discharge Orders     None        Note:  This document was prepared using Dragon voice recognition software and may include unintentional dictation errors.   Georga Hacking, MD 09/21/22 [phone]

## 2022-09-21 NOTE — Progress Notes (Signed)
CODE SEPSIS - PHARMACY COMMUNICATION  **Broad Spectrum Antibiotics should be administered within 1 hour of Sepsis diagnosis**  Time Code Sepsis Called/Page Received: 1221  Antibiotics Ordered: Cefepime + azithromycin  Time of 1st antibiotic administration: 1357  Additional action taken by pharmacy: Discussed with RN. Difficulty obtaining IV access which resulted in delay of antibiotic administration  Benita Gutter 09/21/2022  12:25 PM

## 2022-09-21 NOTE — Consult Note (Signed)
Pharmacy Antibiotic Note  Katherine Carter is a 72 y.o. female admitted on 09/21/2022 with pneumonia. PMH significant for COPD, GERD, HTN, DM.  Pharmacy has been consulted for vancomycin and cefepime dosing.  Plan: Day 1 of antibiotics Give Vancomycin 1500 mg IV x1 and check random vancomycin level tomorrow given AKI Start cefepime 2 g IV Q24H Patient is also on azithromycin 500 mg IV Q24H Continue to monitor renal function and follow culture results    Temp (24hrs), Avg:98 F (36.7 C), Min:98 F (36.7 C), Max:98 F (36.7 C)  Recent Labs  Lab 09/21/22 1341  WBC 16.3*  CREATININE 1.89*  LATICACIDVEN 2.7*    CrCl cannot be calculated (Unknown ideal weight.).    Allergies  Allergen Reactions   Penicillins Hives and Other (See Comments)    Has patient had a PCN reaction causing immediate rash, facial/tongue/throat swelling, SOB or lightheadedness with hypotension: No Has patient had a PCN reaction causing severe rash involving mucus membranes or skin necrosis: No Has patient had a PCN reaction that required hospitalization: No Has patient had a PCN reaction occurring within the last 10 years: No If all of the above answers are "NO", then may proceed with Cephalosporin use.    Antimicrobials this admission: 1/18 Azithromycin >>  1/18 Vancomycin >>  1/18 Cefepime >>  Dose adjustments this admission: N/A  Microbiology results: 1/18 BCx: IP 1/18 UCx: IP  1/18 MRSA PCR: ordered  Thank you for allowing pharmacy to be a part of this patient's care.  Gretel Acre, PharmD PGY1 Pharmacy Resident 09/21/2022 5:05 PM

## 2022-09-21 NOTE — Progress Notes (Signed)
Multiple attempts made for sticks for labs and IV's, IV team called

## 2022-09-21 NOTE — Progress Notes (Signed)
Notified bedside nurse of need to draw lactic acid and blood cultures.  

## 2022-09-21 NOTE — Procedures (Signed)
Central Venous Catheter Insertion Procedure Note  Elana Jian  [id number]  1951-08-03  Date:09/21/22  Time:6:46 PM   Provider Performing:Azarian Starace D Dewaine Conger   Procedure: Insertion of Non-tunneled Central Venous [phone]) with US guidance ([id number])   Indication(s) Medication administration and Difficult access  Consent Risks of the procedure as well as the alternatives and risks of each were explained to the patient and/or caregiver.  Consent for the procedure was obtained and is signed in the bedside chart  Anesthesia Topical only with 1% lidocaine   Timeout Verified patient identification, verified procedure, site/side was marked, verified correct patient position, special equipment/implants available, medications/allergies/relevant history reviewed, required imaging and test results available.  Sterile Technique Maximal sterile technique including full sterile barrier drape, hand hygiene, sterile gown, sterile gloves, mask, hair covering, sterile ultrasound probe cover (if used).  Procedure Description Area of catheter insertion was cleaned with chlorhexidine and draped in sterile fashion.  With real-time ultrasound guidance a central venous catheter was placed into the right femoral vein. Nonpulsatile blood flow and easy flushing noted in all ports.  The catheter was sutured in place and sterile dressing applied.  Complications/Tolerance None; patient tolerated the procedure well. Chest X-ray is ordered to verify placement for internal jugular or subclavian cannulation.   Chest x-ray is not ordered for femoral cannulation.  EBL Minimal  Specimen(s) None   Line secured at the 20 cm mark. BIOPATCH applied to the insertion site.   Darel Hong, AGACNP-BC [facility] Prefer epic messenger for cross cover needs If after hours, please call E-link

## 2022-09-21 NOTE — ED Triage Notes (Signed)
Pt comes with c/o increased weakness, cough and falls. Pt states this started on Friday. Pt states dizziness, cp and sob. Pt wears 4L O2 at home.

## 2022-09-21 NOTE — ED Triage Notes (Signed)
First Nurse Note:  C/O bilateral leg weakness x 1 week.  Also c/o multiple falls this week.  Also c/o incontinence.  Wears home oxygen.  Patient is AAOx3.  Skin warm and dry.

## 2022-09-21 NOTE — Progress Notes (Signed)
Notified provider of need to order antibiotics and fluid bolus.  

## 2022-09-21 NOTE — Progress Notes (Signed)
Elink monitoring for the code sepsis protocol.  

## 2022-09-21 NOTE — Progress Notes (Signed)
Notified bedside nurse of need to draw repeat lactic acid. 

## 2022-09-22 ENCOUNTER — Inpatient Hospital Stay: Payer: Medicare Other

## 2022-09-22 DIAGNOSIS — J9621 Acute and chronic respiratory failure with hypoxia: Secondary | ICD-10-CM | POA: Diagnosis not present

## 2022-09-22 LAB — BASIC METABOLIC PANEL
Anion gap: 7 (ref 5–15)
BUN: 31 mg/dL — ABNORMAL HIGH (ref 8–23)
CO2: 18 mmol/L — ABNORMAL LOW (ref 22–32)
Calcium: 8.7 mg/dL — ABNORMAL LOW (ref 8.9–10.3)
Chloride: 112 mmol/L — ABNORMAL HIGH (ref 98–111)
Creatinine, Ser: 1.16 mg/dL — ABNORMAL HIGH (ref 0.44–1.00)
GFR, Estimated: 50 mL/min — ABNORMAL LOW (ref >60)
Glucose, Bld: 191 mg/dL — ABNORMAL HIGH (ref 70–99)
Potassium: 4.4 mmol/L (ref 3.5–5.1)
Sodium: 137 mmol/L (ref 135–145)

## 2022-09-22 LAB — LACTIC ACID, PLASMA
Lactic Acid, Venous: 1.8 mmol/L (ref 0.5–1.9)
Lactic Acid, Venous: 2.4 mmol/L (ref 0.5–1.9)

## 2022-09-22 LAB — CBC
HCT: 30.1 % — ABNORMAL LOW (ref 36.0–46.0)
Hemoglobin: 9.5 g/dL — ABNORMAL LOW (ref 12.0–15.0)
MCH: 28.8 pg (ref 26.0–34.0)
MCHC: 31.6 g/dL (ref 30.0–36.0)
MCV: 91.2 fL (ref 80.0–100.0)
Platelets: 188 10*3/uL (ref 150–400)
RBC: 3.3 MIL/uL — ABNORMAL LOW (ref 3.87–5.11)
RDW: 15.1 % (ref 11.5–15.5)
WBC: 13.8 10*3/uL — ABNORMAL HIGH (ref 4.0–10.5)
nRBC: 0 % (ref 0.0–0.2)

## 2022-09-22 LAB — HEPATIC FUNCTION PANEL
ALT: 36 U/L (ref 0–44)
AST: 34 U/L (ref 15–41)
Albumin: 1.8 g/dL — ABNORMAL LOW (ref 3.5–5.0)
Alkaline Phosphatase: 84 U/L (ref 38–126)
Bilirubin, Direct: 0.1 mg/dL (ref 0.0–0.2)
Indirect Bilirubin: 0.4 mg/dL (ref 0.3–0.9)
Total Bilirubin: 0.5 mg/dL (ref 0.3–1.2)
Total Protein: 6.8 g/dL (ref 6.5–8.1)

## 2022-09-22 LAB — BLOOD GAS, VENOUS
Acid-base deficit: 4.5 mmol/L — ABNORMAL HIGH (ref 0.0–2.0)
Bicarbonate: 19.2 mmol/L — ABNORMAL LOW (ref 20.0–28.0)
O2 Saturation: 99.8 %
Patient temperature: 37
pCO2, Ven: 31 mmHg — ABNORMAL LOW (ref 44–60)
pH, Ven: 7.4 (ref 7.25–7.43)
pO2, Ven: 183 mmHg — ABNORMAL HIGH (ref 32–45)

## 2022-09-22 LAB — GLUCOSE, CAPILLARY
Glucose-Capillary: 167 mg/dL — ABNORMAL HIGH (ref 70–99)
Glucose-Capillary: 172 mg/dL — ABNORMAL HIGH (ref 70–99)
Glucose-Capillary: 178 mg/dL — ABNORMAL HIGH (ref 70–99)
Glucose-Capillary: 180 mg/dL — ABNORMAL HIGH (ref 70–99)
Glucose-Capillary: 195 mg/dL — ABNORMAL HIGH (ref 70–99)
Glucose-Capillary: 196 mg/dL — ABNORMAL HIGH (ref 70–99)
Glucose-Capillary: 196 mg/dL — ABNORMAL HIGH (ref 70–99)
Glucose-Capillary: 230 mg/dL — ABNORMAL HIGH (ref 70–99)
Glucose-Capillary: 241 mg/dL — ABNORMAL HIGH (ref 70–99)
Glucose-Capillary: 304 mg/dL — ABNORMAL HIGH (ref 70–99)

## 2022-09-22 LAB — RENAL FUNCTION PANEL
Albumin: 1.9 g/dL — ABNORMAL LOW (ref 3.5–5.0)
Anion gap: 2 — ABNORMAL LOW (ref 5–15)
BUN: 28 mg/dL — ABNORMAL HIGH (ref 8–23)
CO2: 24 mmol/L (ref 22–32)
Calcium: 8.7 mg/dL — ABNORMAL LOW (ref 8.9–10.3)
Chloride: 111 mmol/L (ref 98–111)
Creatinine, Ser: 1.06 mg/dL — ABNORMAL HIGH (ref 0.44–1.00)
GFR, Estimated: 56 mL/min — ABNORMAL LOW (ref >60)
Glucose, Bld: 194 mg/dL — ABNORMAL HIGH (ref 70–99)
Phosphorus: 3.2 mg/dL (ref 2.5–4.6)
Potassium: 4.1 mmol/L (ref 3.5–5.1)
Sodium: 137 mmol/L (ref 135–145)

## 2022-09-22 LAB — BETA-HYDROXYBUTYRIC ACID: Beta-Hydroxybutyric Acid: <0.05 mmol/L — ABNORMAL LOW (ref 0.05–0.27)

## 2022-09-22 LAB — URINE CULTURE: Culture: <10000 — AB

## 2022-09-22 LAB — HEMOGLOBIN A1C
Hgb A1c MFr Bld: 9 % — ABNORMAL HIGH (ref 4.8–5.6)
Mean Plasma Glucose: 211.6 mg/dL

## 2022-09-22 LAB — PROCALCITONIN: Procalcitonin: 11.94 ng/mL

## 2022-09-22 LAB — MAGNESIUM: Magnesium: 2.1 mg/dL (ref 1.7–2.4)

## 2022-09-22 MED ORDER — IOHEXOL 350 MG/ML SOLN
75.0000 mL | Freq: Once | INTRAVENOUS | Status: AC | PRN
  Administered 2022-09-22: 75 mL via INTRAVENOUS

## 2022-09-22 MED ORDER — ATORVASTATIN CALCIUM 20 MG PO TABS
40.0000 mg | ORAL_TABLET | Freq: Every evening | ORAL | Status: DC
  Administered 2022-09-22 – 2022-10-01 (×6): 40 mg via ORAL
  Filled 2022-09-22 (×6): qty 2

## 2022-09-22 MED ORDER — GUAIFENESIN ER 600 MG PO TB12
600.0000 mg | ORAL_TABLET | Freq: Two times a day (BID) | ORAL | Status: DC | PRN
  Administered 2022-09-22 – 2022-10-01 (×2): 600 mg via ORAL
  Filled 2022-09-22 (×3): qty 1

## 2022-09-22 MED ORDER — IPRATROPIUM-ALBUTEROL 0.5-2.5 (3) MG/3ML IN SOLN: 3.0000 mL | RESPIRATORY_TRACT | Status: DC

## 2022-09-22 MED ORDER — INSULIN ASPART 100 UNIT/ML IJ SOLN
0.0000 [IU] | Freq: Three times a day (TID) | INTRAMUSCULAR | Status: DC
  Administered 2022-09-22: 4 [IU] via SUBCUTANEOUS
  Filled 2022-09-22: qty 1

## 2022-09-22 MED ORDER — MORPHINE SULFATE (PF) 2 MG/ML IV SOLN
1.0000 mg | INTRAVENOUS | Status: DC | PRN
  Administered 2022-09-22: 1 mg via INTRAVENOUS
  Administered 2022-09-23 – 2022-09-29 (×4): 2 mg via INTRAVENOUS
  Filled 2022-09-22 (×5): qty 1

## 2022-09-22 MED ORDER — SODIUM CHLORIDE 0.9 % IV SOLN
2.0000 g | Freq: Two times a day (BID) | INTRAVENOUS | Status: AC
  Administered 2022-09-22 – 2022-09-28 (×14): 2 g via INTRAVENOUS
  Filled 2022-09-22 (×4): qty 2
  Filled 2022-09-22: qty 12.5
  Filled 2022-09-22 (×7): qty 2
  Filled 2022-09-22: qty 12.5
  Filled 2022-09-22: qty 2

## 2022-09-22 MED ORDER — INSULIN ASPART 100 UNIT/ML IJ SOLN
0.0000 [IU] | INTRAMUSCULAR | Status: DC
  Administered 2022-09-23: 7 [IU] via SUBCUTANEOUS
  Administered 2022-09-23: 4 [IU] via SUBCUTANEOUS
  Administered 2022-09-23: 7 [IU] via SUBCUTANEOUS
  Filled 2022-09-22 (×3): qty 1

## 2022-09-22 MED ORDER — BUDESONIDE 0.5 MG/2ML IN SUSP: 0.5000 mg | Freq: Two times a day (BID) | RESPIRATORY_TRACT | Status: DC

## 2022-09-22 MED ORDER — INSULIN DETEMIR 100 UNIT/ML ~~LOC~~ SOLN
0.3000 [IU]/kg | SUBCUTANEOUS | Status: DC
  Administered 2022-09-22 – 2022-09-24 (×3): 21 [IU] via SUBCUTANEOUS
  Administered 2022-09-25: 10.5 [IU] via SUBCUTANEOUS
  Administered 2022-09-26 – 2022-09-29 (×4): 21 [IU] via SUBCUTANEOUS
  Filled 2022-09-22 (×10): qty 0.21

## 2022-09-22 MED ORDER — BENZONATATE 100 MG PO CAPS
200.0000 mg | ORAL_CAPSULE | Freq: Two times a day (BID) | ORAL | Status: DC | PRN
  Administered 2022-09-22 – 2022-10-01 (×3): 200 mg via ORAL
  Filled 2022-09-22 (×3): qty 2

## 2022-09-22 MED ORDER — INSULIN ASPART 100 UNIT/ML IJ SOLN
0.0000 [IU] | INTRAMUSCULAR | Status: DC
  Administered 2022-09-22 (×2): 4 [IU] via SUBCUTANEOUS
  Administered 2022-09-22: 15 [IU] via SUBCUTANEOUS
  Filled 2022-09-22 (×3): qty 1

## 2022-09-22 NOTE — Progress Notes (Signed)
NAME:  Katherine Carter, MRN:  [id number], DOB:  03-12-1951, LOS: 1 ADMISSION DATE:  09/21/2022, CONSULTATION DATE:  09/21/2021 REFERRING MD:  Dr. Starleen Blue, CHIEF COMPLAINT:  Weakness s/p falls, shortness of breath, cough   Brief Pt Description / Synopsis:  72 y.o. female, DNR/DNI, with PMHx significant for COPD on 4L supplemental O2 admitted with Acute on Chronic Hypoxic Respiratory Failure and severe sepsis in the setting of suspected Multifocal Pneumonia and AECOPD requiring BiPAP.  Also found to have AKI and concern for mild DKA.  History of Present Illness:  Katherine Carter is a 72 year old female with a past medical history significant for chronic hypoxic respiratory failure requiring 4 L supplemental oxygen due to COPD, diabetes mellitus, hypertension, GERD who presents to [facility] ED on 09/21/2022 due to generalized weakness status post multiple falls, dyspnea, and cough.  Patient is currently on BiPAP with increased work of breathing, therefore history is obtained from the patient's husband at bedside along with chart review.  Her husband reports that last Saturday she began to have cough and shortness of breath, which have worsened since then.  She is also had generalized weakness status post 2 falls, poor p.o. intake, and dysuria.  He denies any knowledge that she has had any chest pain, dizziness, abdominal pain, nausea, vomiting, diarrhea, fever, chills.  ED Course: Initial Vital Signs: Temperature 98 Fahrenheit orally, pulse 133, respiratory rate 22, blood pressure 109/75, SpO2 92% Significant Labs: Sodium 127, potassium 5.3, chloride 93, bicarb 20, anion gap 14, albumin 2.7, glucose 10/10/1987, BUN 42, creatinine 1.89, AST 53, ALT 55, total bili 1.4, BNP 110, high-sensitivity troponin 31, WBC 16.3 with neutrophilia, lactic acid 2.7, INR 1.3, PT 15.6 VBG on BiPAP: pH 7.17/pCO2 58/pO2/bicarb 21.2 EKG EKG shows sinus tach, RAD, somewhat peaked T waves Imaging Chest  X-ray>>IMPRESSION: 1. New patchy right greater than left perihilar opacities and pulmonary venous congestion. Differential considerations include asymmetric edema versus infection or aspiration. 2.  Aortic Atherosclerosis (ICD10-I70.0). CT head>>IMPRESSION: 1. No acute intracranial abnormality. 2. Chronic small vessel ischemic disease. CT cervical spine>>IMPRESSION: 1. Mildly motion degraded exam. 2. No evidence of acute fracture to the cervical spine. 3. 2-3 mm grade 1 anterolisthesis at C4-C5. 4. Cervical spondylosis, as described. Multilevel spinal canal stenosis. Most notably, a central disc protrusion contributes to at least moderate spinal canal stenosis at C3-C4. Multilevel bony neural foraminal narrowing also present within the cervical spine. A cervical spine MRI may be obtained for further evaluation, as clinically warranted. 5.  Aortic Atherosclerosis (ICD10-I70.0). Medications Administered: 3 L IV fluid boluses, IV's azithromycin, cefepime, vancomycin, 60 mg IV Solu-Medrol, multiple DuoNebs  On arrival to the ED she was tachycardic in the 150s and tachypneic, but initially saturating okay on her baseline 4 L.  She was noted to be pursed lip breathing with expiratory wheezing. Given her work of breathing, patient was placed on BiPAP.  Patient is very adamant that she would not want to be intubated or have CPR or ACLS performed.  PCCM asked to admit for further workup and treatment.  Please see "significant hospital events" section below for full detailed hospital course.  Pertinent  Medical History   Past Medical History:  Diagnosis Date   COPD (chronic obstructive pulmonary disease) ([place])    Diabetes mellitus without complication (HCC)    GERD (gastroesophageal reflux disease)    Hypertension     Micro Data:  1/18: SARS-CoV-2/RSV/influenza PCR>> negative 1/18: Blood culture x 2>> NGTD 1/18: Urine>> 1/18: Respiratory viral panel>> negative 1/18:  Strep pneumo and  Legionella urinary antigens>> negative 1/18: Mycoplasma>> 1/18: MRSA PCR>>negative  Antimicrobials:  Azithromycin 1/18>> Cefepime 1/18>> Vancomycin 1/18>>1/19  Significant Hospital Events: Including procedures, antibiotic start and stop dates in addition to other pertinent events   1/18: Presents to [facility] ED, with severe respiratory distress requiring BiPAP.  Patient adamant she would want to be DNR/DNI.  PCCM asked to admit 1/19: Weaned off BiPAP to baseline 4L [place].  DKI resolved, AKI improving.  PT/OT consulted.  Vancomycin d/c as MRSA PCR is negative  Interim History / Subjective:  -No significant events noted overnight -Afebrile, hemodynamically stable -Weaned off of BiPAP to baseline 4L [place] ~currently tolerating but respiratory status still remains tenuous -DKA resolved, AKI improving -Patient is awake, sitting in bed, eating breakfast -Still reports some shortness of breath above baseline, however much improved as compared to yesterday -Also reports dry nonproductive cough   Objective   Blood pressure 116/68, pulse 97, temperature 98.4 F (36.9 C), temperature source Axillary, resp. rate (!) 22, height 5\' 3"  (1.6 m), weight 69.6 kg, SpO2 96 %.    FiO2 (%):  [40 %] 40 %   Intake/Output Summary (Last 24 hours) at 09/22/2022 0830 Last data filed at 09/22/2022 0700 Gross per 24 hour  Intake 1207.35 ml  Output 1525 ml  Net -317.65 ml   Filed Weights   09/21/22 1703 09/21/22 1815  Weight: 69.4 kg 69.6 kg    Examination: General: Acute on chronically ill-appearing female, laying in bed, on 4L [place], in NAD HENT: Atraumatic, normocephalic, neck supple, no JVD, dry mucous membranes Lungs: Diminished breath sounds throughout, mild pursed lip breathing, even, nonlabored Cardiovascular: Tachycardia, regular rhythm, S1-S2, no murmurs, rubs, gallops Abdomen: Soft, nontender, nondistended, no guarding or rebound tenderness, bowel sounds positive x 4 Extremities: Normal bulk and  tone,generalized weakness, no deformities, no edema, no cyanosis Neuro: Awake and alert, oriented x 3, moves all extremities to commands, no focal deficits GU: External catheter in place draining yellow urine  Resolved Hospital Problem list     Assessment & Plan:   #Acute on Chronic Hypoxic Respiratory Failure in the setting of AECOPD & suspected community-acquired pneumonia PMHx: COPD on 4L supplemental O2 at baseline -Supplemental O2 as needed to maintain O2 sats 88 to 92% -BiPAP, wean as tolerated (pt is DNR/DNI) ~ weaned off -Follow intermittent Chest X-ray & ABG as needed -Bronchodilators & Pulmicort nebs -IV Steroids -ABX as above -Diuresis as BP and renal function permits -Pulmonary toilet as able -Low dose prn Morphine air hunger -CTA Chest unable to be obtained as IVs blew with trying to administer contrast  #Severe Sepsis due to suspected community-acquired Pneumonia & UTI (meets SIRS Criteria at admission: HR >90, RR >20, WBC 16.3) -Monitor fever curve -Trend WBC's & Procalcitonin -Follow cultures as above -Continue empiric Azithromycin, Cefepime pending cultures & sensitivities  #Acute Kidney Injury ~ IMPROVING #Hypertonic Hyponatremia in setting of severe hyperglycemia (corrected Na for glucose is Na 135) ~ RESOLVED #Mild Hyperkalemia ~ RESOLVED #Anion Gap Metabolic Acidosis ~ RESOLVED -Monitor I&O's / urinary output -Follow BMP -Ensure adequate renal perfusion -Avoid nephrotoxic agents as able -Replace electrolytes as indicated ~ Pharmacy consulted for assistance with replacement -IV fluids stopped this patient now tolerating p.o. -CK normal at 68  #Diabetes Mellitus Type II #Concern for Mild DKA ~ RESOLVED -CBG's q4h; Target range of 140 to 180 -SSI -Follow ICU Hypo/Hyperglycemia protocol  #Mildly Elevated LFT's ~ RESOLVED -Trend LFT's -Consider RUQ 09/23/22     Best Practice (right  click and "Reselect all SmartList Selections" daily)   Diet/type: Heart  healthy, low carb DVT prophylaxis: prophylactic heparin  GI prophylaxis: PPI Lines: Right femoral CVC (yes due to limited Peripheral access) Foley:  N/A Code Status:  DNR Last date of multidisciplinary goals of care discussion [09/22/2021]  1/19: Pt and her 2 sisters updated at bedside.  Will update her husband when he arrives.  Labs   CBC: Recent Labs  Lab 09/21/22 1341 09/22/22 0500  WBC 16.3* 13.8*  NEUTROABS 14.7*  --   HGB 13.2 9.5*  HCT 41.7 30.1*  MCV 91.0 91.2  PLT 255 188     Basic Metabolic Panel: Recent Labs  Lab 09/21/22 1341 09/21/22 1903 09/21/22 2349 09/22/22 0500  NA 127*  127* 133* 137 137  K 5.4*  5.3* 4.7 4.4 4.1  CL 94*  93* 106 112* 111  CO2 19*  20* 18* 18* 24  GLUCOSE 574*  589* 403* 191* 194*  BUN 41*  42* 34* 31* 28*  CREATININE 1.88*  1.89* 1.54* 1.16* 1.06*  CALCIUM 9.3  9.5 8.3* 8.7* 8.7*  MG 2.5*  --   --  2.1  PHOS  --   --   --  3.2    GFR: Estimated Creatinine Clearance: 45.6 mL/min (A) (by C-G formula based on SCr of 1.06 mg/dL (H)). Recent Labs  Lab 09/21/22 1341 09/21/22 1649 09/21/22 2349 09/22/22 0500  PROCALCITON 13.55  --   --  11.94  WBC 16.3*  --   --  13.8*  LATICACIDVEN 2.7* 3.9* 2.4* 1.8     Liver Function Tests: Recent Labs  Lab 09/21/22 1341 09/22/22 0500  AST 53*  --   ALT 55*  --   ALKPHOS 120  --   BILITOT 1.4*  --   PROT 9.1*  --   ALBUMIN 2.7* 1.9*    No results for input(s): "LIPASE", "AMYLASE" in the last 168 hours. No results for input(s): "AMMONIA" in the last 168 hours.  ABG    Component Value Date/Time   PHART 7.33 (L) 09/02/2021 2117   PCO2ART 51 (H) 09/02/2021 2117   PO2ART 249 (H) 09/02/2021 2117   HCO3 19.2 (L) 09/21/2022 2349   ACIDBASEDEF 4.5 (H) 09/21/2022 2349   O2SAT 99.8 09/21/2022 2349     Coagulation Profile: Recent Labs  Lab 09/21/22 1341  INR 1.3*     Cardiac Enzymes: Recent Labs  Lab 09/21/22 1341  CKTOTAL 68    HbA1C: Hgb A1c MFr Bld   Date/Time Value Ref Range Status  06/04/2022 03:37 PM 8.5 (H) 4.8 - 5.6 % Final    Comment:    (NOTE) Pre diabetes:          5.7%-6.4%  Diabetes:              >6.4%  Glycemic control for   <7.0% adults with diabetes   09/03/2021 10:47 AM 9.3 (H) 4.8 - 5.6 % Final    Comment:    (NOTE) Pre diabetes:          5.7%-6.4%  Diabetes:              >6.4%  Glycemic control for   <7.0% adults with diabetes     CBG: Recent Labs  Lab 09/22/22 0120 09/22/22 0222 09/22/22 0322 09/22/22 0448 09/22/22 0735  GLUCAP 178* 172* 167* 195* 196*     Review of Systems:   Positives in BOLD: Gen: Denies fever, chills, weight change, fatigue, night sweats HEENT: Denies  blurred vision, double vision, hearing loss, tinnitus, sinus congestion, rhinorrhea, sore throat, neck stiffness, dysphagia PULM: Denies shortness of breath, cough, sputum production, hemoptysis, wheezing CV: Denies chest pain, edema, orthopnea, paroxysmal nocturnal dyspnea, palpitations GI: Denies abdominal pain, nausea, vomiting, diarrhea, hematochezia, melena, constipation, change in bowel habits GU: Denies dysuria, hematuria, polyuria, oliguria, urethral discharge Endocrine: Denies hot or cold intolerance, polyuria, polyphagia or appetite change Derm: Denies rash, dry skin, scaling or peeling skin change Heme: Denies easy bruising, bleeding, bleeding gums Neuro: Denies headache, numbness, weakness, slurred speech, loss of memory or consciousness   Past Medical History:  She,  has a past medical history of COPD (chronic obstructive pulmonary disease) ([place]), Diabetes mellitus without complication ([place]), GERD (gastroesophageal reflux disease), and Hypertension.   Surgical History:   Past Surgical History:  Procedure Laterality Date   ABDOMINAL HYSTERECTOMY     COLONOSCOPY WITH PROPOFOL N/A 01/23/2017   Procedure: COLONOSCOPY WITH PROPOFOL;  Surgeon: Lollie Sails, MD;  Location: [facility] ENDOSCOPY;  Service:  Endoscopy;  Laterality: N/A;   FOOT SURGERY       Social History:   reports that she quit smoking about 8 years ago. Her smoking use included cigarettes. She has a 12.50 pack-year smoking history. She has never used smokeless tobacco. She reports that she does not drink alcohol and does not use drugs.   Family History:  Her family history includes CAD in her father and mother. There is no history of Breast cancer.   Allergies Allergies  Allergen Reactions   Penicillins Hives and Other (See Comments)    Has patient had a PCN reaction causing immediate rash, facial/tongue/throat swelling, SOB or lightheadedness with hypotension: No Has patient had a PCN reaction causing severe rash involving mucus membranes or skin necrosis: No Has patient had a PCN reaction that required hospitalization: No Has patient had a PCN reaction occurring within the last 10 years: No If all of the above answers are "NO", then may proceed with Cephalosporin use.     Home Medications  Prior to Admission medications   Medication Sig Start Date End Date Taking? Authorizing Provider  celecoxib (CELEBREX) 100 MG capsule Take 100 mg by mouth daily. 08/10/22  Yes [provider]  fluticasone (FLONASE) 50 MCG/ACT nasal spray Place 1 spray into both nostrils daily. 09/06/22  Yes [provider]  latanoprost (XALATAN) 0.005 % ophthalmic solution Place 1 drop into both eyes 2 (two) times daily. 08/04/22  Yes [provider]  losartan (COZAAR) 100 MG tablet Take 100 mg by mouth daily. 08/24/22  Yes [provider]  ondansetron (ZOFRAN) 4 MG tablet Take 4 mg by mouth every 8 (eight) hours as needed for nausea or vomiting. 07/04/22  Yes [provider]  TRULICITY 5.02 DX/4.1OI SOPN Inject 0.75 mg into the skin once a week. 06/28/22  Yes [provider]  albuterol (VENTOLIN HFA) 108 (90 Base) MCG/ACT inhaler Inhale 2 puffs into the lungs every 4 (four) hours as needed for  wheezing or shortness of breath.    [provider]  amLODipine (NORVASC) 5 MG tablet Take 1 tablet (5 mg total) by mouth daily. 09/05/21   Hosie Poisson, MD  aspirin EC 81 MG tablet Take 81 mg by mouth daily. 03/15/22   [provider]  atorvastatin (LIPITOR) 40 MG tablet Take 1 tablet (40 mg total) by mouth every evening. 09/05/21   Hosie Poisson, MD  benzonatate (TESSALON) 100 MG capsule Take 2 capsules (200 mg total) by mouth every 8 (  eight) hours. 05/28/22   Becky Augusta, NP  budesonide-formoterol [facility]) 160-4.5 MCG/ACT inhaler Inhale 2 puffs into the lungs 2 (two) times daily.    [provider]  Dextromethorphan-guaiFENesin (MUCINEX DM) 30-600 MG TB12 SMARTSIG:1-2 Tablet(s) By Mouth Every 12 Hours PRN 04/28/22   [provider]  guaiFENesin-codeine (ROBITUSSIN AC) 100-10 MG/5ML syrup Take 5 mLs by mouth 3 (three) times daily as needed for cough. 05/28/22   Becky Augusta, NP  ipratropium (ATROVENT) 0.06 % nasal spray Place 2 sprays into both nostrils 4 (four) times daily. 05/28/22   Becky Augusta, NP  ipratropium-albuterol (DUONEB) 0.5-2.5 (3) MG/3ML SOLN Take 3 mLs by nebulization every 4 (four) hours as needed. Patient taking differently: Take 3 mLs by nebulization every 4 (four) hours as needed. Last dose: 730 am. 09/05/21   Kathlen Mody, MD  JARDIANCE 10 MG TABS tablet Take 10 mg by mouth daily. 09/07/21   [provider]  metFORMIN (GLUCOPHAGE-XR) 500 MG 24 hr tablet Take 500 mg by mouth daily with breakfast.    [provider]  methocarbamol (ROBAXIN) 500 MG tablet Take 500 mg by mouth every 6 (six) hours as needed for muscle spasms. 06/21/21   [provider]  MIRALAX 17 g packet Take 17 g by mouth daily as needed for mild constipation. 06/23/21   [provider]  ondansetron (ZOFRAN-ODT) 4 MG disintegrating tablet Take 1 tablet (4 mg total) by mouth every 8 (eight) hours as needed. 09/01/22   Brimage, Vondra, DO  OZEMPIC,  0.25 OR 0.5 MG/DOSE, 2 MG/3ML SOPN Inject into the skin. 04/26/22   [provider]  pantoprazole (PROTONIX) 40 MG tablet Take 1 tablet (40 mg total) by mouth daily. 06/11/21   Tommie Sams, DO  Respiratory Therapy Supplies (ADULT MASK LARGE) MISC See admin instructions. use with inhaler 10/17/21   [provider]  Spacer/Aero-Holding Chambers (AEROCHAMBER PLUS) inhaler Use with inhaler 10/15/21   Domenick Gong, MD  tiotropium (SPIRIVA HANDIHALER) 18 MCG inhalation capsule Place 1 capsule (18 mcg total) into inhaler and inhale daily. Patient not taking: Reported on 06/05/2022 09/05/21   Kathlen Mody, MD  VICTOZA 18 MG/3ML SOPN     [provider]  VOLTAREN ARTHRITIS PAIN 1 % GEL Apply 2 g topically 4 (four) times daily. 03/22/22   [provider]  XOPENEX HFA 45 MCG/ACT inhaler Inhale 1 puff into the lungs 3 (three) times daily. 11/02/21   [provider]     Critical Care time: 40 minutes     Harlon Ditty, AGACNP-BC [facility] Prefer epic messenger for cross cover needs If after hours, please call E-link

## 2022-09-22 NOTE — Inpatient Diabetes Management (Signed)
Inpatient Diabetes Program Recommendations  AACE/ADA: New Consensus Statement on Inpatient Glycemic Control (2015)  Target Ranges:  Prepandial:   less than 140 mg/dL      Peak postprandial:   less than 180 mg/dL (1-2 hours)      Critically ill patients:  140 - 180 mg/dL   Lab Results  Component Value Date   GLUCAP 304 (H) 09/22/2022   HGBA1C 9.0 (H) 09/22/2022    Review of Glycemic Control  Latest Reference Range & Units 09/22/22 07:35 09/22/22 11:17  Glucose-Capillary 70 - 99 mg/dL 196 (H) 304 (H)  (H): Data is abnormally high  Diabetes history: DM2  Outpatient Diabetes medications:  Victoza 0.6 mg QD Jardiance 10 mg QD Metformin 500 mg QD Ozempic (not taking) Trulicity (not taking)  Current orders for Inpatient glycemic control:  Levemir 21 units QD Novolog 0-20 units Q4H Solumedrol 40 mg QD  Inpatient Diabetes Program Recommendations:   Novolog 0-20 units TID and HS as she is eating. Novolog 3 units TID with meals is postprandials consistently elevated.  Spoke with her at bedside.  She verbalizes above medications.  She was started on Ozempic in December but could not tolerate the GI side effects.  She was switched to Victoza.  She checks her glucose daily at home.  Current with her PCP.   Noon CBG was 304 mg/dl however, patient ate breakfast at 11am.    We discussed importance of eliminating caloric beverages and watching her portion control and carbohydrates with meals.  Verbalizes understanding.  Will continue to follow while inpatient.  Thank you, Reche Dixon, MSN, [place] [facility] Coordinator Inpatient Diabetes Program [phone] (team pager from 8a-5p)

## 2022-09-22 NOTE — Consult Note (Signed)
PHARMACY CONSULT NOTE - ELECTROLYTES  Pharmacy Consult for Electrolyte Monitoring and Replacement   Recent Labs: Potassium (mmol/L)  Date Value  09/22/2022 4.1  05/31/2014 4.5   Magnesium (mg/dL)  Date Value  09/22/2022 2.1   Calcium (mg/dL)  Date Value  09/22/2022 8.7 (L)   Calcium, Total (mg/dL)  Date Value  05/31/2014 9.5   Albumin (g/dL)  Date Value  09/22/2022 1.9 (L)  09/22/2022 1.8 (L)  05/30/2014 3.7   Phosphorus (mg/dL)  Date Value  09/22/2022 3.2   Sodium (mmol/L)  Date Value  09/22/2022 137  05/31/2014 142   Corrected Ca: 10.5 mg/dL  Assessment  Katherine Carter is a 72 y.o. female presenting with pneumonia. PMH significant for COPD, GERD, HTN, DM. Pharmacy has been consulted to monitor and replace electrolytes.  Diet: Regular  Goal of Therapy: Electrolytes WNL  Plan:  Potassium: 4.4 >> 4.1, no replacement needed Magnesium: 2.5 >> 2.1, improved, no replacement needed Phosphorus: 3.2, no replacement needed Check renal function panel with AM labs  Thank you for allowing pharmacy to be a part of this patient's care.  Gretel Acre, PharmD PGY1 Pharmacy Resident 09/22/2022 1:42 PM

## 2022-09-22 NOTE — Consult Note (Signed)
Pharmacy Antibiotic Note  Katherine Carter is a 72 y.o. female admitted on 09/21/2022 with pneumonia. PMH significant for COPD, GERD, HTN, DM.  Pharmacy has been consulted for vancomycin and cefepime dosing.  Plan: Day 2 of antibiotics Discontinue vancomycin  Continue cefepime to 2 g IV Q12H Patient is also on azithromycin 500 mg IV Q24H Continue to monitor renal function and follow culture results  Height: 5\' 3"  (160 cm) Weight: 69.6 kg (153 lb 7 oz) IBW/kg (Calculated) : 52.4  Temp (24hrs), Avg:98.2 F (36.8 C), Min:98 F (36.7 C), Max:98.4 F (36.9 C)  Recent Labs  Lab 09/21/22 1341 09/21/22 1649 09/21/22 1903 09/21/22 2349 09/22/22 0500  WBC 16.3*  --   --   --  13.8*  CREATININE 1.88*  1.89*  --  1.54* 1.16* 1.06*  LATICACIDVEN 2.7* 3.9*  --  2.4* 1.8     Estimated Creatinine Clearance: 45.6 mL/min (A) (by C-G formula based on SCr of 1.06 mg/dL (H)).    Allergies  Allergen Reactions   Penicillins Hives and Other (See Comments)    Has patient had a PCN reaction causing immediate rash, facial/tongue/throat swelling, SOB or lightheadedness with hypotension: No Has patient had a PCN reaction causing severe rash involving mucus membranes or skin necrosis: No Has patient had a PCN reaction that required hospitalization: No Has patient had a PCN reaction occurring within the last 10 years: No If all of the above answers are "NO", then may proceed with Cephalosporin use.    Antimicrobials this admission: 1/18 Vancomycin x1 1/18 Azithromycin >>  1/18 Cefepime >>  Dose adjustments this admission: 1/98 Cefepime 2g IV Q24H >> Cefepime 2g IV Q12H  Microbiology results: 1/18 BCx: NG<24H 1/18 UCx: IP  1/18 MRSA PCR: negative  Thank you for allowing pharmacy to be a part of this patient's care.  Gretel Acre, PharmD PGY1 Pharmacy Resident 09/22/2022 7:38 AM

## 2022-09-22 NOTE — Progress Notes (Signed)
OT Cancellation Note  Patient Details Name: Katherine Carter MRN: [id number] DOB: 1951/03/01   Cancelled Treatment:    Reason Eval/Treat Not Completed: Medical issues which prohibited therapy. OT orders received, chart reviewed, pt noted to be pending imaging to r/o PE. Will hold at this time and continue to monitor. OT will initiate services once pt medically cleared to participate.   Shara Blazing, M.S., OTR/L 09/22/22, 1:05 PM

## 2022-09-22 NOTE — Progress Notes (Signed)
PT Cancellation Note  Patient Details Name: Katherine Carter MRN: [id number] DOB: April 12, 1951   Cancelled Treatment:    Reason Eval/Treat Not Completed: Other (comment). Consult received and chart reviewed. Pt pending imaging for assessment of PE. Contraindicated for mobility at this time. Will re-attempt another day.   Trachelle Low 09/22/2022, 1:07 PM Greggory Stallion, PT, DPT, GCS [phone]

## 2022-09-23 ENCOUNTER — Encounter: Payer: Self-pay | Admitting: Pulmonary Disease

## 2022-09-23 DIAGNOSIS — J9621 Acute and chronic respiratory failure with hypoxia: Secondary | ICD-10-CM | POA: Diagnosis not present

## 2022-09-23 LAB — GLUCOSE, CAPILLARY
Glucose-Capillary: 153 mg/dL — ABNORMAL HIGH (ref 70–99)
Glucose-Capillary: 174 mg/dL — ABNORMAL HIGH (ref 70–99)
Glucose-Capillary: 178 mg/dL — ABNORMAL HIGH (ref 70–99)
Glucose-Capillary: 197 mg/dL — ABNORMAL HIGH (ref 70–99)
Glucose-Capillary: 224 mg/dL — ABNORMAL HIGH (ref 70–99)
Glucose-Capillary: 230 mg/dL — ABNORMAL HIGH (ref 70–99)

## 2022-09-23 LAB — CBC
HCT: 30.6 % — ABNORMAL LOW (ref 36.0–46.0)
Hemoglobin: 9.5 g/dL — ABNORMAL LOW (ref 12.0–15.0)
MCH: 28.4 pg (ref 26.0–34.0)
MCHC: 31 g/dL (ref 30.0–36.0)
MCV: 91.6 fL (ref 80.0–100.0)
Platelets: 205 10*3/uL (ref 150–400)
RBC: 3.34 MIL/uL — ABNORMAL LOW (ref 3.87–5.11)
RDW: 15.2 % (ref 11.5–15.5)
WBC: 13.8 10*3/uL — ABNORMAL HIGH (ref 4.0–10.5)
nRBC: 0 % (ref 0.0–0.2)

## 2022-09-23 LAB — RENAL FUNCTION PANEL
Albumin: 1.8 g/dL — ABNORMAL LOW (ref 3.5–5.0)
Anion gap: 6 (ref 5–15)
BUN: 38 mg/dL — ABNORMAL HIGH (ref 8–23)
CO2: 21 mmol/L — ABNORMAL LOW (ref 22–32)
Calcium: 9.5 mg/dL (ref 8.9–10.3)
Chloride: 107 mmol/L (ref 98–111)
Creatinine, Ser: 1.02 mg/dL — ABNORMAL HIGH (ref 0.44–1.00)
GFR, Estimated: 59 mL/min — ABNORMAL LOW (ref >60)
Glucose, Bld: 187 mg/dL — ABNORMAL HIGH (ref 70–99)
Phosphorus: 2.6 mg/dL (ref 2.5–4.6)
Potassium: 4 mmol/L (ref 3.5–5.1)
Sodium: 134 mmol/L — ABNORMAL LOW (ref 135–145)

## 2022-09-23 LAB — BRAIN NATRIURETIC PEPTIDE: B Natriuretic Peptide: 128.1 pg/mL — ABNORMAL HIGH (ref 0.0–100.0)

## 2022-09-23 LAB — TROPONIN I (HIGH SENSITIVITY): Troponin I (High Sensitivity): 34 ng/L — ABNORMAL HIGH (ref <18)

## 2022-09-23 LAB — PROCALCITONIN: Procalcitonin: 7.84 ng/mL

## 2022-09-23 MED ORDER — LEVALBUTEROL HCL 1.25 MG/0.5ML IN NEBU
1.2500 mg | INHALATION_SOLUTION | RESPIRATORY_TRACT | Status: AC
  Administered 2022-09-23: 1.25 mg via RESPIRATORY_TRACT
  Filled 2022-09-23: qty 0.5

## 2022-09-23 MED ORDER — INSULIN ASPART 100 UNIT/ML IJ SOLN
0.0000 [IU] | Freq: Three times a day (TID) | INTRAMUSCULAR | Status: DC
  Administered 2022-09-23: 7 [IU] via SUBCUTANEOUS
  Administered 2022-09-23: 4 [IU] via SUBCUTANEOUS
  Administered 2022-09-24: 7 [IU] via SUBCUTANEOUS
  Administered 2022-09-24: 3 [IU] via SUBCUTANEOUS
  Administered 2022-09-25: 4 [IU] via SUBCUTANEOUS
  Filled 2022-09-23 (×5): qty 1

## 2022-09-23 MED ORDER — FUROSEMIDE 10 MG/ML IJ SOLN
INTRAMUSCULAR | Status: AC
  Filled 2022-09-23: qty 4

## 2022-09-23 MED ORDER — HYDRALAZINE HCL 20 MG/ML IJ SOLN
10.0000 mg | INTRAMUSCULAR | Status: DC | PRN
  Administered 2022-09-23: 10 mg via INTRAVENOUS
  Filled 2022-09-23: qty 1

## 2022-09-23 MED ORDER — AMLODIPINE BESYLATE 5 MG PO TABS
5.0000 mg | ORAL_TABLET | Freq: Every day | ORAL | Status: DC
  Administered 2022-09-23 – 2022-10-05 (×8): 5 mg via ORAL
  Filled 2022-09-23 (×8): qty 1

## 2022-09-23 MED ORDER — NICARDIPINE HCL IN NACL 20-0.86 MG/200ML-% IV SOLN
3.0000 mg/h | INTRAVENOUS | Status: DC
  Administered 2022-09-23: 5 mg/h via INTRAVENOUS
  Administered 2022-09-24: 8 mg/h via INTRAVENOUS
  Administered 2022-09-24: 5 mg/h via INTRAVENOUS
  Filled 2022-09-23 (×4): qty 200

## 2022-09-23 MED ORDER — IPRATROPIUM-ALBUTEROL 0.5-2.5 (3) MG/3ML IN SOLN
3.0000 mL | Freq: Four times a day (QID) | RESPIRATORY_TRACT | Status: DC
  Administered 2022-09-23 – 2022-09-30 (×28): 3 mL via RESPIRATORY_TRACT
  Filled 2022-09-23 (×28): qty 3

## 2022-09-23 MED ORDER — FUROSEMIDE 10 MG/ML IJ SOLN
40.0000 mg | INTRAMUSCULAR | Status: AC
  Administered 2022-09-23: 40 mg via INTRAVENOUS

## 2022-09-23 MED ORDER — SODIUM CHLORIDE 0.9 % IV SOLN
INTRAVENOUS | Status: DC | PRN
  Administered 2022-09-26: 10 mL/h via INTRAVENOUS

## 2022-09-23 MED ORDER — ENOXAPARIN SODIUM 40 MG/0.4ML IJ SOSY
40.0000 mg | PREFILLED_SYRINGE | Freq: Every day | INTRAMUSCULAR | Status: DC
  Administered 2022-09-23 – 2022-10-04 (×12): 40 mg via SUBCUTANEOUS
  Filled 2022-09-23 (×12): qty 0.4

## 2022-09-23 MED ORDER — INSULIN ASPART 100 UNIT/ML IJ SOLN: 0.0000 [IU] | Freq: Every day | INTRAMUSCULAR | Status: DC

## 2022-09-23 MED ORDER — METHYLPREDNISOLONE SODIUM SUCC 40 MG IJ SOLR
40.0000 mg | INTRAMUSCULAR | Status: AC
  Administered 2022-09-23: 40 mg via INTRAVENOUS

## 2022-09-23 NOTE — Progress Notes (Signed)
NAME:  Katherine Carter, MRN:  [id number], DOB:  August 31, 1951, LOS: 2 ADMISSION DATE:  09/21/2022, CONSULTATION DATE:  09/21/2021 REFERRING MD:  Dr. Starleen Blue, CHIEF COMPLAINT:  Weakness s/p falls, shortness of breath, cough   Brief Pt Description / Synopsis:  72 y.o. female, DNR/DNI, with PMHx significant for COPD on 4L supplemental O2 admitted with Acute on Chronic Hypoxic Respiratory Failure and severe sepsis in the setting of suspected Multifocal Pneumonia and AECOPD requiring BiPAP.  Also found to have AKI and concern for mild DKA.  History of Present Illness:  Katherine Carter is a 72 year old female with a past medical history significant for chronic hypoxic respiratory failure requiring 4 L supplemental oxygen due to COPD, diabetes mellitus, hypertension, GERD who presents to [facility] ED on 09/21/2022 due to generalized weakness status post multiple falls, dyspnea, and cough.  Patient is currently on BiPAP with increased work of breathing, therefore history is obtained from the patient's husband at bedside along with chart review.  Her husband reports that last Saturday she began to have cough and shortness of breath, which have worsened since then.  She is also had generalized weakness status post 2 falls, poor p.o. intake, and dysuria.  He denies any knowledge that she has had any chest pain, dizziness, abdominal pain, nausea, vomiting, diarrhea, fever, chills.  ED Course: Initial Vital Signs: Temperature 98 Fahrenheit orally, pulse 133, respiratory rate 22, blood pressure 109/75, SpO2 92% Significant Labs: Sodium 127, potassium 5.3, chloride 93, bicarb 20, anion gap 14, albumin 2.7, glucose 10/10/1987, BUN 42, creatinine 1.89, AST 53, ALT 55, total bili 1.4, BNP 110, high-sensitivity troponin 31, WBC 16.3 with neutrophilia, lactic acid 2.7, INR 1.3, PT 15.6 VBG on BiPAP: pH 7.17/pCO2 58/pO2/bicarb 21.2 EKG EKG shows sinus tach, RAD, somewhat peaked T waves Imaging Chest  X-ray>>IMPRESSION: 1. New patchy right greater than left perihilar opacities and pulmonary venous congestion. Differential considerations include asymmetric edema versus infection or aspiration. 2.  Aortic Atherosclerosis (ICD10-I70.0). CT head>>IMPRESSION: 1. No acute intracranial abnormality. 2. Chronic small vessel ischemic disease. CT cervical spine>>IMPRESSION: 1. Mildly motion degraded exam. 2. No evidence of acute fracture to the cervical spine. 3. 2-3 mm grade 1 anterolisthesis at C4-C5. 4. Cervical spondylosis, as described. Multilevel spinal canal stenosis. Most notably, a central disc protrusion contributes to at least moderate spinal canal stenosis at C3-C4. Multilevel bony neural foraminal narrowing also present within the cervical spine. A cervical spine MRI may be obtained for further evaluation, as clinically warranted. 5.  Aortic Atherosclerosis (ICD10-I70.0). Medications Administered: 3 L IV fluid boluses, IV's azithromycin, cefepime, vancomycin, 60 mg IV Solu-Medrol, multiple DuoNebs  On arrival to the ED she was tachycardic in the 150s and tachypneic, but initially saturating okay on her baseline 4 L.  She was noted to be pursed lip breathing with expiratory wheezing. Given her work of breathing, patient was placed on BiPAP.  Patient is very adamant that she would not want to be intubated or have CPR or ACLS performed.  PCCM asked to admit for further workup and treatment.  Please see "significant hospital events" section below for full detailed hospital course.  Pertinent  Medical History   Past Medical History:  Diagnosis Date   COPD (chronic obstructive pulmonary disease) ([place])    Diabetes mellitus without complication (HCC)    GERD (gastroesophageal reflux disease)    Hypertension     Micro Data:  1/18: SARS-CoV-2/RSV/influenza PCR>> negative 1/18: Blood culture x 2>> NGTD 1/18: Urine>>less than 10,000 colonies/ml insignificant growth  1/18: Respiratory  viral panel>> negative 1/18: Strep pneumo and Legionella urinary antigens>> negative 1/18: Mycoplasma>> 1/18: MRSA PCR>>negative  Antimicrobials:  Azithromycin 1/18>> Cefepime 1/18>> Vancomycin 1/18>>1/19  Significant Hospital Events: Including procedures, antibiotic start and stop dates in addition to other pertinent events   1/18: Presents to [facility] ED, with severe respiratory distress requiring BiPAP.  Patient adamant she would want to be DNR/DNI.  PCCM asked to admit 1/19: Weaned off BiPAP to baseline 4L [place].  DKI resolved, AKI improving.  PT/OT consulted.  Vancomycin d/c as MRSA PCR is negative 1/19: CTA Chest>>No evidence of significant pulmonary embolus. Dense        consolidation in the right lung likely representing pneumonia. Emphysematous        changes in the lungs.  Aortic atherosclerosis. 1/20: Remains off Bipap tolerating 4L via nasal canula; will transfer service to [facility]   Interim History / Subjective:  As outlined above   Objective   Blood pressure 104/63, pulse 98, temperature 98 F (36.7 C), temperature source Oral, resp. rate 19, height 5\' 3"  (1.6 m), weight 74 kg, SpO2 97 %.        Intake/Output Summary (Last 24 hours) at 09/23/2022 0754 Last data filed at 09/23/2022 0500 Gross per 24 hour  Intake 603.1 ml  Output 1400 ml  Net -796.9 ml   Filed Weights   09/21/22 1703 09/21/22 1815 09/23/22 0500  Weight: 69.4 kg 69.6 kg 74 kg    Examination: General: Acute on chronically ill-appearing female, laying in bed, on 4L [place], in NAD HENT: Atraumatic, normocephalic, neck supple, no JVDLungs: Faint rhonchi throughout, even, non labored  Cardiovascular: Sinus tachycardia, s1-s2, no m/r/g, 2+ radial/2+ distal pulses, no edema  Abdomen: +BS x4, soft, non tender, non distended  Extremities: Normal bulk and tone, generalized weakness, no deformities Neuro: Awake and alert, oriented x 3, follows commands no focal deficits GU: External catheter in place draining yellow  urine  Resolved Hospital Problem list   Mild DKA   Assessment & Plan:   #Acute on Chronic Hypoxic Respiratory Failure in the setting of AECOPD & suspected community-acquired pneumonia PMHx: COPD on 4L supplemental O2 at baseline - Supplemental O2 as needed to maintain O2 sats 88 to 92% - Bipap as need for dyspnea and/or hypercapnia  - Follow intermittent Chest X-ray & ABG as needed - Bronchodilators & Pulmicort nebs - IV Steroids wean as tolerated  - ABX as above - Diuresis as BP and renal function permits - Pulmonary toilet as able - Prn morphine for air hunger  #Severe Sepsis due to suspected community-acquired Pneumonia (met SIRS Criteria at admission: HR >90, RR >20, WBC 16.3) - Trend WBC and monitor fever curve  - Trend PCT  - Continue empiric azithromycin and cefepime pending cultures & sensitivities  #Acute Kidney Injury ~ IMPROVING #Hypertonic Hyponatremia in setting of severe hyperglycemia (corrected Na for glucose is Na 135) ~ RESOLVED #Mild Hyperkalemia ~ RESOLVED #Anion Gap Metabolic Acidosis ~ RESOLVED - Trend BMP  - Replace electrolytes as indicated  - Monitor UOP - Avoid nephrotoxic medications   #Diabetes Mellitus Type II -CBG's q4h; Target range of 140 to 180 -SSI and scheduled levemir  -Follow ICU Hypo/Hyperglycemia protocol  Best Practice (right click and "Reselect all SmartList Selections" daily)   Diet/type: Heart healthy, low carb DVT prophylaxis: prophylactic heparin  GI prophylaxis: PPI Lines: Right femoral CVC (yes due to limited Peripheral access) Foley:  N/A Code Status:  DNR Last date of multidisciplinary goals of care discussion [  09/23/2021]  1/19: Pt updated regarding plan of care and all questions answered  Labs   CBC: Recent Labs  Lab 09/21/22 1341 09/22/22 0500 09/23/22 0506  WBC 16.3* 13.8* 13.8*  NEUTROABS 14.7*  --   --   HGB 13.2 9.5* 9.5*  HCT 41.7 30.1* 30.6*  MCV 91.0 91.2 91.6  PLT 255 188 205    Basic Metabolic  Panel: Recent Labs  Lab 09/21/22 1341 09/21/22 1903 09/21/22 2349 09/22/22 0500 09/23/22 0506  NA 127*  127* 133* 137 137 134*  K 5.4*  5.3* 4.7 4.4 4.1 4.0  CL 94*  93* 106 112* 111 107  CO2 19*  20* 18* 18* 24 21*  GLUCOSE 574*  589* 403* 191* 194* 187*  BUN 41*  42* 34* 31* 28* 38*  CREATININE 1.88*  1.89* 1.54* 1.16* 1.06* 1.02*  CALCIUM 9.3  9.5 8.3* 8.7* 8.7* 9.5  MG 2.5*  --   --  2.1  --   PHOS  --   --   --  3.2 2.6   GFR: Estimated Creatinine Clearance: 48.7 mL/min (A) (by C-G formula based on SCr of 1.02 mg/dL (H)). Recent Labs  Lab 09/21/22 1341 09/21/22 1649 09/21/22 2349 09/22/22 0500 09/23/22 0506  PROCALCITON 13.55  --   --  11.94 7.84  WBC 16.3*  --   --  13.8* 13.8*  LATICACIDVEN 2.7* 3.9* 2.4* 1.8  --     Liver Function Tests: Recent Labs  Lab 09/21/22 1341 09/22/22 0500 09/23/22 0506  AST 53* 34  --   ALT 55* 36  --   ALKPHOS 120 84  --   BILITOT 1.4* 0.5  --   PROT 9.1* 6.8  --   ALBUMIN 2.7* 1.8*  1.9* 1.8*   No results for input(s): "LIPASE", "AMYLASE" in the last 168 hours. No results for input(s): "AMMONIA" in the last 168 hours.  ABG    Component Value Date/Time   PHART 7.33 (L) 09/02/2021 2117   PCO2ART 51 (H) 09/02/2021 2117   PO2ART 249 (H) 09/02/2021 2117   HCO3 19.2 (L) 09/21/2022 2349   ACIDBASEDEF 4.5 (H) 09/21/2022 2349   O2SAT 99.8 09/21/2022 2349     Coagulation Profile: Recent Labs  Lab 09/21/22 1341  INR 1.3*    Cardiac Enzymes: Recent Labs  Lab 09/21/22 1341  CKTOTAL 68    HbA1C: Hgb A1c MFr Bld  Date/Time Value Ref Range Status  09/22/2022 05:00 AM 9.0 (H) 4.8 - 5.6 % Final    Comment:    (NOTE) Pre diabetes:          5.7%-6.4%  Diabetes:              >6.4%  Glycemic control for   <7.0% adults with diabetes   06/04/2022 03:37 PM 8.5 (H) 4.8 - 5.6 % Final    Comment:    (NOTE) Pre diabetes:          5.7%-6.4%  Diabetes:              >6.4%  Glycemic control for   <7.0% adults  with diabetes     CBG: Recent Labs  Lab 09/22/22 1535 09/22/22 1955 09/22/22 2345 09/23/22 0343 09/23/22 0749  GLUCAP 180* 241* 230* 224* 174*    Review of Systems:   Positives in BOLD: Gen: Denies fever, chills, weight change, fatigue, night sweats HEENT: Denies blurred vision, double vision, hearing loss, tinnitus, sinus congestion, rhinorrhea, sore throat, neck stiffness, dysphagia PULM: Denies shortness  of breath, cough, sputum production, hemoptysis, wheezing CV: Denies chest pain, edema, orthopnea, paroxysmal nocturnal dyspnea, palpitations GI: Denies abdominal pain, nausea, vomiting, diarrhea, hematochezia, melena, constipation, change in bowel habits GU: Denies dysuria, hematuria, polyuria, oliguria, urethral discharge Endocrine: Denies hot or cold intolerance, polyuria, polyphagia or appetite change Derm: Denies rash, dry skin, scaling or peeling skin change Heme: Denies easy bruising, bleeding, bleeding gums Neuro: Denies headache, numbness, weakness, slurred speech, loss of memory or consciousness  Past Medical History:  She,  has a past medical history of COPD (chronic obstructive pulmonary disease) ([place]), Diabetes mellitus without complication ([place]), GERD (gastroesophageal reflux disease), and Hypertension.   Surgical History:   Past Surgical History:  Procedure Laterality Date   ABDOMINAL HYSTERECTOMY     COLONOSCOPY WITH PROPOFOL N/A 01/23/2017   Procedure: COLONOSCOPY WITH PROPOFOL;  Surgeon: Lollie Sails, MD;  Location[facility] ENDOSCOPY;  Service: Endoscopy;  Laterality: N/A;   FOOT SURGERY       Social History:   reports that she quit smoking about 8 years ago. Her smoking use included cigarettes. She has a 12.50 pack-year smoking history. She has never used smokeless tobacco. She reports that she does not drink alcohol and does not use drugs.   Family History:  Her family history includes CAD in her father and mother. There is no history of Breast  cancer.   Allergies Allergies  Allergen Reactions   Penicillins Hives and Other (See Comments)    Has patient had a PCN reaction causing immediate rash, facial/tongue/throat swelling, SOB or lightheadedness with hypotension: No Has patient had a PCN reaction causing severe rash involving mucus membranes or skin necrosis: No Has patient had a PCN reaction that required hospitalization: No Has patient had a PCN reaction occurring within the last 10 years: No If all of the above answers are "NO", then may proceed with Cephalosporin use.     Home Medications  Prior to Admission medications   Medication Sig Start Date End Date Taking? Authorizing Provider  celecoxib (CELEBREX) 100 MG capsule Take 100 mg by mouth daily. 08/10/22  Yes [provider]  fluticasone (FLONASE) 50 MCG/ACT nasal spray Place 1 spray into both nostrils daily. 09/06/22  Yes [provider]  latanoprost (XALATAN) 0.005 % ophthalmic solution Place 1 drop into both eyes 2 (two) times daily. 08/04/22  Yes [provider]  losartan (COZAAR) 100 MG tablet Take 100 mg by mouth daily. 08/24/22  Yes [provider]  ondansetron (ZOFRAN) 4 MG tablet Take 4 mg by mouth every 8 (eight) hours as needed for nausea or vomiting. 07/04/22  Yes [provider]  TRULICITY 7.84 ON/6.2XB SOPN Inject 0.75 mg into the skin once a week. 06/28/22  Yes [provider]  albuterol (VENTOLIN HFA) 108 (90 Base) MCG/ACT inhaler Inhale 2 puffs into the lungs every 4 (four) hours as needed for wheezing or shortness of breath.    [provider]  amLODipine (NORVASC) 5 MG tablet Take 1 tablet (5 mg total) by mouth daily. 09/05/21   Hosie Poisson, MD  aspirin EC 81 MG tablet Take 81 mg by mouth daily. 03/15/22   [provider]  atorvastatin (LIPITOR) 40 MG tablet Take 1 tablet (40 mg total) by mouth every evening. 09/05/21   Hosie Poisson, MD  benzonatate (TESSALON) 100 MG capsule Take 2  capsules (200 mg total) by mouth every 8 (eight) hours. 05/28/22   Margarette Canada, NP  budesonide-formoterol [facility]) 160-4.5 MCG/ACT inhaler Inhale 2 puffs into the  lungs 2 (two) times daily.    [provider]  Dextromethorphan-guaiFENesin (MUCINEX DM) 30-600 MG TB12 SMARTSIG:1-2 Tablet(s) By Mouth Every 12 Hours PRN 04/28/22   [provider]  guaiFENesin-codeine (ROBITUSSIN AC) 100-10 MG/5ML syrup Take 5 mLs by mouth 3 (three) times daily as needed for cough. 05/28/22   Becky Augusta, NP  ipratropium (ATROVENT) 0.06 % nasal spray Place 2 sprays into both nostrils 4 (four) times daily. 05/28/22   Becky [place]a, NP  ipratropium-albuterol (DUONEB) 0.5-2.5 (3) MG/3ML SOLN Take 3 mLs by nebulization every 4 (four) hours as needed. Patient taking differently: Take 3 mLs by nebulization every 4 (four) hours as needed. Last dose: 730 am. 09/05/21   Kathlen Mody, MD  JARDIANCE 10 MG TABS tablet Take 10 mg by mouth daily. 09/07/21   [provider]  metFORMIN (GLUCOPHAGE-XR) 500 MG 24 hr tablet Take 500 mg by mouth daily with breakfast.    [provider]  methocarbamol (ROBAXIN) 500 MG tablet Take 500 mg by mouth every 6 (six) hours as needed for muscle spasms. 06/21/21   [provider]  MIRALAX 17 g packet Take 17 g by mouth daily as needed for mild constipation. 06/23/21   [provider]  ondansetron (ZOFRAN-ODT) 4 MG disintegrating tablet Take 1 tablet (4 mg total) by mouth every 8 (eight) hours as needed. 09/01/22   Brimage, Vondra, DO  OZEMPIC, 0.25 OR 0.5 MG/DOSE, 2 MG/3ML SOPN Inject into the skin. 04/26/22   [provider]  pantoprazole (PROTONIX) 40 MG tablet Take 1 tablet (40 mg total) by mouth daily. 06/11/21   Tommie Sams, DO  Respiratory Therapy Supplies (ADULT MASK LARGE) MISC See admin instructions. use with inhaler 10/17/21   [provider]  Spacer/Aero-Holding Chambers (AEROCHAMBER PLUS) inhaler Use with inhaler 10/15/21    Domenick Gong, MD  tiotropium (SPIRIVA HANDIHALER) 18 MCG inhalation capsule Place 1 capsule (18 mcg total) into inhaler and inhale daily. Patient not taking: Reported on 06/05/2022 09/05/21   Kathlen Mody, MD  VICTOZA 18 MG/3ML SOPN     [provider]  VOLTAREN ARTHRITIS PAIN 1 % GEL Apply 2 g topically 4 (four) times daily. 03/22/22   [provider]  XOPENEX HFA 45 MCG/ACT inhaler Inhale 1 puff into the lungs 3 (three) times daily. 11/02/21   [provider]     Critical Care time: 35 minutes     Zada Girt, AGNP  Pulmonary/Critical Care Pager [phone] (please enter 7 digits) PCCM Consult Pager [phone] (please enter 7 digits)

## 2022-09-23 NOTE — Plan of Care (Signed)
Patient remains in SDU. Patient remains on continuous BiPAP. Cardene infusion initiated by this RN per order for SBP > 160 mmHg. CVC in place, and central line bundle initiated. Fall risk precautions in place.   Problem: Education: Goal: Ability to describe self-care measures that may prevent or decrease complications (Diabetes Survival Skills Education) will improve Outcome: Not Progressing Goal: Individualized Educational Video(s) Outcome: Not Progressing   Problem: Coping: Goal: Ability to adjust to condition or change in health will improve Outcome: Not Progressing   Problem: Fluid Volume: Goal: Ability to maintain a balanced intake and output will improve Outcome: Not Progressing   Problem: Health Behavior/Discharge Planning: Goal: Ability to identify and utilize available resources and services will improve Outcome: Not Progressing Goal: Ability to manage health-related needs will improve Outcome: Not Progressing   Problem: Metabolic: Goal: Ability to maintain appropriate glucose levels will improve Outcome: Not Progressing   Problem: Nutritional: Goal: Maintenance of adequate nutrition will improve Outcome: Not Progressing Goal: Progress toward achieving an optimal weight will improve Outcome: Not Progressing   Problem: Skin Integrity: Goal: Risk for impaired skin integrity will decrease Outcome: Not Progressing   Problem: Tissue Perfusion: Goal: Adequacy of tissue perfusion will improve Outcome: Not Progressing   Problem: Education: Goal: Knowledge of General Education information will improve Description: Including pain rating scale, medication(s)/side effects and non-pharmacologic comfort measures Outcome: Not Progressing   Problem: Health Behavior/Discharge Planning: Goal: Ability to manage health-related needs will improve Outcome: Not Progressing   Problem: Clinical Measurements: Goal: Ability to maintain clinical measurements within normal limits will  improve Outcome: Not Progressing Goal: Will remain free from infection Outcome: Not Progressing Goal: Diagnostic test results will improve Outcome: Not Progressing Goal: Respiratory complications will improve Outcome: Not Progressing Goal: Cardiovascular complication will be avoided Outcome: Not Progressing   Problem: Activity: Goal: Risk for activity intolerance will decrease Outcome: Not Progressing   Problem: Nutrition: Goal: Adequate nutrition will be maintained Outcome: Not Progressing   Problem: Coping: Goal: Level of anxiety will decrease Outcome: Not Progressing   Problem: Elimination: Goal: Will not experience complications related to bowel motility Outcome: Not Progressing Goal: Will not experience complications related to urinary retention Outcome: Not Progressing   Problem: Pain Managment: Goal: General experience of comfort will improve Outcome: Not Progressing   Problem: Safety: Goal: Ability to remain free from injury will improve Outcome: Not Progressing   Problem: Skin Integrity: Goal: Risk for impaired skin integrity will decrease Outcome: Not Progressing   Problem: Education: Goal: Ability to describe self-care measures that may prevent or decrease complications (Diabetes Survival Skills Education) will improve Outcome: Not Progressing Goal: Individualized Educational Video(s) Outcome: Not Progressing   Problem: Cardiac: Goal: Ability to maintain an adequate cardiac output will improve Outcome: Not Progressing   Problem: Health Behavior/Discharge Planning: Goal: Ability to identify and utilize available resources and services will improve Outcome: Not Progressing Goal: Ability to manage health-related needs will improve Outcome: Not Progressing   Problem: Fluid Volume: Goal: Ability to achieve a balanced intake and output will improve Outcome: Not Progressing   Problem: Metabolic: Goal: Ability to maintain appropriate glucose levels  will improve Outcome: Not Progressing   Problem: Nutritional: Goal: Maintenance of adequate nutrition will improve Outcome: Not Progressing Goal: Maintenance of adequate weight for body size and type will improve Outcome: Not Progressing   Problem: Respiratory: Goal: Will regain and/or maintain adequate ventilation Outcome: Not Progressing   Problem: Urinary Elimination: Goal: Ability to achieve and maintain adequate renal perfusion and functioning will  improve Outcome: Not Progressing

## 2022-09-23 NOTE — Consult Note (Signed)
PHARMACY CONSULT NOTE - ELECTROLYTES  Pharmacy Consult for Electrolyte Monitoring and Replacement   Recent Labs: Potassium (mmol/L)  Date Value  09/23/2022 4.0  05/31/2014 4.5   Magnesium (mg/dL)  Date Value  09/22/2022 2.1   Calcium (mg/dL)  Date Value  09/23/2022 9.5   Calcium, Total (mg/dL)  Date Value  05/31/2014 9.5   Albumin (g/dL)  Date Value  09/23/2022 1.8 (L)  05/30/2014 3.7   Phosphorus (mg/dL)  Date Value  09/23/2022 2.6   Sodium (mmol/L)  Date Value  09/23/2022 134 (L)  05/31/2014 142   Assessment  Katherine Carter is a 72 y.o. female presenting with pneumonia. PMH significant for COPD, GERD, HTN, DM. Pharmacy has been consulted to monitor and replace electrolytes.  Diet: Regular  Goal of Therapy: Electrolytes WNL  Plan:  --No electrolyte replacement indicated at this time --Labs have been stable. Defer ordering of labs to primary team  Thank you for allowing pharmacy to be a part of this patient's care.  Benita Gutter 09/23/2022 7:15 AM

## 2022-09-23 NOTE — Progress Notes (Signed)
Progress Note  Pt with sudden onset of acute respiratory failure with tachypnea and accessory muscle use requiring Bipap again.  Pt tachycardic and hypertensive bp 200's/100's.  EKG revealed sinus tachycardia.  Pt with faint wheezing throughout.  Administered xopenex; 40 mg iv solumedrol; and 40 mg of iv lasix for possible flash pulmonary edema secondary to hypertensive urgency.  Pts heart rate decreased to the 130's, however she remains hypertensive bp 167/108.  Nicardipine gtt ordered for sbp >160.  Pt also received 2 mg of iv morphine for air hunger.  Respiratory status slowly improving.  Pts husband at bedside and all questions were answered.  Will continue to monitor and assess pt.    Donell Beers, [place] Pager [phone] (please enter 7 digits) PCCM Consult Pager [phone] (please enter 7 digits)

## 2022-09-23 NOTE — Evaluation (Signed)
Physical Therapy Evaluation Patient Details Name: Katherine Carter MRN: [id number] DOB: June 13, 1951 Today's Date: 09/23/2022  History of Present Illness  Pt admitted for Acute on chronic respiratory failure with hypoxia along with sepsis. Pt with complaints of weakness, falls, and cough. HIstory includes COPD on 4L of O2 at baseline, DM, HTN, and GERD.  Clinical Impression  PT/OT co-evaluation this date. Pt is a pleasant 72 year old female who was admitted for acute on chronic respiratory failure. Pt performs bed mobility with cga, transfers with min assist, and ambulation with cga and RW. +2 for safety and line management.  Pt demonstrates deficits with strength/endurance/mobility. Would benefit from skilled PT to address above deficits and promote optimal return to PLOF. Recommend transition to [facility] [place] upon discharge from acute hospitalization.      Recommendations for follow up therapy are one component of a multi-disciplinary discharge planning process, led by the attending physician.  Recommendations may be updated based on patient status, additional functional criteria and insurance authorization.  Follow Up Recommendations Home health PT      Assistance Recommended at Discharge Intermittent Supervision/Assistance  Patient can return home with the following  A little help with walking and/or transfers;A little help with bathing/dressing/bathroom;Assist for transportation;Help with stairs or ramp for entrance    Equipment Recommendations Rolling walker (2 wheels)  Recommendations for Other Services       Functional Status Assessment Patient has had a recent decline in their functional status and demonstrates the ability to make significant improvements in function in a reasonable and predictable amount of time.     Precautions / Restrictions Precautions Precautions: Fall Restrictions Weight Bearing Restrictions: No      Mobility  Bed Mobility Overal bed mobility: Needs  Assistance Bed Mobility: Supine to Sit     Supine to sit: Min guard     General bed mobility comments: follows commands, slightly impulsive and tries to stand prior to therapist cues.    Transfers Overall transfer level: Needs assistance Equipment used: Rolling walker (2 wheels) Transfers: Sit to/from Stand Sit to Stand: Min assist           General transfer comment: cues for sequencing. Vitals elevated with exertion. +2 for safety and poor endurance    Ambulation/Gait Ambulation/Gait assistance: Min guard, +2 safety/equipment Gait Distance (Feet): 10 Feet Assistive device: Rolling walker (2 wheels) Gait Pattern/deviations: Step-to pattern       General Gait Details: ambulated to door and back. Needs cues for sequencing and lines/leads. O2 sats decreased to 87% on 4L and HR elevated to 133bpm with exertion. Quick fatigue and some nausea noted.  Stairs            Wheelchair Mobility    Modified Rankin (Stroke Patients Only)       Balance Overall balance assessment: Needs assistance, History of Falls Sitting-balance support: Feet supported, Bilateral upper extremity supported Sitting balance-Leahy Scale: Good     Standing balance support: Bilateral upper extremity supported Standing balance-Leahy Scale: Fair                               Pertinent Vitals/Pain Pain Assessment Pain Assessment: No/denies pain    Home Living Family/patient expects to be discharged to:: Private residence Living Arrangements: Spouse/significant other Available Help at Discharge: Family Type of Home: House Home Access: Level entry       Home Layout: One level Home Equipment: Cane - single point  Prior Function Prior Level of Function : Independent/Modified Independent             Mobility Comments: primarily household ambulatory, however does endorce getting out into community and uses [facility] for mobility. Reports only 1 fall in last 6 months        Hand Dominance        Extremity/Trunk Assessment   Upper Extremity Assessment Upper Extremity Assessment: Generalized weakness (B UE grossly 4/5)    Lower Extremity Assessment Lower Extremity Assessment: Generalized weakness (B LE grossly 4-/5)       Communication   Communication: No difficulties  Cognition Arousal/Alertness: Awake/alert Behavior During Therapy: WFL for tasks assessed/performed Overall Cognitive Status: Within Functional Limits for tasks assessed                                          General Comments      Exercises     Assessment/Plan    PT Assessment Patient needs continued PT services  PT Problem List Decreased strength;Decreased activity tolerance;Decreased balance;Decreased mobility;Decreased safety awareness;Decreased knowledge of use of DME;Cardiopulmonary status limiting activity       PT Treatment Interventions Gait training;DME instruction;Therapeutic exercise;Balance training    PT Goals (Current goals can be found in the Care Plan section)  Acute Rehab PT Goals Patient Stated Goal: to get her strength back PT Goal Formulation: With patient Time For Goal Achievement: 10/07/22 Potential to Achieve Goals: Good    Frequency Min 2X/week     Co-evaluation PT/OT/SLP Co-Evaluation/Treatment: Yes Reason for Co-Treatment: Complexity of the patient's impairments (multi-system involvement);Necessary to address cognition/behavior during functional activity;For patient/therapist safety PT goals addressed during session: Mobility/safety with mobility OT goals addressed during session: ADL's and self-care       AM-PAC PT "6 Clicks" Mobility  Outcome Measure Help needed turning from your back to your side while in a flat bed without using bedrails?: A Little Help needed moving from lying on your back to sitting on the side of a flat bed without using bedrails?: A Little Help needed moving to and from a bed to a chair  (including a wheelchair)?: A Little Help needed standing up from a chair using your arms (e.g., wheelchair or bedside chair)?: A Little Help needed to walk in hospital room?: A Little Help needed climbing 3-5 steps with a railing? : A Lot 6 Click Score: 17    End of Session Equipment Utilized During Treatment: Gait belt;Oxygen Activity Tolerance: Patient tolerated treatment well Patient left: in bed;with family/visitor present Nurse Communication: Mobility status PT Visit Diagnosis: Unsteadiness on feet (R26.81);Muscle weakness (generalized) (M62.81);History of falling (Z91.81);Difficulty in walking, not elsewhere classified (R26.2)    Time: 5956-3875 PT Time Calculation (min) (ACUTE ONLY): 27 min   Charges:   PT Evaluation $PT Eval Low Complexity: 1 Low PT Treatments $Gait Training: 8-22 mins        Greggory Stallion, PT, DPT, GCS [phone]   Mickayla Trouten 09/23/2022, 4:38 PM

## 2022-09-23 NOTE — Evaluation (Signed)
Occupational Therapy Evaluation Patient Details Name: Katherine Carter MRN: [id number] DOB: 1950-10-31 Today's Date: 09/23/2022   History of Present Illness Pt admitted for Acute on chronic respiratory failure with hypoxia along with sepsis. Pt with complaints of weakness, falls, and cough. HIstory includes COPD on 4L of O2 at baseline, DM, HTN, and GERD.   Clinical Impression   Patient/significant other agreeable to OT/PT co-treatment to maximize safety and participation. Pt presenting with decreased independence in self care, balance, functional mobility/transfers, and endurance. PTA pt was independent for ADLs, Mod I for IADLs, and Mod I for functional mobility using SPC. Pt currently functioning at [place] guard for bed mobility, Mod A for LB dressing, Min A to stand from EOB, and Min guard +2 (safety/equipment) for functional mobility at room level using RW. On 4L O2 via  t/o which is baseline. Pt will benefit from acute OT to increase overall independence in the areas of ADLs and functional mobility in order to safely discharge home. Pt could benefit from [facility] following D/C to decrease falls risk, improve balance, and maximize independence in self-care within own home environment.    Recommendations for follow up therapy are one component of a multi-disciplinary discharge planning process, led by the attending physician.  Recommendations may be updated based on patient status, additional functional criteria and insurance authorization.   Follow Up Recommendations  Home health OT     Assistance Recommended at Discharge Intermittent Supervision/Assistance  Patient can return home with the following A little help with walking and/or transfers;A little help with bathing/dressing/bathroom;Assistance with cooking/housework;Assist for transportation;Help with stairs or ramp for entrance    Functional Status Assessment  Patient has had a recent decline in their functional status and  demonstrates the ability to make significant improvements in function in a reasonable and predictable amount of time.  Equipment Recommendations  BSC/3in1    Recommendations for Other Services       Precautions / Restrictions Precautions Precautions: Fall Restrictions Weight Bearing Restrictions: No      Mobility Bed Mobility Overal bed mobility: Needs Assistance Bed Mobility: Supine to Sit, Sit to Supine     Supine to sit: Min guard, HOB elevated Sit to supine: Min guard        Transfers Overall transfer level: Needs assistance Equipment used: Rolling walker (2 wheels) Transfers: Sit to/from Stand Sit to Stand: Min assist                  Balance Overall balance assessment: Needs assistance, History of Falls Sitting-balance support: Feet supported, Bilateral upper extremity supported Sitting balance-Leahy Scale: Good     Standing balance support: Bilateral upper extremity supported Standing balance-Leahy Scale: Fair                             ADL either performed or assessed with clinical judgement   ADL Overall ADL's : Needs assistance/impaired                     Lower Body Dressing: Moderate assistance;Sitting/lateral leans Lower Body Dressing Details (indicate cue type and reason): to don socks, rest break required Toilet Transfer: Minimal assistance;Rolling walker (2 wheels) Toilet Transfer Details (indicate cue type and reason): simulated Toileting- Clothing Manipulation and Hygiene: Minimal assistance;Sit to/from stand Toileting - Clothing Manipulation Details (indicate cue type and reason): anticipate     Functional mobility during ADLs: Min guard;+2 for safety/equipment;Rolling walker (2 wheels) (~63ft at room level)  Vision Baseline Vision/History: 1 Wears glasses (reading) Patient Visual Report: No change from baseline       Perception     Praxis      Pertinent Vitals/Pain Pain Assessment Pain  Assessment: No/denies pain     Hand Dominance     Extremity/Trunk Assessment Upper Extremity Assessment Upper Extremity Assessment: Generalized weakness   Lower Extremity Assessment Lower Extremity Assessment: Generalized weakness       Communication Communication Communication: No difficulties   Cognition Arousal/Alertness: Awake/alert Behavior During Therapy: WFL for tasks assessed/performed Overall Cognitive Status: Within Functional Limits for tasks assessed                                       General Comments  O2 sats decreased to 87% on 4L and HR elevated to 133bpm with exertion. Quick fatigue and some nausea noted.    Exercises Other Exercises Other Exercises: OT provided education re: role of OT, OT POC, post acute recs, sitting up for all meals, EOB/OOB mobility with assistance, home/fall safety, energy conservation (pursed lip breathing, rest breaks)   Shoulder Instructions      Home Living Family/patient expects to be discharged to:: Private residence Living Arrangements: Spouse/significant other Available Help at Discharge: Family Type of Home: House Home Access: Level entry     Home Layout: One level     Bathroom Shower/Tub: Tub/shower unit         Home Equipment: Grab bars - tub/shower          Prior Functioning/Environment Prior Level of Function : Independent/Modified Independent;Driving             Mobility Comments: primarily household ambulatory, however does endorce getting out into community and uses [facility] for mobility. Reports only 1 fall in last 6 months ADLs Comments: Pt reports IND in ADLs, Mod I IADLs (shared responsibility with husband), still drives        OT Problem List: Decreased strength;Decreased activity tolerance;Impaired balance (sitting and/or standing);Decreased safety awareness;Decreased knowledge of use of DME or AE;Cardiopulmonary status limiting activity      OT Treatment/Interventions:  Self-care/ADL training;Therapeutic exercise;Therapeutic activities;Energy conservation;DME and/or AE instruction;Patient/family education;Balance training    OT Goals(Current goals can be found in the care plan section) Acute Rehab OT Goals Patient Stated Goal: return home, get stronger OT Goal Formulation: With patient/family Time For Goal Achievement: 10/07/22 Potential to Achieve Goals: Good   OT Frequency: Min 2X/week    Co-evaluation PT/OT/SLP Co-Evaluation/Treatment: Yes Reason for Co-Treatment: Complexity of the patient's impairments (multi-system involvement);For patient/therapist safety;To address functional/ADL transfers PT goals addressed during session: Mobility/safety with mobility OT goals addressed during session: ADL's and self-care      AM-PAC OT "6 Clicks" Daily Activity     Outcome Measure Help from another person eating meals?: None Help from another person taking care of personal grooming?: A Little Help from another person toileting, which includes using toliet, bedpan, or urinal?: A Little Help from another person bathing (including washing, rinsing, drying)?: A Lot Help from another person to put on and taking off regular upper body clothing?: A Little Help from another person to put on and taking off regular lower body clothing?: A Lot 6 Click Score: 17   End of Session Equipment Utilized During Treatment: Gait belt;Rolling walker (2 wheels);Oxygen Nurse Communication: Mobility status  Activity Tolerance: Patient tolerated treatment well;Patient limited by fatigue Patient left: in bed;with call bell/phone  within reach;with bed alarm set;with family/visitor present  OT Visit Diagnosis: Unsteadiness on feet (R26.81);Muscle weakness (generalized) (M62.81);History of falling (Z91.81)                Time: 0350-0938 OT Time Calculation (min): 27 min Charges:  OT General Charges $OT Visit: 1 Visit OT Evaluation $OT Eval Low Complexity: 1 Low  [facility] MS, OTR/L ascom [phone]  09/23/22, 4:59 PM

## 2022-09-24 ENCOUNTER — Inpatient Hospital Stay: Payer: Medicare Other

## 2022-09-24 ENCOUNTER — Inpatient Hospital Stay: Payer: Self-pay

## 2022-09-24 DIAGNOSIS — K567 Ileus, unspecified: Secondary | ICD-10-CM | POA: Diagnosis not present

## 2022-09-24 DIAGNOSIS — J9621 Acute and chronic respiratory failure with hypoxia: Secondary | ICD-10-CM | POA: Diagnosis not present

## 2022-09-24 LAB — GLUCOSE, CAPILLARY
Glucose-Capillary: 221 mg/dL — ABNORMAL HIGH (ref 70–99)
Glucose-Capillary: 243 mg/dL — ABNORMAL HIGH (ref 70–99)
Glucose-Capillary: 146 mg/dL — ABNORMAL HIGH (ref 70–99)
Glucose-Capillary: 123 mg/dL — ABNORMAL HIGH (ref 70–99)
Glucose-Capillary: 127 mg/dL — ABNORMAL HIGH (ref 70–99)

## 2022-09-24 LAB — BASIC METABOLIC PANEL
Anion gap: 11 (ref 5–15)
BUN: 39 mg/dL — ABNORMAL HIGH (ref 8–23)
CO2: 23 mmol/L (ref 22–32)
Calcium: 10.2 mg/dL (ref 8.9–10.3)
Chloride: 105 mmol/L (ref 98–111)
Creatinine, Ser: 1.01 mg/dL — ABNORMAL HIGH (ref 0.44–1.00)
GFR, Estimated: 60 mL/min — ABNORMAL LOW (ref >60)
Glucose, Bld: 230 mg/dL — ABNORMAL HIGH (ref 70–99)
Potassium: 4.3 mmol/L (ref 3.5–5.1)
Sodium: 139 mmol/L (ref 135–145)

## 2022-09-24 LAB — CBC
HCT: 36.1 % (ref 36.0–46.0)
Hemoglobin: 11.4 g/dL — ABNORMAL LOW (ref 12.0–15.0)
MCH: 28.3 pg (ref 26.0–34.0)
MCHC: 31.6 g/dL (ref 30.0–36.0)
MCV: 89.6 fL (ref 80.0–100.0)
Platelets: 299 10*3/uL (ref 150–400)
RBC: 4.03 MIL/uL (ref 3.87–5.11)
RDW: 15.3 % (ref 11.5–15.5)
WBC: 15.6 10*3/uL — ABNORMAL HIGH (ref 4.0–10.5)
nRBC: 0 % (ref 0.0–0.2)

## 2022-09-24 LAB — PHOSPHORUS: Phosphorus: 3.9 mg/dL (ref 2.5–4.6)

## 2022-09-24 LAB — MAGNESIUM: Magnesium: 2.2 mg/dL (ref 1.7–2.4)

## 2022-09-24 MED ORDER — PROCHLORPERAZINE EDISYLATE 10 MG/2ML IJ SOLN
10.0000 mg | Freq: Once | INTRAMUSCULAR | Status: AC
  Administered 2022-09-24: 10 mg via INTRAVENOUS
  Filled 2022-09-24: qty 2

## 2022-09-24 MED ORDER — MORPHINE SULFATE (PF) 2 MG/ML IV SOLN
2.0000 mg | INTRAVENOUS | Status: AC
  Administered 2022-09-24: 2 mg via INTRAVENOUS
  Filled 2022-09-24: qty 1

## 2022-09-24 MED ORDER — SODIUM CHLORIDE 0.9% FLUSH: 10.0000 mL | INTRAVENOUS | Status: DC | PRN

## 2022-09-24 MED ORDER — BISACODYL 10 MG RE SUPP
10.0000 mg | Freq: Every day | RECTAL | Status: AC
  Administered 2022-09-25 – 2022-09-28 (×4): 10 mg via RECTAL
  Filled 2022-09-24 (×4): qty 1

## 2022-09-24 MED ORDER — DIAZEPAM 5 MG/ML IJ SOLN
INTRAMUSCULAR | Status: AC
  Administered 2022-09-24: 2.5 mg
  Filled 2022-09-24: qty 2

## 2022-09-24 MED ORDER — DIAZEPAM 5 MG/ML IJ SOLN: 2.5000 mg | Freq: Once | INTRAMUSCULAR | Status: DC

## 2022-09-24 MED ORDER — SODIUM CHLORIDE 0.9% FLUSH
10.0000 mL | Freq: Two times a day (BID) | INTRAVENOUS | Status: DC
  Administered 2022-09-24 – 2022-10-01 (×15): 10 mL
  Administered 2022-10-01: 40 mL
  Administered 2022-10-02 – 2022-10-05 (×7): 10 mL

## 2022-09-24 MED ORDER — IOHEXOL 300 MG/ML  SOLN
100.0000 mL | Freq: Once | INTRAMUSCULAR | Status: AC | PRN
  Administered 2022-09-24: 100 mL via INTRAVENOUS

## 2022-09-24 MED ORDER — BISACODYL 10 MG RE SUPP
10.0000 mg | Freq: Once | RECTAL | Status: AC
  Administered 2022-09-24: 10 mg via RECTAL
  Filled 2022-09-24: qty 1

## 2022-09-24 MED ORDER — HYDRALAZINE HCL 20 MG/ML IJ SOLN
10.0000 mg | INTRAMUSCULAR | Status: DC | PRN
  Administered 2022-09-25 – 2022-09-27 (×3): 20 mg via INTRAVENOUS
  Filled 2022-09-24 (×3): qty 1

## 2022-09-24 NOTE — Progress Notes (Addendum)
NAME:  Katherine Carter, MRN:  [id number], DOB:  Oct 20, 1950, LOS: 3 ADMISSION DATE:  09/21/2022, CONSULTATION DATE:  09/21/2021 REFERRING MD:  Dr. Sidney Ace, CHIEF COMPLAINT:  Weakness s/p falls, shortness of breath, cough   Brief Pt Description / Synopsis:  72 y.o. female, DNR/DNI, with PMHx significant for COPD on 4L supplemental O2 admitted with Acute on Chronic Hypoxic Respiratory Failure and severe sepsis in the setting of suspected Multifocal Pneumonia and AECOPD requiring BiPAP.  Also found to have AKI and concern for mild DKA.  History of Present Illness:  Katherine Carter is a 72 year old female with a past medical history significant for chronic hypoxic respiratory failure requiring 4 L supplemental oxygen due to COPD, diabetes mellitus, hypertension, GERD who presents to [facility] ED on 09/21/2022 due to generalized weakness status post multiple falls, dyspnea, and cough.  Patient is currently on BiPAP with increased work of breathing, therefore history is obtained from the patient's husband at bedside along with chart review.  Her husband reports that last Saturday she began to have cough and shortness of breath, which have worsened since then.  She is also had generalized weakness status post 2 falls, poor p.o. intake, and dysuria.  He denies any knowledge that she has had any chest pain, dizziness, abdominal pain, nausea, vomiting, diarrhea, fever, chills.  ED Course: Initial Vital Signs: Temperature 98 Fahrenheit orally, pulse 133, respiratory rate 22, blood pressure 109/75, SpO2 92% Significant Labs: Sodium 127, potassium 5.3, chloride 93, bicarb 20, anion gap 14, albumin 2.7, glucose 10/10/1987, BUN 42, creatinine 1.89, AST 53, ALT 55, total bili 1.4, BNP 110, high-sensitivity troponin 31, WBC 16.3 with neutrophilia, lactic acid 2.7, INR 1.3, PT 15.6 VBG on BiPAP: pH 7.17/pCO2 58/pO2/bicarb 21.2 EKG EKG shows sinus tach, RAD, somewhat peaked T waves Imaging Chest  X-ray>>IMPRESSION: 1. New patchy right greater than left perihilar opacities and pulmonary venous congestion. Differential considerations include asymmetric edema versus infection or aspiration. 2.  Aortic Atherosclerosis (ICD10-I70.0). CT head>>IMPRESSION: 1. No acute intracranial abnormality. 2. Chronic small vessel ischemic disease. CT cervical spine>>IMPRESSION: 1. Mildly motion degraded exam. 2. No evidence of acute fracture to the cervical spine. 3. 2-3 mm grade 1 anterolisthesis at C4-C5. 4. Cervical spondylosis, as described. Multilevel spinal canal stenosis. Most notably, a central disc protrusion contributes to at least moderate spinal canal stenosis at C3-C4. Multilevel bony neural foraminal narrowing also present within the cervical spine. A cervical spine MRI may be obtained for further evaluation, as clinically warranted. 5.  Aortic Atherosclerosis (ICD10-I70.0). Medications Administered: 3 L IV fluid boluses, IV's azithromycin, cefepime, vancomycin, 60 mg IV Solu-Medrol, multiple DuoNebs  On arrival to the ED she was tachycardic in the 150s and tachypneic, but initially saturating okay on her baseline 4 L.  She was noted to be pursed lip breathing with expiratory wheezing. Given her work of breathing, patient was placed on BiPAP.  Patient is very adamant that she would not want to be intubated or have CPR or ACLS performed.  PCCM asked to admit for further workup and treatment.  Please see "significant hospital events" section below for full detailed hospital course.  Pertinent  Medical History   Past Medical History:  Diagnosis Date   COPD (chronic obstructive pulmonary disease) (HCC)    Diabetes mellitus without complication (HCC)    GERD (gastroesophageal reflux disease)    Hypertension     Micro Data:  1/18: SARS-CoV-2/RSV/influenza PCR>> negative 1/18: Blood culture x 2>> NGTD 1/18: Urine>>less than 10,000 colonies/ml insignificant growth  1/18: Respiratory  viral panel>> negative 1/18: Strep pneumo and Legionella urinary antigens>> negative 1/18: MRSA PCR>>negative  Antimicrobials:  Azithromycin 1/18>> Cefepime 1/18>> Vancomycin 1/18>>1/19  Significant Hospital Events: Including procedures, antibiotic start and stop dates in addition to other pertinent events   1/18: Presents to [facility] ED, with severe respiratory distress requiring BiPAP.  Patient adamant she would want to be DNR/DNI.  PCCM asked to admit 1/19: Weaned off BiPAP to baseline 4L [address].  DKI resolved, AKI improving.  PT/OT consulted.  Vancomycin d/c as MRSA PCR is negative 1/19: CTA Chest>>No evidence of significant pulmonary embolus. Dense        consolidation in the right lung likely representing pneumonia. Emphysematous        changes in the lungs.  Aortic atherosclerosis. 1/20: Remains off Bipap tolerating 4L via nasal canula; will transfer service to [facility]  1/20: Pts respiratory status declined last night requiring Bipap.  Pt also severely hypertensive sbp 200's/100's and tachycardic hr 150's nicardipine gtt initiated 1/21: Pt with projectile vomiting and abdominal distension Bipap removed due to concerns of aspiration.  CT Abd Pelvis revealed small bowel obstruction General Surgery consulted    Interim History / Subjective:  As outlined above   Objective   Blood pressure (!) 141/90, pulse (!) 118, temperature 97.9 F (36.6 C), temperature source Oral, resp. rate 19, height 5\' 3"  (1.6 m), weight 71.6 kg, SpO2 96 %.    FiO2 (%):  [35 %] 35 %   Intake/Output Summary (Last 24 hours) at 09/24/2022 0734 Last data filed at 09/24/2022 0700 Gross per 24 hour  Intake 1340.31 ml  Output 700 ml  Net 640.31 ml   Filed Weights   09/21/22 1815 09/23/22 0500 09/24/22 0400  Weight: 69.6 kg 74 kg 71.6 kg    Examination: General: Acute on chronically ill-appearing female, laying in bed, in mild respiratory distress on 6L O2 via nasal canula HENT: Atraumatic, normocephalic, neck supple,  no JVDLungs: Faint rhonchi throughout, even, non labored  Cardiovascular: Sinus tachycardia, s1-s2, no m/r/g, 2+ radial/2+ distal pulses, no edema  Abdomen: +BS x4, soft, non tender, distension  Extremities: Normal bulk and tone, generalized weakness, no deformities Neuro: Awake confused to situation and time, follows commands no focal deficits GU: External catheter in place draining yellow urine  Resolved Hospital Problem list   Mild DKA   Assessment & Plan:   #Acute on Chronic Hypoxic Respiratory Failure in the setting of AECOPD & suspected community-acquired pneumonia PMHx: COPD on 4L supplemental O2 at baseline - Supplemental O2 as needed to maintain O2 sats 88 to 92% - Bipap as need for dyspnea and/or hypercapnia  - Follow intermittent Chest X-ray & ABG as needed - Bronchodilators & Pulmicort nebs - IV Steroids wean as tolerated  - ABX as above - Diuresis as BP and renal function permits - Pulmonary toilet as able - Prn morphine for air hunger  #Severe Sepsis due to suspected community-acquired Pneumonia (met SIRS Criteria at admission: HR >90, RR >20, WBC 16.3) #HTN - Trend WBC and monitor fever curve  - Trend PCT  - Continue empiric azithromycin and cefepime pending cultures & sensitivities  #Small bowel obstruction  - General Surgery consulted appreciate input  - Will attempt NGT placement with decompression  - Keep NPO for bowel rest   #Acute Kidney Injury ~ IMPROVING #Hypertonic Hyponatremia in setting of severe hyperglycemia (corrected Na for glucose is Na 135) ~ RESOLVED #Mild Hyperkalemia ~ RESOLVED #Anion Gap Metabolic Acidosis ~ RESOLVED - Trend BMP  -  Replace electrolytes as indicated  - Monitor UOP - Avoid nephrotoxic medications   #Diabetes Mellitus Type II -CBG's q4h; Target range of 140 to 180 -SSI and scheduled levemir  -Follow ICU Hypo/Hyperglycemia protocol  Best Practice (right click and "Reselect all SmartList Selections" daily)   Diet/type:  NPO  DVT prophylaxis: prophylactic heparin  GI prophylaxis: PPI Lines: Right femoral CVC (yes due to limited Peripheral access) Foley:  N/A Code Status:  DNR Last date of multidisciplinary goals of care discussion [09/24/2021]  1/21: Pt husband updated via telephone regarding decline in pts condition and new findings of small bowel obstruction all questions were answered.  He confirmed DNR/DNI status and stated he has had discussions with his wife and she was adamant her code status is DNR  Labs   CBC: Recent Labs  Lab 09/21/22 1341 09/22/22 0500 09/23/22 0506 09/24/22 0349  WBC 16.3* 13.8* 13.8* 15.6*  NEUTROABS 14.7*  --   --   --   HGB 13.2 9.5* 9.5* 11.4*  HCT 41.7 30.1* 30.6* 36.1  MCV 91.0 91.2 91.6 89.6  PLT 255 188 205 299    Basic Metabolic Panel: Recent Labs  Lab 09/21/22 1341 09/21/22 1903 09/21/22 2349 09/22/22 0500 09/23/22 0506 09/24/22 0349  NA 127*  127* 133* 137 137 134* 139  K 5.4*  5.3* 4.7 4.4 4.1 4.0 4.3  CL 94*  93* 106 112* 111 107 105  CO2 19*  20* 18* 18* 24 21* 23  GLUCOSE 574*  589* 403* 191* 194* 187* 230*  BUN 41*  42* 34* 31* 28* 38* 39*  CREATININE 1.88*  1.89* 1.54* 1.16* 1.06* 1.02* 1.01*  CALCIUM 9.3  9.5 8.3* 8.7* 8.7* 9.5 10.2  MG 2.5*  --   --  2.1  --  2.2  PHOS  --   --   --  3.2 2.6 3.9   GFR: Estimated Creatinine Clearance: 48.5 mL/min (A) (by C-G formula based on SCr of 1.01 mg/dL (H)). Recent Labs  Lab 09/21/22 1341 09/21/22 1649 09/21/22 2349 09/22/22 0500 09/23/22 0506 09/24/22 0349  PROCALCITON 13.55  --   --  11.94 7.84  --   WBC 16.3*  --   --  13.8* 13.8* 15.6*  LATICACIDVEN 2.7* 3.9* 2.4* 1.8  --   --     Liver Function Tests: Recent Labs  Lab 09/21/22 1341 09/22/22 0500 09/23/22 0506  AST 53* 34  --   ALT 55* 36  --   ALKPHOS 120 84  --   BILITOT 1.4* 0.5  --   PROT 9.1* 6.8  --   ALBUMIN 2.7* 1.8*  1.9* 1.8*   No results for input(s): "LIPASE", "AMYLASE" in the last 168 hours. No  results for input(s): "AMMONIA" in the last 168 hours.  ABG    Component Value Date/Time   PHART 7.33 (L) 09/02/2021 2117   PCO2ART 51 (H) 09/02/2021 2117   PO2ART 249 (H) 09/02/2021 2117   HCO3 19.2 (L) 09/21/2022 2349   ACIDBASEDEF 4.5 (H) 09/21/2022 2349   O2SAT 99.8 09/21/2022 2349     Coagulation Profile: Recent Labs  Lab 09/21/22 1341  INR 1.3*    Cardiac Enzymes: Recent Labs  Lab 09/21/22 1341  CKTOTAL 68    HbA1C: Hgb A1c MFr Bld  Date/Time Value Ref Range Status  09/22/2022 05:00 AM 9.0 (H) 4.8 - 5.6 % Final    Comment:    (NOTE) Pre diabetes:          5.7%-6.4%  Diabetes:              >6.4%  Glycemic control for   <7.0% adults with diabetes   06/04/2022 03:37 PM 8.5 (H) 4.8 - 5.6 % Final    Comment:    (NOTE) Pre diabetes:          5.7%-6.4%  Diabetes:              >6.4%  Glycemic control for   <7.0% adults with diabetes     CBG: Recent Labs  Lab 09/23/22 1149 09/23/22 1556 09/23/22 1944 09/23/22 2339 09/24/22 0332  GLUCAP 230* 178* 153* 197* 221*    Review of Systems:   Positives in BOLD: Gen: Denies fever, chills, weight change, fatigue, night sweats HEENT: Denies blurred vision, double vision, hearing loss, tinnitus, sinus congestion, rhinorrhea, sore throat, neck stiffness, dysphagia PULM: shortness of breath, cough, sputum production, hemoptysis, wheezing CV: Denies chest pain, edema, orthopnea, paroxysmal nocturnal dyspnea, palpitations GI: abdominal pain, nausea, vomiting, diarrhea, hematochezia, melena, constipation, change in bowel habits GU: Denies dysuria, hematuria, polyuria, oliguria, urethral discharge Endocrine: Denies hot or cold intolerance, polyuria, polyphagia or appetite change Derm: Denies rash, dry skin, scaling or peeling skin change Heme: Denies easy bruising, bleeding, bleeding gums Neuro: Denies headache, numbness, weakness, slurred speech, loss of memory or consciousness  Past Medical History:  She,  has  a past medical history of COPD (chronic obstructive pulmonary disease) (HCC), Diabetes mellitus without complication (HCC), GERD (gastroesophageal reflux disease), and Hypertension.   Surgical History:   Past Surgical History:  Procedure Laterality Date   ABDOMINAL HYSTERECTOMY     COLONOSCOPY WITH PROPOFOL N/A 01/23/2017   Procedure: COLONOSCOPY WITH PROPOFOL;  Surgeon: Christena Deem, MD;  Location[facility] ENDOSCOPY;  Service: Endoscopy;  Laterality: N/A;   FOOT SURGERY       Social History:   reports that she quit smoking about 8 years ago. Her smoking use included cigarettes. She has a 12.50 pack-year smoking history. She has never used smokeless tobacco. She reports that she does not drink alcohol and does not use drugs.   Family History:  Her family history includes CAD in her father and mother. There is no history of Breast cancer.   Allergies Allergies  Allergen Reactions   Penicillins Hives and Other (See Comments)    Has patient had a PCN reaction causing immediate rash, facial/tongue/throat swelling, SOB or lightheadedness with hypotension: No Has patient had a PCN reaction causing severe rash involving mucus membranes or skin necrosis: No Has patient had a PCN reaction that required hospitalization: No Has patient had a PCN reaction occurring within the last 10 years: No If all of the above answers are "NO", then may proceed with Cephalosporin use.     Home Medications  Prior to Admission medications   Medication Sig Start Date End Date Taking? Authorizing Provider  celecoxib (CELEBREX) 100 MG capsule Take 100 mg by mouth daily. 08/10/22  Yes [provider]  fluticasone (FLONASE) 50 MCG/ACT nasal spray Place 1 spray into both nostrils daily. 09/06/22  Yes [provider]  latanoprost (XALATAN) 0.005 % ophthalmic solution Place 1 drop into both eyes 2 (two) times daily. 08/04/22  Yes [provider]  losartan (COZAAR) 100 MG tablet Take 100 mg by  mouth daily. 08/24/22  Yes [provider]  ondansetron (ZOFRAN) 4 MG tablet Take 4 mg by mouth every 8 (eight) hours as needed for nausea or vomiting. 07/04/22  Yes [provider]  TRULICITY  0.75 MG/0.5ML SOPN Inject 0.75 mg into the skin once a week. 06/28/22  Yes [provider]  albuterol (VENTOLIN HFA) 108 (90 Base) MCG/ACT inhaler Inhale 2 puffs into the lungs every 4 (four) hours as needed for wheezing or shortness of breath.    [provider]  amLODipine (NORVASC) 5 MG tablet Take 1 tablet (5 mg total) by mouth daily. 09/05/21   Hosie Poisson, MD  aspirin EC 81 MG tablet Take 81 mg by mouth daily. 03/15/22   [provider]  atorvastatin (LIPITOR) 40 MG tablet Take 1 tablet (40 mg total) by mouth every evening. 09/05/21   Hosie Poisson, MD  benzonatate (TESSALON) 100 MG capsule Take 2 capsules (200 mg total) by mouth every 8 (eight) hours. 05/28/22   Margarette Canada, NP  budesonide-formoterol [facility]) 160-4.5 MCG/ACT inhaler Inhale 2 puffs into the lungs 2 (two) times daily.    [provider]  Dextromethorphan-guaiFENesin (MUCINEX DM) 30-600 MG TB12 SMARTSIG:1-2 Tablet(s) By Mouth Every 12 Hours PRN 04/28/22   [provider]  guaiFENesin-codeine (ROBITUSSIN AC) 100-10 MG/5ML syrup Take 5 mLs by mouth 3 (three) times daily as needed for cough. 05/28/22   Margarette Canada, NP  ipratropium (ATROVENT) 0.06 % nasal spray Place 2 sprays into both nostrils 4 (four) times daily. 05/28/22   Margarette Canada, NP  ipratropium-albuterol (DUONEB) 0.5-2.5 (3) MG/3ML SOLN Take 3 mLs by nebulization every 4 (four) hours as needed. Patient taking differently: Take 3 mLs by nebulization every 4 (four) hours as needed. Last dose: 730 am. 09/05/21   Hosie Poisson, MD  JARDIANCE 10 MG TABS tablet Take 10 mg by mouth daily. 09/07/21   [provider]  metFORMIN (GLUCOPHAGE-XR) 500 MG 24 hr tablet Take 500 mg by mouth daily with breakfast.    [provider]  methocarbamol (ROBAXIN) 500 MG tablet Take 500 mg by mouth every 6 (six) hours as needed for muscle spasms. 06/21/21   [provider]  MIRALAX 17 g packet Take 17 g by mouth daily as needed for mild constipation. 06/23/21   [provider]  ondansetron (ZOFRAN-ODT) 4 MG disintegrating tablet Take 1 tablet (4 mg total) by mouth every 8 (eight) hours as needed. 09/01/22   Brimage, Vondra, DO  OZEMPIC, 0.25 OR 0.5 MG/DOSE, 2 MG/3ML SOPN Inject into the skin. 04/26/22   [provider]  pantoprazole (PROTONIX) 40 MG tablet Take 1 tablet (40 mg total) by mouth daily. 06/11/21   Coral Spikes, DO  Respiratory Therapy Supplies (ADULT MASK LARGE) MISC See admin instructions. use with inhaler 10/17/21   [provider]  Spacer/Aero-Holding Chambers (AEROCHAMBER PLUS) inhaler Use with inhaler 10/15/21   Melynda Ripple, MD  tiotropium (SPIRIVA HANDIHALER) 18 MCG inhalation capsule Place 1 capsule (18 mcg total) into inhaler and inhale daily. Patient not taking: Reported on 06/05/2022 09/05/21   Hosie Poisson, MD  [place] 18 MG/3ML SOPN     [provider]  VOLTAREN ARTHRITIS PAIN 1 % GEL Apply 2 g topically 4 (four) times daily. 03/22/22   [provider]  XOPENEX HFA 45 MCG/ACT inhaler Inhale 1 puff into the lungs 3 (three) times daily. 11/02/21   [provider]     Critical Care time: 50 minutes     Donell Beers, [place] Pager [phone] (please enter 7 digits) PCCM Consult Pager [phone] (please enter 7 digits)

## 2022-09-24 NOTE — Progress Notes (Incomplete)
[facility] Progress Note  Patient: Katherine Carter    TOI:[id number]  DOA: 09/21/2022     Date of Service: the patient was seen and examined on 09/24/2022  Chief Complaint  Patient presents with   Weakness   Brief hospital course: 72 y.o. female, DNR/DNI, with PMHx significant for COPD on 4L supplemental O2 admitted with Acute on Chronic Hypoxic Respiratory Failure and severe sepsis in the setting of suspected Multifocal Pneumonia and AECOPD requiring BiPAP. Also found to have AKI and concern for mild DKA.  Patient was admitted in the ICU on 09/21/2022, I started taking care of this patient on 09/24/2022.  Please review prior detailed notes, further management as below.   Assessment and Plan:     # Pancreatic cyst, incidental finding. Small cystic lesion within tail of pancreas measures 0.7 cm. Recommend follow-up imaging in 24 months with pancreas protocol MRI.   Body mass index is 27.96 kg/m.    Interventions:        Diet: *** DVT Prophylaxis: {DVTprophylaxisprnv:22509}   Advance goals of care discussion: {Palliative Code status:23503}  Family Communication: ***family was present at bedside, at the time of interview.  ***The pt provided permission to discuss medical plan with the family. Opportunity was given to ask question and all questions were answered satisfactorily.   Disposition:  Pt is from ***, admitted with ***, still has ***, which precludes a safe discharge. Discharge to ***, when ***.  Subjective: ***  Physical Exam: General: NAD, lying comfortably Appear in no distress, affect appropriate Eyes: PERRLA ENT: Oral Mucosa Clear, moist  Neck: no JVD,  Cardiovascular: S1 and S2 Present, no Murmur,  Respiratory: good respiratory effort, Bilateral Air entry equal and Decreased, no Crackles, no wheezes Abdomen: Bowel Sound present, Soft and no tenderness,  Skin: no rashes Extremities: no Pedal edema, no calf tenderness Neurologic: without  any new focal findings Gait not checked due to patient safety concerns  Vitals:   09/24/22 0630 09/24/22 0645 09/24/22 0700 09/24/22 0720  BP: (!) 147/103 (!) 164/100 (!) 141/90   Pulse: (!) 122 (!) 135 (!) 118   Resp: 15 (!) 21 19   Temp:      TempSrc:      SpO2: 98% 92% 100% 96%  Weight:      Height:        Intake/Output Summary (Last 24 hours) at 09/24/2022 0826 Last data filed at 09/24/2022 0700 Gross per 24 hour  Intake 1340.31 ml  Output 700 ml  Net 640.31 ml   Filed Weights   09/21/22 1815 09/23/22 0500 09/24/22 0400  Weight: 69.6 kg 74 kg 71.6 kg    Data Reviewed: I have personally reviewed and interpreted daily labs, tele strips, imagings as discussed above. I reviewed all nursing notes, pharmacy notes, vitals, pertinent old records I have discussed plan of care as described above with RN and patient/family.  CBC: Recent Labs  Lab 09/21/22 1341 09/22/22 0500 09/23/22 0506 09/24/22 0349  WBC 16.3* 13.8* 13.8* 15.6*  NEUTROABS 14.7*  --   --   --   HGB 13.2 9.5* 9.5* 11.4*  HCT 41.7 30.1* 30.6* 36.1  MCV 91.0 91.2 91.6 89.6  PLT 255 188 205 833   Basic Metabolic Panel: Recent Labs  Lab 09/21/22 1341 09/21/22 1903 09/21/22 2349 09/22/22 0500 09/23/22 0506 09/24/22 0349  NA 127*  127* 133* 137 137 134* 139  K 5.4*  5.3* 4.7 4.4 4.1 4.0 4.3  CL 94*  93* 106 112* 111  107 105  CO2 19*  20* 18* 18* 24 21* 23  GLUCOSE 574*  589* 403* 191* 194* 187* 230*  BUN 41*  42* 34* 31* 28* 38* 39*  CREATININE 1.88*  1.89* 1.54* 1.16* 1.06* 1.02* 1.01*  CALCIUM 9.3  9.5 8.3* 8.7* 8.7* 9.5 10.2  MG 2.5*  --   --  2.1  --  2.2  PHOS  --   --   --  3.2 2.6 3.9    Studies: CT ABDOMEN PELVIS W CONTRAST  Result Date: 09/24/2022 CLINICAL DATA:  Evaluate for bowel obstruction. EXAM: CT ABDOMEN AND PELVIS WITH CONTRAST TECHNIQUE: Multidetector CT imaging of the abdomen and pelvis was performed using the standard protocol following bolus administration of  intravenous contrast. RADIATION DOSE REDUCTION: This exam was performed according to the departmental dose-optimization program which includes automated exposure control, adjustment of the mA and/or kV according to patient size and/or use of iterative reconstruction technique. CONTRAST:  150mL OMNIPAQUE IOHEXOL 300 MG/ML  SOLN COMPARISON:  Plain film radiographs from earlier today. FINDINGS: Lower chest: Mild ground-glass attenuation noted within the imaged portions of the right upper lobe. Subpleural consolidation and ground-glass attenuation is noted in the posterior right lower lobe. No pleural fluid. Hepatobiliary: No focal liver abnormality is seen. Status post cholecystectomy. No biliary dilatation. Pancreas: Small cystic lesion within tail of pancreas measures 0.7 cm, image 27/2. No main duct dilatation, inflammation or suspicious mass. Spleen: Normal in size without focal abnormality. Adrenals/Urinary Tract: Normal adrenal glands. No nephrolithiasis or hydronephrosis. There is an accessory renal artery arising off the inferior pole of the right kidney. Small bilateral kidney cysts noted. No follow-up imaging recommended. Urinary bladder is unremarkable. Stomach/Bowel: Stomach appears normal. The appendix is visualized and appears normal. Dilated mid small bowel loops identified with scattered air-fluid levels. These measure up to 3.6 cm in diameter compatible with small bowel obstruction. Transition to diminished caliber distal small bowel noted within the right lower quadrant of the abdomen, image 66/2 and sagittal image 44/6. Normal caliber of the colon. Scat scratch set diffuse colonic diverticulosis without signs of acute diverticulitis. Vascular/Lymphatic: Aortic atherosclerosis. Normal caliber abdominal aorta. There is a right inguinal central venous catheter with tip terminating in the distal right common iliac vein. Just beyond the common iliac bifurcation there is focal aneurysmal dilatation of the  right internal iliac artery measures 2.1 cm. This appears nearly completely thrombosed by noncalcified plaque with reconstitution distal to the aneurysm. No signs of abdominopelvic adenopathy. Reproductive: Status post hysterectomy. No adnexal masses. Other: Trace free fluid within the dependent pelvis. No fluid collections. No signs of pneumoperitoneum. Lobulated fat containing umbilical hernia measures 6.2 cm, image 70/6. Musculoskeletal: No acute or significant osseous findings. Lumbar spondylosis. IMPRESSION: 1. Examination is positive for small bowel obstruction with transition to diminished caliber distal small bowel within the right lower quadrant of the abdomen at the level of distal ileum. 2. Subpleural consolidation and ground-glass attenuation in the posterior right lower lobe. Findings are concerning for pneumonia. 3. 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 This appears nearly completely thrombosed by noncalcified plaque with reconstitution distal to the aneurysm. 4. Small cystic lesion within tail of pancreas measures 0.7 cm. Recommend follow-up imaging in 24 months with pancreas protocol MRI. This recommendation follows ACR consensus guidelines: Managing Incidental Findings on Abdominal CT: White Paper of the ACR Incidental Findings Committee. J Am Coll Radiol 2010;7:754-773. 5. Fat containing umbilical hernia. 6.  Aortic Atherosclerosis (ICD10-I70.0). Electronically Signed   By: Lovena Le  Bradly Chris M.D.   On: 09/24/2022 06:32   DG Chest 1 View  Result Date: 09/24/2022 CLINICAL DATA:  Shortness of breath and abdominal distension. EXAM: CHEST  1 VIEW COMPARISON:  09/21/2022 FINDINGS: Stable cardiomediastinal contours. No pleural fluid or interstitial edema. Advanced changes of emphysema. Extensive airspace disease within the perihilar right upper lobe is again noted and is stable to slightly progressive when compared with the previous exam. Left lung remains clear. IMPRESSION: 1. Stable to slightly  progressive airspace disease within the perihilar right upper lobe. 2. Advanced emphysema. Electronically Signed   By: Signa Kell M.D.   On: 09/24/2022 05:24   DG Abd 1 View  Result Date: 09/24/2022 CLINICAL DATA:  Abdominal distension and shortness of breath. EXAM: ABDOMEN - 1 VIEW COMPARISON:  06/11/2021 FINDINGS: Right femoral catheter is identified with tip terminating over the right sacral wing. The bowel gas pattern is abnormal. Multiple dilated small bowel loops are identified measuring up to 3.9 cm. Gas is noted within the colon up to the level of the rectum. IMPRESSION: Abnormal bowel gas pattern with dilated small bowel loops and gas noted within the colon. Findings concerning for small bowel obstruction versus ileus. Electronically Signed   By: Signa Kell M.D.   On: 09/24/2022 05:18    Scheduled Meds:  amLODipine  5 mg Oral Daily   atorvastatin  40 mg Oral QPM   budesonide (PULMICORT) nebulizer solution  0.5 mg Nebulization BID   Chlorhexidine Gluconate Cloth  6 each Topical Q0600   enoxaparin (LOVENOX) injection  40 mg Subcutaneous QHS   insulin aspart  0-20 Units Subcutaneous TID WC   insulin aspart  0-5 Units Subcutaneous QHS   insulin detemir  0.3 Units/kg Subcutaneous Q24H   ipratropium-albuterol  3 mL Nebulization Q6H   methylPREDNISolone (SOLU-MEDROL) injection  40 mg Intravenous Daily   mouth rinse  15 mL Mouth Rinse 4 times per day   Continuous Infusions:  sodium chloride 10 mL/hr at 09/24/22 0700   azithromycin Stopped (09/23/22 1317)   ceFEPime (MAXIPIME) IV 2 g (09/24/22 0825)   niCARDipine 8 mg/hr (09/24/22 0817)   PRN Meds: sodium chloride, acetaminophen, benzonatate, dextrose, docusate sodium, guaiFENesin, morphine injection, ondansetron (ZOFRAN) IV, mouth rinse, polyethylene glycol  Time spent: 35 minutes  Author: Gillis Santa. MD [facility] 09/24/2022 8:26 AM  To reach On-call, see care teams to locate the attending and reach out to them via  [email or web address]. If 7PM-7AM, please contact night-coverage If you still have difficulty reaching the attending provider, please page the [facility] (Director on Call) for [facility] on amion for assistance.

## 2022-09-24 NOTE — Consult Note (Signed)
Patient ID: Katherine Carter, female   DOB: 26-Dec-1950, 72 y.o.   MRN: [id number] Duplicate note, see other .

## 2022-09-24 NOTE — Plan of Care (Signed)
  Problem: Education: Goal: Ability to describe self-care measures that may prevent or decrease complications (Diabetes Survival Skills Education) will improve Outcome: Progressing Goal: Individualized Educational Video(s) Outcome: Progressing   Problem: Coping: Goal: Ability to adjust to condition or change in health will improve Outcome: Progressing   Problem: Fluid Volume: Goal: Ability to maintain a balanced intake and output will improve Outcome: Progressing   Problem: Health Behavior/Discharge Planning: Goal: Ability to identify and utilize available resources and services will improve Outcome: Progressing Goal: Ability to manage health-related needs will improve Outcome: Progressing   Problem: Metabolic: Goal: Ability to maintain appropriate glucose levels will improve Outcome: Progressing   Problem: Nutritional: Goal: Maintenance of adequate nutrition will improve Outcome: Progressing Goal: Progress toward achieving an optimal weight will improve Outcome: Progressing   Problem: Skin Integrity: Goal: Risk for impaired skin integrity will decrease Outcome: Progressing   Problem: Tissue Perfusion: Goal: Adequacy of tissue perfusion will improve Outcome: Progressing   Problem: Education: Goal: Knowledge of General Education information will improve Description: Including pain rating scale, medication(s)/side effects and non-pharmacologic comfort measures Outcome: Progressing   Problem: Health Behavior/Discharge Planning: Goal: Ability to manage health-related needs will improve Outcome: Progressing   Problem: Clinical Measurements: Goal: Ability to maintain clinical measurements within normal limits will improve Outcome: Progressing Goal: Will remain free from infection Outcome: Progressing Goal: Diagnostic test results will improve Outcome: Progressing Goal: Respiratory complications will improve Outcome: Progressing Goal: Cardiovascular complication will  be avoided Outcome: Progressing   Problem: Activity: Goal: Risk for activity intolerance will decrease Outcome: Progressing   Problem: Nutrition: Goal: Adequate nutrition will be maintained Outcome: Progressing   Problem: Coping: Goal: Level of anxiety will decrease Outcome: Progressing   Problem: Elimination: Goal: Will not experience complications related to bowel motility Outcome: Progressing Goal: Will not experience complications related to urinary retention Outcome: Progressing   Problem: Pain Managment: Goal: General experience of comfort will improve Outcome: Progressing   Problem: Safety: Goal: Ability to remain free from injury will improve Outcome: Progressing   Problem: Skin Integrity: Goal: Risk for impaired skin integrity will decrease Outcome: Progressing   Problem: Education: Goal: Ability to describe self-care measures that may prevent or decrease complications (Diabetes Survival Skills Education) will improve Outcome: Progressing Goal: Individualized Educational Video(s) Outcome: Progressing   Problem: Cardiac: Goal: Ability to maintain an adequate cardiac output will improve Outcome: Progressing   Problem: Health Behavior/Discharge Planning: Goal: Ability to identify and utilize available resources and services will improve Outcome: Progressing Goal: Ability to manage health-related needs will improve Outcome: Progressing   Problem: Fluid Volume: Goal: Ability to achieve a balanced intake and output will improve Outcome: Progressing   Problem: Metabolic: Goal: Ability to maintain appropriate glucose levels will improve Outcome: Progressing   Problem: Nutritional: Goal: Maintenance of adequate nutrition will improve Outcome: Progressing Goal: Maintenance of adequate weight for body size and type will improve Outcome: Progressing   Problem: Respiratory: Goal: Will regain and/or maintain adequate ventilation Outcome: Progressing    Problem: Urinary Elimination: Goal: Ability to achieve and maintain adequate renal perfusion and functioning will improve Outcome: Progressing

## 2022-09-24 NOTE — Consult Note (Addendum)
PHARMACY CONSULT NOTE - ELECTROLYTES  Pharmacy Consult for Electrolyte Monitoring and Replacement   Recent Labs: Potassium (mmol/L)  Date Value  09/24/2022 4.3  05/31/2014 4.5   Magnesium (mg/dL)  Date Value  09/24/2022 2.2   Calcium (mg/dL)  Date Value  09/24/2022 10.2   Calcium, Total (mg/dL)  Date Value  05/31/2014 9.5   Albumin (g/dL)  Date Value  09/23/2022 1.8 (L)  05/30/2014 3.7   Phosphorus (mg/dL)  Date Value  09/24/2022 3.9   Sodium (mmol/L)  Date Value  09/24/2022 139  05/31/2014 142   Assessment  Katherine Carter is a 72 y.o. female presenting with pneumonia. PMH significant for COPD, GERD, HTN, DM. Pharmacy has been consulted to monitor and replace electrolytes.  Diet: Regular  Goal of Therapy: Electrolytes WNL  Plan:  --No electrolyte replacement indicated at this time --Patient care has been transferred from PCCM to [facility]. Will discontinue electrolyte consult at this time. Defer further ordering of labs and electrolyte replacement to primary team --Pharmacy will continue to follow along peripherally  Thank you for allowing pharmacy to be a part of this patient's care.  Benita Gutter 09/24/2022 6:58 AM

## 2022-09-24 NOTE — Progress Notes (Signed)
Pt unable to tolerate BiPAP due to multiple episodes of projectile vomiting. She remains tachycardic with pursed lip breathing otherwise in no acute distress. Abdomen distended without c/o pain or tenderness to palpation.  STAT chest xray and KUB ordered for further evaluation  09/24/22: KUB>Abnormal bowel gas pattern with dilated small bowel loops and gas noted within the colon. Findings concerning for small bowel obstruction versus ileus  Plan -Obtain CT abd/pelvis  -Start IVFs -Keep NPO -NGT for decompression.  -PRN antiemetics -General surgery consult pending CT abd/pelvis    Rufina Falco, DNP, CCRN, FNP-C, AGACNP-BC Acute Care & Family Nurse Practitioner  [facility]  See Amion for personal pager PCCM on call pager [phone] until 7 am

## 2022-09-24 NOTE — Progress Notes (Signed)
Peripherally Inserted Central Catheter Placement  The IV Nurse has discussed with the patient and/or persons authorized to consent for the patient, the purpose of this procedure and the potential benefits and risks involved with this procedure.  The benefits include less needle sticks, lab draws from the catheter, and the patient may be discharged home with the catheter. Risks include, but not limited to, infection, bleeding, blood clot (thrombus formation), and puncture of an artery; nerve damage and irregular heartbeat and possibility to perform a PICC exchange if needed/ordered by physician.  Alternatives to this procedure were also discussed.  Bard Power PICC patient education guide, fact sheet on infection prevention and patient information card has been provided to patient /or left at bedside.    PICC Placement Documentation  PICC Triple Lumen 09/24/22 Right Brachial 34 cm 0 cm (Active)  Indication for Insertion or Continuance of Line Administration of hyperosmolar/irritating solutions (i.e. TPN, Vancomycin, etc.) 09/24/22 1058  Exposed Catheter (cm) 0 cm 09/24/22 1058  Site Assessment Clean, Dry, Intact 09/24/22 1058  Lumen #1 Status Saline locked;Flushed;Blood return noted 09/24/22 1058  Lumen #2 Status Saline locked;Flushed;Blood return noted 09/24/22 1058  Lumen #3 Status Saline locked;Flushed;Blood return noted 09/24/22 1058  Dressing Type Transparent;Securing device 09/24/22 1058  Dressing Status Antimicrobial disc in place;Clean, Dry, Intact 09/24/22 1058  Safety Lock Not Applicable 27/74/12 8786  Line Care Connections checked and tightened 09/24/22 1058  Line Adjustment (NICU/IV Team Only) No 09/24/22 1058  Dressing Intervention New dressing 09/24/22 1058  Dressing Change Due 10/01/22 09/24/22 Reynolds, Tayna [place] Chenice 09/24/2022, 11:00 AM

## 2022-09-24 NOTE — Progress Notes (Signed)
Patient with two incidence of tachycardiac, Dr. Mortimer Fries notified and ordered valium.

## 2022-09-24 NOTE — Consult Note (Signed)
Patient ID: Katherine Carter, female   DOB: Oct 29, 1950, 72 y.o.   MRN: [id number]  HPI Katherine Carter is a 72 y.o. female history of COPD admitted for acute on chronic respiratory failure and sepsis likely from multifocal pneumonia.  Was found to have acute kidney injury.  She was transferred to the ICU last couple of days and yesterday started having some abdominal discomfort and this morning having more abdominal pain.  The pain was diffuse intermittent moderate intensity.  There was a CT scan that was obtained that have personally reviewed showing evidence of dilated loops of bowel but no evidence of pneumatosis or closed-loop obstruction. NG tube could not be placed.  She now feels better.  She continues to have respiratory issues.  She denies any abdominal pain.  She did have a bowel movement and is passing flatus.  HPI  Past Medical History:  Diagnosis Date   COPD (chronic obstructive pulmonary disease) ([place])    Diabetes mellitus without complication (HCC)    GERD (gastroesophageal reflux disease)    Hypertension     Past Surgical History:  Procedure Laterality Date   ABDOMINAL HYSTERECTOMY     COLONOSCOPY WITH PROPOFOL N/A 01/23/2017   Procedure: COLONOSCOPY WITH PROPOFOL;  Surgeon: Lollie Sails, MD;  Location: [facility] ENDOSCOPY;  Service: Endoscopy;  Laterality: N/A;   FOOT SURGERY      Family History  Problem Relation Age of Onset   CAD Mother    CAD Father    Breast cancer Neg Hx     Social History Social History   Tobacco Use   Smoking status: Former    Packs/day: 0.50    Years: 25.00    Total pack years: 12.50    Types: Cigarettes    Quit date: 07/06/2014    Years since quitting: 8.2   Smokeless tobacco: Never  Vaping Use   Vaping Use: Never used  Substance Use Topics   Alcohol use: No    Alcohol/week: 0.0 standard drinks of alcohol   Drug use: No    Allergies  Allergen Reactions   Penicillins Hives and Other (See Comments)    Has  patient had a PCN reaction causing immediate rash, facial/tongue/throat swelling, SOB or lightheadedness with hypotension: No Has patient had a PCN reaction causing severe rash involving mucus membranes or skin necrosis: No Has patient had a PCN reaction that required hospitalization: No Has patient had a PCN reaction occurring within the last 10 years: No If all of the above answers are "NO", then may proceed with Cephalosporin use.    Current Facility-Administered Medications  Medication Dose Route Frequency Provider Last Rate Last Admin   0.9 %  sodium chloride infusion   Intravenous PRN Flora Lipps, MD 10 mL/hr at 09/24/22 0700 Infusion Verify at 09/24/22 0700   acetaminophen (TYLENOL) tablet 650 mg  650 mg Oral Q4H PRN Bradly Bienenstock, NP       amLODipine (NORVASC) tablet 5 mg  5 mg Oral Daily Teressa Lower, NP   5 mg at 09/23/22 1404   atorvastatin (LIPITOR) tablet 40 mg  40 mg Oral QPM Darel Hong D, NP   40 mg at 09/23/22 1701   azithromycin (ZITHROMAX) 500 mg in sodium chloride 0.9 % 250 mL IVPB  500 mg Intravenous Q24H Darel Hong D, NP 250 mL/hr at 09/24/22 1211 500 mg at 09/24/22 1211   benzonatate (TESSALON) capsule 200 mg  200 mg Oral BID PRN Bradly Bienenstock, NP   200  mg at 09/22/22 2245   [START ON 09/25/2022] bisacodyl (DULCOLAX) suppository 10 mg  10 mg Rectal Daily Teressa Lower, NP       budesonide (PULMICORT) nebulizer solution 0.5 mg  0.5 mg Nebulization BID Darel Hong D, NP   0.5 mg at 09/24/22 3474   ceFEPIme (MAXIPIME) 2 g in sodium chloride 0.9 % 100 mL IVPB  2 g Intravenous BID CoulterHoyle Sauer, RPH 200 mL/hr at 09/24/22 0825 2 g at 09/24/22 0825   Chlorhexidine Gluconate Cloth 2 % PADS 6 each  6 each Topical Q0600 Bradly Bienenstock, NP   6 each at 09/23/22 2134   dextrose 50 % solution 0-50 mL  0-50 mL Intravenous PRN Rust-Chester, Huel Cote, NP       docusate sodium (COLACE) capsule 100 mg  100 mg Oral BID PRN Bradly Bienenstock, NP       enoxaparin  (LOVENOX) injection 40 mg  40 mg Subcutaneous QHS Flora Lipps, MD   40 mg at 09/23/22 2106   guaiFENesin (MUCINEX) 12 hr tablet 600 mg  600 mg Oral BID PRN Flora Lipps, MD   600 mg at 09/22/22 0932   hydrALAZINE (APRESOLINE) injection 10-20 mg  10-20 mg Intravenous Q4H PRN Flora Lipps, MD       insulin aspart (novoLOG) injection 0-20 Units  0-20 Units Subcutaneous TID WC Teressa Lower, NP   3 Units at 09/24/22 1212   insulin aspart (novoLOG) injection 0-5 Units  0-5 Units Subcutaneous QHS Teressa Lower, NP       insulin detemir (LEVEMIR) injection 21 Units  0.3 Units/kg Subcutaneous Q24H Rust-Chester, Britton L, NP   21 Units at 09/24/22 0359   ipratropium-albuterol (DUONEB) 0.5-2.5 (3) MG/3ML nebulizer solution 3 mL  3 mL Nebulization Q6H Flora Lipps, MD   3 mL at 09/24/22 1335   methylPREDNISolone sodium succinate (SOLU-MEDROL) 40 mg/mL injection 40 mg  40 mg Intravenous Daily Darel Hong D, NP   40 mg at 09/24/22 1016   morphine (PF) 2 MG/ML injection 1-2 mg  1-2 mg Intravenous Q4H PRN Bradly Bienenstock, NP   2 mg at 09/23/22 1847   nicardipine (CARDENE) 20mg  in 0.86% saline 23ml IV infusion (0.1 mg/ml)  3-15 mg/hr Intravenous Continuous Teressa Lower, NP   Stopped at 09/24/22 1406   ondansetron (ZOFRAN) injection 4 mg  4 mg Intravenous Q6H PRN Bradly Bienenstock, NP   4 mg at 09/24/22 2595   Oral care mouth rinse  15 mL Mouth Rinse 4 times per day Bradly Bienenstock, NP   15 mL at 09/24/22 1155   Oral care mouth rinse  15 mL Mouth Rinse PRN Bradly Bienenstock, NP       polyethylene glycol (MIRALAX / GLYCOLAX) packet 17 g  17 g Oral Daily PRN Darel Hong D, NP       sodium chloride flush (NS) 0.9 % injection 10-40 mL  10-40 mL Intracatheter Q12H Flora Lipps, MD   10 mL at 09/24/22 1155   sodium chloride flush (NS) 0.9 % injection 10-40 mL  10-40 mL Intracatheter PRN Flora Lipps, MD         Review of Systems Full ROS  was asked and was negative except for the information on the  HPI  Physical Exam Blood pressure 137/77, pulse (!) 122, temperature 97.7 F (36.5 C), temperature source Oral, resp. rate 17, height 5\' 3"  (1.6 m), weight 71.6 kg, SpO2 100 %. CONSTITUTIONAL: chronically ill. EYES: Pupils are  equal, round, and reactive to light, Sclera are non-icteric. EARS, NOSE, MOUTH AND THROAT: The oropharynx is clear. The oral mucosa is pink and moist. Hearing is intact to voice. LYMPH NODES:  Lymph nodes in the neck are normal. RESPIRATORY:  Lungs are decrease bs bilaterally, tachypneic and in obvious resp distress.  CARDIOVASCULAR: Heart is regular without murmurs, gallops, or rubs. GI: The abdomen is  soft, mildly distended, no peritonitis  There are no palpable masses. There is no hepatosplenomegaly. There are decrease bowel sounds  GU: Rectal deferred.   MUSCULOSKELETAL: Normal muscle strength and tone. No cyanosis or edema.   SKIN: Turgor is good and there are no pathologic skin lesions or ulcers. NEUROLOGIC: Motor and sensation is grossly normal. Cranial nerves are grossly intact. PSYCH:  Oriented to person, place and time. Affect is normal.  Data Reviewed  I have personally reviewed the patient's imaging, laboratory findings and medical records.    Assessment/Plan Ileus likely from acute on chronic medical condition.  At this time she feels better.  No need for any surgical intervention at this time.  He is she is already having bowel movements and I think that the ileus is slowly resolving.  Recommend optimization of medical issues and electrolytes.  May do serial abdominal exams.  May hold off on NG tube. Gust with critical care team.  We will follow her on a as needed basis. Please note that I spent 75 minutes in this encounter including personally reviewing all imaging studies, coordinating her care, placing orders and performing appropriate documentation   Sterling Big, MD FACS General Surgeon 09/24/2022, 4:42 PM

## 2022-09-24 NOTE — Progress Notes (Signed)
Progress Note  Updated pts husband via telephone regarding pts condition and current plan of care all questions answered.  Will continue to monitor and assess pt   Donell Beers, [place] Pager [phone] (please enter 7 digits) PCCM Consult Pager [phone] (please enter 7 digits)

## 2022-09-25 ENCOUNTER — Inpatient Hospital Stay: Payer: Medicare Other

## 2022-09-25 DIAGNOSIS — E119 Type 2 diabetes mellitus without complications: Secondary | ICD-10-CM

## 2022-09-25 DIAGNOSIS — Z9981 Dependence on supplemental oxygen: Secondary | ICD-10-CM

## 2022-09-25 DIAGNOSIS — I723 Aneurysm of iliac artery: Secondary | ICD-10-CM | POA: Diagnosis not present

## 2022-09-25 DIAGNOSIS — I745 Embolism and thrombosis of iliac artery: Secondary | ICD-10-CM | POA: Diagnosis not present

## 2022-09-25 DIAGNOSIS — J189 Pneumonia, unspecified organism: Secondary | ICD-10-CM | POA: Diagnosis not present

## 2022-09-25 DIAGNOSIS — K219 Gastro-esophageal reflux disease without esophagitis: Secondary | ICD-10-CM

## 2022-09-25 LAB — COMPREHENSIVE METABOLIC PANEL
ALT: 52 U/L — ABNORMAL HIGH (ref 0–44)
AST: 34 U/L (ref 15–41)
Albumin: 2.1 g/dL — ABNORMAL LOW (ref 3.5–5.0)
Alkaline Phosphatase: 92 U/L (ref 38–126)
Anion gap: 9 (ref 5–15)
BUN: 37 mg/dL — ABNORMAL HIGH (ref 8–23)
CO2: 23 mmol/L (ref 22–32)
Calcium: 10.2 mg/dL (ref 8.9–10.3)
Chloride: 111 mmol/L (ref 98–111)
Creatinine, Ser: 0.89 mg/dL (ref 0.44–1.00)
GFR, Estimated: >60 mL/min (ref >60)
Glucose, Bld: 141 mg/dL — ABNORMAL HIGH (ref 70–99)
Potassium: 4 mmol/L (ref 3.5–5.1)
Sodium: 143 mmol/L (ref 135–145)
Total Bilirubin: 0.8 mg/dL (ref 0.3–1.2)
Total Protein: 8 g/dL (ref 6.5–8.1)

## 2022-09-25 LAB — GLUCOSE, CAPILLARY
Glucose-Capillary: 140 mg/dL — ABNORMAL HIGH (ref 70–99)
Glucose-Capillary: 145 mg/dL — ABNORMAL HIGH (ref 70–99)
Glucose-Capillary: 151 mg/dL — ABNORMAL HIGH (ref 70–99)
Glucose-Capillary: 200 mg/dL — ABNORMAL HIGH (ref 70–99)
Glucose-Capillary: 237 mg/dL — ABNORMAL HIGH (ref 70–99)
Glucose-Capillary: 95 mg/dL (ref 70–99)

## 2022-09-25 LAB — CBC
HCT: 34.5 % — ABNORMAL LOW (ref 36.0–46.0)
Hemoglobin: 10.9 g/dL — ABNORMAL LOW (ref 12.0–15.0)
MCH: 28.5 pg (ref 26.0–34.0)
MCHC: 31.6 g/dL (ref 30.0–36.0)
MCV: 90.1 fL (ref 80.0–100.0)
Platelets: 301 10*3/uL (ref 150–400)
RBC: 3.83 MIL/uL — ABNORMAL LOW (ref 3.87–5.11)
RDW: 15.5 % (ref 11.5–15.5)
WBC: 15.9 10*3/uL — ABNORMAL HIGH (ref 4.0–10.5)
nRBC: 0 % (ref 0.0–0.2)

## 2022-09-25 LAB — BASIC METABOLIC PANEL
Anion gap: 7 (ref 5–15)
BUN: 39 mg/dL — ABNORMAL HIGH (ref 8–23)
CO2: 26 mmol/L (ref 22–32)
Calcium: 10.2 mg/dL (ref 8.9–10.3)
Chloride: 111 mmol/L (ref 98–111)
Creatinine, Ser: 0.85 mg/dL (ref 0.44–1.00)
GFR, Estimated: >60 mL/min (ref >60)
Glucose, Bld: 153 mg/dL — ABNORMAL HIGH (ref 70–99)
Potassium: 3.8 mmol/L (ref 3.5–5.1)
Sodium: 144 mmol/L (ref 135–145)

## 2022-09-25 LAB — PHOSPHORUS: Phosphorus: 3.4 mg/dL (ref 2.5–4.6)

## 2022-09-25 LAB — LEGIONELLA PNEUMOPHILA SEROGP 1 UR AG: L. pneumophila Serogp 1 Ur Ag: NEGATIVE

## 2022-09-25 LAB — MAGNESIUM: Magnesium: 2.1 mg/dL (ref 1.7–2.4)

## 2022-09-25 LAB — MYCOPLASMA PNEUMONIAE ANTIBODY, IGM: Mycoplasma pneumo IgM: <770 U/mL (ref 0–769)

## 2022-09-25 MED ORDER — LABETALOL HCL 5 MG/ML IV SOLN
10.0000 mg | INTRAVENOUS | Status: DC | PRN
  Administered 2022-09-25 – 2022-09-27 (×5): 10 mg via INTRAVENOUS
  Filled 2022-09-25 (×4): qty 4

## 2022-09-25 MED ORDER — TRACE MINERALS CU-MN-SE-ZN 300-55-60-3000 MCG/ML IV SOLN
INTRAVENOUS | Status: AC
  Filled 2022-09-25: qty 192

## 2022-09-25 MED ORDER — SODIUM CHLORIDE 0.9 % IV SOLN
12.5000 mg | Freq: Four times a day (QID) | INTRAVENOUS | Status: DC | PRN
  Administered 2022-09-25: 12.5 mg via INTRAVENOUS
  Filled 2022-09-25: qty 12.5
  Filled 2022-09-25: qty 0.5

## 2022-09-25 MED ORDER — INSULIN ASPART 100 UNIT/ML IJ SOLN
0.0000 [IU] | Freq: Four times a day (QID) | INTRAMUSCULAR | Status: DC
  Administered 2022-09-25: 3 [IU] via SUBCUTANEOUS
  Administered 2022-09-25 – 2022-09-26 (×2): 7 [IU] via SUBCUTANEOUS
  Filled 2022-09-25 (×3): qty 1

## 2022-09-25 MED ORDER — METHYLPREDNISOLONE SODIUM SUCC 40 MG IJ SOLR
20.0000 mg | Freq: Two times a day (BID) | INTRAMUSCULAR | Status: DC
  Administered 2022-09-25 – 2022-09-26 (×2): 20 mg via INTRAVENOUS
  Filled 2022-09-25 (×2): qty 1

## 2022-09-25 NOTE — Progress Notes (Signed)
NAME:  Elicia Grapes, MRN:  [id number], DOB:  1950-12-20, LOS: 4 ADMISSION DATE:  09/21/2022, CONSULTATION DATE:  09/21/2021 REFERRING MD:  Dr. Starleen Blue, CHIEF COMPLAINT:  Weakness s/p falls, shortness of breath, cough   Brief Pt Description / Synopsis:  72 y.o. female, DNR/DNI, with PMHx significant for COPD on 4L supplemental O2 admitted with Acute on Chronic Hypoxic Respiratory Failure and severe sepsis in the setting of suspected Multifocal Pneumonia and AECOPD requiring BiPAP.  Also found to have AKI and concern for mild DKA.  History of Present Illness:  Chiyeko Kempa is a 71 year old female with a past medical history significant for chronic hypoxic respiratory failure requiring 4 L supplemental oxygen due to COPD, diabetes mellitus, hypertension, GERD who presents to [facility] ED on 09/21/2022 due to generalized weakness status post multiple falls, dyspnea, and cough.  Patient is currently on BiPAP with increased work of breathing, therefore history is obtained from the patient's husband at bedside along with chart review.  Her husband reports that last Saturday she began to have cough and shortness of breath, which have worsened since then.  She is also had generalized weakness status post 2 falls, poor p.o. intake, and dysuria.  He denies any knowledge that she has had any chest pain, dizziness, abdominal pain, nausea, vomiting, diarrhea, fever, chills.  ED Course: Initial Vital Signs: Temperature 98 Fahrenheit orally, pulse 133, respiratory rate 22, blood pressure 109/75, SpO2 92% Significant Labs: Sodium 127, potassium 5.3, chloride 93, bicarb 20, anion gap 14, albumin 2.7, glucose 10/10/1987, BUN 42, creatinine 1.89, AST 53, ALT 55, total bili 1.4, BNP 110, high-sensitivity troponin 31, WBC 16.3 with neutrophilia, lactic acid 2.7, INR 1.3, PT 15.6 VBG on BiPAP: pH 7.17/pCO2 58/pO2/bicarb 21.2 EKG EKG shows sinus tach, RAD, somewhat peaked T waves Imaging Chest  X-ray>>IMPRESSION: 1. New patchy right greater than left perihilar opacities and pulmonary venous congestion. Differential considerations include asymmetric edema versus infection or aspiration. 2.  Aortic Atherosclerosis (ICD10-I70.0). CT head>>IMPRESSION: 1. No acute intracranial abnormality. 2. Chronic small vessel ischemic disease. CT cervical spine>>IMPRESSION: 1. Mildly motion degraded exam. 2. No evidence of acute fracture to the cervical spine. 3. 2-3 mm grade 1 anterolisthesis at C4-C5. 4. Cervical spondylosis, as described. Multilevel spinal canal stenosis. Most notably, a central disc protrusion contributes to at least moderate spinal canal stenosis at C3-C4. Multilevel bony neural foraminal narrowing also present within the cervical spine. A cervical spine MRI may be obtained for further evaluation, as clinically warranted. 5.  Aortic Atherosclerosis (ICD10-I70.0). Medications Administered: 3 L IV fluid boluses, IV's azithromycin, cefepime, vancomycin, 60 mg IV Solu-Medrol, multiple DuoNebs  On arrival to the ED she was tachycardic in the 150s and tachypneic, but initially saturating okay on her baseline 4 L.  She was noted to be pursed lip breathing with expiratory wheezing. Given her work of breathing, patient was placed on BiPAP.  Patient is very adamant that she would not want to be intubated or have CPR or ACLS performed.  PCCM asked to admit for further workup and treatment.  Please see "significant hospital events" section below for full detailed hospital course.  Pertinent  Medical History   Past Medical History:  Diagnosis Date   COPD (chronic obstructive pulmonary disease) ([place])    Diabetes mellitus without complication (HCC)    GERD (gastroesophageal reflux disease)    Hypertension     Micro Data:  1/18: SARS-CoV-2/RSV/influenza PCR>> negative 1/18: Blood culture x 2>> NGTD 1/18: Urine>>less than 10,000 colonies/ml insignificant growth  1/18: Respiratory  viral panel>> negative 1/18: Strep pneumo and Legionella urinary antigens>> negative 1/18: MRSA PCR>>negative  Antimicrobials:  Azithromycin 1/18>> Cefepime 1/18>> Vancomycin 1/18>>1/19  Significant Hospital Events: Including procedures, antibiotic start and stop dates in addition to other pertinent events   1/18: Presents to [facility] ED, with severe respiratory distress requiring BiPAP.  Patient adamant she would want to be DNR/DNI.  PCCM asked to admit 1/19: Weaned off BiPAP to baseline 4L [place].  DKI resolved, AKI improving.  PT/OT consulted.  Vancomycin d/c as MRSA PCR is negative 1/19: CTA Chest>>No evidence of significant pulmonary embolus. Dense        consolidation in the right lung likely representing pneumonia. Emphysematous        changes in the lungs.  Aortic atherosclerosis. 1/20: Remains off Bipap tolerating 4L via nasal canula; will transfer service to [facility]  1/20: Pts respiratory status declined last night requiring Bipap.  Pt also severely hypertensive sbp 200's/100's and tachycardic hr 150's nicardipine gtt initiated 1/21: Pt with projectile vomiting and abdominal distension Bipap removed due to concerns of aspiration.  CT Abd Pelvis revealed small bowel obstruction General Surgery consulted   1/22: Off BiPAP, on 6L HFNC.  KUB continues to show SBO, but did have BM yesterday, no complaints of abdominal pain/N/V/D today. Continue with bowel rest and monitoring serial abdominal exam/KUB's.  Interim History / Subjective:  -No significant events noted overnight -Afebrile, hemodynamically stable, no vasopressors -On 6L HFNC (baseline 4L ) -Reported that pt had BM yesterday evening -This morning is awake and alert, pleasantly confused ~ she reports some SOB and dry cough -Denies abdominal pain, N/V/D -KUB this morning still showing SBO ~ continue to follow abdominal exam and serial KUB's, General Surgery following -Abdomen slightly distended, but no tenderness/guarding to suggest  acute abdomen   Objective   Blood pressure (!) 125/105, pulse (!) 119, temperature 97.8 F (36.6 C), temperature source Oral, resp. rate 13, height 5\' 3"  (1.6 m), weight 69.4 kg, SpO2 94 %.        Intake/Output Summary (Last 24 hours) at 09/25/2022 0850 Last data filed at 09/25/2022 0800 Gross per 24 hour  Intake 924.81 ml  Output 2275 ml  Net -1350.19 ml    Filed Weights   09/23/22 0500 09/24/22 0400 09/25/22 0500  Weight: 74 kg 71.6 kg 69.4 kg    Examination: General: Acute on chronically ill-appearing female, laying in bed, in NAD, on 6L O2 via nasal canula HENT: Atraumatic, normocephalic, neck supple, no JVD Lungs: Faint wheezing, throughout, even, non labored  Cardiovascular: Sinus tachycardia, s1-s2, no m/r/g, 2+ radial/2+ distal pulses, no edema  Abdomen: BS hypoactive x4, soft, non tender, mild distension  Extremities: Normal bulk and tone, generalized weakness, no deformities Neuro: Awake confused to situation and time, follows commands no focal deficits GU: External catheter in place draining yellow urine  Resolved Hospital Problem list   Mild DKA   Assessment & Plan:   #Acute on Chronic Hypoxic Respiratory Failure in the setting of AECOPD & suspected community-acquired pneumonia PMHx: COPD on 4L supplemental O2 at baseline - Supplemental O2 as needed to maintain O2 sats 88 to 92% - Bipap as need for dyspnea and/or hypercapnia  - Follow intermittent Chest X-ray & ABG as needed - Bronchodilators & Pulmicort nebs - IV Steroids wean as tolerated  - ABX as above - Diuresis as BP and renal function permits - Pulmonary toilet as able - Prn morphine for air hunger  #Severe Sepsis due to suspected community-acquired  Pneumonia (met SIRS Criteria at admission: HR >90, RR >20, WBC 16.3) #HTN - Trend WBC and monitor fever curve  - Trend PCT  - Continue empiric azithromycin and cefepime pending cultures & sensitivities  #Small bowel obstruction  - General Surgery  following, appreciate input  - Keep NPO for bowel rest  - Serial abdominal exams and KUB's - Pt was unable to tolerate NGT placement  #Acute Kidney Injury ~ RESOLVED #Hypertonic Hyponatremia in setting of severe hyperglycemia (corrected Na for glucose is Na 135) ~ RESOLVED #Mild Hyperkalemia ~ RESOLVED #Anion Gap Metabolic Acidosis ~ RESOLVED - Trend BMP  - Replace electrolytes as indicated  - Monitor UOP - Avoid nephrotoxic medications   #Diabetes Mellitus Type II -CBG's q4h; Target range of 140 to 180 -SSI and scheduled levemir  -Follow ICU Hypo/Hyperglycemia protocol  Best Practice (right click and "Reselect all SmartList Selections" daily)   Diet/type: NPO  DVT prophylaxis: prophylactic heparin  GI prophylaxis: PPI Lines: Right arm PICC (yes due to limited Peripheral access) Foley:  N/A Code Status:  DNR Last date of multidisciplinary goals of care discussion [09/25/2021]  1/21: Pt updated at bedside, will update her husband when he arrives at bedside  Labs   CBC: Recent Labs  Lab 09/21/22 1341 09/22/22 0500 09/23/22 0506 09/24/22 0349 09/25/22 0445  WBC 16.3* 13.8* 13.8* 15.6* 15.9*  NEUTROABS 14.7*  --   --   --   --   HGB 13.2 9.5* 9.5* 11.4* 10.9*  HCT 41.7 30.1* 30.6* 36.1 34.5*  MCV 91.0 91.2 91.6 89.6 90.1  PLT 255 188 205 299 301     Basic Metabolic Panel: Recent Labs  Lab 09/21/22 1341 09/21/22 1903 09/21/22 2349 09/22/22 0500 09/23/22 0506 09/24/22 0349 09/25/22 0445  NA 127*  127*   < > 137 137 134* 139 144  K 5.4*  5.3*   < > 4.4 4.1 4.0 4.3 3.8  CL 94*  93*   < > 112* 111 107 105 111  CO2 19*  20*   < > 18* 24 21* 23 26  GLUCOSE 574*  589*   < > 191* 194* 187* 230* 153*  BUN 41*  42*   < > 31* 28* 38* 39* 39*  CREATININE 1.88*  1.89*   < > 1.16* 1.06* 1.02* 1.01* 0.85  CALCIUM 9.3  9.5   < > 8.7* 8.7* 9.5 10.2 10.2  MG 2.5*  --   --  2.1  --  2.2 2.1  PHOS  --   --   --  3.2 2.6 3.9 3.4   < > = values in this interval not  displayed.    GFR: Estimated Creatinine Clearance: 56.7 mL/min (by C-G formula based on SCr of 0.85 mg/dL). Recent Labs  Lab 09/21/22 1341 09/21/22 1649 09/21/22 2349 09/22/22 0500 09/23/22 0506 09/24/22 0349 09/25/22 0445  PROCALCITON 13.55  --   --  11.94 7.84  --   --   WBC 16.3*  --   --  13.8* 13.8* 15.6* 15.9*  LATICACIDVEN 2.7* 3.9* 2.4* 1.8  --   --   --      Liver Function Tests: Recent Labs  Lab 09/21/22 1341 09/22/22 0500 09/23/22 0506  AST 53* 34  --   ALT 55* 36  --   ALKPHOS 120 84  --   BILITOT 1.4* 0.5  --   PROT 9.1* 6.8  --   ALBUMIN 2.7* 1.8*  1.9* 1.8*    No results for  input(s): "LIPASE", "AMYLASE" in the last 168 hours. No results for input(s): "AMMONIA" in the last 168 hours.  ABG    Component Value Date/Time   PHART 7.33 (L) 09/02/2021 2117   PCO2ART 51 (H) 09/02/2021 2117   PO2ART 249 (H) 09/02/2021 2117   HCO3 19.2 (L) 09/21/2022 2349   ACIDBASEDEF 4.5 (H) 09/21/2022 2349   O2SAT 99.8 09/21/2022 2349     Coagulation Profile: Recent Labs  Lab 09/21/22 1341  INR 1.3*     Cardiac Enzymes: Recent Labs  Lab 09/21/22 1341  CKTOTAL 68     HbA1C: Hgb A1c MFr Bld  Date/Time Value Ref Range Status  09/22/2022 05:00 AM 9.0 (H) 4.8 - 5.6 % Final    Comment:    (NOTE) Pre diabetes:          5.7%-6.4%  Diabetes:              >6.4%  Glycemic control for   <7.0% adults with diabetes   06/04/2022 03:37 PM 8.5 (H) 4.8 - 5.6 % Final    Comment:    (NOTE) Pre diabetes:          5.7%-6.4%  Diabetes:              >6.4%  Glycemic control for   <7.0% adults with diabetes     CBG: Recent Labs  Lab 09/24/22 1159 09/24/22 1624 09/24/22 2134 09/25/22 0355 09/25/22 0734  GLUCAP 146* 123* 127* 145* 151*     Review of Systems:   Positives in BOLD: Gen: Denies fever, chills, weight change, fatigue, night sweats HEENT: Denies blurred vision, double vision, hearing loss, tinnitus, sinus congestion, rhinorrhea, sore  throat, neck stiffness, dysphagia PULM: shortness of breath, cough, sputum production, hemoptysis, wheezing CV: Denies chest pain, edema, orthopnea, paroxysmal nocturnal dyspnea, palpitations GI: abdominal pain, nausea, vomiting, diarrhea, hematochezia, melena, constipation, change in bowel habits GU: Denies dysuria, hematuria, polyuria, oliguria, urethral discharge Endocrine: Denies hot or cold intolerance, polyuria, polyphagia or appetite change Derm: Denies rash, dry skin, scaling or peeling skin change Heme: Denies easy bruising, bleeding, bleeding gums Neuro: Denies headache, numbness, weakness, slurred speech, loss of memory or consciousness  Past Medical History:  She,  has a past medical history of COPD (chronic obstructive pulmonary disease) (HCC), Diabetes mellitus without complication (HCC), GERD (gastroesophageal reflux disease), and Hypertension.   Surgical History:   Past Surgical History:  Procedure Laterality Date   ABDOMINAL HYSTERECTOMY     COLONOSCOPY WITH PROPOFOL N/A 01/23/2017   Procedure: COLONOSCOPY WITH PROPOFOL;  Surgeon: Christena Deem, MD;  Location: [facility] ENDOSCOPY;  Service: Endoscopy;  Laterality: N/A;   FOOT SURGERY       Social History:   reports that she quit smoking about 8 years ago. Her smoking use included cigarettes. She has a 12.50 pack-year smoking history. She has never used smokeless tobacco. She reports that she does not drink alcohol and does not use drugs.   Family History:  Her family history includes CAD in her father and mother. There is no history of Breast cancer.   Allergies Allergies  Allergen Reactions   Penicillins Hives and Other (See Comments)    Has patient had a PCN reaction causing immediate rash, facial/tongue/throat swelling, SOB or lightheadedness with hypotension: No Has patient had a PCN reaction causing severe rash involving mucus membranes or skin necrosis: No Has patient had a PCN reaction that required  hospitalization: No Has patient had a PCN reaction occurring within the last 10 years:  No If all of the above answers are "NO", then may proceed with Cephalosporin use.     Home Medications  Prior to Admission medications   Medication Sig Start Date End Date Taking? Authorizing Provider  celecoxib (CELEBREX) 100 MG capsule Take 100 mg by mouth daily. 08/10/22  Yes [provider]  fluticasone (FLONASE) 50 MCG/ACT nasal spray Place 1 spray into both nostrils daily. 09/06/22  Yes [provider]  latanoprost (XALATAN) 0.005 % ophthalmic solution Place 1 drop into both eyes 2 (two) times daily. 08/04/22  Yes [provider]  losartan (COZAAR) 100 MG tablet Take 100 mg by mouth daily. 08/24/22  Yes [provider]  ondansetron (ZOFRAN) 4 MG tablet Take 4 mg by mouth every 8 (eight) hours as needed for nausea or vomiting. 07/04/22  Yes [provider]  TRULICITY 6.28 ZM/6.2HU SOPN Inject 0.75 mg into the skin once a week. 06/28/22  Yes [provider]  albuterol (VENTOLIN HFA) 108 (90 Base) MCG/ACT inhaler Inhale 2 puffs into the lungs every 4 (four) hours as needed for wheezing or shortness of breath.    [provider]  amLODipine (NORVASC) 5 MG tablet Take 1 tablet (5 mg total) by mouth daily. 09/05/21   Hosie Poisson, MD  aspirin EC 81 MG tablet Take 81 mg by mouth daily. 03/15/22   [provider]  atorvastatin (LIPITOR) 40 MG tablet Take 1 tablet (40 mg total) by mouth every evening. 09/05/21   Hosie Poisson, MD  benzonatate (TESSALON) 100 MG capsule Take 2 capsules (200 mg total) by mouth every 8 (eight) hours. 05/28/22   Margarette Canada, NP  budesonide-formoterol [facility]) 160-4.5 MCG/ACT inhaler Inhale 2 puffs into the lungs 2 (two) times daily.    [provider]  Dextromethorphan-guaiFENesin (MUCINEX DM) 30-600 MG TB12 SMARTSIG:1-2 Tablet(s) By Mouth Every 12 Hours PRN 04/28/22   [provider]   guaiFENesin-codeine (ROBITUSSIN AC) 100-10 MG/5ML syrup Take 5 mLs by mouth 3 (three) times daily as needed for cough. 05/28/22   Margarette Canada, NP  ipratropium (ATROVENT) 0.06 % nasal spray Place 2 sprays into both nostrils 4 (four) times daily. 05/28/22   Margarette Canada, NP  ipratropium-albuterol (DUONEB) 0.5-2.5 (3) MG/3ML SOLN Take 3 mLs by nebulization every 4 (four) hours as needed. Patient taking differently: Take 3 mLs by nebulization every 4 (four) hours as needed. Last dose: 730 am. 09/05/21   Hosie Poisson, MD  JARDIANCE 10 MG TABS tablet Take 10 mg by mouth daily. 09/07/21   [provider]  metFORMIN (GLUCOPHAGE-XR) 500 MG 24 hr tablet Take 500 mg by mouth daily with breakfast.    [provider]  methocarbamol (ROBAXIN) 500 MG tablet Take 500 mg by mouth every 6 (six) hours as needed for muscle spasms. 06/21/21   [provider]  MIRALAX 17 g packet Take 17 g by mouth daily as needed for mild constipation. 06/23/21   [provider]  ondansetron (ZOFRAN-ODT) 4 MG disintegrating tablet Take 1 tablet (4 mg total) by mouth every 8 (eight) hours as needed. 09/01/22   Brimage, Vondra, DO  OZEMPIC, 0.25 OR 0.5 MG/DOSE, 2 MG/3ML SOPN Inject into the skin. 04/26/22   [provider]  pantoprazole (PROTONIX) 40 MG tablet Take 1 tablet (40 mg total) by mouth daily. 06/11/21   Coral Spikes, DO  Respiratory Therapy Supplies (ADULT MASK LARGE) MISC See admin instructions. use with inhaler 10/17/21   [provider]  Spacer/Aero-Holding Chambers (AEROCHAMBER PLUS) inhaler Use with  inhaler 10/15/21   Melynda Ripple, MD  tiotropium (SPIRIVA HANDIHALER) 18 MCG inhalation capsule Place 1 capsule (18 mcg total) into inhaler and inhale daily. Patient not taking: Reported on 06/05/2022 09/05/21   Hosie Poisson, MD  [place] 18 MG/3ML SOPN     [provider]  VOLTAREN ARTHRITIS PAIN 1 % GEL Apply 2 g topically 4 (four) times daily. 03/22/22   [provider]  XOPENEX HFA 45 MCG/ACT inhaler Inhale 1 puff into the lungs 3 (three) times daily. 11/02/21   [provider]     Critical Care time: 40 minutes    Darel Hong, AGACNP-BC [facility] epic messenger for cross cover needs If after hours, please call E-link

## 2022-09-25 NOTE — Consult Note (Signed)
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 female with a past medical history significant for chronic hypoxic respiratory failure requiring 4 L supplemental oxygen due to COPD, diabetes mellitus, hypertension, GERD who presents to [facility] ED on 09/21/2022 due to generalized weakness status post multiple falls, dyspnea, and cough.     Past Medical History:  Diagnosis Date   COPD (chronic obstructive pulmonary disease) ([place])    Diabetes mellitus without complication (HCC)    GERD (gastroesophageal reflux disease)    Hypertension     Past Surgical History:  Procedure Laterality Date   ABDOMINAL HYSTERECTOMY     COLONOSCOPY WITH PROPOFOL N/A 01/23/2017   Procedure: COLONOSCOPY WITH PROPOFOL;  Surgeon: Lollie Sails, MD;  Location: [facility] ENDOSCOPY;  Service: Endoscopy;  Laterality: N/A;   FOOT SURGERY      Allergies  Allergen Reactions   Penicillins Hives and Other (See Comments)    Has patient had a PCN reaction causing immediate rash, facial/tongue/throat swelling, SOB or lightheadedness with hypotension: No Has patient had a PCN reaction causing severe rash involving mucus membranes or skin necrosis: No Has patient had a PCN reaction that required hospitalization: No Has patient had a PCN reaction occurring within the last 10 years: No If all of the above answers are "NO", then may proceed with Cephalosporin use.    Prior to Admission medications   Medication Sig Start Date End Date Taking? Authorizing Provider  albuterol (VENTOLIN HFA) 108 (90 Base) MCG/ACT inhaler Inhale 2 puffs into the lungs every 4 (four) hours as needed for wheezing or shortness of breath.   Yes [provider]  amLODipine (NORVASC) 5 MG tablet Take 1 tablet (5 mg total) by mouth daily. 09/05/21  Yes Hosie Poisson, MD  aspirin EC 81 MG tablet Take 81 mg by mouth  daily. 03/15/22  Yes [provider]  atorvastatin (LIPITOR) 40 MG tablet Take 1 tablet (40 mg total) by mouth every evening. 09/05/21  Yes Hosie Poisson, MD  benzonatate (TESSALON) 100 MG capsule Take 2 capsules (200 mg total) by mouth every 8 (eight) hours. 05/28/22  Yes Margarette Canada, NP  budesonide-formoterol [facility]) 160-4.5 MCG/ACT inhaler Inhale 2 puffs into the lungs 2 (two) times daily.   Yes [provider]  celecoxib (CELEBREX) 100 MG capsule Take 100 mg by mouth daily. 08/10/22  Yes [provider]  Dextromethorphan-guaiFENesin (MUCINEX DM) 30-600 MG TB12 SMARTSIG:1-2 Tablet(s) By Mouth Every 12 Hours PRN 04/28/22  Yes [provider]  fluticasone (FLONASE) 50 MCG/ACT nasal spray Place 1 spray into both nostrils daily. 09/06/22  Yes [provider]  guaiFENesin-codeine (ROBITUSSIN AC) 100-10 MG/5ML syrup Take 5 mLs by mouth 3 (three) times daily as needed for cough. 05/28/22  Yes Margarette Canada, NP  ipratropium (ATROVENT) 0.06 % nasal spray Place 2 sprays into both nostrils 4 (four) times daily. 05/28/22  Yes Margarette Canada, NP  JARDIANCE 10 MG TABS tablet Take 10 mg by mouth daily. 09/07/21  Yes [provider]  latanoprost (XALATAN) 0.005 % ophthalmic solution Place 1 drop into both eyes 2 (two) times daily. 08/04/22  Yes [provider]  losartan (COZAAR) 100 MG tablet Take 100 mg by mouth daily. 08/24/22  Yes [provider]  metFORMIN (GLUCOPHAGE-XR) 500 MG 24 hr tablet Take 500 mg by mouth daily with breakfast.   Yes [provider]  methocarbamol (ROBAXIN) 500 MG tablet Take 500 mg by mouth every 6 (six) hours as needed for muscle spasms. 06/21/21  Yes [provider]  ondansetron (ZOFRAN) 4 MG tablet Take 4 mg by mouth every 8 (eight) hours as needed for nausea or vomiting. 07/04/22  Yes [provider]  ondansetron (ZOFRAN-ODT) 4 MG disintegrating tablet Take 1 tablet (4 mg total) by mouth every 8  (eight) hours as needed. 09/01/22  Yes Brimage, Vondra, DO  VICTOZA 18 MG/3ML SOPN Inject 0.6 mg into the skin daily.   Yes [provider]  VOLTAREN ARTHRITIS PAIN 1 % GEL Apply 2 g topically 4 (four) times daily. 03/22/22  Yes [provider]  XOPENEX HFA 45 MCG/ACT inhaler Inhale 1 puff into the lungs 3 (three) times daily. 11/02/21  Yes [provider]  ipratropium-albuterol (DUONEB) 0.5-2.5 (3) MG/3ML SOLN Take 3 mLs by nebulization every 4 (four) hours as needed. Patient taking differently: Take 3 mLs by nebulization every 4 (four) hours as needed. Last dose: 730 am. 09/05/21   Kathlen Mody, MD  MIRALAX 17 g packet Take 17 g by mouth daily as needed for mild constipation. Patient not taking: Reported on 09/21/2022 06/23/21   [provider]  OZEMPIC, 0.25 OR 0.5 MG/DOSE, 2 MG/3ML SOPN Inject into the skin. 04/26/22   [provider]  pantoprazole (PROTONIX) 40 MG tablet Take 1 tablet (40 mg total) by mouth daily. Patient not taking: Reported on 09/21/2022 06/11/21   Tommie Sams, DO  Respiratory Therapy Supplies (ADULT MASK LARGE) MISC See admin instructions. use with inhaler 10/17/21   [provider]  Spacer/Aero-Holding Chambers (AEROCHAMBER PLUS) inhaler Use with inhaler 10/15/21   Domenick Gong, MD  tiotropium (SPIRIVA HANDIHALER) 18 MCG inhalation capsule Place 1 capsule (18 mcg total) into inhaler and inhale daily. Patient not taking: Reported on 06/05/2022 09/05/21   Kathlen Mody, MD  TRULICITY 0.75 MG/0.5ML SOPN Inject 0.75 mg into the skin once a week. Patient not taking: Reported on 09/21/2022 06/28/22   [provider]    Social History   Socioeconomic History   Marital status: Married    Spouse name: Not on file   Number of children: Not on file   Years of education: Not on file   Highest education level: Not on file  Occupational History   Not on file  Tobacco Use   Smoking status: Former    Packs/day: 0.50     Years: 25.00    Total pack years: 12.50    Types: Cigarettes    Quit date: 07/06/2014    Years since quitting: 8.2   Smokeless tobacco: Never  Vaping Use   Vaping Use: Never used  Substance and Sexual Activity   Alcohol use: No    Alcohol/week: 0.0 standard drinks of alcohol   Drug use: No   Sexual activity: Never  Other Topics Concern   Not on file  Social History Narrative   Not on file   Social Determinants of Health   Financial Resource Strain: Not on file  Food Insecurity: No Food Insecurity (09/23/2022)   Hunger Vital Sign    Worried About Running Out of Food in the Last Year: Never true    Ran Out of Food in the Last Year: Never true  Transportation Needs: No Transportation Needs (09/23/2022)   PRAPARE - Administrator, Civil Service (Medical): No    Lack of Transportation (Non-Medical): No  Physical Activity: Not on file  Stress: Not on file  Social Connections: Not on file  Intimate Partner Violence: Not At Risk (09/23/2022)   Humiliation, Afraid, Rape, and Kick questionnaire    Fear of Current or Ex-Partner: No    Emotionally Abused: No    Physically Abused: No    Sexually Abused: No     Family History  Problem Relation Age of Onset   CAD Mother    CAD Father    Breast cancer Neg Hx     ROS: Otherwise negative unless mentioned in HPI  Physical Examination  Vitals:   09/25/22 1300 09/25/22 1349  BP: (!) 161/98   Pulse: (!) 116   Resp: 20   Temp:    SpO2: 92% 94%   Body mass index is 27.1 kg/m.  General:  WDWN in NAD Gait: Not observed HENT: WNL, normocephalic Pulmonary: normal non-labored breathing, without Rales, rhonchi,  wheezing Cardiac: regular, Tachycardic at 120, without  Murmurs, rubs or gallops; without carotid bruits Abdomen: Positive Bowel Sounds, soft, NT/ND, no masses Skin: without rashes Vascular Exam/Pulses: Patient has bilateral doppler pulses +1 on the right and +4 on the left. Right lower extremity is cool to  touch while left lower extremity is warm to the touch. No pain on palpation.  Extremities: without ischemic changes, without Gangrene , without cellulitis; without open wounds;  Musculoskeletal: no muscle wasting or atrophy  Neurologic: A&O X 3;  No focal weakness or paresthesias are detected; speech is fluent/normal Psychiatric:  The pt has Normal affect. Lymph:  Unremarkable  CBC    Component Value Date/Time   WBC 15.9 (H) 09/25/2022 0445   RBC 3.83 (L) 09/25/2022 0445   HGB 10.9 (L) 09/25/2022 0445   HGB 15.4 05/31/2014 0626   HCT 34.5 (L) 09/25/2022 0445   HCT 48.5 (H) 05/31/2014 0626   PLT 301 09/25/2022 0445   PLT 215 05/31/2014 0626   MCV 90.1 09/25/2022 0445   MCV 93 05/31/2014 0626   MCH 28.5 09/25/2022 0445   MCHC 31.6 09/25/2022 0445   RDW 15.5 09/25/2022 0445   RDW 14.8 (H) 05/31/2014 0626   LYMPHSABS 0.9 09/21/2022 1341   LYMPHSABS 1.0 05/31/2014 0626   MONOABS 0.5 09/21/2022 1341   MONOABS 0.3 05/31/2014 0626   EOSABS 0.0 09/21/2022 1341   EOSABS 0.0 05/31/2014 0626   BASOSABS 0.1 09/21/2022 1341   BASOSABS 0.0 05/31/2014 0626    BMET    Component Value Date/Time   NA 144 09/25/2022 0445   NA 142 05/31/2014 0626   K 3.8 09/25/2022 0445   K 4.5 05/31/2014 0626   CL 111 09/25/2022 0445   CL 110 (H) 05/31/2014 0626   CO2 26 09/25/2022 0445   CO2 26 05/31/2014 0626   GLUCOSE 153 (H) 09/25/2022 0445   GLUCOSE 121 (H) 05/31/2014 0626   BUN 39 (H) 09/25/2022 0445   BUN 18 05/31/2014 0626   CREATININE 0.85 09/25/2022 0445   CREATININE 0.70 05/31/2014 0626   CALCIUM 10.2 09/25/2022 0445   CALCIUM 9.5 05/31/2014 0626   GFRNONAA >60 09/25/2022 0445   GFRNONAA >60 05/31/2014 0626   GFRAA >60 07/11/2018 0333   GFRAA >60 05/31/2014 0626    COAGS: Lab Results  Component Value Date   INR 1.3 (H) 09/21/2022     Non-Invasive Vascular Imaging:   EXAM: CT ABDOMEN AND PELVIS WITH CONTRAST  IMPRESSION: 1. Examination is positive for small bowel  obstruction with transition to diminished caliber distal small bowel within the right lower quadrant of the  abdomen at the level of distal ileum. 2. Subpleural consolidation and ground-glass attenuation in the posterior right lower lobe. Findings are concerning for pneumonia. 3. 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 This appears nearly completely thrombosed by noncalcified plaque with reconstitution distal to the aneurysm. 4. Small cystic lesion within tail of pancreas measures 0.7 cm. Recommend follow-up imaging in 24 months with pancreas protocol MRI. This recommendation follows ACR consensus guidelines: Managing Incidental Findings on Abdominal CT: White Paper of the ACR Incidental Findings Committee. J Am Coll Radiol 2010;7:754-773. 5. Fat containing umbilical hernia. 6.  Aortic Atherosclerosis (ICD10-I70.0).  Statin:  No. Beta Blocker:  No. Aspirin:  Yes.   ACEI:  No. ARB:  Yes.   CCB use:  Yes Other antiplatelets/anticoagulants:  Yes.   Lovenox 40 mg Inj   ASSESSMENT/PLAN: This is a 72 y.o. 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   On exam she has weaker doppler pulses on the right and her lower extremity is cool to touch. There are no open sores and she denies having any pain while ambulating, at rest or on palpation.   PLAN: Considering her current status, pneumonia, SBO it would not benefit the patient to have a procedure urgently. Currently she is not having limb threatening ischemia. The patient would benefit from recovering from her current status then seeing [place] back in the office after discharge with ABI's and arterial duplex ultrasound of the right lower extremity to develop a plan. At that time she  may benefit from a right lower extremity angiogram with possible stent placement and angioplasty.   I discussed all this with both the patient and her husband who was at the bedside today. I answered all their questions. They both verbalized their understanding and agree with the plan.       Marcie [facility] 09/25/2022 3:26 PM

## 2022-09-25 NOTE — Progress Notes (Signed)
Pt's husband and pt's sister updated at bedside.  All questions answered.    Darel Hong, AGACNP-BC [facility] Prefer epic messenger for cross cover needs If after hours, please call E-link

## 2022-09-25 NOTE — Progress Notes (Signed)
  Spoke to spouse regarding his wife the PT Mrs. Katherine Carter advise him we are around 24/7. They are both active member in their local church with a strong faith believe in [place]. Was unable to speak to Patient but she has strong family and community support. Told Spouse I will be revisiting a later day but will be around until 1700 hours today.  09/25/22 1600  Spiritual Encounters  Type of Visit Initial  Care provided to: [facility] partners present during encounter  (Spouse)  Referral source IDT Rounds  Reason for visit Routine spiritual support  OnCall Visit No  Spiritual Framework  Presenting Themes Values and beliefs;Meaning/purpose/sources of inspiration  Community/Connection Family;Spiritual leader;Faith community;Significant other  Strengths Strong Faith in Jesus Christ  Needs/Challenges/Barriers Prayer  Patient Stress Factors None identified  Family Stress Factors None identified  Interventions  Spiritual Care Interventions Made Established relationship of care and support;Compassionate presence;Reflective listening;Normalization of emotions  Intervention Outcomes  Outcomes Awareness of support  Spiritual Care Plan  Spiritual Care Issues Still Outstanding Chaplain will continue to follow

## 2022-09-25 NOTE — Final Progress Note (Signed)
Patient accepted from PCCM.  TRH will assume attending role on 09/26/2022 at 7 AM

## 2022-09-25 NOTE — Progress Notes (Signed)
Occupational Therapy Treatment Patient Details Name: Katherine Carter MRN: [id number] DOB: [birth year] Today's Date: 09/25/2022   History of present illness Pt admitted for Acute on chronic respiratory failure with hypoxia along with sepsis. Pt with complaints of weakness, falls, and cough. HIstory includes COPD on 4L of O2 at baseline, DM, HTN, and GERD.   OT comments  Ms Mccaughey was seen for OT treatment on this date. Upon arrival to room pt reclined in bed, noted to have urine incontinence, agreeable to OOB for bed change. Pt requires MIN A + HHA sit<>stand x2, poor tolerance. MAX A don/doff underwear standing and socks sitting. Standing max HR seen 142 bpm, SpO2 89% on 6L [place], RN increased to 8L [place] SpO2 mid 90s. Pt making progress toward goals, will increased frequency as pt is at risk for deconditioning. Discharge recommendation remains appropriate however if pt unable to have assistance for mobility at home then STR is appropriate.      Recommendations for follow up therapy are one component of a multi-disciplinary discharge planning process, led by the attending physician.  Recommendations may be updated based on patient status, additional functional criteria and insurance authorization.    Follow Up Recommendations  Home health OT (if unable to have assistance for all moblity then rec STR)     Assistance Recommended at Discharge Frequent or constant Supervision/Assistance  Patient can return home with the following  Assistance with cooking/housework;Assist for transportation;Help with stairs or ramp for entrance;A lot of help with walking and/or transfers;A lot of help with bathing/dressing/bathroom   Equipment Recommendations  BSC/3in1    Recommendations for Other Services      Precautions / Restrictions Precautions Precautions: Fall Restrictions Weight Bearing Restrictions: No       Mobility Bed Mobility Overal bed mobility: Needs Assistance Bed Mobility: Supine to  Sit, Sit to Supine     Supine to sit: Min guard Sit to supine: Min guard        Transfers Overall transfer level: Needs assistance Equipment used: 1 person hand held assist Transfers: Sit to/from Stand Sit to Stand: Min assist           General transfer comment: x2 stands at EOB. vitals elevated with exertion, poor tolerance.     Balance Overall balance assessment: Needs assistance, History of Falls Sitting-balance support: Feet supported, Bilateral upper extremity supported Sitting balance-Leahy Scale: Good     Standing balance support: Bilateral upper extremity supported Standing balance-Leahy Scale: Fair                             ADL either performed or assessed with clinical judgement   ADL Overall ADL's : Needs assistance/impaired                                       General ADL Comments: MIN A + HHA simulated BSC t/f. MAX A don/doff underwear standing and socks sitting. SETUP seated grooming tasks.       Cognition Arousal/Alertness: Awake/alert Behavior During Therapy: WFL for tasks assessed/performed Overall Cognitive Status: Within Functional Limits for tasks assessed                                 General Comments: stating "can you help me breathe" when short of breath  Exercises Other Exercises Other Exercises: educated on PLB and positioning    Shoulder Instructions       General Comments standing max HR seen 142 bpm, SpO2 89% on 6L [place], RN increased to 8L  SpO2 mid 90s    Pertinent Vitals/ Pain       Pain Assessment Pain Assessment: No/denies pain   Frequency  Min 3X/week        Progress Toward Goals  OT Goals(current goals can now be found in the care plan section)  Progress towards OT goals: Progressing toward goals  Acute Rehab OT Goals Patient Stated Goal: to breathe better OT Goal Formulation: With patient/family Time For Goal Achievement: 10/07/22 Potential to  Achieve Goals: Good ADL Goals Pt Will Perform Grooming: with modified independence;standing Pt Will Perform Lower Body Dressing: with modified independence;sit to/from stand Pt Will Transfer to Toilet: with modified independence;ambulating Pt Will Perform Toileting - Clothing Manipulation and hygiene: with modified independence;sit to/from stand Additional ADL Goal #1: Pt will provide teach back of 2-3 Energy conservation techniques for improved activity tolerance in ADL/IADL tasks  Plan Discharge plan remains appropriate;Frequency needs to be updated    Co-evaluation                 AM-PAC OT "6 Clicks" Daily Activity     Outcome Measure   Help from another person eating meals?: None Help from another person taking care of personal grooming?: A Little Help from another person toileting, which includes using toliet, bedpan, or urinal?: A Lot Help from another person bathing (including washing, rinsing, drying)?: A Lot Help from another person to put on and taking off regular upper body clothing?: A Little Help from another person to put on and taking off regular lower body clothing?: A Lot 6 Click Score: 16    End of Session    OT Visit Diagnosis: Unsteadiness on feet (R26.81);Muscle weakness (generalized) (M62.81);History of falling (Z91.81)   Activity Tolerance Patient limited by fatigue   Patient Left in bed;with call bell/phone within reach;with nursing/sitter in room   Nurse Communication Mobility status        Time: 5400-8676 OT Time Calculation (min): 25 min  Charges: OT General Charges $OT Visit: 1 Visit OT Treatments $Self Care/Home Management : 23-37 mins  Dessie Coma, M.S. OTR/L  09/25/22, 9:22 AM  ascom [phone]

## 2022-09-25 NOTE — Progress Notes (Signed)
Initial Nutrition Assessment  DOCUMENTATION CODES:   Severe malnutrition in context of chronic illness  INTERVENTION:   TPN per pharmacy  Recommend thiamine 100mg  daily added to TPN x 3 days.   Pt at high refeed risk; recommend monitor potassium, magnesium and phosphorus labs daily until stable  Daily weights   NUTRITION DIAGNOSIS:   Severe Malnutrition related to chronic illness (COPD) as evidenced by severe fat depletion, severe muscle depletion, 10 percent weight loss in 4 months.  GOAL:   Patient will meet greater than or equal to 90% of their needs -not met   MONITOR:   Diet advancement, Labs, Weight trends, Skin, I & O's, Other (Comment) (TPN)  REASON FOR ASSESSMENT:   Consult New TPN/TNA  ASSESSMENT:   72 y/o female with h/o HTN, COPD, DM and GERD who is admitted with DKA, sepsis, CAP, AKI and SBO.  Met with pt and pt's daughter in room today. Pt reports that she is feeling much better today. Pt is asking to eat and drink. Daughter reports pt with poor oral intake for several months pta. Both pt and daughter report a 20lb recent weight loss. Per chart, pt appears to be down ~17lbs(10%) since September; this is significant. Pt remains NPO d/t concerns for SBO. KUB and CT scan both reporting SBO. NGT unable to be placed after multiple attempts. Pt passed flatus and BM after suppository yesterday. Pt denies any flatus or BM today. Pt with abdominal distension but abdomen appears soft. Pt denies any nausea or abdominal pain. Pt with PICC line in place. Will plan to initiate TPN today. Pt is at high refeed risk. Will trial clear liquid diet once ok per surgery.     Medications reviewed and include: dulcolax, lovenox, insulin, solu-medrol, cefepime, TPN   Labs reviewed: K 3.8 wnl, BUN 39(H), P 3.4 wnl, Mg 2.1 wnl Wbc- 15.9(H), Hgb 10.9(L), Hct 34.5(L) Cbgs- 95, 151, 145 x 24 hrs  AIC 9.0(H)- 1/19  NUTRITION - FOCUSED PHYSICAL EXAM:  Flowsheet Row Most Recent Value   Orbital Region Mild depletion  Upper Arm Region Severe depletion  Thoracic and Lumbar Region Moderate depletion  Buccal Region Mild depletion  Temple Region Mild depletion  Clavicle Bone Region Moderate depletion  Clavicle and Acromion Bone Region Moderate depletion  Scapular Bone Region Moderate depletion  Dorsal Hand Severe depletion  Patellar Region Severe depletion  Anterior Thigh Region Severe depletion  Posterior Calf Region Severe depletion  Edema (RD Assessment) None  Hair Reviewed  Eyes Reviewed  Mouth Reviewed  Skin Reviewed  Nails Reviewed   Diet Order:   Diet Order             Diet NPO time specified  Diet effective now                  EDUCATION NEEDS:   Education needs have been addressed  Skin:  Skin Assessment: Reviewed RN Assessment  Last BM:  1/21- TYPE 3  Height:   Ht Readings from Last 1 Encounters:  09/21/22 5\' 3"  (1.6 m)    Weight:   Wt Readings from Last 1 Encounters:  09/25/22 69.4 kg    Ideal Body Weight:  52 kg  BMI:  Body mass index is 27.1 kg/m.  Estimated Nutritional Needs:   Kcal:  1600-1800kcal/day  Protein:  80-90g/day  Fluid:  1.4-1.6L/day  Koleen Distance MS, RD, LDN Please refer to [facility] for RD and/or RD on-call/weekend/after hours pager

## 2022-09-25 NOTE — Consult Note (Signed)
PHARMACY - TOTAL PARENTERAL NUTRITION CONSULT NOTE   Indication: Small bowel obstruction  Patient Measurements: Height: 5\' 3"  (160 cm) Weight: 69.4 kg (153 lb) IBW/kg (Calculated) : 52.4 TPN AdjBW (KG): 56.7 Body mass index is 27.1 kg/m. Usual Weight: 70-72 kg  Assessment:  72 y.o. female history of COPD admitted for acute on chronic respiratory failure and sepsis likely from multifocal pneumonia.  Was found to have acute kidney injury.  She was transferred to the ICU last couple of days and yesterday started having some abdominal discomfort and this morning having more abdominal pain.  The pain was diffuse intermittent moderate intensity.  There was a CT scan that was obtained that have personally reviewed showing evidence of dilated loops of bowel but no evidence of pneumatosis or closed-loop obstruction. NG tube could not be placed.  She now feels better.  She continues to have respiratory issues.  She denies any abdominal pain.  She did have a bowel movement and is passing flatus. Pharmacy has been consulted to initiate TPN as patient has not eaten in~5 days  Glucose / Insulin: BG 95-253, insulin last 24h: 10 units Electrolytes: currently WNL Renal: SCR<1, stable Hepatic: AST/ALT WNL Intake / Output; MIVF: net (-1.7L), no IVF currently ordered GI Imaging: 1/21 CT of Abdomen: Examination is positive for small bowel obstruction with transition to diminished caliber distal small bowel within the right lower quadrant of the abdomen at the level of distal ileum. GI Surgeries / Procedures:  No current surgical intervention planned  Central access: Patient with PICC already placed prior to admission TPN start date: 09/25/22  Nutritional Goals: Goal TPN rate is 60 mL/hr (provides 86.4 g of protein and 1685 kcals per day)  RD Assessment: Kcal goal: 1600-1800 kcals/day Protein: 80-90 g/day Fluids: 1.4-1.6 L/day    Current Nutrition:  NPO  Plan:  Start TPN at 50mL/hr(1/3 of goal  rate) at 1800 Electrolytes in TPN: Na 65mEq/L, K 31mEq/L, Ca 39mEq/L, Mg 35mEq/L, and Phos 79mmol/L. Cl:Ac 1:1 Add standard MVI and trace elements to TPN Will also add trace elements x 3 days(1 of 3) Initiate Resistant q6h SSI and adjust as needed  Monitor TPN labs on Mon/Thurs, daily until stable  Sanja Elizardo A Mathhew Buysse 09/25/2022,2:01 PM

## 2022-09-26 ENCOUNTER — Inpatient Hospital Stay: Payer: Medicare Other

## 2022-09-26 DIAGNOSIS — A419 Sepsis, unspecified organism: Secondary | ICD-10-CM | POA: Diagnosis not present

## 2022-09-26 DIAGNOSIS — K567 Ileus, unspecified: Secondary | ICD-10-CM | POA: Diagnosis not present

## 2022-09-26 DIAGNOSIS — Z515 Encounter for palliative care: Secondary | ICD-10-CM

## 2022-09-26 DIAGNOSIS — I10 Essential (primary) hypertension: Secondary | ICD-10-CM

## 2022-09-26 DIAGNOSIS — I723 Aneurysm of iliac artery: Secondary | ICD-10-CM

## 2022-09-26 DIAGNOSIS — E43 Unspecified severe protein-calorie malnutrition: Secondary | ICD-10-CM | POA: Diagnosis present

## 2022-09-26 DIAGNOSIS — J189 Pneumonia, unspecified organism: Secondary | ICD-10-CM | POA: Diagnosis not present

## 2022-09-26 DIAGNOSIS — E1165 Type 2 diabetes mellitus with hyperglycemia: Secondary | ICD-10-CM

## 2022-09-26 DIAGNOSIS — J9621 Acute and chronic respiratory failure with hypoxia: Secondary | ICD-10-CM | POA: Diagnosis not present

## 2022-09-26 LAB — PHOSPHORUS: Phosphorus: 4.3 mg/dL (ref 2.5–4.6)

## 2022-09-26 LAB — COMPREHENSIVE METABOLIC PANEL
ALT: 50 U/L — ABNORMAL HIGH (ref 0–44)
AST: 36 U/L (ref 15–41)
Albumin: 2 g/dL — ABNORMAL LOW (ref 3.5–5.0)
Alkaline Phosphatase: 92 U/L (ref 38–126)
Anion gap: 7 (ref 5–15)
BUN: 42 mg/dL — ABNORMAL HIGH (ref 8–23)
CO2: 26 mmol/L (ref 22–32)
Calcium: 10.1 mg/dL (ref 8.9–10.3)
Chloride: 108 mmol/L (ref 98–111)
Creatinine, Ser: 0.9 mg/dL (ref 0.44–1.00)
GFR, Estimated: >60 mL/min (ref >60)
Glucose, Bld: 570 mg/dL (ref 70–99)
Potassium: 5 mmol/L (ref 3.5–5.1)
Sodium: 141 mmol/L (ref 135–145)
Total Bilirubin: 0.5 mg/dL (ref 0.3–1.2)
Total Protein: 7.8 g/dL (ref 6.5–8.1)

## 2022-09-26 LAB — BLOOD GAS, VENOUS
Acid-base deficit: 8 mmol/L — ABNORMAL HIGH (ref 0.0–2.0)
Bicarbonate: 21.2 mmol/L (ref 20.0–28.0)
Patient temperature: 37
pCO2, Ven: 58 mmHg (ref 44–60)
pH, Ven: 7.17 — CL (ref 7.25–7.43)

## 2022-09-26 LAB — CBC
HCT: 36.2 % (ref 36.0–46.0)
Hemoglobin: 11.1 g/dL — ABNORMAL LOW (ref 12.0–15.0)
MCH: 28 pg (ref 26.0–34.0)
MCHC: 30.7 g/dL (ref 30.0–36.0)
MCV: 91.2 fL (ref 80.0–100.0)
Platelets: 317 10*3/uL (ref 150–400)
RBC: 3.97 MIL/uL (ref 3.87–5.11)
RDW: 15.6 % — ABNORMAL HIGH (ref 11.5–15.5)
WBC: 11.8 10*3/uL — ABNORMAL HIGH (ref 4.0–10.5)
nRBC: 0.2 % (ref 0.0–0.2)

## 2022-09-26 LAB — CULTURE, BLOOD (ROUTINE X 2)
Culture: NO GROWTH
Culture: NO GROWTH
Special Requests: ADEQUATE

## 2022-09-26 LAB — GLUCOSE, CAPILLARY
Glucose-Capillary: 235 mg/dL — ABNORMAL HIGH (ref 70–99)
Glucose-Capillary: 240 mg/dL — ABNORMAL HIGH (ref 70–99)
Glucose-Capillary: 205 mg/dL — ABNORMAL HIGH (ref 70–99)
Glucose-Capillary: 161 mg/dL — ABNORMAL HIGH (ref 70–99)
Glucose-Capillary: 238 mg/dL — ABNORMAL HIGH (ref 70–99)
Glucose-Capillary: 321 mg/dL — ABNORMAL HIGH (ref 70–99)
Glucose-Capillary: 374 mg/dL — ABNORMAL HIGH (ref 70–99)

## 2022-09-26 LAB — GLUCOSE, RANDOM: Glucose, Bld: 248 mg/dL — ABNORMAL HIGH (ref 70–99)

## 2022-09-26 LAB — TRIGLYCERIDES
Triglycerides: 181 mg/dL — ABNORMAL HIGH (ref <150)
Triglycerides: 95 mg/dL (ref <150)

## 2022-09-26 LAB — MAGNESIUM: Magnesium: 2.4 mg/dL (ref 1.7–2.4)

## 2022-09-26 MED ORDER — TRACE MINERALS CU-MN-SE-ZN 300-55-60-3000 MCG/ML IV SOLN
INTRAVENOUS | Status: DC
  Filled 2022-09-26: qty 384

## 2022-09-26 MED ORDER — BOOST / RESOURCE BREEZE PO LIQD CUSTOM
1.0000 | Freq: Three times a day (TID) | ORAL | Status: DC
  Administered 2022-09-26 – 2022-09-30 (×5): 1 via ORAL

## 2022-09-26 MED ORDER — LACTATED RINGERS IV SOLN
INTRAVENOUS | Status: DC
  Administered 2022-09-26: 75 mL/h via INTRAVENOUS

## 2022-09-26 MED ORDER — METOCLOPRAMIDE HCL 5 MG/ML IJ SOLN
5.0000 mg | Freq: Three times a day (TID) | INTRAMUSCULAR | Status: DC
  Administered 2022-09-26 – 2022-10-05 (×28): 5 mg via INTRAVENOUS
  Filled 2022-09-26 (×28): qty 2

## 2022-09-26 MED ORDER — ASPIRIN 81 MG PO TBEC
81.0000 mg | DELAYED_RELEASE_TABLET | Freq: Every day | ORAL | Status: DC
  Administered 2022-09-26 – 2022-10-05 (×10): 81 mg via ORAL
  Filled 2022-09-26 (×10): qty 1

## 2022-09-26 MED ORDER — METHYLPREDNISOLONE SODIUM SUCC 40 MG IJ SOLR
20.0000 mg | Freq: Every day | INTRAMUSCULAR | Status: DC
  Administered 2022-09-27 – 2022-10-03 (×7): 20 mg via INTRAVENOUS
  Filled 2022-09-26 (×7): qty 1

## 2022-09-26 MED ORDER — INSULIN ASPART 100 UNIT/ML IJ SOLN
0.0000 [IU] | INTRAMUSCULAR | Status: DC
  Administered 2022-09-26: 7 [IU] via SUBCUTANEOUS
  Administered 2022-09-26: 4 [IU] via SUBCUTANEOUS
  Administered 2022-09-26: 20 [IU] via SUBCUTANEOUS
  Administered 2022-09-27 (×2): 4 [IU] via SUBCUTANEOUS
  Administered 2022-09-27: 3 [IU] via SUBCUTANEOUS
  Administered 2022-09-27: 4 [IU] via SUBCUTANEOUS
  Administered 2022-09-27 – 2022-09-28 (×3): 7 [IU] via SUBCUTANEOUS
  Administered 2022-09-28 (×2): 4 [IU] via SUBCUTANEOUS
  Administered 2022-09-28: 11 [IU] via SUBCUTANEOUS
  Administered 2022-09-28 (×2): 7 [IU] via SUBCUTANEOUS
  Administered 2022-09-29: 11 [IU] via SUBCUTANEOUS
  Administered 2022-09-29: 4 [IU] via SUBCUTANEOUS
  Administered 2022-09-29: 7 [IU] via SUBCUTANEOUS
  Administered 2022-09-29 (×2): 4 [IU] via SUBCUTANEOUS
  Administered 2022-09-30: 11 [IU] via SUBCUTANEOUS
  Administered 2022-09-30: 4 [IU] via SUBCUTANEOUS
  Administered 2022-09-30: 11 [IU] via SUBCUTANEOUS
  Administered 2022-09-30: 4 [IU] via SUBCUTANEOUS
  Administered 2022-09-30: 7 [IU] via SUBCUTANEOUS
  Administered 2022-09-30: 11 [IU] via SUBCUTANEOUS
  Administered 2022-10-01 (×2): 4 [IU] via SUBCUTANEOUS
  Administered 2022-10-01 – 2022-10-02 (×2): 11 [IU] via SUBCUTANEOUS
  Administered 2022-10-02: 7 [IU] via SUBCUTANEOUS
  Administered 2022-10-02: 4 [IU] via SUBCUTANEOUS
  Administered 2022-10-02: 7 [IU] via SUBCUTANEOUS
  Administered 2022-10-02: 11 [IU] via SUBCUTANEOUS
  Administered 2022-10-02 – 2022-10-03 (×4): 4 [IU] via SUBCUTANEOUS
  Administered 2022-10-03: 7 [IU] via SUBCUTANEOUS
  Administered 2022-10-03: 11 [IU] via SUBCUTANEOUS
  Administered 2022-10-03 (×2): 4 [IU] via SUBCUTANEOUS
  Administered 2022-10-04: 3 [IU] via SUBCUTANEOUS
  Administered 2022-10-04: 4 [IU] via SUBCUTANEOUS
  Administered 2022-10-04 (×2): 3 [IU] via SUBCUTANEOUS
  Administered 2022-10-04: 4 [IU] via SUBCUTANEOUS
  Administered 2022-10-05 (×2): 3 [IU] via SUBCUTANEOUS
  Filled 2022-09-26 (×50): qty 1

## 2022-09-26 NOTE — Assessment & Plan Note (Signed)
Blood pressure elevated.  Home medications are currently on hold due to n.p.o. status. -Adding metoprolol 5 mg every 8 hourly. -As needed labetalol and hydralazine -Continue amlodipine -We will add back losartan if needed

## 2022-09-26 NOTE — Progress Notes (Signed)
More alert today. Oriented except for date of day. Moved self around in bed. Lots of company. Up to chair for 1.5 hours with moderate help. Incontinent x 2. 1722 Pericare and purewic changed. Refused bath this afternoon. Changed to 4L [place] with moisture-no high flow. Remained in ST most of the day. No nausea or vomiting today. Ate ice and had sips of Boost breeze. No BM. Abdomen more distended. Bowel sounds hypoactive.

## 2022-09-26 NOTE — Progress Notes (Signed)
[facility] SURGICAL PROGRESS NOTE (cpt [phone])  Hospital Day(s): 5.   Interval History: Patient seen and examined, no acute events or new complaints overnight. Patient reports she is feeling better. No abdominal pain, nausea, emesis She remains distended. Previously noted leukocytosis is improving; WBC down to 11.8K (from 15.9K yesterday). Renal function stable; BUN 42, sCr - 0.90; UO - 1175 ccs. No significant electrolyte derangements. Hyperglycemic to 570 this morning (repeat 161). Repeat KUB this morning with continued dilated loops of small bowel consistent with ileus. Discussed with RN, no definitve bowel function.   Review of Systems:  Constitutional: denies fever, chills  HEENT: denies cough or congestion  Respiratory: denies any shortness of breath  Cardiovascular: denies chest pain or palpitations  Gastrointestinal: + distension, denied abdominal pain, nausea, emesis Genitourinary: denies burning with urination or urinary frequency Musculoskeletal: denies pain, decreased motor or sensation  Vital signs in last 24 hours: [min-max] current  Temp:  [97.5 F (36.4 C)-98.2 F (36.8 C)] 97.7 F (36.5 C) (01/23 0109) Pulse Rate:  [81-135] 125 (01/23 1000) Resp:  [14-32] 24 (01/23 1000) BP: (131-201)/(80-118) 150/80 (01/23 1000) SpO2:  [91 %-100 %] 96 % (01/23 1000) Weight:  [66.4 kg] 66.4 kg (01/23 0500)     Height: 5\' 3"  (160 cm) Weight: 66.4 kg BMI (Calculated): 25.94   Intake/Output last 2 shifts:  01/22 0701 - 01/23 0700 In: 746.2 [I.V.:246.7; IV Piggyback:499.6] Out: 1175 [Urine:1175]   Physical Exam:  Constitutional: alert, cooperative and no distress  HENT: normocephalic without obvious abnormality  Eyes: PERRL, EOM's grossly intact and symmetric  Respiratory: breathing non-labored at rest; undergoing breathing treatment  Cardiovascular: tachycardic and sinus rhythm  Gastrointestinal: soft, non-tender, she remains distended, no rebound/guarding. Known  umbilical hernia, soft  Musculoskeletal: no edema or wounds, motor and sensation grossly intact, NT    Labs:     Latest Ref Rng & Units 09/26/2022    3:48 AM 09/25/2022    4:45 AM 09/24/2022    3:49 AM  CBC  WBC 4.0 - 10.5 K/uL 11.8  15.9  15.6   Hemoglobin 12.0 - 15.0 g/dL 11.1  10.9  11.4   Hematocrit 36.0 - 46.0 % 36.2  34.5  36.1   Platelets 150 - 400 K/uL 317  301  299       Latest Ref Rng & Units 09/26/2022    4:52 AM 09/26/2022    3:48 AM 09/25/2022    3:45 PM  CMP  Glucose 70 - 99 mg/dL 248  570  141   BUN 8 - 23 mg/dL  42  37   Creatinine 0.44 - 1.00 mg/dL  0.90  0.89   Sodium 135 - 145 mmol/L  141  143   Potassium 3.5 - 5.1 mmol/L  5.0  4.0   Chloride 98 - 111 mmol/L  108  111   CO2 22 - 32 mmol/L  26  23   Calcium 8.9 - 10.3 mg/dL  10.1  10.2   Total Protein 6.5 - 8.1 g/dL  7.8  8.0   Total Bilirubin 0.3 - 1.2 mg/dL  0.5  0.8   Alkaline Phos 38 - 126 U/L  92  92   AST 15 - 41 U/L  36  34   ALT 0 - 44 U/L  50  52      Imaging studies:   KUB (09/26/2022) personally reviewed showing stable appearance of dilated small bowel, no free air, no pneumatosis, and radiologist report reviewed below:  IMPRESSION: Stable small bowel dilatation as described above.   Assessment/Plan: (ICD-10's: K31.7) 72 y.o. female with likely ileus secondary to acute medical issues, complicated by exacerbated COPD and PNA   - Reasonable to do sips of water, Boost breeze, ice chips. Would hold on formal diet order right now.   - No need for NGT at this moment   - No role for surgical intervention at this time; She is a very sub-optimal surgical candidate - Continue TPN - Will get serial KUB in AM   - Monitor abdominal examination; on-going bowel function   - Pain control prn; limit narcotics - Antiemetics prn - Mobilize; engage therapies if needed   - Further management per primary service; I will follow for now    All of the above findings and recommendations were discussed with the  patient, and the medical team, and all of patient's questions were answered to her expressed satisfaction.   -- Edison Simon, PA-C [facility] 09/26/2022, 1:45 PM M-F: 7am - 4pm

## 2022-09-26 NOTE — Assessment & Plan Note (Addendum)
Patient developed ileus most likely secondary to acute illness.  No surgery done. General surgery was consulted, serial KUB with stable bowel dilatation. Bowel sounds hypoactive and had a bowel movement yesterday with suppository. General surgery was consulted and they were advising gut rest. Patient was started on TPN. -Follow general surgery recommendations

## 2022-09-26 NOTE — Consult Note (Signed)
PHARMACY - TOTAL PARENTERAL NUTRITION CONSULT NOTE   Indication: Small bowel obstruction  Patient Measurements: Height: 5\' 3"  (160 cm) Weight: 66.4 kg (146 lb 6.2 oz) IBW/kg (Calculated) : 52.4 TPN AdjBW (KG): 56.7 Body mass index is 25.93 kg/m. Usual Weight: 70-72 kg  Assessment:  72 y.o. female history of COPD admitted for acute on chronic respiratory failure and sepsis likely from multifocal pneumonia.  Was found to have acute kidney injury.  She was transferred to the ICU last couple of days and yesterday started having some abdominal discomfort and this morning having more abdominal pain.  The pain was diffuse intermittent moderate intensity.  There was a CT scan that was obtained that have personally reviewed showing evidence of dilated loops of bowel but no evidence of pneumatosis or closed-loop obstruction. NG tube could not be placed.  She now feels better.  She continues to have respiratory issues.  She denies any abdominal pain.  She did have a bowel movement and is passing flatus. Pharmacy has been consulted to initiate TPN as patient has not eaten in~5 days  Glucose / Insulin: BG 141-248, insulin last 24h: 21 units Electrolytes: currently WNL Renal: SCR<1, stable Hepatic: AST/ALT WNL Intake / Output; MIVF: net (-2.6L), no IVF currently ordered GI Imaging: 1/21 CT of Abdomen: Examination is positive for small bowel obstruction with transition to diminished caliber distal small bowel within the right lower quadrant of the abdomen at the level of distal ileum. GI Surgeries / Procedures:  No current surgical intervention planned  Central access: Patient with PICC already placed prior to admission TPN start date: 09/25/22  Nutritional Goals: Goal TPN rate is 60 mL/hr (provides 86.4 g of protein and 1685 kcals per day)  RD Assessment: Kcal goal: 1600-1800 kcals/day Protein: 80-90 g/day Fluids: 1.4-1.6 L/day Estimated Needs Total Energy Estimated Needs:  1600-1800kcal/day Total Protein Estimated Needs: 80-90g/day Total Fluid Estimated Needs: 1.4-1.6L/day  Current Nutrition:  NPO  Plan:  Increase TPN to 1mL/hr(2/3 of goal rate) at 1800 Electrolytes in TPN: Na 45mEq/L, K 65mEq/L, Ca 78mEq/L, Mg 61mEq/L, and Phos 11mmol/L. Cl:Ac 1:1 Add standard MVI and trace elements to TPN Will also add trace elements x 3 days(2 of 3) Increase to Resistant q4h SSI and added 14 units of insulin to TPN Monitor TPN labs on Mon/Thurs, daily until stable  Katherine Carter 09/26/2022,2:21 PM

## 2022-09-26 NOTE — Assessment & Plan Note (Signed)
-  Continue Levemir at 21 units daily -Continue with SSI

## 2022-09-26 NOTE — Progress Notes (Signed)
     Referral received for Katherine Carter re: goals of care discussion. Chart reviewed and updates received from RN. Patient assessed and is unable to engage in discussions independently.   I attempted to contact patient's husband Katherine Carter over the phone. There was no answer (neither on home nor on mobile number). I left a HIPPA compliant voicemail with PMT contact information.   I requested that RN notify me if/when family comes to bedside today.   PMT will re-attempt to contact family at a later time/date. Detailed note and recommendations to follow once GOC has been completed.   Thank you for your referral and allowing PMT to assist in Katherine Carter's care.   Jordan Hawks, FNP-BC Palliative Medicine Team  Phone: [phone]  NO CHARGE

## 2022-09-26 NOTE — Progress Notes (Signed)
Progress Note   Patient: Katherine Carter WGN:[id number] DOB: [birth year] DOA: 09/21/2022     5 DOS: the patient was seen and examined on 09/26/2022   Brief hospital course: ICU transfer on 09/26/2022.  Taken from prior notes.  Katherine Carter is a 72 year old female with a past medical history significant for chronic hypoxic respiratory failure requiring 4 L supplemental oxygen due to COPD, diabetes mellitus, hypertension, GERD who presents to [facility] ED on 09/21/2022 due to generalized weakness status post multiple falls, dyspnea, and cough.  Initially admitted in ICU for concern of acute on chronic hypoxic respiratory failure and severe sepsis secondary to multifocal pneumonia, initially requiring BiPAP.  Also found to have AKI and concern of mild DKA.  Patient also had anion gap metabolic acidosis along with hyper kalemia and mild hyponatremia on admission.  Chest x-ray with new patchy right greater than left perihilar opacities and pulmonary vascular congestion.  CT head was without any acute abnormality.  CT cervical spine was without any acute fracture, did show some chronic changes with multilevel spinal canal stenosis and a disc protrusion at C3-C4 level.  CT of chest was negative for PE but did show dense consolidation in the right lung likely representing pneumonia along with emphysematous changes in the lungs.  Respiratory panel was negative.  Blood cultures remain negative.  Urine cultures with less than 10,000 colonies/insignificant growth.  Respiratory viral panel negative.  Strep pneumo and Legionella urinary antigens negative.  MRSA PCR negative.  Patient initially received cefepime and vancomycin and Zithromax, vancomycin was discontinued and she continued with Zithromax and cefepime since 09/21/2022.  Patient remained on BiPAP until 09/25/2022 and able to wean off to high flow nasal cannula. On 09/24/2022 she developed projectile vomiting and abdominal distention, CT abdomen  and pelvis revealed small bowel obstruction and general surgery was consulted.  KUB on 1/22 continued to show SBO but she started having bowel movement and nausea, vomiting and abdominal pain resolved.  CT abdomen with also concern of a 2.5 cm iliac aneurysm with distal thrombosis.  Vascular surgery was consulted as on exam she has V-Care Doppler pulses on the right lower extremity. Per vascular surgery note due to current respiratory status and no immediate danger to limb for critical ischemia, they are recommending outpatient follow-up with a possible right lower extremity angiogram and PCI.  Patient was also started on TPN for nutritional support on 09/25/2022.  Today patient remained  tachycardic and tachypneic, requiring up to 6 L of oxygen with high flow.  CMP this morning shows elevated blood glucose at 570, which has been improved to 205 with intervention.  CBC with improving leukocytosis at 11.8, rest of the labs unremarkable.  Follow-up chest x-ray today with stable right perihilar and upper lobe opacity. KUB with stable small bowel dilatation.  No bowel movement since this morning. Remained n.p.o. and on TPN. Starting her on metoprolol 5 mg every 8 hourly and LR at 75 mL/h as she clinically appears dry. Another message sent to general surgery for their follow-up recommendations.     Assessment and Plan: * Acute on chronic respiratory failure with hypoxia (HCC) Most likely secondary to pneumonia.  Initially requiring BiPAP, currently on 5 to 6 L of oxygen with high flow.  Baseline oxygen use of 4 L. -Continue with bronchodilators -Continue with supportive care -Continue with supplemental oxygen-wean to baseline as tolerated  Sepsis due to pneumonia [facility]) Patient met sepsis criteria with leukocytosis, tachycardia and tachypnea on admission.  Most likely secondary  to multifocal pneumonia. Strep pneumo and Legionella negative.  Cultures remain negative. Patient was treated with  cefepime and Zithromax.  Completed the course of Zithromax. -Continue with cefepime for another day to complete the course  Ileus [facility]) Patient developed ileus most likely secondary to acute illness.  No surgery done. General surgery was consulted, serial KUB with stable bowel dilatation. Bowel sounds hypoactive and had a bowel movement yesterday with suppository. General surgery was consulted and they were advising gut rest. Patient was started on TPN. 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  Decreased right lower extremity pulses but patient is not with critical limb ischemia so they are recommending outpatient follow-up and a possible right lower extremity angiography and PCI. -Restart home aspirin -Continue Lipitor  Uncontrolled type 2 diabetes mellitus with hyperglycemia, without long-term current use of insulin (HCC) -Continue Levemir at 21 units daily -Continue with SSI  Protein-calorie malnutrition, severe Estimated body mass index is 25.93 kg/m as calculated from the following:   Height as of this encounter: 5\' 3"  (1.6 m).   Weight as of this encounter: 66.4 kg.   -Patient is currently on TPN for nutritional support. -Continue TPN-we will start p.o. per surgery recommendations  HTN (hypertension) Blood pressure elevated.  Home medications are currently on hold due to n.p.o. status. -Adding metoprolol 5 mg every 8 hourly. -As needed labetalol and hydralazine -Continue amlodipine -We will add back losartan if needed      Subjective: Patient was seen and examined today.  Denies any abdominal pain, nausea or vomiting.  She was feeling thirsty.  Last bowel movement yesterday after getting a suppository  Physical Exam: Vitals:   09/26/22 0726 09/26/22 0800 09/26/22 0900 09/26/22 1000  BP:  (!) 177/107 131/83 (!) 150/80  Pulse:  (!) 134 (!) 123  (!) 125  Resp:  (!) 31 (!) 23 (!) 24  Temp:      TempSrc:      SpO2: 95% 91% 93% 96%  Weight:      Height:       General.  Frail elderly lady, in no acute distress. Pulmonary.  Lungs clear bilaterally, normal respiratory effort. CV.  Sinus tachycardia Abdomen.  Soft, mildly tympanic with mild distention, nontender, bowel sounds hypoactive. CNS.  Alert and oriented .  No focal neurologic deficit. Extremities.  No edema, no cyanosis, diminished pulses on right foot Psychiatry.  Appears to have some cognitive impairment.   Data Reviewed: Prior data reviewed  Family Communication: Called husband with no response.  Disposition: Status is: Inpatient Remains inpatient appropriate because: Severity of illness  Planned Discharge Destination: Home with Home Health  DVT prophylaxis.  Lovenox Time spent: 50 minutes  This record has been created using Systems analyst. Errors have been sought and corrected,but may not always be located. Such creation errors do not reflect on the standard of care.   Author: Lorella Nimrod, MD 09/26/2022 1:58 PM  For on call review [email or web address].

## 2022-09-26 NOTE — Hospital Course (Addendum)
ICU transfer on 09/26/2022.  Taken from prior notes.  Katherine Carter is a 72 year old female with a past medical history significant for chronic hypoxic respiratory failure requiring 4 L supplemental oxygen due to COPD, diabetes mellitus, hypertension, GERD who presents to [facility] ED on 09/21/2022 due to generalized weakness status post multiple falls, dyspnea, and cough.  Initially admitted in ICU for concern of acute on chronic hypoxic respiratory failure and severe sepsis secondary to multifocal pneumonia, initially requiring BiPAP.  Also found to have AKI and concern of mild DKA.  Patient also had anion gap metabolic acidosis along with hyper kalemia and mild hyponatremia on admission.  Chest x-ray with new patchy right greater than left perihilar opacities and pulmonary vascular congestion.  CT head was without any acute abnormality.  CT cervical spine was without any acute fracture, did show some chronic changes with multilevel spinal canal stenosis and a disc protrusion at C3-C4 level.  CT of chest was negative for PE but did show dense consolidation in the right lung likely representing pneumonia along with emphysematous changes in the lungs.  Respiratory panel was negative.  Blood cultures remain negative.  Urine cultures with less than 10,000 colonies/insignificant growth.  Respiratory viral panel negative.  Strep pneumo and Legionella urinary antigens negative.  MRSA PCR negative.  Patient initially received cefepime and vancomycin and Zithromax, vancomycin was discontinued and she continued with Zithromax and cefepime since 09/21/2022.  Patient remained on BiPAP until 09/25/2022 and able to wean off to high flow nasal cannula. On 09/24/2022 she developed projectile vomiting and abdominal distention, CT abdomen and pelvis revealed small bowel obstruction and general surgery was consulted.  KUB on 1/22 continued to show SBO but she started having bowel movement and nausea, vomiting and abdominal  pain resolved.  CT abdomen with also concern of a 2.5 cm iliac aneurysm with distal thrombosis.  Vascular surgery was consulted as on exam she has V-Care Doppler pulses on the right lower extremity. Per vascular surgery note due to current respiratory status and no immediate danger to limb for critical ischemia, they are recommending outpatient follow-up with a possible right lower extremity angiogram and PCI.  Patient was also started on TPN for nutritional support on 09/25/2022.  Today patient remained  tachycardic and tachypneic, requiring up to 6 L of oxygen with high flow.  CMP this morning shows elevated blood glucose at 570, which has been improved to 205 with intervention.  CBC with improving leukocytosis at 11.8, rest of the labs unremarkable.  Follow-up chest x-ray today with stable right perihilar and upper lobe opacity. KUB with stable small bowel dilatation.  No bowel movement since this morning. Remained n.p.o. and on TPN. Starting her on metoprolol 5 mg every 8 hourly and LR at 75 mL/h as she clinically appears dry. Another message sent to general surgery for their follow-up recommendations.  1/24: Remained mildly tachycardic and tachypneic.  Repeat chest x-ray with right upper lung consolidation and KUB with persistent SBO/ileus.  No free air identified.  Saturating well on baseline oxygen of 4 L.  General surgery did a contrast CT abdomen which shows small bowel obstruction with transition point in the distal ileum in the right lower quadrant, unchanged from prior.  Stable right common iliac artery aneurysm.  Still no flatus or BM. Having on and off BiPAP to help with breathing.  Chest x-ray with few basal crackles, stopping IV fluid.  Checking BNP with morning labs Goals of care discussion still pending-palliative care is on board.  1/25: Patient remained mildly tachycardic and tachypneic.  Started passing flatus.  KUB with persistent ileitis/partial SBO.  Not a good surgical  candidate, so they will continue conservative management and TPN.  Saturating well on baseline oxygen requirement of 4 L. Will complete the course of antibiotic today.  1/26: Hemodynamically stable, continued to have mild tachycardia.  Gastrografin studies with contrast in colon.  KUB today with persistently dilated small bowel.  Passing flatus and had a small bowel movement yesterday.  Not a good surgical candidate, will continue with conservative management.  She was also started on clear liquid diet today.  Patient is high risk for deterioration and mortality.

## 2022-09-26 NOTE — Assessment & Plan Note (Signed)
Patient met sepsis criteria with leukocytosis, tachycardia and tachypnea on admission.  Most likely secondary to multifocal pneumonia. Strep pneumo and Legionella negative.  Cultures remain negative. Patient was treated with cefepime and Zithromax.  Completed the course of Zithromax. -Continue with cefepime for another day to complete the course

## 2022-09-26 NOTE — Progress Notes (Signed)
Pt had a vomiting episode this morning around 0540 when turning to clean her up. prn Zofran was given and pt remains in an upright position stating it feels better.   Throughout the night pt's BP was also elevated with prn labetalol given twice, one at 0005 and the next dose at 0404, however BP didn't decrease and remained in the 185U systolic, prn hydralazine was given at 0438 and BP is back down to the 140s. Pt claimed of no pain through out the night, just nauseous this morning. Vitals remain stable and O2 stats remain >90%.

## 2022-09-26 NOTE — Assessment & Plan Note (Signed)
Estimated body mass index is 25.93 kg/m as calculated from the following:   Height as of this encounter: 5\' 3"  (1.6 m).   Weight as of this encounter: 66.4 kg.   -Patient is currently on TPN for nutritional support. -Continue TPN-we will start p.o. per surgery recommendations

## 2022-09-26 NOTE — Assessment & Plan Note (Signed)
Most likely secondary to pneumonia.  Initially requiring BiPAP, currently on 5 to 6 L of oxygen with high flow.  Baseline oxygen use of 4 L. -Continue with bronchodilators -Continue with supportive care -Continue with supplemental oxygen-wean to baseline as tolerated

## 2022-09-26 NOTE — TOC Initial Note (Signed)
Transition of Care [facility]) - Initial/Assessment Note    Patient Details  Name: Katherine Carter MRN: [id number] Date of Birth: May 10, 1951  Transition of Care [facility]) CM/SW Contact:    Shelbie Hutching, RN Phone Number: 09/26/2022, 11:22 AM  Clinical Narrative:                 Patient admitted to the hospital for acute on chronic respiratory failure currently in the ICU on HFNC 7L.  Patient is from home with her husband.  Current recommendation is for home health at discharge.  TOC will cont to follow.  Patient currently only oriented to person.   Expected Discharge Plan: [place] Barriers to Discharge: Continued Medical Work up   Patient Goals and CMS Choice   CMS Medicare.gov Compare Post Acute Care list provided to:: Patient Choice offered to / list presented to : Patient, Spouse      Expected Discharge Plan and Services   Discharge Planning Services: CM Consult Post Acute Care Choice: [place] arrangements for the past 2 months: Single Family Home                 DME Arranged: N/A DME Agency: NA                  Prior Living Arrangements/Services Living arrangements for the past 2 months: Single Family Home Lives with:: Spouse Patient language and need for interpreter reviewed:: No        Need for Family Participation in Patient Care: Yes (Comment) Care giver support system in place?: Yes (comment)   Criminal Activity/Legal Involvement Pertinent to Current Situation/Hospitalization: No - Comment as needed  Activities of Daily Living Home Assistive Devices/Equipment: Oxygen ADL Screening (condition at time of admission) Patient's cognitive ability adequate to safely complete daily activities?: Yes Is the patient deaf or have difficulty hearing?: No Does the patient have difficulty seeing, even when wearing glasses/contacts?: No Does the patient have difficulty concentrating, remembering, or making decisions?: No Patient able  to express need for assistance with ADLs?: Yes Does the patient have difficulty dressing or bathing?: Yes Independently performs ADLs?: No Does the patient have difficulty walking or climbing stairs?: Yes Weakness of Legs: Both Weakness of Arms/Hands: Both  Permission Sought/Granted Permission sought to share information with : Family Supports    Share Information with NAME: Colisha Redler     Permission granted to share info w Relationship: spouse  Permission granted to share info w Contact Information: [phone]  Emotional Assessment Appearance:: Appears stated age     Orientation: : Oriented to Self Alcohol / Substance Use: Not Applicable Psych Involvement: No (comment)  Admission diagnosis:  Respiratory acidosis [E36.62] Metabolic acidosis [H47.65] Hyperglycemia [R73.9] Acute on chronic respiratory failure with hypoxia ([place]) [J96.21] Pneumonia due to infectious organism, unspecified laterality, unspecified part of lung [J18.9] Sepsis, due to unspecified organism, unspecified whether acute organ dysfunction present [facility]) [A41.9] Patient Active Problem List   Diagnosis Date Noted   Protein-calorie malnutrition, severe 09/26/2022   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[place]) 09/25/2022   Ileus, postoperative ([place]) 09/24/2022   Acute on chronic respiratory failure with hypoxia ([place]) 09/21/2022   Pseudohyponatremia 06/06/2022   E-coli UTI    Uncontrolled type 2 diabetes mellitus with hyperglycemia, without long-term current use of insulin ([place]) 06/05/2022   COVID-19 virus infection 06/05/2022   Hyperkalemia 06/04/2022   AKI (acute kidney injury) ([place]) 06/04/2022   Acute bilateral lower abdominal pain 06/04/2022   Pure hypercholesterolemia  Elevated troponin    Acute respiratory failure with hypoxemia ([place]) 09/02/2021   Sepsis ([place]) 07/08/2018   CAP (community acquired pneumonia) 07/08/2018   HTN (hypertension) 07/08/2018   COPD with acute exacerbation ([place]) 07/08/2018   Acute  respiratory failure with hypoxia and hypercapnia ([place]) 12/10/2015   PCP:  Donnie Coffin, MD Pharmacy:   [id number] - [place], [address] [place] [address] Phone: [phone] Fax: [place] [address], [place] [address] Phone: [phone] Fax: [phone]     Social Determinants of Health (SDOH) Social History: SDOH Screenings   Food Insecurity: No Food Insecurity (09/23/2022)  Housing: Low Risk  (09/23/2022)  Transportation Needs: No Transportation Needs (09/23/2022)  Utilities: Not At Risk (09/23/2022)  Tobacco Use: Medium Risk (09/23/2022)   SDOH Interventions:     Readmission Risk Interventions     No data to display

## 2022-09-26 NOTE — Assessment & Plan Note (Signed)
Vascular surgery was consulted when 2.5 cm aneurysm of iliac artery was noted on CT abdomen.  Decreased right lower extremity pulses but patient is not with critical limb ischemia so they are recommending outpatient follow-up and a possible right lower extremity angiography and PCI. -Restart home aspirin -Continue Lipitor

## 2022-09-27 ENCOUNTER — Inpatient Hospital Stay: Payer: Medicare Other

## 2022-09-27 DIAGNOSIS — K567 Ileus, unspecified: Secondary | ICD-10-CM | POA: Diagnosis not present

## 2022-09-27 DIAGNOSIS — A419 Sepsis, unspecified organism: Secondary | ICD-10-CM | POA: Diagnosis not present

## 2022-09-27 DIAGNOSIS — J9621 Acute and chronic respiratory failure with hypoxia: Secondary | ICD-10-CM | POA: Diagnosis not present

## 2022-09-27 DIAGNOSIS — J189 Pneumonia, unspecified organism: Secondary | ICD-10-CM | POA: Diagnosis not present

## 2022-09-27 LAB — CBC
HCT: 37.1 % (ref 36.0–46.0)
Hemoglobin: 11.3 g/dL — ABNORMAL LOW (ref 12.0–15.0)
MCH: 28 pg (ref 26.0–34.0)
MCHC: 30.5 g/dL (ref 30.0–36.0)
MCV: 92.1 fL (ref 80.0–100.0)
Platelets: 324 10*3/uL (ref 150–400)
RBC: 4.03 MIL/uL (ref 3.87–5.11)
RDW: 15.8 % — ABNORMAL HIGH (ref 11.5–15.5)
WBC: 12.2 10*3/uL — ABNORMAL HIGH (ref 4.0–10.5)
nRBC: 0 % (ref 0.0–0.2)

## 2022-09-27 LAB — GLUCOSE, CAPILLARY
Glucose-Capillary: 132 mg/dL — ABNORMAL HIGH (ref 70–99)
Glucose-Capillary: 157 mg/dL — ABNORMAL HIGH (ref 70–99)
Glucose-Capillary: 169 mg/dL — ABNORMAL HIGH (ref 70–99)
Glucose-Capillary: 195 mg/dL — ABNORMAL HIGH (ref 70–99)
Glucose-Capillary: 208 mg/dL — ABNORMAL HIGH (ref 70–99)
Glucose-Capillary: 95 mg/dL (ref 70–99)

## 2022-09-27 MED ORDER — IOHEXOL 300 MG/ML  SOLN
100.0000 mL | Freq: Once | INTRAMUSCULAR | Status: AC | PRN
  Administered 2022-09-27: 100 mL via INTRAVENOUS

## 2022-09-27 MED ORDER — METOPROLOL TARTRATE 5 MG/5ML IV SOLN
5.0000 mg | Freq: Three times a day (TID) | INTRAVENOUS | Status: DC
  Administered 2022-09-27 – 2022-10-05 (×25): 5 mg via INTRAVENOUS
  Filled 2022-09-27 (×25): qty 5

## 2022-09-27 MED ORDER — IOHEXOL 9 MG/ML PO SOLN
500.0000 mL | ORAL | Status: DC
  Administered 2022-09-27: 500 mL via ORAL

## 2022-09-27 MED ORDER — TRACE MINERALS CU-MN-SE-ZN 300-55-60-3000 MCG/ML IV SOLN
INTRAVENOUS | Status: AC
  Filled 2022-09-27: qty 576

## 2022-09-27 NOTE — Progress Notes (Signed)
Progress Note   Patient: Katherine Carter VOJ:[id number] DOB: 1951/02/19 DOA: 09/21/2022     6 DOS: the patient was seen and examined on 09/27/2022   Brief hospital course: ICU transfer on 09/26/2022.  Taken from prior notes.  Katherine Carter is a 72 year old female with a past medical history significant for chronic hypoxic respiratory failure requiring 4 L supplemental oxygen due to COPD, diabetes mellitus, hypertension, GERD who presents to [facility] ED on 09/21/2022 due to generalized weakness status post multiple falls, dyspnea, and cough.  Initially admitted in ICU for concern of acute on chronic hypoxic respiratory failure and severe sepsis secondary to multifocal pneumonia, initially requiring BiPAP.  Also found to have AKI and concern of mild DKA.  Patient also had anion gap metabolic acidosis along with hyper kalemia and mild hyponatremia on admission.  Chest x-ray with new patchy right greater than left perihilar opacities and pulmonary vascular congestion.  CT head was without any acute abnormality.  CT cervical spine was without any acute fracture, did show some chronic changes with multilevel spinal canal stenosis and a disc protrusion at C3-C4 level.  CT of chest was negative for PE but did show dense consolidation in the right lung likely representing pneumonia along with emphysematous changes in the lungs.  Respiratory panel was negative.  Blood cultures remain negative.  Urine cultures with less than 10,000 colonies/insignificant growth.  Respiratory viral panel negative.  Strep pneumo and Legionella urinary antigens negative.  MRSA PCR negative.  Patient initially received cefepime and vancomycin and Zithromax, vancomycin was discontinued and she continued with Zithromax and cefepime since 09/21/2022.  Patient remained on BiPAP until 09/25/2022 and able to wean off to high flow nasal cannula. On 09/24/2022 she developed projectile vomiting and abdominal distention, CT abdomen  and pelvis revealed small bowel obstruction and general surgery was consulted.  KUB on 1/22 continued to show SBO but she started having bowel movement and nausea, vomiting and abdominal pain resolved.  CT abdomen with also concern of a 2.5 cm iliac aneurysm with distal thrombosis.  Vascular surgery was consulted as on exam she has [facility] Doppler pulses on the right lower extremity. Per vascular surgery note due to current respiratory status and no immediate danger to limb for critical ischemia, they are recommending outpatient follow-up with a possible right lower extremity angiogram and PCI.  Patient was also started on TPN for nutritional support on 09/25/2022.  Today patient remained  tachycardic and tachypneic, requiring up to 6 L of oxygen with high flow.  CMP this morning shows elevated blood glucose at 570, which has been improved to 205 with intervention.  CBC with improving leukocytosis at 11.8, rest of the labs unremarkable.  Follow-up chest x-ray today with stable right perihilar and upper lobe opacity. KUB with stable small bowel dilatation.  No bowel movement since this morning. Remained n.p.o. and on TPN. Starting her on metoprolol 5 mg every 8 hourly and LR at 75 mL/h as she clinically appears dry. Another message sent to general surgery for their follow-up recommendations.  1/24: Remained mildly tachycardic and tachypneic.  Repeat chest x-ray with right upper lung consolidation and KUB with persistent SBO/ileus.  No free air identified.  Saturating well on baseline oxygen of 4 L.  General surgery did a contrast CT abdomen which shows small bowel obstruction with transition point in the distal ileum in the right lower quadrant, unchanged from prior.  Stable right common iliac artery aneurysm.  Still no flatus or BM. Having on and  off BiPAP to help with breathing.  Chest x-ray with few basal crackles, stopping IV fluid.  Checking BNP with morning labs Goals of care discussion still  pending-palliative care is on board  Patient is high risk for deterioration and mortality.   Assessment and Plan: * Acute on chronic respiratory failure with hypoxia (HCC) Most likely secondary to pneumonia.  Initially requiring BiPAP, currently on 5 to 6 L of oxygen with high flow.  Baseline oxygen use of 4 L.  Continued to require intermittent BiPAP to help with breathing.  Still tachycardic and tachypneic -Continue with bronchodilators -Continue with supportive care -Continue with supplemental oxygen-wean to baseline as tolerated  Sepsis due to pneumonia [facility]) Patient met sepsis criteria with leukocytosis, tachycardia and tachypnea on admission.  Most likely secondary to multifocal pneumonia. Strep pneumo and Legionella negative.  Cultures remain negative. Patient was treated with cefepime and Zithromax.  Completed the course of Zithromax. -Continue with cefepime for another day to complete the course  Ileus [facility]) Patient developed ileus most likely secondary to acute illness.  No surgery done. General surgery was consulted, serial KUB with stable bowel dilatation. Repeat CT abdomen with oral contrast with persistence of SBO with a transition point Bowel sounds hypoactive and no flatus or BM. General surgery was consulted and they were advising gut rest, and she was started on TPN -Continue with TPN -Follow general surgery recommendations  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[place]) Vascular surgery was consulted when 2.5 cm aneurysm of iliac artery was noted on CT abdomen.  Decreased right lower extremity pulses but patient is not with critical limb ischemia so they are recommending outpatient follow-up and a possible right lower extremity angiography and PCI. -Restart home aspirin -Continue Lipitor  Uncontrolled type 2 diabetes mellitus with hyperglycemia, without long-term current use of insulin (HCC) -Continue Levemir at 21 units daily -Continue with SSI  Protein-calorie malnutrition,  severe Estimated body mass index is 25.93 kg/m as calculated from the following:   Height as of this encounter: 5\' 3"  (1.6 m).   Weight as of this encounter: 66.4 kg.   -Patient is currently on TPN for nutritional support. -Continue TPN-we will start p.o. per surgery recommendations  HTN (hypertension) Blood pressure elevated.  Home medications are currently on hold due to n.p.o. status. -Adding metoprolol 5 mg every 8 hourly. -As needed labetalol and hydralazine -Continue amlodipine -We will add back losartan if needed      Subjective: Patient was seen and examined today.  Denies any pain.  Not passing any flatus and no BM yet.  Physical Exam: Vitals:   09/27/22 1200 09/27/22 1300 09/27/22 1339 09/27/22 1400  BP: (!) 148/91 (!) 155/89  (!) 151/93  Pulse: (!) 129 (!) 125  (!) 129  Resp: (!) 29 (!) 24  (!) 22  Temp:      TempSrc:      SpO2: 92% 91% 94% 95%  Weight:      Height:       General.  Frail elderly lady, in no acute distress. Pulmonary.  Lungs clear bilaterally, normal respiratory effort. CV.  Regular rate and rhythm, no JVD, rub or murmur. Abdomen.  Soft, nontender, nondistended, BS positive. CNS.  Alert and oriented .  No focal neurologic deficit. Extremities.  No edema, no cyanosis, pulses intact and symmetrical. Psychiatry.  Appears to have some cognitive impairment  Data Reviewed: Prior data reviewed  Family Communication: Called husband with no response.  Disposition: Status is: Inpatient Remains inpatient appropriate because: Severity of  illness  Planned Discharge Destination: Home with Home Health  DVT prophylaxis.  Lovenox Time spent: 50 minutes  This record has been created using Systems analyst. Errors have been sought and corrected,but may not always be located. Such creation errors do not reflect on the standard of care.   Author: Lorella Nimrod, MD 09/27/2022 3:41 PM  For on call review [email or web address].

## 2022-09-27 NOTE — Consult Note (Signed)
Consultation Note Date: 09/27/2022 at 1015  Patient Name: Katherine Carter  DOB: [birth year]  MRN: [id number]  Age / Sex: 72 y.o., female  PCP: Donnie Coffin, MD Referring Physician: Lorella Nimrod, MD  Reason for Consultation: Establishing goals of care  HPI/Patient Profile: 72 y.o. female  with past medical history of COPD (4L at baseline), type 2 diabetes, HTN, GERD, former smoker, and hysterectomy admitted on 09/21/2022 with cough, shortness of breath, poor p.o. intake, and weakness with falls at home.  Patient is being treated for severe sepsis with suspected multifocal pneumonia, AECOPD, and AKI with concern for mild DKA.  CT of abdomen has revealed SBO.  Surgery has been consulted and recommends conservative measures with bowel rest and serial abdominal x-rays.  PMT was consulted to discuss goals of care.   Clinical Assessment and Goals of Care: I have reviewed medical records including EPIC notes, labs and imaging, assessed the patient and then met with patient at bedside to discuss diagnosis prognosis, GOC, EOL wishes, disposition and options.  She is more alert and awake than my initial visit yesterday.  She acknowledges my presence and is able to make her wishes known.  No family or friends are present at bedside.  She is requesting that the air conditioning be turned off so that she can get warmer.  I introduced Palliative Medicine as specialized medical care for people living with serious illness. It focuses on providing relief from the symptoms and stress of a serious illness. The goal is to improve quality of life for both the patient and the family.  We discussed a brief life review of the patient.  Patient shares she has been married for over 40 years.  She endorses that she has 4 children, who all still live nearby.  She also endorses that she has 3 sisters and 1 brother.  As far as  functional and nutritional status patient says she was doing "just fine at home".  She denies difficulty with ambulation, poor p.o. intake, or memory deficits/confusion.  We discussed patient's current illness and what it means in the larger context of patient's on-going co-morbidities.  I attempted to gauge patient's current understanding of her medical situation.  She says that her scans have all "looked good".  When asked to further explain, patient says she does not know.  Education provided on small bowel obstruction, abdominal distention, and conservative measures. She said "that sounds good" but did not ask any additional/follow-up questions.   I attempted to elicit values and goals of care important to the patient.  She shares she just wants to get warm.    When asked if patient has ever discussed her medical wishes or advanced care planning, she shares no. Advance directives, concepts specific to code status, artificial feeding and hydration, and rehospitalization were considered and discussed. Patient deferred discussing these topics and asked that I adjust the temperature in her room.   Family is facing treatment option decisions, advanced directive, and anticipatory care needs.   I counseled with  CCM Dr. Mortimer Fries. Ongoing education and [place] discussions to continue. I again attempted to speak with patient's husband but no answer. No contact info for sisters found in chart. I requested that [place], if able, let me know if/when family comes to bedside.    Discussed with patient the importance of continued conversation with family and the medical providers regarding overall plan of care and treatment options, ensuring decisions are within the context of the patient's values and GOCs.   She does not appear to be fully processing our discussion but is pleasant, cooperative, and thanked me for my visit.   PMT will continue to follow.   Physical Exam Vitals reviewed.  Constitutional:       General: She is not in acute distress.    Appearance: She is normal weight. She is not ill-appearing.  HENT:     Mouth/Throat:     Mouth: Mucous membranes are moist.  Eyes:     Pupils: Pupils are equal, round, and reactive to light.  Cardiovascular:     Rate and Rhythm: Tachycardia present.     Pulses: Normal pulses.  Pulmonary:     Effort: Pulmonary effort is normal.  Abdominal:     General: There is distension.     Tenderness: There is no abdominal tenderness.  Musculoskeletal:     Comments: Generalized weakness, MAETC  Skin:    General: Skin is warm and dry.  Neurological:     Mental Status: She is alert.     Comments: Oriented to self and place  Psychiatric:        Mood and Affect: Mood normal.     Palliative Assessment/Data:     Thank you for this consult. Palliative medicine will continue to follow and assist holistically.   Time Total: 75 minutes Greater than 50%  of this time was spent counseling and coordinating care related to the above assessment and plan.  Signed by: Jordan Hawks, DNP, FNP-BC Palliative Medicine    Please contact Palliative Medicine Team phone at [phone] for questions and concerns.  For individual provider: See Shea Evans

## 2022-09-27 NOTE — Progress Notes (Signed)
[facility] SURGICAL PROGRESS NOTE (cpt [id number])  Hospital Day(s): 6.   Interval History: Patient seen and examined, no acute events or new complaints overnight. Patient reports she is doing well; complaints of wheezing this AM. No abdominal pain but certainly remains distended. No fever, chills, or emesis. Still with stable leukocytosis; 12.2K. No additional labs. She reports flatus but is not a reliable historian.   Review of Systems:  Constitutional: denies fever, chills  HEENT: denies cough or congestion  Respiratory: denies any shortness of breath; + wheezing Cardiovascular: denies chest pain or palpitations  Gastrointestinal: + distension, denied abdominal pain, nausea, emesis Genitourinary: denies burning with urination or urinary frequency Musculoskeletal: denies pain, decreased motor or sensation  Vital signs in last 24 hours: [min-max] current  Temp:  [97.6 F (36.4 C)-98.7 F (37.1 C)] 98.3 F (36.8 C) (01/24 0500) Pulse Rate:  [86-136] 123 (01/24 0600) Resp:  [14-31] 24 (01/24 0600) BP: (131-179)/(80-107) 179/96 (01/24 0600) SpO2:  [91 %-99 %] 96 % (01/24 0734) FiO2 (%):  [40 %] 40 % (01/23 2102)     Height: 5\' 3"  (160 cm) Weight: 66.4 kg BMI (Calculated): 25.94   Intake/Output last 2 shifts:  01/23 0701 - 01/24 0700 In: 1807.4 [I.V.:1357.4; IV Piggyback:200] Out: 700 [Urine:700]   Physical Exam:  Constitutional: alert, cooperative and no distress  HENT: normocephalic without obvious abnormality  Eyes: PERRL, EOM's grossly intact and symmetric  Respiratory: breathing non-labored at rest; expiratory wheezing heard in all fields Cardiovascular: tachycardic and sinus rhythm  Gastrointestinal: soft, non-tender, she remains distended, no rebound/guarding. Known umbilical hernia, soft  Musculoskeletal: no edema or wounds, motor and sensation grossly intact, NT    Labs:     Latest Ref Rng & Units 09/27/2022    5:23 AM 09/26/2022    3:48 AM 09/25/2022     4:45 AM  CBC  WBC 4.0 - 10.5 K/uL 12.2  11.8  15.9   Hemoglobin 12.0 - 15.0 g/dL 11.3  11.1  10.9   Hematocrit 36.0 - 46.0 % 37.1  36.2  34.5   Platelets 150 - 400 K/uL 324  317  301       Latest Ref Rng & Units 09/26/2022    4:52 AM 09/26/2022    3:48 AM 09/25/2022    3:45 PM  CMP  Glucose 70 - 99 mg/dL 248  570  141   BUN 8 - 23 mg/dL  42  37   Creatinine 0.44 - 1.00 mg/dL  0.90  0.89   Sodium 135 - 145 mmol/L  141  143   Potassium 3.5 - 5.1 mmol/L  5.0  4.0   Chloride 98 - 111 mmol/L  108  111   CO2 22 - 32 mmol/L  26  23   Calcium 8.9 - 10.3 mg/dL  10.1  10.2   Total Protein 6.5 - 8.1 g/dL  7.8  8.0   Total Bilirubin 0.3 - 1.2 mg/dL  0.5  0.8   Alkaline Phos 38 - 126 U/L  92  92   AST 15 - 41 U/L  36  34   ALT 0 - 44 U/L  50  52      Imaging studies:   KUB (09/27/2022) personally reviewed showing stable appearance of dilated small bowel, no free air, no pneumatosis, and radiologist report reviewed below:  IMPRESSION: Right upper lung consolidation. Hyperlucency suggesting COPD. Dilated small bowel consistent with obstruction or ileus. No free air identified.   Assessment/Plan: (ICD-10's: K56.7) 72  y.o. female with likely ileus secondary to acute medical issues, complicated by exacerbated COPD and PNA   - Will repeat CT Abdomen/Pelvis with PO contrast as tolerated this morning to reassess abdominal process   - Reasonable to do sips of water, Boost breeze, ice chips. Would hold on formal diet order right now.   - No need for NGT at this moment; follow up CT  - No role for surgical intervention at this time; She is a very sub-optimal surgical candidate - Continue TPN  - Monitor abdominal examination; on-going bowel function   - Pain control prn; limit narcotics - Antiemetics prn - Mobilize; engage therapies; needs to get OOB  - Further management per primary service; we will follow   All of the above findings and recommendations were discussed with the patient, and  the medical team, and all of patient's questions were answered to her expressed satisfaction.  -- Edison Simon, PA-C [facility] 09/27/2022, 7:37 AM M-F: 7am - 4pm

## 2022-09-27 NOTE — Assessment & Plan Note (Signed)
Patient met sepsis criteria with leukocytosis, tachycardia and tachypnea on admission.  Most likely secondary to multifocal pneumonia. Strep pneumo and Legionella negative.  Cultures remain negative. Patient was treated with cefepime and Zithromax.  Completed the course of Zithromax. -Continue with cefepime for another day to complete the course 

## 2022-09-27 NOTE — Assessment & Plan Note (Addendum)
Improved.  Currently saturating at baseline oxygen requirement of 4 L Who most likely secondary to pneumonia.  Initially requiring BiPAP,  Still tachycardic and tachypneic although improving than before -Continue with bronchodilators -Continue with supportive care -Continue with supplemental oxygen-wean to baseline as tolerated

## 2022-09-27 NOTE — Progress Notes (Signed)
OT Cancellation Note  Patient Details Name: Katherine Carter MRN: [id number] DOB: 1951/09/02   Cancelled Treatment:    Reason Eval/Treat Not Completed: Medical issues which prohibited therapy. Patient now back to bed and on BiPAP. OT will hold and follow up as appropriate.   Dessie Coma, M.S. OTR/L  09/27/22, 1:27 PM  ascom [phone]

## 2022-09-27 NOTE — Progress Notes (Addendum)
[id number] patient found up in chair by herself. Patient is completely alert and oriented. States she just wanted to get OOB this morning. 0700 Patient begging to go back to bed. Nurse and NT on standby while patient put herself back in bed. Patient slightly wobbly but put self back to bed. Then patient begged to be pulled up in bed. Pulled up in bed. PICC line wrapped in coban to prevent dislodgement. Pulled off all monitoring equipment because it bothered her. Monitoring equipment replaced. Bed alarm on. 1430 Down to CT for another CT of abdomen. Patient drank 2 swallows of contrast. Dr. Dahlia Byes made aware. 1600 Slept all afternoon. 1700 Family in to visit. Patient passing gas.

## 2022-09-27 NOTE — Consult Note (Signed)
PHARMACY - TOTAL PARENTERAL NUTRITION CONSULT NOTE   Indication: Small bowel obstruction  Patient Measurements: Height: 5\' 3"  (160 cm) Weight: 66.4 kg (146 lb 6.2 oz) IBW/kg (Calculated) : 52.4 TPN AdjBW (KG): 56.7 Body mass index is 25.93 kg/m. Usual Weight: 70-72 kg  Assessment:  72 y.o. female history of COPD admitted for acute on chronic respiratory failure and sepsis likely from multifocal pneumonia.  Was found to have acute kidney injury.  She was transferred to the ICU last couple of days and yesterday started having some abdominal discomfort and this morning having more abdominal pain.  The pain was diffuse intermittent moderate intensity.  There was a CT scan that was obtained that have personally reviewed showing evidence of dilated loops of bowel but no evidence of pneumatosis or closed-loop obstruction. NG tube could not be placed.  She now feels better.  She continues to have respiratory issues.  She denies any abdominal pain.  She did have a bowel movement and is passing flatus. Pharmacy has been consulted to initiate TPN as patient has not eaten in~5 days  Glucose / Insulin: BG 141-248, insulin last 24h: 34 units Electrolytes: currently WNL Renal: SCR<1, stable Hepatic: AST/ALT WNL Intake / Output; MIVF: net (-2L), no IVF currently ordered GI Imaging: 1/21 CT of Abdomen: Examination is positive for small bowel obstruction with transition to diminished caliber distal small bowel within the right lower quadrant of the abdomen at the level of distal ileum. GI Surgeries / Procedures:  No current surgical intervention planned  Central access: Patient with PICC already placed prior to admission TPN start date: 09/25/22  Nutritional Goals: Goal TPN rate is 60 mL/hr (provides 86.4 g of protein and 1685 kcals per day)  RD Assessment: Kcal goal: 1600-1800 kcals/day Protein: 80-90 g/day Fluids: 1.4-1.6 L/day Estimated Needs Total Energy Estimated Needs:  1600-1800kcal/day Total Protein Estimated Needs: 80-90g/day Total Fluid Estimated Needs: 1.4-1.6L/day  Current Nutrition:  NPO  Plan:  Increase TPN to goal rate of 42mL/hr at 1800 Electrolytes in TPN: Na 83mEq/L, K 29mEq/L, Ca 72mEq/L, Mg 12mEq/L, and Phos 62mmol/L. Cl:Ac 1:1 Add standard MVI and trace elements to TPN Will also add trace elements x 3 days(3 of 3) Increase to Resistant q4h SSI and added 30 units of insulin to TPN Monitor TPN labs on Mon/Thurs, daily until stable  Manjot Beumer A Alzena Gerber 09/27/2022,2:47 PM

## 2022-09-27 NOTE — Assessment & Plan Note (Addendum)
Patient developed ileus most likely secondary to acute illness.  No surgery done. General surgery was consulted, serial KUB with stable bowel dilatation. Repeat CT abdomen with oral contrast with persistence of SBO with a transition point Bowel sounds hypoactive , started passing flatus.  Not a good surgical candidate Gastrografin studies with contrast in colon so make it partial obstruction.  She was started on clear liquid today. -Continue with TPN -Follow general surgery recommendations

## 2022-09-27 NOTE — Progress Notes (Signed)
PT Cancellation Note  Patient Details Name: Katherine Carter MRN: [id number] DOB: [birth year]   Cancelled Treatment:    Reason Eval/Treat Not Completed: Medical issues which prohibited therapy (Discussed with nurse. Patient now back to bed and on BiPAP. PT will hold and follow up as appropriate.)  Minna Merritts, PT, MPT  Percell Locus 09/27/2022, 9:24 AM

## 2022-09-28 ENCOUNTER — Inpatient Hospital Stay: Payer: Medicare Other

## 2022-09-28 DIAGNOSIS — K567 Ileus, unspecified: Secondary | ICD-10-CM | POA: Diagnosis not present

## 2022-09-28 DIAGNOSIS — A419 Sepsis, unspecified organism: Secondary | ICD-10-CM | POA: Diagnosis not present

## 2022-09-28 DIAGNOSIS — I723 Aneurysm of iliac artery: Secondary | ICD-10-CM | POA: Diagnosis not present

## 2022-09-28 DIAGNOSIS — E43 Unspecified severe protein-calorie malnutrition: Secondary | ICD-10-CM | POA: Diagnosis not present

## 2022-09-28 DIAGNOSIS — J189 Pneumonia, unspecified organism: Secondary | ICD-10-CM | POA: Diagnosis not present

## 2022-09-28 DIAGNOSIS — J9621 Acute and chronic respiratory failure with hypoxia: Secondary | ICD-10-CM | POA: Diagnosis not present

## 2022-09-28 LAB — GLUCOSE, CAPILLARY
Glucose-Capillary: 186 mg/dL — ABNORMAL HIGH (ref 70–99)
Glucose-Capillary: 193 mg/dL — ABNORMAL HIGH (ref 70–99)
Glucose-Capillary: 203 mg/dL — ABNORMAL HIGH (ref 70–99)
Glucose-Capillary: 203 mg/dL — ABNORMAL HIGH (ref 70–99)
Glucose-Capillary: 206 mg/dL — ABNORMAL HIGH (ref 70–99)
Glucose-Capillary: 220 mg/dL — ABNORMAL HIGH (ref 70–99)
Glucose-Capillary: 295 mg/dL — ABNORMAL HIGH (ref 70–99)

## 2022-09-28 LAB — COMPREHENSIVE METABOLIC PANEL
ALT: 43 U/L (ref 0–44)
AST: 26 U/L (ref 15–41)
Albumin: 2 g/dL — ABNORMAL LOW (ref 3.5–5.0)
Alkaline Phosphatase: 96 U/L (ref 38–126)
Anion gap: 8 (ref 5–15)
BUN: 29 mg/dL — ABNORMAL HIGH (ref 8–23)
CO2: 24 mmol/L (ref 22–32)
Calcium: 9.7 mg/dL (ref 8.9–10.3)
Chloride: 111 mmol/L (ref 98–111)
Creatinine, Ser: 0.63 mg/dL (ref 0.44–1.00)
GFR, Estimated: >60 mL/min (ref >60)
Glucose, Bld: 190 mg/dL — ABNORMAL HIGH (ref 70–99)
Potassium: 3.8 mmol/L (ref 3.5–5.1)
Sodium: 143 mmol/L (ref 135–145)
Total Bilirubin: 0.6 mg/dL (ref 0.3–1.2)
Total Protein: 7.5 g/dL (ref 6.5–8.1)

## 2022-09-28 LAB — CBC
HCT: 38 % (ref 36.0–46.0)
Hemoglobin: 11.8 g/dL — ABNORMAL LOW (ref 12.0–15.0)
MCH: 28 pg (ref 26.0–34.0)
MCHC: 31.1 g/dL (ref 30.0–36.0)
MCV: 90.3 fL (ref 80.0–100.0)
Platelets: 297 10*3/uL (ref 150–400)
RBC: 4.21 MIL/uL (ref 3.87–5.11)
RDW: 15.7 % — ABNORMAL HIGH (ref 11.5–15.5)
WBC: 11.9 10*3/uL — ABNORMAL HIGH (ref 4.0–10.5)
nRBC: 0 % (ref 0.0–0.2)

## 2022-09-28 LAB — PHOSPHORUS: Phosphorus: 2.7 mg/dL (ref 2.5–4.6)

## 2022-09-28 LAB — TRIGLYCERIDES: Triglycerides: 87 mg/dL (ref <150)

## 2022-09-28 LAB — MAGNESIUM: Magnesium: 1.8 mg/dL (ref 1.7–2.4)

## 2022-09-28 LAB — BRAIN NATRIURETIC PEPTIDE: B Natriuretic Peptide: 78.2 pg/mL (ref 0.0–100.0)

## 2022-09-28 MED ORDER — TRACE MINERALS CU-MN-SE-ZN 300-55-60-3000 MCG/ML IV SOLN
INTRAVENOUS | Status: AC
  Filled 2022-09-28: qty 576

## 2022-09-28 NOTE — Progress Notes (Signed)
[facility] SURGICAL PROGRESS NOTE (cpt [id number])  Hospital Day(s): 7.   Interval History: Patient seen and examined, no acute events or new complaints overnight. Patient reports she is feeling better this morning. Certainly remains distended but she did have a BM this morning. No fever, chills, nausea, emesis, or abdominal pain. Labs this morning are reassuring. CT Abdomen/Pelvis yesterday still concerning for dilated small bowel and KUB again this morning shows similar but there is air in colon.  Review of Systems:  Constitutional: denies fever, chills  HEENT: denies cough or congestion  Respiratory: denies any shortness of breath; + wheezing Cardiovascular: denies chest pain or palpitations  Gastrointestinal: + distension, denied abdominal pain, nausea, emesis Genitourinary: denies burning with urination or urinary frequency Musculoskeletal: denies pain, decreased motor or sensation  Vital signs in last 24 hours: [min-max] current  Temp:  [97.8 F (36.6 C)-98.5 F (36.9 C)] 97.8 F (36.6 C) (01/25 0700) Pulse Rate:  [90-129] 110 (01/25 0700) Resp:  [16-30] 20 (01/25 0700) BP: (110-187)/(64-139) 110/64 (01/25 0700) SpO2:  [91 %-98 %] 96 % (01/25 0700)     Height: 5\' 3"  (160 cm) Weight: 66.4 kg BMI (Calculated): 25.94   Intake/Output last 2 shifts:  01/24 0701 - 01/25 0700 In: 2184.6 [P.O.:300; I.V.:1784.6; IV Piggyback:100] Out: 2100 [Urine:2100]   Physical Exam:  Constitutional: alert, cooperative and no distress  HENT: normocephalic without obvious abnormality  Eyes: PERRL, EOM's grossly intact and symmetric  Respiratory: breathing non-labored at rest; expiratory wheezing heard in all fields Cardiovascular: tachycardic and sinus rhythm  Gastrointestinal: soft, non-tender, she remains distended, no rebound/guarding. Known umbilical hernia, soft  Musculoskeletal: no edema or wounds, motor and sensation grossly intact, NT    Labs:     Latest Ref Rng & Units  09/28/2022    2:48 AM 09/27/2022    5:23 AM 09/26/2022    3:48 AM  CBC  WBC 4.0 - 10.5 K/uL 11.9  12.2  11.8   Hemoglobin 12.0 - 15.0 g/dL 11.8  11.3  11.1   Hematocrit 36.0 - 46.0 % 38.0  37.1  36.2   Platelets 150 - 400 K/uL 297  324  317       Latest Ref Rng & Units 09/28/2022    2:48 AM 09/26/2022    4:52 AM 09/26/2022    3:48 AM  CMP  Glucose 70 - 99 mg/dL 190  248  570   BUN 8 - 23 mg/dL 29   42   Creatinine 0.44 - 1.00 mg/dL 0.63   0.90   Sodium 135 - 145 mmol/L 143   141   Potassium 3.5 - 5.1 mmol/L 3.8   5.0   Chloride 98 - 111 mmol/L 111   108   CO2 22 - 32 mmol/L 24   26   Calcium 8.9 - 10.3 mg/dL 9.7   10.1   Total Protein 6.5 - 8.1 g/dL 7.5   7.8   Total Bilirubin 0.3 - 1.2 mg/dL 0.6   0.5   Alkaline Phos 38 - 126 U/L 96   92   AST 15 - 41 U/L 26   36   ALT 0 - 44 U/L 43   50      Imaging studies: No new pertinent imaging studies   Assessment/Plan: (ICD-10's: K56.7) 72 y.o. female with likely ileus secondary to acute medical issues, SBO also possible, complicated by exacerbated COPD and PNA               -  Reasonable to continue to do sips of water, Boost breeze, ice chips. Would hold on formal diet order right now. ROBF is encouraging.              - Will hold off on NGT for now given lack of nausea/emesis. Should this return would recommend placement             - No role for surgical intervention at this time; She is a very sub-optimal surgical candidate especially given respiratory status.  - Continue TPN; at goal             - Monitor abdominal examination; on-going bowel function              - Pain control prn; limit narcotics - Antiemetics prn - Mobilize; engage therapies; needs to get OOB             - Further management per primary service; we will follow    All of the above findings and recommendations were discussed with the patient, and the medical team, and all of patient's questions were answered to her expressed satisfaction.   -- Edison Simon, PA-C [facility] 09/28/2022, 7:33 AM M-F: 7am - 4pm

## 2022-09-28 NOTE — Progress Notes (Signed)
Palliative Care Progress Note, Assessment & Plan   Patient Name: Katherine Carter       Date: 09/28/2022 DOB: [birth year]  Age: 72 y.o. MRN#: [id number] Attending Physician: Lorella Nimrod, MD Primary Care Physician: Donnie Coffin, MD Admit Date: 09/21/2022  Subjective: Patient is lying in bed in no apparent distress.  Respirations are even and unlabored.  She acknowledges my presence and is able to make her wishes known.  However, she cannot participate in complex medical decision making independently at this time.  No family or friends present at bedside.  HPI: 72 y.o. female  with past medical history of COPD (4L at baseline), type 2 diabetes, HTN, GERD, former smoker, and hysterectomy admitted on 09/21/2022 with cough, shortness of breath, poor p.o. intake, and weakness with falls at home.   Patient is being treated for severe sepsis with suspected multifocal pneumonia, AECOPD, and AKI with concern for mild DKA.   CT of abdomen has revealed SBO.  Surgery has been consulted and recommends conservative measures with bowel rest and serial abdominal x-rays.   PMT was consulted to discuss goals of care.  Summary of counseling/coordination of care: After reviewing the patient's chart and assessing the patient at bedside, I spoke with patient's husband Sheran Spine over the phone.  We discussed [name]'s diagnosis, prognosis, and goals of care.  I introduced palliative medicine as a specialized form of medical treatment for patients and families living with serious illness.  I reviewed that palliative medicine focuses on relief of symptoms and stress of the serious illnesses while also discussing goals to improve quality of life.  I attempted to elicit goals and values important to the patient and husband.   He shares he would like to have her diabetes better controlled and to help with her COPD as much as medically possible.  Discussed that COPD is a chronic, progressive, and irreversible disease.  I highlighted that treatments focus on optimizing current respiratory status without goal of cure. QOL discussed. Patient's husband shares he knows that she will never get rid of her COPD but wants to do what is within reason to help her feel better with it.  Discussed patient's functional, nutritional, and cognitive status PTA.  Husband shares that patient was completely independent with all ADLs PTA.  He denies any confusion or cognitive deficits.    We discussed patient's current functional, nutritional, and cognitive status.  I conveyed that patient is encephalopathic and is becoming more debilitated with every day of hospitalization.  Reviewed SBO versus ileus with serial abdominal x-rays as having revealed no improvement to date.  He is hopeful that her small bowel obstruction has resolved.  We reviewed the concepts of treating the treatable and watchful waiting.  I outlined that if patient's small bowel obstruction does not resolve then surgical team would discuss next steps/options with pt/Barney. He is resolved to continue supporting patient with current plan of care in hopes of her SOB resolving. No changes to plan of care at this time. DNR/DNI remains.   Therapeutic silence and active listening provided for Sheran Spine to share his thoughts and emotions regarding current medical situation.  Emotional support provided.  PMT will continue to follow and support  patient/family throughout her hospitalization.   Physical Exam Vitals reviewed.  Constitutional:      General: She is not in acute distress.    Appearance: She is normal weight. She is not ill-appearing.  HENT:     Mouth/Throat:     Mouth: Mucous membranes are moist.  Eyes:     Pupils: Pupils are equal, round, and reactive to light.   Cardiovascular:     Rate and Rhythm: Tachycardia present.  Pulmonary:     Effort: Pulmonary effort is normal.     Comments:  in place Abdominal:     General: There is distension.  Musculoskeletal:     Comments: Generalized weakness  Neurological:     Mental Status: She is alert.  Psychiatric:        Behavior: Behavior normal.             Total Time 50 minutes   Clee Pandit L. Ilsa Iha, FNP-BC Palliative Medicine Team Team Phone # [phone]

## 2022-09-28 NOTE — Progress Notes (Signed)
Physical Therapy Treatment Patient Details Name: Katherine Carter MRN: [id number] DOB: 02-12-1951 Today's Date: 09/28/2022   History of Present Illness Pt admitted for Acute on chronic respiratory failure with hypoxia along with sepsis. Pt with complaints of weakness, falls, and cough. HIstory includes COPD on 4L of O2 at baseline, DM, HTN, and GERD.    PT Comments    Patient agreeable to PT. She has limited activity tolerance today and reports generalized weakness and intermittent nausea with activity. She was able to stand x 2 bouts but limited standing tolerance for attempting to walk. Recommend to continue PT to maximize independence. Consider SNF placement if activity tolerance and independence with activity does not improve.    Recommendations for follow up therapy are one component of a multi-disciplinary discharge planning process, led by the attending physician.  Recommendations may be updated based on patient status, additional functional criteria and insurance authorization.  Follow Up Recommendations  Skilled nursing-short term rehab (<3 hours/day) Can patient physically be transported by private vehicle: Yes   Assistance Recommended at Discharge Intermittent Supervision/Assistance  Patient can return home with the following A little help with walking and/or transfers;A little help with bathing/dressing/bathroom;Assist for transportation;Help with stairs or ramp for entrance   Equipment Recommendations  Rolling walker (2 wheels)    Recommendations for Other Services       Precautions / Restrictions Precautions Precautions: Fall Restrictions Weight Bearing Restrictions: No     Mobility  Bed Mobility Overal bed mobility: Needs Assistance Bed Mobility: Supine to Sit, Sit to Supine     Supine to sit: Min assist Sit to supine: Min assist   General bed mobility comments: assistance for LE support required. verbal cues for technique    Transfers Overall  transfer level: Needs assistance Equipment used: Rolling walker (2 wheels) Transfers: Sit to/from Stand Sit to Stand: Min assist           General transfer comment: lifting assistance required for standing. 2 bouts of standing performed patient patient reports nausea and feeling generalized weakness with activity    Ambulation/Gait               General Gait Details: unable to progress to ambulation due to poor standing tolerance   Stairs             Wheelchair Mobility    Modified Rankin (Stroke Patients Only)       Balance Overall balance assessment: Needs assistance Sitting-balance support: Feet supported Sitting balance-Leahy Scale: Fair     Standing balance support: Bilateral upper extremity supported Standing balance-Leahy Scale: Fair Standing balance comment: limited standing tolerance                            Cognition Arousal/Alertness: Awake/alert Behavior During Therapy: WFL for tasks assessed/performed Overall Cognitive Status: No family/caregiver present to determine baseline cognitive functioning                                 General Comments: slow processing at times        Exercises      General Comments General comments (skin integrity, edema, etc.): heart rate 120-129 with activity      Pertinent Vitals/Pain Pain Assessment Pain Assessment: No/denies pain    Home Living  Prior Function            PT Goals (current goals can now be found in the care plan section) Acute Rehab PT Goals Patient Stated Goal: to get her strength back PT Goal Formulation: With patient Time For Goal Achievement: 10/07/22 Potential to Achieve Goals: Good Progress towards PT goals: Progressing toward goals    Frequency    Min 2X/week      PT Plan Discharge plan needs to be updated    Co-evaluation              AM-PAC PT "6 Clicks" Mobility   Outcome Measure   Help needed turning from your back to your side while in a flat bed without using bedrails?: A Little Help needed moving from lying on your back to sitting on the side of a flat bed without using bedrails?: A Little Help needed moving to and from a bed to a chair (including a wheelchair)?: A Little Help needed standing up from a chair using your arms (e.g., wheelchair or bedside chair)?: A Little Help needed to walk in hospital room?: A Lot Help needed climbing 3-5 steps with a railing? : A Lot 6 Click Score: 16    End of Session Equipment Utilized During Treatment: Oxygen Activity Tolerance: Patient limited by fatigue Patient left: in bed;with call bell/phone within reach Nurse Communication: Mobility status PT Visit Diagnosis: Unsteadiness on feet (R26.81);Muscle weakness (generalized) (M62.81);History of falling (Z91.81);Difficulty in walking, not elsewhere classified (R26.2)     Time: 1660-6301 PT Time Calculation (min) (ACUTE ONLY): 16 min  Charges:  $Therapeutic Activity: 8-22 mins                    Minna Merritts, PT, MPT   Percell Locus 09/28/2022, 2:09 PM

## 2022-09-28 NOTE — Progress Notes (Signed)
Occupational Therapy Treatment Patient Details Name: Katherine Carter MRN: [id number] DOB: [birth year] Today's Date: 09/28/2022   History of present illness Pt admitted for Acute on chronic respiratory failure with hypoxia along with sepsis. Pt with complaints of weakness, falls, and cough. HIstory includes COPD on 4L of O2 at baseline, DM, HTN, and GERD.   OT comments  Katherine Carter was seen for OT treatment on this date. Upon arrival to room pt reclined in bed, agreeable to tx. Pt requires CGA sup<>sit and dynamic sitting balance. MIN A + HHA sit<>stand x2, poor tolerance. Slow processing at times, eyes closed intermittently t/o session, difficulty following commands for therex. Completed therex as described below. Pt making good progress toward goals, will continue to follow POC. Discharge recommendation updated to reflect decreased tolerance. May dc home with 24/7 assistance for all mobility.     Recommendations for follow up therapy are one component of a multi-disciplinary discharge planning process, led by the attending physician.  Recommendations may be updated based on patient status, additional functional criteria and insurance authorization.    Follow Up Recommendations  Skilled nursing-short term rehab (<3 hours/day)     Assistance Recommended at Discharge Frequent or constant Supervision/Assistance  Patient can return home with the following  Assistance with cooking/housework;Assist for transportation;Help with stairs or ramp for entrance;A lot of help with walking and/or transfers;A lot of help with bathing/dressing/bathroom   Equipment Recommendations  BSC/3in1    Recommendations for Other Services      Precautions / Restrictions Precautions Precautions: Fall Restrictions Weight Bearing Restrictions: No       Mobility Bed Mobility Overal bed mobility: Needs Assistance Bed Mobility: Supine to Sit, Sit to Supine     Supine to sit: Min guard Sit to supine: Min  guard   General bed mobility comments: VCs to sequence    Transfers Overall transfer level: Needs assistance Equipment used: 1 person hand held assist Transfers: Sit to/from Stand Sit to Stand: Min assist           General transfer comment: x2 stands, poor tolerance, feeling generalized weakness with activity     Balance Overall balance assessment: Needs assistance Sitting-balance support: Feet supported Sitting balance-Leahy Scale: Fair     Standing balance support: Single extremity supported, During functional activity Standing balance-Leahy Scale: Fair                             ADL either performed or assessed with clinical judgement   ADL Overall ADL's : Needs assistance/impaired                                       General ADL Comments: CGA seated self-drinking      Cognition Arousal/Alertness: Awake/alert Behavior During Therapy: WFL for tasks assessed/performed Overall Cognitive Status: No family/caregiver present to determine baseline cognitive functioning                                 General Comments: slow processing at times, eyes closed intermittently t/o session, difficulty following commands for therex        Exercises Exercises: Other exercises Other Exercises Other Exercises: Seated therex 1 set x 15 reps: LAQ, overhead press, marching (difficulty sequencing), flutter valve, IS       General Comments heart rate 120-129  with activity    Pertinent Vitals/ Pain       Pain Assessment Pain Assessment: No/denies pain   Frequency  Min 2X/week        Progress Toward Goals  OT Goals(current goals can now be found in the care plan section)  Progress towards OT goals: Progressing toward goals  Acute Rehab OT Goals Patient Stated Goal: get stronger OT Goal Formulation: With patient Time For Goal Achievement: 10/07/22 Potential to Achieve Goals: Good ADL Goals Pt Will Perform Grooming: with  modified independence;standing Pt Will Perform Lower Body Dressing: with modified independence;sit to/from stand Pt Will Transfer to Toilet: with modified independence;ambulating Pt Will Perform Toileting - Clothing Manipulation and hygiene: with modified independence;sit to/from stand Additional ADL Goal #1: Pt will provide teach back of 2-3 Energy conservation techniques for improved activity tolerance in ADL/IADL tasks  Plan Discharge plan needs to be updated;Frequency needs to be updated    Co-evaluation                 AM-PAC OT "6 Clicks" Daily Activity     Outcome Measure   Help from another person eating meals?: None Help from another person taking care of personal grooming?: A Little Help from another person toileting, which includes using toliet, bedpan, or urinal?: A Lot Help from another person bathing (including washing, rinsing, drying)?: A Lot Help from another person to put on and taking off regular upper body clothing?: A Little Help from another person to put on and taking off regular lower body clothing?: A Lot 6 Click Score: 16    End of Session    OT Visit Diagnosis: Unsteadiness on feet (R26.81);Muscle weakness (generalized) (M62.81);History of falling (Z91.81)   Activity Tolerance Patient tolerated treatment well   Patient Left in bed;with call bell/phone within reach   Nurse Communication          Time: 1610-9604 OT Time Calculation (min): 14 min  Charges: OT General Charges $OT Visit: 1 Visit OT Treatments $Therapeutic Exercise: 8-22 mins  Dessie Coma, M.S. OTR/L  09/28/22, 2:46 PM  ascom [phone]

## 2022-09-28 NOTE — Consult Note (Signed)
PHARMACY - TOTAL PARENTERAL NUTRITION CONSULT NOTE   Indication: Small bowel obstruction  Patient Measurements: Height: 5\' 3"  (160 cm) Weight: 66.4 kg (146 lb 6.2 oz) IBW/kg (Calculated) : 52.4 TPN AdjBW (KG): 56.7 Body mass index is 25.93 kg/m. Usual Weight: 70-72 kg  Assessment:  72 y.o. female history of COPD admitted for acute on chronic respiratory failure and sepsis likely from multifocal pneumonia.  Was found to have acute kidney injury.  She was transferred to the ICU last couple of days and yesterday started having some abdominal discomfort and this morning having more abdominal pain.  The pain was diffuse intermittent moderate intensity.  There was a CT scan that was obtained that have personally reviewed showing evidence of dilated loops of bowel but no evidence of pneumatosis or closed-loop obstruction. NG tube could not be placed.  She now feels better.  She continues to have respiratory issues.  She denies any abdominal pain.  She did have a bowel movement and is passing flatus. Pharmacy has been consulted to initiate TPN as patient has not eaten in~5 days  Glucose / Insulin: BG 141-248, insulin last 24h: 34 units Electrolytes: currently WNL Renal: SCR<1, stable Hepatic: AST/ALT WNL Intake / Output; MIVF: net (-1.1L), no IVF currently ordered GI Imaging: 1/21 CT of Abdomen: Examination is positive for small bowel obstruction with transition to diminished caliber distal small bowel within the right lower quadrant of the abdomen at the level of distal ileum. GI Surgeries / Procedures:  No current surgical intervention planned  Central access: Patient with PICC already placed prior to admission TPN start date: 09/25/22  Nutritional Goals: Goal TPN rate is 60 mL/hr (provides 86.4 g of protein and 1685 kcals per day)  RD Assessment: Kcal goal: 1600-1800 kcals/day Protein: 80-90 g/day Fluids: 1.4-1.6 L/day Estimated Needs Total Energy Estimated Needs:  1600-1800kcal/day Total Protein Estimated Needs: 80-90g/day Total Fluid Estimated Needs: 1.4-1.6L/day  Current Nutrition:  NPO  Plan:  Increase TPN to goal rate of 35mL/hr at 1800. Will also initiate CLD + nutritional supplementation Electrolytes in TPN: Na 49mEq/L, K 24mEq/L, Ca 71mEq/L, Mg 51mEq/L, and Phos 99mmol/L. Cl:Ac 1:1 Add standard MVI and trace elements to TPN Will also add trace elements x 3 days(3 of 3) Continue Resistant q4h SSI and will increase to 40 units of insulin to TPN Monitor TPN labs on Mon/Thurs, daily until stable  Pearla Dubonnet, PharmD Clinical Pharmacist 09/28/2022 11:34 AM

## 2022-09-28 NOTE — Progress Notes (Signed)
Progress Note   Patient: Katherine Carter ZOX:[id number] DOB: [birth year] DOA: 09/21/2022     7 DOS: the patient was seen and examined on 09/28/2022   Brief hospital course: ICU transfer on 09/26/2022.  Taken from prior notes.  Katherine Carter is a 72 year old female with a past medical history significant for chronic hypoxic respiratory failure requiring 4 L supplemental oxygen due to COPD, diabetes mellitus, hypertension, GERD who presents to [facility] ED on 09/21/2022 due to generalized weakness status post multiple falls, dyspnea, and cough.  Initially admitted in ICU for concern of acute on chronic hypoxic respiratory failure and severe sepsis secondary to multifocal pneumonia, initially requiring BiPAP.  Also found to have AKI and concern of mild DKA.  Patient also had anion gap metabolic acidosis along with hyper kalemia and mild hyponatremia on admission.  Chest x-ray with new patchy right greater than left perihilar opacities and pulmonary vascular congestion.  CT head was without any acute abnormality.  CT cervical spine was without any acute fracture, did show some chronic changes with multilevel spinal canal stenosis and a disc protrusion at C3-C4 level.  CT of chest was negative for PE but did show dense consolidation in the right lung likely representing pneumonia along with emphysematous changes in the lungs.  Respiratory panel was negative.  Blood cultures remain negative.  Urine cultures with less than 10,000 colonies/insignificant growth.  Respiratory viral panel negative.  Strep pneumo and Legionella urinary antigens negative.  MRSA PCR negative.  Patient initially received cefepime and vancomycin and Zithromax, vancomycin was discontinued and she continued with Zithromax and cefepime since 09/21/2022.  Patient remained on BiPAP until 09/25/2022 and able to wean off to high flow nasal cannula. On 09/24/2022 she developed projectile vomiting and abdominal distention, CT abdomen  and pelvis revealed small bowel obstruction and general surgery was consulted.  KUB on 1/22 continued to show SBO but she started having bowel movement and nausea, vomiting and abdominal pain resolved.  CT abdomen with also concern of a 2.5 cm iliac aneurysm with distal thrombosis.  Vascular surgery was consulted as on exam she has [facility] Doppler pulses on the right lower extremity. Per vascular surgery note due to current respiratory status and no immediate danger to limb for critical ischemia, they are recommending outpatient follow-up with a possible right lower extremity angiogram and PCI.  Patient was also started on TPN for nutritional support on 09/25/2022.  Today patient remained  tachycardic and tachypneic, requiring up to 6 L of oxygen with high flow.  CMP this morning shows elevated blood glucose at 570, which has been improved to 205 with intervention.  CBC with improving leukocytosis at 11.8, rest of the labs unremarkable.  Follow-up chest x-ray today with stable right perihilar and upper lobe opacity. KUB with stable small bowel dilatation.  No bowel movement since this morning. Remained n.p.o. and on TPN. Starting her on metoprolol 5 mg every 8 hourly and LR at 75 mL/h as she clinically appears dry. Another message sent to general surgery for their follow-up recommendations.  1/24: Remained mildly tachycardic and tachypneic.  Repeat chest x-ray with right upper lung consolidation and KUB with persistent SBO/ileus.  No free air identified.  Saturating well on baseline oxygen of 4 L.  General surgery did a contrast CT abdomen which shows small bowel obstruction with transition point in the distal ileum in the right lower quadrant, unchanged from prior.  Stable right common iliac artery aneurysm.  Still no flatus or BM. Having on and  off BiPAP to help with breathing.  Chest x-ray with few basal crackles, stopping IV fluid.  Checking BNP with morning labs Goals of care discussion still  pending-palliative care is on board.  1/25: Patient remained mildly tachycardic and tachypneic.  Started passing flatus.  KUB with persistent ileitis/partial SBO.  Not a good surgical candidate, so they will continue conservative management and TPN.  Saturating well on baseline oxygen requirement of 4 L. Will complete the course of antibiotic today.  Patient is high risk for deterioration and mortality.   Assessment and Plan: * Acute on chronic respiratory failure with hypoxia (HCC) Most likely secondary to pneumonia.  Initially requiring BiPAP, currently on 5 to 6 L of oxygen with high flow.  Baseline oxygen use of 4 L.  Continued to require intermittent BiPAP to help with breathing.  Still tachycardic and tachypneic -Continue with bronchodilators -Continue with supportive care -Continue with supplemental oxygen-wean to baseline as tolerated  Sepsis due to pneumonia [facility]) Patient met sepsis criteria with leukocytosis, tachycardia and tachypnea on admission.  Most likely secondary to multifocal pneumonia. Strep pneumo and Legionella negative.  Cultures remain negative. Patient was treated with cefepime and Zithromax.  Completed the course of Zithromax. -Continue with cefepime -will complete the course today  [facility] Patient developed ileus most likely secondary to acute illness.  No surgery done. General surgery was consulted, serial KUB with stable bowel dilatation. Repeat CT abdomen with oral contrast with persistence of SBO with a transition point Bowel sounds hypoactive , started passing flatus.  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  Decreased right lower extremity pulses but patient is not with critical limb  ischemia so they are recommending outpatient follow-up and a possible right lower extremity angiography and PCI. -Restart home aspirin -Continue Lipitor  Uncontrolled type 2 diabetes mellitus with hyperglycemia, without long-term current use of insulin (HCC) -Continue Levemir at 21 units daily -Continue with SSI  Protein-calorie malnutrition, severe Estimated body mass index is 25.93 kg/m as calculated from the following:   Height as of this encounter: 5\' 3"  (1.6 m).   Weight as of this encounter: 66.4 kg.   -Patient is currently on TPN for nutritional support. -Continue TPN-we will start p.o. per surgery recommendations  HTN (hypertension) Blood pressure elevated.  Home medications are currently on hold due to n.p.o. status. -Adding metoprolol 5 mg every 8 hourly. -As needed labetalol and hydralazine -Continue amlodipine -We will add back losartan if needed      Subjective: Patient was seen and examined today.  Stating that she is feeling little improved today.  Passing flatus and had a very small bowel movement.  Physical Exam: Vitals:   09/28/22 1100 09/28/22 1200 09/28/22 1300 09/28/22 1317  BP: (!) 149/81 (!) 146/82 (!) 134/95   Pulse: (!) 121 (!) 122 (!) 125   Resp: (!) 24 (!) 27 (!) 28   Temp:      TempSrc:      SpO2: 94% 93% 95% 96%  Weight:      Height:       General.  Frail elderly lady, in no acute distress. Pulmonary.  Lungs clear bilaterally, normal respiratory effort. CV.  Mild sinus tachycardia Abdomen.  Soft, nontender, nondistended, BS positive. CNS.  Alert and oriented .  No focal neurologic deficit. Extremities.  No edema, no cyanosis, pulses intact and symmetrical. Psychiatry.  Appears to have some cognitive impairment.  Data Reviewed: Prior data reviewed  Family Communication: Will  Disposition: Status is: Inpatient Remains inpatient appropriate because: Severity of illness  Planned Discharge Destination: Home with Home Health  DVT  prophylaxis.  Lovenox Time spent: 45 minutes  This record has been created using Systems analyst. Errors have been sought and corrected,but may not always be located. Such creation errors do not reflect on the standard of care.   Author: Lorella Nimrod, MD 09/28/2022 1:43 PM  For on call review [email or web address].

## 2022-09-29 ENCOUNTER — Inpatient Hospital Stay: Payer: Medicare Other

## 2022-09-29 DIAGNOSIS — A419 Sepsis, unspecified organism: Secondary | ICD-10-CM | POA: Diagnosis not present

## 2022-09-29 DIAGNOSIS — J189 Pneumonia, unspecified organism: Secondary | ICD-10-CM | POA: Diagnosis not present

## 2022-09-29 DIAGNOSIS — J9621 Acute and chronic respiratory failure with hypoxia: Secondary | ICD-10-CM | POA: Diagnosis not present

## 2022-09-29 DIAGNOSIS — K567 Ileus, unspecified: Secondary | ICD-10-CM | POA: Diagnosis not present

## 2022-09-29 LAB — GLUCOSE, CAPILLARY
Glucose-Capillary: 188 mg/dL — ABNORMAL HIGH (ref 70–99)
Glucose-Capillary: 198 mg/dL — ABNORMAL HIGH (ref 70–99)
Glucose-Capillary: 199 mg/dL — ABNORMAL HIGH (ref 70–99)
Glucose-Capillary: 236 mg/dL — ABNORMAL HIGH (ref 70–99)
Glucose-Capillary: 254 mg/dL — ABNORMAL HIGH (ref 70–99)

## 2022-09-29 LAB — CBC
HCT: 33.4 % — ABNORMAL LOW (ref 36.0–46.0)
Hemoglobin: 10.2 g/dL — ABNORMAL LOW (ref 12.0–15.0)
MCH: 28.1 pg (ref 26.0–34.0)
MCHC: 30.5 g/dL (ref 30.0–36.0)
MCV: 92 fL (ref 80.0–100.0)
Platelets: 277 10*3/uL (ref 150–400)
RBC: 3.63 MIL/uL — ABNORMAL LOW (ref 3.87–5.11)
RDW: 15.7 % — ABNORMAL HIGH (ref 11.5–15.5)
WBC: 13.9 10*3/uL — ABNORMAL HIGH (ref 4.0–10.5)
nRBC: 0 % (ref 0.0–0.2)

## 2022-09-29 MED ORDER — TRACE MINERALS CU-MN-SE-ZN 300-55-60-3000 MCG/ML IV SOLN
INTRAVENOUS | Status: AC
  Filled 2022-09-29: qty 576

## 2022-09-29 NOTE — Consult Note (Signed)
PHARMACY - TOTAL PARENTERAL NUTRITION CONSULT NOTE   Indication: Small bowel obstruction  Patient Measurements: Height: 5\' 3"  (160 cm) Weight: 66.4 kg (146 lb 6.2 oz) IBW/kg (Calculated) : 52.4 TPN AdjBW (KG): 56.7 Body mass index is 25.93 kg/m. Usual Weight: 70-72 kg  Assessment:  72 y.o. female history of COPD admitted for acute on chronic respiratory failure and sepsis likely from multifocal pneumonia.  Was found to have acute kidney injury.  She was transferred to the ICU last couple of days and yesterday started having some abdominal discomfort and this morning having more abdominal pain.  The pain was diffuse intermittent moderate intensity.  There was a CT scan that was obtained that have personally reviewed showing evidence of dilated loops of bowel but no evidence of pneumatosis or closed-loop obstruction. NG tube could not be placed.  She now feels better.  She continues to have respiratory issues.  She denies any abdominal pain.  She did have a bowel movement and is passing flatus. Pharmacy has been consulted to initiate TPN as patient has not eaten in~5 days  Glucose / Insulin: BG 141-248, insulin last 24h: 40 units Electrolytes: currently WNL Renal: SCR<1, stable Hepatic: AST/ALT WNL Intake / Output; MIVF: net (-1.3L), no IVF currently ordered GI Imaging: 1/21 CT of Abdomen: Examination is positive for small bowel obstruction with transition to diminished caliber distal small bowel within the right lower quadrant of the abdomen at the level of distal ileum. GI Surgeries / Procedures:  No current surgical intervention planned  Central access: Patient with PICC already placed prior to admission TPN start date: 09/25/22  Nutritional Goals: Goal TPN rate is 60 mL/hr (provides 86.4 g of protein and 1685 kcals per day)  RD Assessment: Kcal goal: 1600-1800 kcals/day Protein: 80-90 g/day Fluids: 1.4-1.6 L/day Estimated Needs Total Energy Estimated Needs:  1600-1800kcal/day Total Protein Estimated Needs: 80-90g/day Total Fluid Estimated Needs: 1.4-1.6L/day  Current Nutrition:  NPO  Plan:  Increase TPN to goal rate of 69mL/hr at 1800. Will also initiate CLD + nutritional supplementation Electrolytes in TPN: Na 69mEq/L, K 46mEq/L, Ca 53mEq/L, Mg 54mEq/L, and Phos 61mmol/L. Cl:Ac 1:1 Add standard MVI and trace elements to TPN Will also add trace elements x 3 days(3 of 3) Continue Resistant q4h SSI and will increase to 50 units of insulin to TPN Monitor TPN labs on Mon/Thurs, daily until stable  Pearla Dubonnet, PharmD Clinical Pharmacist 09/29/2022 3:14 PM

## 2022-09-29 NOTE — Progress Notes (Signed)
CROSS COVER NOTE  NAME: Katherine Carter MRN: [id number] DOB : 12-20-1950    HPI/Events of Note   Request to reeval level of care need of patient.  Assessment and  Interventions   Assessment: Patient now at baseline oxygen requirements without resp distress.  Ileus resolving, tolerating clear liquids Code status now DNR  Plan: Transfer patient to med tele - preferable Brandon NP [facility]

## 2022-09-29 NOTE — Progress Notes (Signed)
   09/29/22 2000  Spiritual Framework  Values/beliefs  ([place])  Community/Connection Family;Significant other;Spiritual leader  Scientist, clinical (histocompatibility and immunogenetics) and family support  Patient Stress Factors None identified  Family Stress Factors None identified  Interventions  Spiritual Care Interventions Made Established relationship of care and support;Compassionate presence;Reflective listening  Intervention Outcomes  Outcomes Connection to spiritual care;Connected to [place] will continue to follow   Spoke with patient and patient spouse about their 48 years of marriage. Both patient and spouse are firm believers in [place] and have a strong faith belief. Advise them that Pastoral care is around and able 24/7 just get in contact with the medical staff.

## 2022-09-29 NOTE — Progress Notes (Signed)
[facility] SURGICAL PROGRESS NOTE (cpt [phone])  Hospital Day(s): 8.   Interval History: Patient seen and examined, no acute events or new complaints overnight. Patient reports she is doing well; receiving breathing treatment. Still distended. No fever, chills, nausea, emesis, or abdominal pain. Labs this morning are reassuring aside from fluctuating WBC. Continues to endorse flatus and BM.  Review of Systems:  Constitutional: denies fever, chills  HEENT: denies cough or congestion  Respiratory: denies any shortness of breath; + wheezing Cardiovascular: denies chest pain or palpitations  Gastrointestinal: + distension, denied abdominal pain, nausea, emesis Genitourinary: denies burning with urination or urinary frequency Musculoskeletal: denies pain, decreased motor or sensation  Vital signs in last 24 hours: [min-max] current  Temp:  [97.6 F (36.4 C)-98.2 F (36.8 C)] 98.2 F (36.8 C) (01/26 0359) Pulse Rate:  [104-125] 110 (01/26 0359) Resp:  [14-28] 19 (01/26 0359) BP: (115-149)/(65-95) 115/81 (01/26 0359) SpO2:  [93 %-98 %] 95 % (01/26 0359)     Height: 5\' 3"  (160 cm) Weight: 66.4 kg BMI (Calculated): 25.94   Intake/Output last 2 shifts:  01/25 0701 - 01/26 0700 In: -  Out: 1000 [Urine:1000]   Physical Exam:  Constitutional: alert, cooperative and no distress  HENT: normocephalic without obvious abnormality  Eyes: PERRL, EOM's grossly intact and symmetric  Respiratory: breathing non-labored at rest; undergoing breathing treatment this AM Cardiovascular: tachycardic and sinus rhythm  Gastrointestinal: soft, non-tender, she remains distended, no rebound/guarding. Known umbilical hernia, soft  Musculoskeletal: no edema or wounds, motor and sensation grossly intact, NT    Labs:     Latest Ref Rng & Units 09/29/2022    5:30 AM 09/28/2022    2:48 AM 09/27/2022    5:23 AM  CBC  WBC 4.0 - 10.5 K/uL 13.9  11.9  12.2   Hemoglobin 12.0 - 15.0 g/dL 10.2  11.8   11.3   Hematocrit 36.0 - 46.0 % 33.4  38.0  37.1   Platelets 150 - 400 K/uL 277  297  324       Latest Ref Rng & Units 09/28/2022    2:48 AM 09/26/2022    4:52 AM 09/26/2022    3:48 AM  CMP  Glucose 70 - 99 mg/dL 190  248  570   BUN 8 - 23 mg/dL 29   42   Creatinine 0.44 - 1.00 mg/dL 0.63   0.90   Sodium 135 - 145 mmol/L 143   141   Potassium 3.5 - 5.1 mmol/L 3.8   5.0   Chloride 98 - 111 mmol/L 111   108   CO2 22 - 32 mmol/L 24   26   Calcium 8.9 - 10.3 mg/dL 9.7   10.1   Total Protein 6.5 - 8.1 g/dL 7.5   7.8   Total Bilirubin 0.3 - 1.2 mg/dL 0.6   0.5   Alkaline Phos 38 - 126 U/L 96   92   AST 15 - 41 U/L 26   36   ALT 0 - 44 U/L 43   50      Imaging studies: No new pertinent imaging studies   Assessment/Plan: (ICD-10's: K66.7) 72 y.o. female with likely ileus secondary to acute medical issues, SBO also possible, complicated by exacerbated COPD and PNA               - Would continue CLD for now + nutritional supplementation. Cautiously advance.              -  Will hold off on NGT for now given lack of nausea/emesis. Should this return would recommend placement             - No role for surgical intervention at this time; She is a very sub-optimal surgical candidate especially given respiratory status. - Will get morning KUB  - Continue TPN; at goal             - Monitor abdominal examination; on-going bowel function              - Pain control prn; limit narcotics - Antiemetics prn - Mobilize; engage therapies; needs to get OOB             - Further management per primary service; we will follow    All of the above findings and recommendations were discussed with the patient, and the medical team, and all of patient's questions were answered to her expressed satisfaction.  -- Edison Simon, PA-C [facility] 09/29/2022, 7:26 AM M-F: 7am - 4pm

## 2022-09-29 NOTE — Progress Notes (Signed)
Progress Note   Patient: Katherine Carter HEN:[id number] DOB: 1951/06/11 DOA: 09/21/2022     8 DOS: the patient was seen and examined on 09/29/2022   Brief hospital course: ICU transfer on 09/26/2022.  Taken from prior notes.  Katherine Carter is a 72 year old female with a past medical history significant for chronic hypoxic respiratory failure requiring 4 L supplemental oxygen due to COPD, diabetes mellitus, hypertension, GERD who presents to [facility] ED on 09/21/2022 due to generalized weakness status post multiple falls, dyspnea, and cough.  Initially admitted in ICU for concern of acute on chronic hypoxic respiratory failure and severe sepsis secondary to multifocal pneumonia, initially requiring BiPAP.  Also found to have AKI and concern of mild DKA.  Patient also had anion gap metabolic acidosis along with hyper kalemia and mild hyponatremia on admission.  Chest x-ray with new patchy right greater than left perihilar opacities and pulmonary vascular congestion.  CT head was without any acute abnormality.  CT cervical spine was without any acute fracture, did show some chronic changes with multilevel spinal canal stenosis and a disc protrusion at C3-C4 level.  CT of chest was negative for PE but did show dense consolidation in the right lung likely representing pneumonia along with emphysematous changes in the lungs.  Respiratory panel was negative.  Blood cultures remain negative.  Urine cultures with less than 10,000 colonies/insignificant growth.  Respiratory viral panel negative.  Strep pneumo and Legionella urinary antigens negative.  MRSA PCR negative.  Patient initially received cefepime and vancomycin and Zithromax, vancomycin was discontinued and she continued with Zithromax and cefepime since 09/21/2022.  Patient remained on BiPAP until 09/25/2022 and able to wean off to high flow nasal cannula. On 09/24/2022 she developed projectile vomiting and abdominal distention, CT abdomen  and pelvis revealed small bowel obstruction and general surgery was consulted.  KUB on 1/22 continued to show SBO but she started having bowel movement and nausea, vomiting and abdominal pain resolved.  CT abdomen with also concern of a 2.5 cm iliac aneurysm with distal thrombosis.  Vascular surgery was consulted as on exam she has V-Care Doppler pulses on the right lower extremity. Per vascular surgery note due to current respiratory status and no immediate danger to limb for critical ischemia, they are recommending outpatient follow-up with a possible right lower extremity angiogram and PCI.  Patient was also started on TPN for nutritional support on 09/25/2022.  Today patient remained  tachycardic and tachypneic, requiring up to 6 L of oxygen with high flow.  CMP this morning shows elevated blood glucose at 570, which has been improved to 205 with intervention.  CBC with improving leukocytosis at 11.8, rest of the labs unremarkable.  Follow-up chest x-ray today with stable right perihilar and upper lobe opacity. KUB with stable small bowel dilatation.  No bowel movement since this morning. Remained n.p.o. and on TPN. Starting her on metoprolol 5 mg every 8 hourly and LR at 75 mL/h as she clinically appears dry. Another message sent to general surgery for their follow-up recommendations.  1/24: Remained mildly tachycardic and tachypneic.  Repeat chest x-ray with right upper lung consolidation and KUB with persistent SBO/ileus.  No free air identified.  Saturating well on baseline oxygen of 4 L.  General surgery did a contrast CT abdomen which shows small bowel obstruction with transition point in the distal ileum in the right lower quadrant, unchanged from prior.  Stable right common iliac artery aneurysm.  Still no flatus or BM. Having on and  off BiPAP to help with breathing.  Chest x-ray with few basal crackles, stopping IV fluid.  Checking BNP with morning labs Goals of care discussion still  pending-palliative care is on board.  1/25: Patient remained mildly tachycardic and tachypneic.  Started passing flatus.  KUB with persistent ileitis/partial SBO.  Not a good surgical candidate, so they will continue conservative management and TPN.  Saturating well on baseline oxygen requirement of 4 L. Will complete the course of antibiotic today.  Patient is high risk for deterioration and mortality.   Assessment and Plan: * Acute on chronic respiratory failure with hypoxia (HCC) Improved.  Currently saturating at baseline oxygen requirement of 4 L Who most likely secondary to pneumonia.  Initially requiring BiPAP,  Still tachycardic and tachypneic although improving than before -Continue with bronchodilators -Continue with supportive care -Continue with supplemental oxygen-wean to baseline as tolerated  Sepsis due to pneumonia [facility]) Patient met sepsis criteria with leukocytosis, tachycardia and tachypnea on admission.  Most likely secondary to multifocal pneumonia. Strep pneumo and Legionella negative.  Cultures remain negative. Patient was treated with cefepime and Zithromax.  Completed the course of Zithromax. -Continue with cefepime -will complete the course today  Ileus [facility]) Patient developed ileus most likely secondary to acute illness.  No surgery done. General surgery was consulted, serial KUB with stable bowel dilatation. Repeat CT abdomen with oral contrast with persistence of SBO with a transition point Bowel sounds hypoactive , started passing flatus.  Not a good surgical candidate Gastrografin studies with contrast in colon so make it partial obstruction.  She was started on clear liquid today. -Continue with TPN -Follow general surgery recommendations  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HCC) Vascular surgery was consulted when 2.5 cm aneurysm of iliac artery was noted on CT abdomen.  Decreased right lower extremity pulses but patient is not with critical limb ischemia so they  are recommending outpatient follow-up and a possible right lower extremity angiography and PCI. -Restart home aspirin -Continue Lipitor  Uncontrolled type 2 diabetes mellitus with hyperglycemia, without long-term current use of insulin (HCC) -Continue Levemir at 21 units daily -Continue with SSI  Protein-calorie malnutrition, severe Estimated body mass index is 25.93 kg/m as calculated from the following:   Height as of this encounter: 5\' 3"  (1.6 m).   Weight as of this encounter: 66.4 kg.   -Patient is currently on TPN for nutritional support. -Continue TPN-we will start p.o. per surgery recommendations  HTN (hypertension) Blood pressure elevated.  Home medications are currently on hold due to n.p.o. status. -Adding metoprolol 5 mg every 8 hourly. -As needed labetalol and hydralazine -Continue amlodipine -We will add back losartan if needed      Subjective: Patient was feeling little improved when seen today.  Able to tolerate some clear liquid.  Denies any significant pain.  Saturating well on 4 L of oxygen.  Physical Exam: Vitals:   09/29/22 1100 09/29/22 1141 09/29/22 1200 09/29/22 1302  BP: (!) 142/83  129/77 (!) 150/90  Pulse: (!) 121  (!) 119 (!) 122  Resp: (!) 28  (!) 32 (!) 27  Temp:  98.9 F (37.2 C)    TempSrc:  Oral    SpO2: 95%  93% 97%  Weight:      Height:       General.  Frail elderly lady, in no acute distress. Pulmonary.  Lungs clear bilaterally, normal respiratory effort.  Slightly diminished sounds on right upper and mid zone. CV.  Regular rate and rhythm, no JVD,  rub or murmur. Abdomen.  Soft, nontender, mildly tympanic, hypoactive bowel sounds to CNS.  Alert and oriented .  No focal neurologic deficit. Extremities.  No edema, no cyanosis, pulses intact and symmetrical. Psychiatry.  Appears to have some cognitive impairment.   Data Reviewed: Prior data reviewed  Family Communication: Will  Disposition: Status is: Inpatient Remains  inpatient appropriate because: Severity of illness  Planned Discharge Destination: Home with Home Health  DVT prophylaxis.  Lovenox Time spent: 44 minutes  This record has been created using Systems analyst. Errors have been sought and corrected,but may not always be located. Such creation errors do not reflect on the standard of care.   Author: Lorella Nimrod, MD 09/29/2022 2:01 PM  For on call review [email or web address].

## 2022-09-30 ENCOUNTER — Inpatient Hospital Stay: Payer: Medicare Other

## 2022-09-30 DIAGNOSIS — A419 Sepsis, unspecified organism: Secondary | ICD-10-CM | POA: Diagnosis not present

## 2022-09-30 DIAGNOSIS — J9621 Acute and chronic respiratory failure with hypoxia: Secondary | ICD-10-CM | POA: Diagnosis not present

## 2022-09-30 DIAGNOSIS — K567 Ileus, unspecified: Secondary | ICD-10-CM | POA: Diagnosis not present

## 2022-09-30 DIAGNOSIS — J189 Pneumonia, unspecified organism: Secondary | ICD-10-CM | POA: Diagnosis not present

## 2022-09-30 LAB — BASIC METABOLIC PANEL
Anion gap: 6 (ref 5–15)
BUN: 24 mg/dL — ABNORMAL HIGH (ref 8–23)
CO2: 26 mmol/L (ref 22–32)
Calcium: 9.5 mg/dL (ref 8.9–10.3)
Chloride: 104 mmol/L (ref 98–111)
Creatinine, Ser: 0.65 mg/dL (ref 0.44–1.00)
GFR, Estimated: >60 mL/min (ref >60)
Glucose, Bld: 172 mg/dL — ABNORMAL HIGH (ref 70–99)
Potassium: 3.7 mmol/L (ref 3.5–5.1)
Sodium: 136 mmol/L (ref 135–145)

## 2022-09-30 LAB — GLUCOSE, CAPILLARY
Glucose-Capillary: 163 mg/dL — ABNORMAL HIGH (ref 70–99)
Glucose-Capillary: 164 mg/dL — ABNORMAL HIGH (ref 70–99)
Glucose-Capillary: 184 mg/dL — ABNORMAL HIGH (ref 70–99)
Glucose-Capillary: 210 mg/dL — ABNORMAL HIGH (ref 70–99)
Glucose-Capillary: 265 mg/dL — ABNORMAL HIGH (ref 70–99)
Glucose-Capillary: 270 mg/dL — ABNORMAL HIGH (ref 70–99)
Glucose-Capillary: 284 mg/dL — ABNORMAL HIGH (ref 70–99)

## 2022-09-30 LAB — PHOSPHORUS: Phosphorus: 2.5 mg/dL (ref 2.5–4.6)

## 2022-09-30 LAB — MAGNESIUM: Magnesium: 1.6 mg/dL — ABNORMAL LOW (ref 1.7–2.4)

## 2022-09-30 MED ORDER — FLUTICASONE FUROATE-VILANTEROL 100-25 MCG/ACT IN AEPB
1.0000 | INHALATION_SPRAY | Freq: Every day | RESPIRATORY_TRACT | Status: DC
  Administered 2022-09-30 – 2022-10-05 (×6): 1 via RESPIRATORY_TRACT
  Filled 2022-09-30: qty 28

## 2022-09-30 MED ORDER — INSULIN DETEMIR 100 UNIT/ML ~~LOC~~ SOLN
0.3000 [IU]/kg | SUBCUTANEOUS | Status: DC
  Administered 2022-09-30 – 2022-10-04 (×5): 21 [IU] via SUBCUTANEOUS
  Filled 2022-09-30 (×6): qty 0.21

## 2022-09-30 MED ORDER — TRACE MINERALS CU-MN-SE-ZN 300-55-60-3000 MCG/ML IV SOLN
INTRAVENOUS | Status: AC
  Filled 2022-09-30: qty 576

## 2022-09-30 MED ORDER — MAGNESIUM SULFATE 2 GM/50ML IV SOLN
2.0000 g | Freq: Once | INTRAVENOUS | Status: AC
  Administered 2022-09-30: 2 g via INTRAVENOUS
  Filled 2022-09-30: qty 50

## 2022-09-30 NOTE — Progress Notes (Signed)
Progress Note   Patient: Katherine Carter PPI:[id number] DOB: 1951-03-02 DOA: 09/21/2022     9 DOS: the patient was seen and examined on 09/30/2022   Brief hospital course: ICU transfer on 09/26/2022.  Taken from prior notes.  [name] is a 72 year old female with a past medical history significant for chronic hypoxic respiratory failure requiring 4 L supplemental oxygen due to COPD, diabetes mellitus, hypertension, GERD who presents to [facility] ED on 09/21/2022 due to generalized weakness status post multiple falls, dyspnea, and cough.  Initially admitted in ICU for concern of acute on chronic hypoxic respiratory failure and severe sepsis secondary to multifocal pneumonia, initially requiring BiPAP.  Also found to have AKI and concern of mild DKA.  Patient also had anion gap metabolic acidosis along with hyper kalemia and mild hyponatremia on admission.  Chest x-ray with new patchy right greater than left perihilar opacities and pulmonary vascular congestion.  CT head was without any acute abnormality.  CT cervical spine was without any acute fracture, did show some chronic changes with multilevel spinal canal stenosis and a disc protrusion at C3-C4 level.  CT of chest was negative for PE but did show dense consolidation in the right lung likely representing pneumonia along with emphysematous changes in the lungs.  Respiratory panel was negative.  Blood cultures remain negative.  Urine cultures with less than 10,000 colonies/insignificant growth.  Respiratory viral panel negative.  Strep pneumo and Legionella urinary antigens negative.  MRSA PCR negative.  Patient initially received cefepime and vancomycin and Zithromax, vancomycin was discontinued and she continued with Zithromax and cefepime since 09/21/2022.  Patient remained on BiPAP until 09/25/2022 and able to wean off to high flow nasal cannula. On 09/24/2022 she developed projectile vomiting and abdominal distention, CT abdomen  and pelvis revealed small bowel obstruction and general surgery was consulted.  KUB on 1/22 continued to show SBO but she started having bowel movement and nausea, vomiting and abdominal pain resolved.  CT abdomen with also concern of a 2.5 cm iliac aneurysm with distal thrombosis.  Vascular surgery was consulted as on exam she has V-Care Doppler pulses on the right lower extremity. Per vascular surgery note due to current respiratory status and no immediate danger to limb for critical ischemia, they are recommending outpatient follow-up with a possible right lower extremity angiogram and PCI.  Patient was also started on TPN for nutritional support on 09/25/2022.  Today patient remained  tachycardic and tachypneic, requiring up to 6 L of oxygen with high flow.  CMP this morning shows elevated blood glucose at 570, which has been improved to 205 with intervention.  CBC with improving leukocytosis at 11.8, rest of the labs unremarkable.  Follow-up chest x-ray today with stable right perihilar and upper lobe opacity. KUB with stable small bowel dilatation.  No bowel movement since this morning. Remained n.p.o. and on TPN. Starting her on metoprolol 5 mg every 8 hourly and LR at 75 mL/h as she clinically appears dry. Another message sent to general surgery for their follow-up recommendations.  1/24: Remained mildly tachycardic and tachypneic.  Repeat chest x-ray with right upper lung consolidation and KUB with persistent SBO/ileus.  No free air identified.  Saturating well on baseline oxygen of 4 L.  General surgery did a contrast CT abdomen which shows small bowel obstruction with transition point in the distal ileum in the right lower quadrant, unchanged from prior.  Stable right common iliac artery aneurysm.  Still no flatus or BM. Having on and  off BiPAP to help with breathing.  Chest x-ray with few basal crackles, stopping IV fluid.  Checking BNP with morning labs Goals of care discussion still  pending-palliative care is on board.  1/25: Patient remained mildly tachycardic and tachypneic.  Started passing flatus.  KUB with persistent ileitis/partial SBO.  Not a good surgical candidate, so they will continue conservative management and TPN.  Saturating well on baseline oxygen requirement of 4 L. Will complete the course of antibiotic today.  1/26: Hemodynamically stable, continued to have mild tachycardia.  Gastrografin studies with contrast in colon.  KUB today with persistently dilated small bowel.  Passing flatus and had a small bowel movement yesterday.  Not a good surgical candidate, will continue with conservative management.  She was also started on clear liquid diet today.  1/27: Clinically stable, repeat imaging with persistent SBO.  Surgery would like to continue with conservative management  Patient is high risk for deterioration and mortality.   Assessment and Plan: * Acute on chronic respiratory failure with hypoxia (HCC) Improved.  Currently saturating at baseline oxygen requirement of 4 L Who most likely secondary to pneumonia.  Initially requiring BiPAP,  Still tachycardic and tachypneic although improving than before -Continue with bronchodilators -Continue with supportive care -Continue with supplemental oxygen-wean to baseline as tolerated  Sepsis due to pneumonia [facility]) Patient met sepsis criteria with leukocytosis, tachycardia and tachypnea on admission.  Most likely secondary to multifocal pneumonia. Strep pneumo and Legionella negative.  Cultures remain negative. Patient was treated with cefepime and Zithromax.  Completed the course of Zithromax. -Continue with cefepime -will complete the course today  Ileus [facility]) Patient developed ileus most likely secondary to acute illness.  No surgery done. General surgery was consulted, serial KUB with stable bowel dilatation. Repeat CT abdomen with oral contrast with persistence of SBO with a transition point Bowel  sounds hypoactive , started passing flatus.  Not a good surgical candidate Gastrografin studies with contrast in colon so make it partial obstruction.  She was started on clear liquid today. -Continue with TPN -Follow general surgery recommendations  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HCC) Vascular surgery was consulted when 2.5 cm aneurysm of iliac artery was noted on CT abdomen.  Decreased right lower extremity pulses but patient is not with critical limb ischemia so they are recommending outpatient follow-up and a possible right lower extremity angiography and PCI. -Restart home aspirin -Continue Lipitor  Uncontrolled type 2 diabetes mellitus with hyperglycemia, without long-term current use of insulin (HCC) -Continue Levemir at 21 units daily -Continue with SSI  Protein-calorie malnutrition, severe Estimated body mass index is 25.93 kg/m as calculated from the following:   Height as of this encounter: 5\' 3"  (1.6 m).   Weight as of this encounter: 66.4 kg.   -Patient is currently on TPN for nutritional support. -Continue TPN-we will start p.o. per surgery recommendations  HTN (hypertension) Blood pressure elevated.  Home medications are currently on hold due to n.p.o. status. -Adding metoprolol 5 mg every 8 hourly. -As needed labetalol and hydralazine -Continue amlodipine -We will add back losartan if needed      Subjective: Patient was seen and examined today.  Denies any abdominal pain, nausea or vomiting.  No BM, passing flatus.  Physical Exam: Vitals:   09/30/22 0519 09/30/22 0737 09/30/22 0844 09/30/22 1444  BP: 124/76  120/71 139/87  Pulse: (!) 103 95 97 (!) 129  Resp: 20 20 20    Temp: 97.6 F (36.4 C)  97.9 F (36.6 C)  TempSrc:   Oral   SpO2: 97% 95% 92% 94%  Weight:      Height:       General.  Frail elderly lady, in no acute distress. Pulmonary.  Lungs clear bilaterally, normal respiratory effort. CV.  Regular rate and rhythm, no JVD, rub or murmur. Abdomen.   Soft, nontender, mildly distended, hypoactive bowel sounds CNS.  Alert and oriented .  No focal neurologic deficit. Extremities.  No edema, no cyanosis, pulses intact and symmetrical. Psychiatry.  Appears to have some cognitive impairment.  Data Reviewed: Prior data reviewed  Family Communication:   Disposition: Status is: Inpatient Remains inpatient appropriate because: Severity of illness  Planned Discharge Destination: Home with Home Health  DVT prophylaxis.  Lovenox Time spent: 40 minutes  This record has been created using Systems analyst. Errors have been sought and corrected,but may not always be located. Such creation errors do not reflect on the standard of care.   Author: Lorella Nimrod, MD 09/30/2022 3:31 PM  For on call review [email or web address].

## 2022-09-30 NOTE — Consult Note (Signed)
PHARMACY - TOTAL PARENTERAL NUTRITION CONSULT NOTE   Indication: Small bowel obstruction  Patient Measurements: Height: 5\' 3"  (160 cm) Weight: 66.4 kg (146 lb 6.2 oz) IBW/kg (Calculated) : 52.4 TPN AdjBW (KG): 56.7 Body mass index is 25.93 kg/m. Usual Weight: 70-72 kg  Assessment:  72 y.o. female history of COPD admitted for acute on chronic respiratory failure and sepsis likely from multifocal pneumonia.  Was found to have acute kidney injury.  She was transferred to the ICU last couple of days and yesterday started having some abdominal discomfort and this morning having more abdominal pain.  The pain was diffuse intermittent moderate intensity.  There was a CT scan that was obtained that have personally reviewed showing evidence of dilated loops of bowel but no evidence of pneumatosis or closed-loop obstruction. NG tube could not be placed.  She now feels better.  She continues to have respiratory issues.  She denies any abdominal pain.  She did have a bowel movement and is passing flatus. Pharmacy has been consulted to initiate TPN as patient has not eaten in~5 days  Glucose / Insulin: BG 163-284  insulin last 24h: 41 units SSI  (+ 21 units Levemir q24h and 50 units reg insulin in TPN)-  (on steroids) -pt on jardiance, metformin, ozempic PTA  Electrolytes: Mag 1.6 Renal: SCR<1, stable Hepatic: AST/ALT WNL Intake / Output; MIVF: net (-1.3L), no IVF currently ordered GI Imaging: 1/21 CT of Abdomen: Examination is positive for small bowel obstruction with transition to diminished caliber distal small bowel within the right lower quadrant of the abdomen at the level of distal ileum. GI Surgeries / Procedures:  No current surgical intervention planned  Central access: Patient with PICC already placed prior to admission TPN start date: 09/25/22  Nutritional Goals: Goal TPN rate is 60 mL/hr (provides 86.4 g of protein and 1685 kcals per day)  RD Assessment: Kcal goal: 1600-1800  kcals/day Protein: 80-90 g/day Fluids: 1.4-1.6 L/day Estimated Needs Total Energy Estimated Needs: 1600-1800kcal/day Total Protein Estimated Needs: 80-90g/day Total Fluid Estimated Needs: 1.4-1.6L/day  Current Nutrition:  CLD  Plan:  Continue TPN to goal rate of 33mL/hr at 1800.  On CLD + nutritional supplementation Electrolytes in TPN: Na 58mEq/L, K 10mEq/L, Ca 66mEq/L, Mg 46mEq/L, and Phos 40mmol/L. Cl:Ac 1:1 Add standard MVI and trace elements to TPN -thiamine completed -Mag 1.6  will order magnesium sulfate 2 gm IV x 1 -Continue Resistant q4h SSI and Levemir 21 units q24h and will continue 50 units of insulin to TPN- on methylprednisolone 20 mg IV daily  Monitor TPN labs on Mon/Thurs, daily until stable  Tiearra Colwell A, PharmD Clinical Pharmacist 09/30/2022 10:31 AM

## 2022-09-30 NOTE — Progress Notes (Signed)
Subjective:  CC: Katherine Carter is a 72 y.o. female  Hospital stay day 9,   likely ileus secondary to acute medical issues, SBO also possible, complicated by exacerbated COPD and PNA   HPI: No complaints overnight.  No issues reported overnight.  Passing flatus.  ROS:  General: Denies weight loss, weight gain, fatigue, fevers, chills, and night sweats. Heart: Denies chest pain, palpitations, racing heart, irregular heartbeat, leg pain or swelling, and decreased activity tolerance. Respiratory: Denies breathing difficulty, shortness of breath, wheezing, cough, and sputum. GI: Denies change in appetite, heartburn, nausea, vomiting, constipation, diarrhea, and blood in stool. GU: Denies difficulty urinating, pain with urinating, urgency, frequency, blood in urine.   Objective:   Temp:  [97.6 F (36.4 C)-98.3 F (36.8 C)] 97.9 F (36.6 C) (01/27 0844) Pulse Rate:  [82-125] 97 (01/27 0844) Resp:  [18-45] 20 (01/27 0844) BP: (108-142)/(61-85) 120/71 (01/27 0844) SpO2:  [92 %-98 %] 92 % (01/27 0844)     Height: 5\' 3"  (160 cm) Weight: 66.4 kg BMI (Calculated): 25.94   Intake/Output this shift:   Intake/Output Summary (Last 24 hours) at 09/30/2022 1323 Last data filed at 09/30/2022 0602 Gross per 24 hour  Intake 1134.51 ml  Output 650 ml  Net 484.51 ml    Constitutional :  alert, cooperative, and appears stated age  Respiratory:  clear to auscultation bilaterally  Cardiovascular:  regular rate and rhythm  Gastrointestinal: soft, non-tender; bowel sounds normal; no masses,  no organomegaly.   Skin: Cool and moist.   Psychiatric: Normal affect, non-agitated, not confused       LABS:     Latest Ref Rng & Units 09/30/2022    5:15 AM 09/28/2022    2:48 AM 09/26/2022    4:52 AM  CMP  Glucose 70 - 99 mg/dL 172  190  248   BUN 8 - 23 mg/dL 24  29    Creatinine 0.44 - 1.00 mg/dL 0.65  0.63    Sodium 135 - 145 mmol/L 136  143    Potassium 3.5 - 5.1 mmol/L 3.7  3.8    Chloride  98 - 111 mmol/L 104  111    CO2 22 - 32 mmol/L 26  24    Calcium 8.9 - 10.3 mg/dL 9.5  9.7    Total Protein 6.5 - 8.1 g/dL  7.5    Total Bilirubin 0.3 - 1.2 mg/dL  0.6    Alkaline Phos 38 - 126 U/L  96    AST 15 - 41 U/L  26    ALT 0 - 44 U/L  43        Latest Ref Rng & Units 09/29/2022    5:30 AM 09/28/2022    2:48 AM 09/27/2022    5:23 AM  CBC  WBC 4.0 - 10.5 K/uL 13.9  11.9  12.2   Hemoglobin 12.0 - 15.0 g/dL 10.2  11.8  11.3   Hematocrit 36.0 - 46.0 % 33.4  38.0  37.1   Platelets 150 - 400 K/uL 277  297  324     RADS: CLINICAL DATA:  Ileus.   EXAM: DG ABDOMEN ACUTE WITH 1 VIEW CHEST   COMPARISON:  September 29, 2022.   FINDINGS: Stable right upper lobe airspace opacity is noted. Right-sided PICC line is noted with tip in expected position of the SVC. Normal cardiomediastinal silhouette. Stable small bowel dilatation is noted. Stool and contrast is noted in nondilated colon. Status post cholecystectomy.   IMPRESSION: Stable small bowel  dilatation is noted concerning for ileus or distal small bowel obstruction. Stable right upper lobe airspace opacity is noted.     Electronically Signed   By: Marijo Conception M.D.   On: 09/30/2022 11:53 Assessment:   likely ileus secondary to acute medical issues, SBO also possible, complicated by exacerbated COPD and PNA.  Continues to report passing flatus but x-ray still concerning for dilated loops of bowel after reviewing myself.  Patient otherwise remained stable.  So we will continue current management and monitor.   labs/images/medications/previous chart entries reviewed personally and relevant changes/updates noted above.

## 2022-09-30 NOTE — Progress Notes (Addendum)
Mobility Specialist - Progress Note    09/30/22 1512  Mobility  Activity Ambulated with assistance in room  Level of Assistance Minimal assist, patient does 75% or more  Assistive Device Front wheel walker  Distance Ambulated (ft) 3 ft  Activity Response Tolerated well  Mobility Referral Yes  $Mobility charge 1 Mobility   Pt resting in bed on 4L upon entry. Pt STS and ambulates (3 step turn pivot) to recliner in room SBA. Pt left in recliner with needs in reach and chair alarm activated. RN present in room.  Loma Sender Mobility Specialist 09/30/22, 3:15 PM

## 2022-10-01 DIAGNOSIS — K567 Ileus, unspecified: Secondary | ICD-10-CM | POA: Diagnosis not present

## 2022-10-01 DIAGNOSIS — A419 Sepsis, unspecified organism: Secondary | ICD-10-CM | POA: Diagnosis not present

## 2022-10-01 DIAGNOSIS — J9621 Acute and chronic respiratory failure with hypoxia: Secondary | ICD-10-CM | POA: Diagnosis not present

## 2022-10-01 DIAGNOSIS — J189 Pneumonia, unspecified organism: Secondary | ICD-10-CM | POA: Diagnosis not present

## 2022-10-01 LAB — GLUCOSE, CAPILLARY
Glucose-Capillary: 169 mg/dL — ABNORMAL HIGH (ref 70–99)
Glucose-Capillary: 161 mg/dL — ABNORMAL HIGH (ref 70–99)
Glucose-Capillary: 202 mg/dL — ABNORMAL HIGH (ref 70–99)
Glucose-Capillary: 287 mg/dL — ABNORMAL HIGH (ref 70–99)
Glucose-Capillary: 245 mg/dL — ABNORMAL HIGH (ref 70–99)

## 2022-10-01 LAB — BASIC METABOLIC PANEL
Anion gap: 2 — ABNORMAL LOW (ref 5–15)
BUN: 27 mg/dL — ABNORMAL HIGH (ref 8–23)
CO2: 26 mmol/L (ref 22–32)
Calcium: 9.1 mg/dL (ref 8.9–10.3)
Chloride: 106 mmol/L (ref 98–111)
Creatinine, Ser: 0.65 mg/dL (ref 0.44–1.00)
GFR, Estimated: >60 mL/min (ref >60)
Glucose, Bld: 178 mg/dL — ABNORMAL HIGH (ref 70–99)
Potassium: 4.2 mmol/L (ref 3.5–5.1)
Sodium: 134 mmol/L — ABNORMAL LOW (ref 135–145)

## 2022-10-01 LAB — PHOSPHORUS: Phosphorus: 2.9 mg/dL (ref 2.5–4.6)

## 2022-10-01 LAB — MAGNESIUM: Magnesium: 1.8 mg/dL (ref 1.7–2.4)

## 2022-10-01 MED ORDER — TRACE MINERALS CU-MN-SE-ZN 300-55-60-3000 MCG/ML IV SOLN
INTRAVENOUS | Status: AC
  Filled 2022-10-01: qty 576

## 2022-10-01 MED ORDER — MAGNESIUM SULFATE 2 GM/50ML IV SOLN
2.0000 g | Freq: Once | INTRAVENOUS | Status: AC
  Administered 2022-10-01: 2 g via INTRAVENOUS
  Filled 2022-10-01: qty 50

## 2022-10-01 NOTE — Progress Notes (Signed)
Progress Note   Patient: Katherine Carter HGD:[id number] DOB: June 20, 1951 DOA: 09/21/2022     10 DOS: the patient was seen and examined on 10/01/2022   Brief hospital course: ICU transfer on 09/26/2022.  Taken from prior notes.  Katherine Carter is a 72 year old female with a past medical history significant for chronic hypoxic respiratory failure requiring 4 L supplemental oxygen due to COPD, diabetes mellitus, hypertension, GERD who presents to [facility] ED on 09/21/2022 due to generalized weakness status post multiple falls, dyspnea, and cough.  Initially admitted in ICU for concern of acute on chronic hypoxic respiratory failure and severe sepsis secondary to multifocal pneumonia, initially requiring BiPAP.  Also found to have AKI and concern of mild DKA.  Patient also had anion gap metabolic acidosis along with hyper kalemia and mild hyponatremia on admission.  Chest x-ray with new patchy right greater than left perihilar opacities and pulmonary vascular congestion.  CT head was without any acute abnormality.  CT cervical spine was without any acute fracture, did show some chronic changes with multilevel spinal canal stenosis and a disc protrusion at C3-C4 level.  CT of chest was negative for PE but did show dense consolidation in the right lung likely representing pneumonia along with emphysematous changes in the lungs.  Respiratory panel was negative.  Blood cultures remain negative.  Urine cultures with less than 10,000 colonies/insignificant growth.  Respiratory viral panel negative.  Strep pneumo and Legionella urinary antigens negative.  MRSA PCR negative.  Patient initially received cefepime and vancomycin and Zithromax, vancomycin was discontinued and she continued with Zithromax and cefepime since 09/21/2022.  Patient remained on BiPAP until 09/25/2022 and able to wean off to high flow nasal cannula. On 09/24/2022 she developed projectile vomiting and abdominal distention, CT abdomen  and pelvis revealed small bowel obstruction and general surgery was consulted.  KUB on 1/22 continued to show SBO but she started having bowel movement and nausea, vomiting and abdominal pain resolved.  CT abdomen with also concern of a 2.5 cm iliac aneurysm with distal thrombosis.  Vascular surgery was consulted as on exam she has [facility] Doppler pulses on the right lower extremity. Per vascular surgery note due to current respiratory status and no immediate danger to limb for critical ischemia, they are recommending outpatient follow-up with a possible right lower extremity angiogram and PCI.  Patient was also started on TPN for nutritional support on 09/25/2022.  Today patient remained  tachycardic and tachypneic, requiring up to 6 L of oxygen with high flow.  CMP this morning shows elevated blood glucose at 570, which has been improved to 205 with intervention.  CBC with improving leukocytosis at 11.8, rest of the labs unremarkable.  Follow-up chest x-ray today with stable right perihilar and upper lobe opacity. KUB with stable small bowel dilatation.  No bowel movement since this morning. Remained n.p.o. and on TPN. Starting her on metoprolol 5 mg every 8 hourly and LR at 75 mL/h as she clinically appears dry. Another message sent to general surgery for their follow-up recommendations.  1/24: Remained mildly tachycardic and tachypneic.  Repeat chest x-ray with right upper lung consolidation and KUB with persistent SBO/ileus.  No free air identified.  Saturating well on baseline oxygen of 4 L.  General surgery did a contrast CT abdomen which shows small bowel obstruction with transition point in the distal ileum in the right lower quadrant, unchanged from prior.  Stable right common iliac artery aneurysm.  Still no flatus or BM. Having on and  off BiPAP to help with breathing.  Chest x-ray with few basal crackles, stopping IV fluid.  Checking BNP with morning labs Goals of care discussion still  pending-palliative care is on board.  1/25: Patient remained mildly tachycardic and tachypneic.  Started passing flatus.  KUB with persistent ileitis/partial SBO.  Not a good surgical candidate, so they will continue conservative management and TPN.  Saturating well on baseline oxygen requirement of 4 L. Will complete the course of antibiotic today.  1/26: Hemodynamically stable, continued to have mild tachycardia.  Gastrografin studies with contrast in colon.  KUB today with persistently dilated small bowel.  Passing flatus and had a small bowel movement yesterday.  Not a good surgical candidate, will continue with conservative management.  She was also started on clear liquid diet today.  1/27: Clinically stable, repeat imaging with persistent SBO.  Surgery would like to continue with conservative management.  1/28: Clinically stable.  Tolerating clear liquid diet.  Repeat imaging with stable SBO and lung capacity.  Remained on TPN  Patient is high risk for deterioration and mortality.   Assessment and Plan: * Acute on chronic respiratory failure with hypoxia (HCC) Improved.  Currently saturating at baseline oxygen requirement of 4 L Who most likely secondary to pneumonia.  Initially requiring BiPAP,  Still tachycardic and tachypneic although improving than before -Continue with bronchodilators -Continue with supportive care -Continue with supplemental oxygen-wean to baseline as tolerated  Sepsis due to pneumonia [facility]) Patient met sepsis criteria with leukocytosis, tachycardia and tachypnea on admission.  Most likely secondary to multifocal pneumonia. Strep pneumo and Legionella negative.  Cultures remain negative. Patient was treated with cefepime and Zithromax.  Completed the course of Zithromax. -Continue with cefepime -will complete the course today  Ileus [facility]) Patient developed ileus most likely secondary to acute illness.  No surgery done. General surgery was consulted, serial  KUB with stable bowel dilatation. Repeat CT abdomen with oral contrast with persistence of SBO with a transition point Bowel sounds hypoactive , started passing flatus.  Not a good surgical candidate Gastrografin studies with contrast in colon so make it partial obstruction.  She was started on clear liquid today. -Continue with TPN -Follow general surgery recommendations  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HCC) Vascular surgery was consulted when 2.5 cm aneurysm of iliac artery was noted on CT abdomen.  Decreased right lower extremity pulses but patient is not with critical limb ischemia so they are recommending outpatient follow-up and a possible right lower extremity angiography and PCI. -Restart home aspirin -Continue Lipitor  Uncontrolled type 2 diabetes mellitus with hyperglycemia, without long-term current use of insulin (HCC) -Continue Levemir at 21 units daily -Continue with SSI  Protein-calorie malnutrition, severe Estimated body mass index is 25.93 kg/m as calculated from the following:   Height as of this encounter: 5\' 3"  (1.6 m).   Weight as of this encounter: 66.4 kg.   -Patient is currently on TPN for nutritional support. -Continue TPN-we will start p.o. per surgery recommendations  HTN (hypertension) Blood pressure elevated.  Home medications are currently on hold due to n.p.o. status. -Adding metoprolol 5 mg every 8 hourly. -As needed labetalol and hydralazine -Continue amlodipine -We will add back losartan if needed      Subjective: Patient was lying comfortably when seen today.  Passing flatus, no BM yet.  Physical Exam: Vitals:   09/30/22 2006 10/01/22 0426 10/01/22 0445 10/01/22 0909  BP: 137/83 126/79  117/66  Pulse: (!) 111 (!) 101  93  Resp:  16 16  18   Temp: 98 F (36.7 C) 98.2 F (36.8 C)  98.5 F (36.9 C)  TempSrc:      SpO2: 96% 100%  94%  Weight:   63.1 kg   Height:       General.  Frail elderly lady, in no acute distress. Pulmonary.  Lungs  clear bilaterally, normal respiratory effort. CV.  Regular rate and rhythm, no JVD, rub or murmur. Abdomen.  Soft, nontender, nondistended, BS hypoactive. CNS.  Alert and oriented .  No focal neurologic deficit. Extremities.  No edema, no cyanosis, pulses intact and symmetrical.   Data Reviewed: Prior data reviewed  Family Communication: Called husband with no response  Disposition: Status is: Inpatient Remains inpatient appropriate because: Severity of illness  Planned Discharge Destination: Home with Home Health  DVT prophylaxis.  Lovenox Time spent: 39 minutes  This record has been created using . Errors have been sought and corrected,but may not always be located. Such creation errors do not reflect on the standard of care.   Author: Conservation officer, historic buildings, MD 10/01/2022 2:54 PM  For on call review [email or web address].

## 2022-10-01 NOTE — TOC Progression Note (Signed)
Transition of Care [facility]) - Progression Note    Patient Details  Name: Katherine Carter MRN: [id number] Date of Birth: 1950/09/25  Transition of Care [facility]) CM/SW Contact  Zigmund Daniel Dorian Pod, RN Phone Number:[phone] 10/01/2022, 3:33 PM  Clinical Narrative:    Recommendations for SNF-attempted outreach with spouse Katherine Carter however unsuccessful and unable to leave a message. TOC will continue to make outreach calls for SNF choices.  TOC remains available to assist with discharge disposition.   Expected Discharge Plan: [place] Barriers to Discharge: Continued Medical Work up  Expected Discharge Plan and Services   Discharge Planning Services: CM Consult Post Acute Care Choice: [place] arrangements for the past 2 months: Single Family Home                 DME Arranged: N/A DME Agency: NA                   Social Determinants of Health (SDOH) Interventions SDOH Screenings   Food Insecurity: No Food Insecurity (09/23/2022)  Housing: Low Risk  (09/23/2022)  Transportation Needs: No Transportation Needs (09/23/2022)  Utilities: Not At Risk (09/23/2022)  Tobacco Use: Medium Risk (09/23/2022)    Readmission Risk Interventions     No data to display

## 2022-10-01 NOTE — Progress Notes (Signed)
Subjective:  CC: Katherine Carter is a 72 y.o. female  Hospital stay day 10,   likely ileus secondary to acute medical issues, SBO also possible, complicated by exacerbated COPD and PNA   HPI: No complaints overnight.  Passing flatus, reports bowel movement but not recorded in chart.  ROS:  General: Denies weight loss, weight gain, fatigue, fevers, chills, and night sweats. Heart: Denies chest pain, palpitations, racing heart, irregular heartbeat, leg pain or swelling, and decreased activity tolerance. Respiratory: Denies breathing difficulty, shortness of breath, wheezing, cough, and sputum. GI: Denies change in appetite, heartburn, nausea, vomiting, constipation, diarrhea, and blood in stool. GU: Denies difficulty urinating, pain with urinating, urgency, frequency, blood in urine.   Objective:   Temp:  [98 F (36.7 C)-98.5 F (36.9 C)] 98.2 F (36.8 C) (01/28 1512) Pulse Rate:  [93-111] 110 (01/28 1512) Resp:  [16-18] 18 (01/28 1512) BP: (101-137)/(66-83) 101/78 (01/28 1512) SpO2:  [94 %-100 %] 98 % (01/28 1512) Weight:  [63.1 kg] 63.1 kg (01/28 0445)     Height: 5\' 3"  (160 cm) Weight: 63.1 kg BMI (Calculated): 24.65   Intake/Output this shift:   Intake/Output Summary (Last 24 hours) at 10/01/2022 1540 Last data filed at 10/01/2022 0500 Gross per 24 hour  Intake 835.41 ml  Output 1150 ml  Net -314.59 ml    Constitutional :  alert, cooperative, and appears stated age  Respiratory:  clear to auscultation bilaterally  Cardiovascular:  regular rate and rhythm  Gastrointestinal: soft, non-tender; bowel sounds normal; no masses,  no organomegaly.   Skin: Cool and moist.   Psychiatric: Normal affect, non-agitated, not confused       LABS:     Latest Ref Rng & Units 10/01/2022    5:33 AM 09/30/2022    5:15 AM 09/28/2022    2:48 AM  CMP  Glucose 70 - 99 mg/dL 178  172  190   BUN 8 - 23 mg/dL 27  24  29    Creatinine 0.44 - 1.00 mg/dL 0.65  0.65  0.63   Sodium 135 -  145 mmol/L 134  136  143   Potassium 3.5 - 5.1 mmol/L 4.2  3.7  3.8   Chloride 98 - 111 mmol/L 106  104  111   CO2 22 - 32 mmol/L 26  26  24    Calcium 8.9 - 10.3 mg/dL 9.1  9.5  9.7   Total Protein 6.5 - 8.1 g/dL   7.5   Total Bilirubin 0.3 - 1.2 mg/dL   0.6   Alkaline Phos 38 - 126 U/L   96   AST 15 - 41 U/L   26   ALT 0 - 44 U/L   43       Latest Ref Rng & Units 09/29/2022    5:30 AM 09/28/2022    2:48 AM 09/27/2022    5:23 AM  CBC  WBC 4.0 - 10.5 K/uL 13.9  11.9  12.2   Hemoglobin 12.0 - 15.0 g/dL 10.2  11.8  11.3   Hematocrit 36.0 - 46.0 % 33.4  38.0  37.1   Platelets 150 - 400 K/uL 277  297  324     RADS: N/a Assessment:   likely ileus secondary to acute medical issues, SBO also possible, complicated by exacerbated COPD and PNA.  Continues to report passing flatus also bowel reported per patient but not recorded in the chart.  Per primary team recommendations will continue clear liquid diet for now.  labs/images/medications/previous chart entries  reviewed personally and relevant changes/updates noted above.

## 2022-10-01 NOTE — Plan of Care (Signed)
  Problem: Coping: Goal: Ability to adjust to condition or change in health will improve Outcome: Progressing   Problem: Health Behavior/Discharge Planning: Goal: Ability to manage health-related needs will improve Outcome: Progressing   Problem: Metabolic: Goal: Ability to maintain appropriate glucose levels will improve Outcome: Progressing   Problem: Nutritional: Goal: Maintenance of adequate nutrition will improve Outcome: Progressing   Problem: Education: Goal: Knowledge of General Education information will improve Description: Including pain rating scale, medication(s)/side effects and non-pharmacologic comfort measures Outcome: Progressing   Problem: Health Behavior/Discharge Planning: Goal: Ability to manage health-related needs will improve Outcome: Progressing   Problem: Clinical Measurements: Goal: Will remain free from infection Outcome: Progressing Goal: Diagnostic test results will improve Outcome: Progressing Goal: Respiratory complications will improve Outcome: Progressing Goal: Cardiovascular complication will be avoided Outcome: Progressing   Problem: Activity: Goal: Risk for activity intolerance will decrease Outcome: Progressing   Problem: Nutrition: Goal: Adequate nutrition will be maintained Outcome: Progressing   Problem: Coping: Goal: Level of anxiety will decrease Outcome: Progressing   Problem: Elimination: Goal: Will not experience complications related to urinary retention Outcome: Progressing   Problem: Safety: Goal: Ability to remain free from injury will improve Outcome: Progressing   Problem: Metabolic: Goal: Ability to maintain appropriate glucose levels will improve Outcome: Progressing

## 2022-10-01 NOTE — Progress Notes (Signed)
Physical Therapy Treatment Patient Details Name: Katherine Carter MRN: [id number] DOB: October 11, 1950 Today's Date: 10/01/2022   History of Present Illness Pt admitted for Acute on chronic respiratory failure with hypoxia along with sepsis. Pt with complaints of weakness, falls, and cough. HIstory includes COPD on 4L of O2 at baseline, DM, HTN, and GERD.    PT Comments    Pt seen for PT tx with pt requiring encouragement for participation & appearing to self limit throughout session. Pt is able to complete bed mobility with supervision with use of hospital bed features. Pt is able to complete STS with RW & min assist, ambulate short distance in room with RW & min assist. Pt does experience 1 LOB requiring max assist to correct prior to ambulating. Pt with poor ability to follow instructions/cuing throughout session & appears to have poor safety awareness & limited insight. Continue to recommend STR upon d/c.    Recommendations for follow up therapy are one component of a multi-disciplinary discharge planning process, led by the attending physician.  Recommendations may be updated based on patient status, additional functional criteria and insurance authorization.  Follow Up Recommendations  Skilled nursing-short term rehab (<3 hours/day) Can patient physically be transported by private vehicle: Yes   Assistance Recommended at Discharge Intermittent Supervision/Assistance  Patient can return home with the following A little help with walking and/or transfers;A little help with bathing/dressing/bathroom;Assist for transportation;Help with stairs or ramp for entrance;Assistance with cooking/housework   Equipment Recommendations  Rolling walker (2 wheels)    Recommendations for Other Services       Precautions / Restrictions Precautions Precautions: Fall Restrictions Weight Bearing Restrictions: No     Mobility  Bed Mobility   Bed Mobility: Supine to Sit, Sit to Supine      Supine to sit: Supervision, HOB elevated Sit to supine: Supervision, HOB elevated   General bed mobility comments: use of bed rails    Transfers Overall transfer level: Needs assistance Equipment used: Rolling walker (2 wheels) Transfers: Sit to/from Stand Sit to Stand: Min assist           General transfer comment: Cuing but poor return demo re: safe hand placement during STS from EOB.    Ambulation/Gait Ambulation/Gait assistance: Min assist Gait Distance (Feet): 10 Feet Assistive device: Rolling walker (2 wheels) Gait Pattern/deviations: Decreased step length - left, Decreased step length - right, Decreased dorsiflexion - right, Decreased dorsiflexion - left, Decreased stride length Gait velocity: significantly decreased     General Gait Details: PT provides max cuing/education re: need to ambulate within base of AD but very poor ability to return demo. Pt requests standing rest break leaning on sink 2/2 fatigue. Decreased ability to manage RW in small spaces/around obstacles.   Stairs             Wheelchair Mobility    Modified Rankin (Stroke Patients Only)       Balance Overall balance assessment: Needs assistance Sitting-balance support: Feet supported Sitting balance-Leahy Scale: Fair Sitting balance - Comments: supervision static sitting   Standing balance support: During functional activity, Bilateral upper extremity supported, Reliant on assistive device for balance Standing balance-Leahy Scale: Poor                              Cognition Arousal/Alertness: Awake/alert Behavior During Therapy: Flat affect Overall Cognitive Status: No family/caregiver present to determine baseline cognitive functioning  General Comments: Requires encouragement for participation, appears to have limited insight as pt asking about going home tomorrow, decreased safety awareness, pt is self limiting  throughout session.        Exercises Other Exercises Other Exercises: Attempted to have pt perform 5x STS without BUE support for global strengthening & endurance training, with pt reporting "I can't" despite being able to. Pt transfers STS x 2 with PT providing cuing to come to full upright standing but pt not attempting & declines attempting 5 reps.    General Comments General comments (skin integrity, edema, etc.): Pt on 4L/min via nasal cannula throughout session.      Pertinent Vitals/Pain Pain Assessment Pain Assessment: No/denies pain    Home Living                          Prior Function            PT Goals (current goals can now be found in the care plan section) Acute Rehab PT Goals Patient Stated Goal: to get her strength back PT Goal Formulation: With patient Time For Goal Achievement: 10/07/22 Potential to Achieve Goals: Good Additional Goals Additional Goal #1: Pt will be able to perform bed mobility/transfers with mod I and safe technique in order to improve functional independence Progress towards PT goals: Progressing toward goals    Frequency    Min 2X/week      PT Plan Current plan remains appropriate    Co-evaluation              AM-PAC PT "6 Clicks" Mobility   Outcome Measure  Help needed turning from your back to your side while in a flat bed without using bedrails?: A Little Help needed moving from lying on your back to sitting on the side of a flat bed without using bedrails?: A Little Help needed moving to and from a bed to a chair (including a wheelchair)?: A Little Help needed standing up from a chair using your arms (e.g., wheelchair or bedside chair)?: A Little Help needed to walk in hospital room?: A Little Help needed climbing 3-5 steps with a railing? : A Lot 6 Click Score: 17    End of Session Equipment Utilized During Treatment: Oxygen Activity Tolerance: Patient tolerated treatment well Patient left: in  bed;with call bell/phone within reach;with bed alarm set Nurse Communication: Mobility status PT Visit Diagnosis: Unsteadiness on feet (R26.81);Muscle weakness (generalized) (M62.81);History of falling (Z91.81);Difficulty in walking, not elsewhere classified (R26.2)     Time: 2951-8841 PT Time Calculation (min) (ACUTE ONLY): 17 min  Charges:  $Therapeutic Activity: 8-22 mins                     Lavone Nian, PT, DPT 10/01/22, 11:11 AM   Waunita Schooner 10/01/2022, 11:10 AM

## 2022-10-01 NOTE — Consult Note (Signed)
PHARMACY - TOTAL PARENTERAL NUTRITION CONSULT NOTE   Indication: Small bowel obstruction  Patient Measurements: Height: 5\' 3"  (160 cm) Weight: 63.1 kg (139 lb 1.8 oz) IBW/kg (Calculated) : 52.4 TPN AdjBW (KG): 56.7 Body mass index is 24.64 kg/m. Usual Weight: 70-72 kg  Assessment:  72 y.o. female history of COPD admitted for acute on chronic respiratory failure and sepsis likely from multifocal pneumonia.  Was found to have acute kidney injury.  She was transferred to the ICU last couple of days and yesterday started having some abdominal discomfort and this morning having more abdominal pain.  The pain was diffuse intermittent moderate intensity.  There was a CT scan that was obtained that have personally reviewed showing evidence of dilated loops of bowel but no evidence of pneumatosis or closed-loop obstruction. NG tube could not be placed.  She now feels better.  She continues to have respiratory issues.  She denies any abdominal pain.  She did have a bowel movement and is passing flatus. Pharmacy has been consulted to initiate TPN as patient has not eaten in~5 days  Glucose / Insulin: BG 161-270  insulin last 24h: 41 units SSI  (+ 21 units Levemir q24h (0800) and 50 units reg insulin in TPN)-  -on steroids-methylprednisolone 20mg  inj daily -pt on jardiance, metformin, ozempic PTA  Electrolytes: Mag 1.8 Renal: SCR<1, stable Hepatic: AST/ALT WNL Intake / Output; MIVF: net (-1.3L), no IVF currently ordered GI Imaging: 1/21 CT of Abdomen: Examination is positive for small bowel obstruction with transition to diminished caliber distal small bowel within the right lower quadrant of the abdomen at the level of distal ileum. GI Surgeries / Procedures:  No current surgical intervention planned  Central access: Patient with PICC already placed prior to admission TPN start date: 09/25/22  Nutritional Goals: Goal TPN rate is 60 mL/hr (provides 86.4 g of protein and 1685 kcals per  day)  RD Assessment: Kcal goal: 1600-1800 kcals/day Protein: 80-90 g/day Fluids: 1.4-1.6 L/day Estimated Needs Total Energy Estimated Needs: 1600-1800kcal/day Total Protein Estimated Needs: 80-90g/day Total Fluid Estimated Needs: 1.4-1.6L/day  Current Nutrition:  CLD  Plan:  Continue TPN to goal rate of 74mL/hr at 1800.  On CLD + nutritional supplementation Electrolytes in TPN: Na 58mEq/L, K 75mEq/L, Ca 46mEq/L, Mg 59mEq/L, and Phos 29mmol/L. Cl:Ac 1:1 Add standard MVI and trace elements to TPN -thiamine completed -Mag 1.8  will order magnesium sulfate 2 gm IV x 1 -Continue Resistant q4h SSI and Levemir 21 units q24h and will continue 50 units of insulin to TPN- on methylprednisolone 20 mg IV daily  Monitor TPN labs on Mon/Thurs, daily until stable  Jackie Russman A, PharmD Clinical Pharmacist 10/01/2022 10:08 AM

## 2022-10-02 DIAGNOSIS — K567 Ileus, unspecified: Secondary | ICD-10-CM | POA: Diagnosis not present

## 2022-10-02 DIAGNOSIS — J9621 Acute and chronic respiratory failure with hypoxia: Secondary | ICD-10-CM | POA: Diagnosis not present

## 2022-10-02 DIAGNOSIS — J189 Pneumonia, unspecified organism: Secondary | ICD-10-CM | POA: Diagnosis not present

## 2022-10-02 DIAGNOSIS — A419 Sepsis, unspecified organism: Secondary | ICD-10-CM | POA: Diagnosis not present

## 2022-10-02 LAB — GLUCOSE, CAPILLARY
Glucose-Capillary: 155 mg/dL — ABNORMAL HIGH (ref 70–99)
Glucose-Capillary: 182 mg/dL — ABNORMAL HIGH (ref 70–99)
Glucose-Capillary: 188 mg/dL — ABNORMAL HIGH (ref 70–99)
Glucose-Capillary: 202 mg/dL — ABNORMAL HIGH (ref 70–99)
Glucose-Capillary: 229 mg/dL — ABNORMAL HIGH (ref 70–99)
Glucose-Capillary: 260 mg/dL — ABNORMAL HIGH (ref 70–99)
Glucose-Capillary: 274 mg/dL — ABNORMAL HIGH (ref 70–99)

## 2022-10-02 LAB — PHOSPHORUS: Phosphorus: 3 mg/dL (ref 2.5–4.6)

## 2022-10-02 LAB — COMPREHENSIVE METABOLIC PANEL
ALT: 98 U/L — ABNORMAL HIGH (ref 0–44)
AST: 80 U/L — ABNORMAL HIGH (ref 15–41)
Albumin: 2 g/dL — ABNORMAL LOW (ref 3.5–5.0)
Alkaline Phosphatase: 132 U/L — ABNORMAL HIGH (ref 38–126)
Anion gap: 7 (ref 5–15)
BUN: 27 mg/dL — ABNORMAL HIGH (ref 8–23)
CO2: 27 mmol/L (ref 22–32)
Calcium: 9.2 mg/dL (ref 8.9–10.3)
Chloride: 102 mmol/L (ref 98–111)
Creatinine, Ser: 0.75 mg/dL (ref 0.44–1.00)
GFR, Estimated: >60 mL/min (ref >60)
Glucose, Bld: 186 mg/dL — ABNORMAL HIGH (ref 70–99)
Potassium: 4.5 mmol/L (ref 3.5–5.1)
Sodium: 136 mmol/L (ref 135–145)
Total Bilirubin: 0.4 mg/dL (ref 0.3–1.2)
Total Protein: 7.6 g/dL (ref 6.5–8.1)

## 2022-10-02 LAB — MAGNESIUM: Magnesium: 1.9 mg/dL (ref 1.7–2.4)

## 2022-10-02 LAB — TRIGLYCERIDES: Triglycerides: 106 mg/dL (ref <150)

## 2022-10-02 MED ORDER — ENSURE ENLIVE PO LIQD: 237.0000 mL | Freq: Three times a day (TID) | ORAL | Status: DC

## 2022-10-02 MED ORDER — MAGNESIUM SULFATE IN D5W 1-5 GM/100ML-% IV SOLN
1.0000 g | Freq: Once | INTRAVENOUS | Status: AC
  Administered 2022-10-02: 1 g via INTRAVENOUS
  Filled 2022-10-02: qty 100

## 2022-10-02 MED ORDER — TRACE MINERALS CU-MN-SE-ZN 300-55-60-3000 MCG/ML IV SOLN
INTRAVENOUS | Status: AC
  Filled 2022-10-02: qty 576

## 2022-10-02 MED ORDER — GLUCERNA SHAKE PO LIQD: 237.0000 mL | Freq: Three times a day (TID) | ORAL | Status: DC

## 2022-10-02 NOTE — Progress Notes (Signed)
OT Cancellation Note  Patient Details Name: Katherine Carter MRN: [id number] DOB: [birth year]   Cancelled Treatment:    Reason Eval/Treat Not Completed: Fatigue/lethargy limiting ability to participate;Patient declined, no reason specified. Upon arrival pt reporting nausea, per RN pt recently medicated for nausea however pt defers mobility at this time. Will continue to follow POC as able.   Dessie Coma, M.S. OTR/L  10/02/22, 3:37 PM  ascom [phone]

## 2022-10-02 NOTE — Progress Notes (Signed)
[facility] SURGICAL PROGRESS NOTE (cpt [id number])  Hospital Day(s): 11.   Interval History: Patient seen and examined, no acute events or new complaints overnight. Patient reports she is feeling better. No fever, chills, nausea, emesis, or abdominal pain. Labs this morning are reassuring. No new imaging. Continues to endorse flatus and BM.  Review of Systems:  Constitutional: denies fever, chills  HEENT: denies cough or congestion  Respiratory: denies any shortness of breath; + wheezing Cardiovascular: denies chest pain or palpitations  Gastrointestinal: + distension (improved), denied abdominal pain, nausea, emesis Genitourinary: denies burning with urination or urinary frequency Musculoskeletal: denies pain, decreased motor or sensation  Vital signs in last 24 hours: [min-max] current  Temp:  [98 F (36.7 C)-98.8 F (37.1 C)] 98 F (36.7 C) (01/29 0406) Pulse Rate:  [81-110] 81 (01/29 0406) Resp:  [16-18] 17 (01/29 0406) BP: (101-126)/(66-84) 126/84 (01/29 0406) SpO2:  [94 %-98 %] 98 % (01/29 0406)     Height: 5\' 3"  (160 cm) Weight: 63.1 kg BMI (Calculated): 24.65   Intake/Output last 2 shifts:  01/28 0701 - 01/29 0700 In: 1312.2 [P.O.:720; I.V.:592.2] Out: -    Physical Exam:  Constitutional: alert, cooperative and no distress  HENT: normocephalic without obvious abnormality  Eyes: PERRL, EOM's grossly intact and symmetric  Respiratory: breathing non-labored at rest; undergoing breathing treatment this AM Cardiovascular: tachycardic and sinus rhythm  Gastrointestinal: soft, non-tender, distension is improved this AM, no rebound/guarding. Known umbilical hernia, soft  Musculoskeletal: no edema or wounds, motor and sensation grossly intact, NT    Labs:     Latest Ref Rng & Units 09/29/2022    5:30 AM 09/28/2022    2:48 AM 09/27/2022    5:23 AM  CBC  WBC 4.0 - 10.5 K/uL 13.9  11.9  12.2   Hemoglobin 12.0 - 15.0 g/dL 10.2  11.8  11.3   Hematocrit 36.0 -  46.0 % 33.4  38.0  37.1   Platelets 150 - 400 K/uL 277  297  324       Latest Ref Rng & Units 10/02/2022    5:23 AM 10/01/2022    5:33 AM 09/30/2022    5:15 AM  CMP  Glucose 70 - 99 mg/dL 186  178  172   BUN 8 - 23 mg/dL 27  27  24    Creatinine 0.44 - 1.00 mg/dL 0.75  0.65  0.65   Sodium 135 - 145 mmol/L 136  134  136   Potassium 3.5 - 5.1 mmol/L 4.5  4.2  3.7   Chloride 98 - 111 mmol/L 102  106  104   CO2 22 - 32 mmol/L 27  26  26    Calcium 8.9 - 10.3 mg/dL 9.2  9.1  9.5   Total Protein 6.5 - 8.1 g/dL 7.6     Total Bilirubin 0.3 - 1.2 mg/dL 0.4     Alkaline Phos 38 - 126 U/L 132     AST 15 - 41 U/L 80     ALT 0 - 44 U/L 98        Imaging studies: No new pertinent imaging studies   Assessment/Plan: (ICD-10's: K56.7) 72 y.o. female with likely ileus secondary to acute medical issues, SBO also possible, complicated by exacerbated COPD and PNA               - Advance to FLD cautiously this morning + nutritional supplementation. - Continue TPN; at goal. Hold on weaning for now until certain able to tolerate  diet advancements              - Monitor abdominal examination; on-going bowel function   - +/- serial KUBs as needed              - Pain control prn; limit narcotics - Antiemetics prn - Mobilize; therapies on board; recommending SNF             - Further management per primary service; we will follow    All of the above findings and recommendations were discussed with the patient, and the medical team, and all of patient's questions were answered to her expressed satisfaction.  -- Edison Simon, PA-C [facility] 10/02/2022, 7:18 AM M-F: 7am - 4pm

## 2022-10-02 NOTE — Progress Notes (Signed)
Progress Note   Patient: Katherine Carter UKG:[id number] DOB: 12/13/1950 DOA: 09/21/2022     11 DOS: the patient was seen and examined on 10/02/2022   Brief hospital course: ICU transfer on 09/26/2022.  Taken from prior notes.  Katherine Carter is a 72 year old female with a past medical history significant for chronic hypoxic respiratory failure requiring 4 L supplemental oxygen due to COPD, diabetes mellitus, hypertension, GERD who presents to [facility] ED on 09/21/2022 due to generalized weakness status post multiple falls, dyspnea, and cough.  Initially admitted in ICU for concern of acute on chronic hypoxic respiratory failure and severe sepsis secondary to multifocal pneumonia, initially requiring BiPAP.  Also found to have AKI and concern of mild DKA.  Patient also had anion gap metabolic acidosis along with hyper kalemia and mild hyponatremia on admission.  Chest x-ray with new patchy right greater than left perihilar opacities and pulmonary vascular congestion.  CT head was without any acute abnormality.  CT cervical spine was without any acute fracture, did show some chronic changes with multilevel spinal canal stenosis and a disc protrusion at C3-C4 level.  CT of chest was negative for PE but did show dense consolidation in the right lung likely representing pneumonia along with emphysematous changes in the lungs.  Respiratory panel was negative.  Blood cultures remain negative.  Urine cultures with less than 10,000 colonies/insignificant growth.  Respiratory viral panel negative.  Strep pneumo and Legionella urinary antigens negative.  MRSA PCR negative.  Patient initially received cefepime and vancomycin and Zithromax, vancomycin was discontinued and she continued with Zithromax and cefepime since 09/21/2022.  Patient remained on BiPAP until 09/25/2022 and able to wean off to high flow nasal cannula. On 09/24/2022 she developed projectile vomiting and abdominal distention, CT abdomen  and pelvis revealed small bowel obstruction and general surgery was consulted.  KUB on 1/22 continued to show SBO but she started having bowel movement and nausea, vomiting and abdominal pain resolved.  CT abdomen with also concern of a 2.5 cm iliac aneurysm with distal thrombosis.  Vascular surgery was consulted as on exam she has [facility] Doppler pulses on the right lower extremity. Per vascular surgery note due to current respiratory status and no immediate danger to limb for critical ischemia, they are recommending outpatient follow-up with a possible right lower extremity angiogram and PCI.  Patient was also started on TPN for nutritional support on 09/25/2022.  Today patient remained  tachycardic and tachypneic, requiring up to 6 L of oxygen with high flow.  CMP this morning shows elevated blood glucose at 570, which has been improved to 205 with intervention.  CBC with improving leukocytosis at 11.8, rest of the labs unremarkable.  Follow-up chest x-ray today with stable right perihilar and upper lobe opacity. KUB with stable small bowel dilatation.  No bowel movement since this morning. Remained n.p.o. and on TPN. Starting her on metoprolol 5 mg every 8 hourly and LR at 75 mL/h as she clinically appears dry. Another message sent to general surgery for their follow-up recommendations.  1/24: Remained mildly tachycardic and tachypneic.  Repeat chest x-ray with right upper lung consolidation and KUB with persistent SBO/ileus.  No free air identified.  Saturating well on baseline oxygen of 4 L.  General surgery did a contrast CT abdomen which shows small bowel obstruction with transition point in the distal ileum in the right lower quadrant, unchanged from prior.  Stable right common iliac artery aneurysm.  Still no flatus or BM. Having on and  off BiPAP to help with breathing.  Chest x-ray with few basal crackles, stopping IV fluid.  Checking BNP with morning labs Goals of care discussion still  pending-palliative care is on board.  1/25: Patient remained mildly tachycardic and tachypneic.  Started passing flatus.  KUB with persistent ileitis/partial SBO.  Not a good surgical candidate, so they will continue conservative management and TPN.  Saturating well on baseline oxygen requirement of 4 L. Will complete the course of antibiotic today.  1/26: Hemodynamically stable, continued to have mild tachycardia.  Gastrografin studies with contrast in colon.  KUB today with persistently dilated small bowel.  Passing flatus and had a small bowel movement yesterday.  Not a good surgical candidate, will continue with conservative management.  She was also started on clear liquid diet today.  1/27: Clinically stable, repeat imaging with persistent SBO.  Surgery would like to continue with conservative management.  1/28: Clinically stable.  Tolerating clear liquid diet.  Repeat imaging with stable SBO and lung capacity.  Remained on TPN.  1/29: Clinically stable.  Passing flatus and started getting bowel movement.  Diet advanced to full liquid with advised to use some supplements.  Slight worsening of liver function as patient is on TPN.  Holding statin.  If continue to tolerate p.o. we will start tapering TPN in a day or so  Patient is high risk for deterioration and mortality.   Assessment and Plan: * Acute on chronic respiratory failure with hypoxia (HCC) Improved.  Currently saturating at baseline oxygen requirement of 4 L Who most likely secondary to pneumonia.  Initially requiring BiPAP,  Still tachycardic and tachypneic although improving than before -Continue with bronchodilators -Continue with supportive care -Continue with supplemental oxygen-wean to baseline as tolerated  Sepsis due to pneumonia [facility]) Patient met sepsis criteria with leukocytosis, tachycardia and tachypnea on admission.  Most likely secondary to multifocal pneumonia. Strep pneumo and Legionella negative.  Cultures  remain negative. Patient was treated with cefepime and Zithromax.  Completed the course of Zithromax. -Continue with cefepime -will complete the course today  Ileus [facility]) Improving Patient developed ileus most likely secondary to acute illness.  No surgery done. General surgery was consulted, serial KUB with stable bowel dilatation. Repeat CT abdomen with oral contrast with persistence of SBO with a transition point Bowel sounds hypoactive , started passing flatus.  Not a good surgical candidate Gastrografin studies with contrast in colon so make it partial obstruction.   Diet advanced to full liquid as patient started getting BM and passing flatus -Continue with TPN -Follow general surgery recommendations  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HCC) Vascular surgery was consulted when 2.5 cm aneurysm of iliac artery was noted on CT abdomen.  Decreased right lower extremity pulses but patient is not with critical limb ischemia so they are recommending outpatient follow-up and a possible right lower extremity angiography and PCI. -Restart home aspirin -Continue Lipitor  Uncontrolled type 2 diabetes mellitus with hyperglycemia, without long-term current use of insulin (HCC) -Continue Levemir at 21 units daily -Continue with SSI  Protein-calorie malnutrition, severe Estimated body mass index is 25.93 kg/m as calculated from the following:   Height as of this encounter: 5\' 3"  (1.6 m).   Weight as of this encounter: 66.4 kg.   -Patient is currently on TPN for nutritional support. -Continue TPN-we will start p.o. per surgery recommendations  HTN (hypertension) Blood pressure elevated.  Home medications are currently on hold due to n.p.o. status. -Adding metoprolol 5 mg every 8 hourly. -As  needed labetalol and hydralazine -Continue amlodipine -We will add back losartan if needed      Subjective: Patient was seen and examined today.  No new complaints.  Passing flatus and had a bowel movement.   Tolerating full liquid diet.  Physical Exam: Vitals:   10/02/22 0406 10/02/22 0757 10/02/22 1157 10/02/22 1357  BP: 126/84 105/65  130/75  Pulse: 81 79  93  Resp: 17 16  18   Temp: 98 F (36.7 C) 98.3 F (36.8 C)  98.3 F (36.8 C)  TempSrc: Oral Oral  Oral  SpO2: 98% 97%  96%  Weight:   68.9 kg   Height:       General.  Frail elderly lady, in no acute distress. Pulmonary.  Lungs clear bilaterally, normal respiratory effort. CV.  Regular rate and rhythm, no JVD, rub or murmur. Abdomen.  Soft, nontender, nondistended, BS positive. CNS.  Alert and oriented .  No focal neurologic deficit. Extremities.  No edema, no cyanosis, pulses intact and symmetrical. Psychiatry.  Judgment and insight appears normal.   Data Reviewed: Prior data reviewed  Family Communication: Discussed with husband on phone  Disposition: Status is: Inpatient Remains inpatient appropriate because: Severity of illness  Planned Discharge Destination: Home with Home Health  DVT prophylaxis.  Lovenox Time spent: 40 minutes  This record has been created using . Errors have been sought and corrected,but may not always be located. Such creation errors do not reflect on the standard of care.   Author: Conservation officer, historic buildings, MD 10/02/2022 3:49 PM  For on call review [email or web address].

## 2022-10-02 NOTE — Consult Note (Signed)
PHARMACY - TOTAL PARENTERAL NUTRITION CONSULT NOTE   Indication: Small bowel obstruction  Patient Measurements: Height: 5\' 3"  (160 cm) Weight: 63.1 kg (139 lb 1.8 oz) IBW/kg (Calculated) : 52.4 TPN AdjBW (KG): 56.7 Body mass index is 24.64 kg/m. Usual Weight: 70-72 kg  Assessment:  72 y.o. female history of COPD admitted for acute on chronic respiratory failure and sepsis likely from multifocal pneumonia.  Was found to have acute kidney injury.  She was transferred to the ICU last couple of days and yesterday started having some abdominal discomfort and this morning having more abdominal pain.  The pain was diffuse intermittent moderate intensity.  There was a CT scan that was obtained that have personally reviewed showing evidence of dilated loops of bowel but no evidence of pneumatosis or closed-loop obstruction.Patient continues to have respiratory issues.  She denies any abdominal pain.  She did have a bowel movement and is passing flatus. Pharmacy has been consulted to initiate TPN as patient has not eaten in~5 days  Glucose / Insulin: BG 161-270  insulin last 24h: 22 units SSI  (+ 21 units Levemir q24h (0800) and 50 units reg insulin in TPN) -on steroids-methylprednisolone 20mg  inj daily -pt on jardiance, metformin, ozempic PTA  Electrolytes: K=4.5, Na 136, Mg 1.9, Phos 3.0 Renal: SCR<1, stable Hepatic: AST/ALT elevated, Tbili 0.4 Intake / Output; MIVF: net (-1.3L), no IVF currently ordered GI Imaging: 1/21 CT of Abdomen: Examination is positive for small bowel obstruction with transition to diminished caliber distal small bowel within the right lower quadrant of the abdomen at the level of distal ileum. GI Surgeries / Procedures:  No current surgical intervention planned  Central access: Patient with PICC already placed prior to admission TPN start date: 09/25/22  Nutritional Goals: Goal TPN rate is 60 mL/hr (provides 86.4 g of protein and 1685 kcals per day)  RD  Assessment: Kcal goal: 1600-1800 kcals/day Protein: 80-90 g/day Fluids: 1.4-1.6 L/day Estimated Needs Total Energy Estimated Needs: 1600-1800kcal/day Total Protein Estimated Needs: 80-90g/day Total Fluid Estimated Needs: 1.4-1.6L/day  Current Nutrition: CLD  Plan:  Continue TPN to goal rate of 33mL/hr at 1800.  On clear liquid diet + nutritional supplementation Mg 1.9, will order MgSulfate 1gm IV x 1 dose HOLD atorvastatin today due to elevation in AST/ALT (expected transient while on TPN but will follow closely. Electrolytes in TPN: Na 92mEq/L, K 54mEq/L, Ca 27mEq/L, Mg 57mEq/L, and Phos 15mmol/L. Cl:Ac 1:1 Add standard MVI and trace elements to TPN (thiamine completed) Continue Resistant q4h SSI and  increase  insulin to 55 units (remains on methylprednisolone 20 mg IV daily + insulin detemire 21 uni ts daily) Monitor TPN labs on Mon/Thurs, daily until stable  Buck Mcaffee Rodriguez-Guzman PharmD, BCPS 10/02/2022 8:08 AM

## 2022-10-02 NOTE — TOC Progression Note (Signed)
Transition of Care [facility]) - Progression Note    Patient Details  Name: Katherine Carter MRN: [id number] Date of Birth: [birth year]  Transition of Care [facility]) CM/SW [place], LCSW Phone Number: 10/02/2022, 11:23 AM  Clinical Narrative:   Patient not fully oriented. Called husband, introduced role, and explained that therapy recommendations would be discussed. He's unsure about SNF placement but will discuss with patient and follow up. Will wait to send out SNF referral until surgery starts weaning TPN.  Expected Discharge Plan: [place] Barriers to Discharge: Continued Medical Work up  Expected Discharge Plan and Services   Discharge Planning Services: CM Consult Post Acute Care Choice: [place] arrangements for the past 2 months: Single Family Home                 DME Arranged: N/A DME Agency: NA                   Social Determinants of Health (SDOH) Interventions SDOH Screenings   Food Insecurity: No Food Insecurity (09/23/2022)  Housing: Low Risk  (09/23/2022)  Transportation Needs: No Transportation Needs (09/23/2022)  Utilities: Not At Risk (09/23/2022)  Tobacco Use: Medium Risk (09/23/2022)    Readmission Risk Interventions     No data to display

## 2022-10-02 NOTE — Care Management Important Message (Signed)
Important Message  Patient Details  Name: Katherine Carter MRN: [id number] Date of Birth: Nov 25, 1950   Medicare Important Message Given:  Yes     [name] 10/02/2022, 11:16 AM

## 2022-10-02 NOTE — Progress Notes (Signed)
Nutrition Follow Up Note   DOCUMENTATION CODES:   Severe malnutrition in context of chronic illness  INTERVENTION:   Continue TPN per pharmacy- provides 1685kcal/day and 86g/day protein   Glucerna Shake po TID, each supplement provides 220 kcal and 10 grams of protein  MVI po daily once TPN discontinued   Daily weights   NUTRITION DIAGNOSIS:   Severe Malnutrition related to chronic illness (COPD) as evidenced by severe fat depletion, severe muscle depletion, 10 percent weight loss in 4 months. -ongoing   GOAL:   Patient will meet greater than or equal to 90% of their needs -met with TPN  MONITOR:   PO intake, Supplement acceptance, Labs, Diet advancement, Weight trends, Skin, I & O's, TPN  ASSESSMENT:   72 y/o female with h/o HTN, COPD, DM and GERD who is admitted with DKA, sepsis, CAP, AKI and SBO.  Pt continues to tolerate TPN well at goal rate. Refeed labs stable. Triglycerides wnl. Pt is having BMs and reports passing flatus. Pt advanced to a full liquid diet today. Pt is eating <25% of meals in hospital. RD will add supplements to help pt meet her estimated needs. Recommend continue TPN until pt is able to eat at least 50% of a soft diet. Per chart, pt appears weight stable since admission. Pt +580ml on her I & Os.   Medications reviewed and include: aspirin, lovenox, insulin, solu-medrol, reglan  Labs reviewed: K 4.5 wnl, BUN 27(H), P 3.0 wnl, Mg 1.9 wnl Triglycerides- 106 Wbc- 13.9(H), Hgb 10.2(L), Hct 33.4(L) Cbgs- 274, 182, 188, 202 x 24 hrs   Diet Order:   Diet Order             Diet full liquid Fluid consistency: Thin  Diet effective now                  EDUCATION NEEDS:   Education needs have been addressed  Skin:  Skin Assessment: Reviewed RN Assessment  Last BM:  1/28- type 7  Height:   Ht Readings from Last 1 Encounters:  09/21/22 5\' 3"  (1.6 m)    Weight:   Wt Readings from Last 1 Encounters:  10/02/22 68.9 kg    Ideal Body  Weight:  52 kg  BMI:  Body mass index is 26.91 kg/m.  Estimated Nutritional Needs:   Kcal:  1600-1800kcal/day  Protein:  80-90g/day  Fluid:  1.4-1.6L/day  Koleen Distance MS, RD, LDN Please refer to [facility] for RD and/or RD on-call/weekend/after hours pager

## 2022-10-03 DIAGNOSIS — K567 Ileus, unspecified: Secondary | ICD-10-CM | POA: Diagnosis not present

## 2022-10-03 DIAGNOSIS — A419 Sepsis, unspecified organism: Secondary | ICD-10-CM | POA: Diagnosis not present

## 2022-10-03 DIAGNOSIS — J9621 Acute and chronic respiratory failure with hypoxia: Secondary | ICD-10-CM | POA: Diagnosis not present

## 2022-10-03 DIAGNOSIS — J189 Pneumonia, unspecified organism: Secondary | ICD-10-CM | POA: Diagnosis not present

## 2022-10-03 LAB — GLUCOSE, CAPILLARY
Glucose-Capillary: 134 mg/dL — ABNORMAL HIGH (ref 70–99)
Glucose-Capillary: 179 mg/dL — ABNORMAL HIGH (ref 70–99)
Glucose-Capillary: 185 mg/dL — ABNORMAL HIGH (ref 70–99)
Glucose-Capillary: 200 mg/dL — ABNORMAL HIGH (ref 70–99)
Glucose-Capillary: 205 mg/dL — ABNORMAL HIGH (ref 70–99)
Glucose-Capillary: 294 mg/dL — ABNORMAL HIGH (ref 70–99)

## 2022-10-03 MED ORDER — TRACE MINERALS CU-MN-SE-ZN 300-55-60-3000 MCG/ML IV SOLN
INTRAVENOUS | Status: AC
  Filled 2022-10-03: qty 288

## 2022-10-03 MED ORDER — LATANOPROST 0.005 % OP SOLN
1.0000 [drp] | Freq: Every day | OPHTHALMIC | Status: DC
  Administered 2022-10-03 – 2022-10-04 (×2): 1 [drp] via OPHTHALMIC
  Filled 2022-10-03: qty 2.5

## 2022-10-03 NOTE — Progress Notes (Signed)
[facility] SURGICAL PROGRESS NOTE (cpt [id number])  Hospital Day(s): 12.   Interval History: Patient seen and examined, no acute events or new complaints overnight. Patient reports she is doing well. No abdominal pain. Distension improved. No fever, chills, nausea, emesis. No new labs nor imaging this AM. Advanced to FLD; but did have some nausea with this.   Review of Systems:  Constitutional: denies fever, chills  HEENT: denies cough or congestion  Respiratory: denies any shortness of breath; + wheezing Cardiovascular: denies chest pain or palpitations  Gastrointestinal: + distension (improved), denied abdominal pain, nausea, emesis Genitourinary: denies burning with urination or urinary frequency Musculoskeletal: denies pain, decreased motor or sensation  Vital signs in last 24 hours: [min-max] current  Temp:  [98.3 F (36.8 C)-98.5 F (36.9 C)] 98.5 F (36.9 C) (01/30 0352) Pulse Rate:  [77-93] 77 (01/30 0540) Resp:  [16-18] 18 (01/30 0352) BP: (105-139)/(65-85) 120/84 (01/30 0540) SpO2:  [96 %-98 %] 97 % (01/30 0352) Weight:  [68.1 kg-68.9 kg] 68.1 kg (01/30 0455)     Height: 5\' 3"  (160 cm) Weight: 68.1 kg BMI (Calculated): 26.6   Intake/Output last 2 shifts:  01/29 0701 - 01/30 0700 In: 1545.2 [I.V.:1545.2] Out: 550 [Urine:550]   Physical Exam:  Constitutional: alert, cooperative and no distress  HENT: normocephalic without obvious abnormality  Eyes: PERRL, EOM's grossly intact and symmetric  Respiratory: breathing non-labored at rest; undergoing breathing treatment this AM Cardiovascular: tachycardic and sinus rhythm  Gastrointestinal: soft, non-tender, distension is improved this AM, no rebound/guarding. Known umbilical hernia, soft  Musculoskeletal: no edema or wounds, motor and sensation grossly intact, NT    Labs:     Latest Ref Rng & Units 09/29/2022    5:30 AM 09/28/2022    2:48 AM 09/27/2022    5:23 AM  CBC  WBC 4.0 - 10.5 K/uL 13.9  11.9  12.2    Hemoglobin 12.0 - 15.0 g/dL 10.2  11.8  11.3   Hematocrit 36.0 - 46.0 % 33.4  38.0  37.1   Platelets 150 - 400 K/uL 277  297  324       Latest Ref Rng & Units 10/02/2022    5:23 AM 10/01/2022    5:33 AM 09/30/2022    5:15 AM  CMP  Glucose 70 - 99 mg/dL 186  178  172   BUN 8 - 23 mg/dL 27  27  24    Creatinine 0.44 - 1.00 mg/dL 0.75  0.65  0.65   Sodium 135 - 145 mmol/L 136  134  136   Potassium 3.5 - 5.1 mmol/L 4.5  4.2  3.7   Chloride 98 - 111 mmol/L 102  106  104   CO2 22 - 32 mmol/L 27  26  26    Calcium 8.9 - 10.3 mg/dL 9.2  9.1  9.5   Total Protein 6.5 - 8.1 g/dL 7.6     Total Bilirubin 0.3 - 1.2 mg/dL 0.4     Alkaline Phos 38 - 126 U/L 132     AST 15 - 41 U/L 80     ALT 0 - 44 U/L 98        Imaging studies: No new pertinent imaging studies   Assessment/Plan: (ICD-10's: K21.7) 72 y.o. female with likely ileus secondary to acute medical issues, SBO also possible, complicated by exacerbated COPD and PNA               - Continue FLD for now + nutritional supplementation. She did report  nausea yesterday with grits; potentially advance tomorrow pending condition - Okay to wean TPN to 1/2 rate today; will continue at 1/2 rate until certain she can tolerate a more regular diet  - No need for NGT currently; No role for surgical intervention currently; she is at high risk for morbidity/mortality              - Monitor abdominal examination; on-going bowel function   - +/- serial KUBs as needed              - Pain control prn; limit narcotics - Antiemetics prn - Mobilize; therapies on board; recommending SNF             - Further management per primary service; we will follow    All of the above findings and recommendations were discussed with the patient, and the medical team, and all of patient's questions were answered to her expressed satisfaction.  -- Edison Simon, PA-C [facility] 10/03/2022, 7:10 AM M-F: 7am - 4pm

## 2022-10-03 NOTE — TOC Progression Note (Addendum)
Transition of Care [facility]) - Progression Note    Patient Details  Name: Katherine Carter MRN: [id number] Date of Birth: 1950/09/21  Transition of Care [facility]) CM/SW [place], LCSW Phone Number: 10/03/2022, 12:35 PM  Clinical Narrative:  CSW met with patient to discuss therapy recommendations. Patient declined SNF placement but is agreeable to home health. She said her husband may have a preference in agency. Tried calling but voicemail is not set up. Will try again later. Patient confirmed she is on 4 L oxygen at home and said she gets it through [employer]. CSW is not familiar with the company. Patient confirmed her husband would transport her home at discharge and bring her oxygen for the ride.  2:16 pm: Received call back from husband. Provided update. He stated patient gets her oxygen through [place].  Expected Discharge Plan: [place] Barriers to Discharge: Continued Medical Work up  Expected Discharge Plan and Services   Discharge Planning Services: CM Consult Post Acute Care Choice: [place] arrangements for the past 2 months: Single Family Home                 DME Arranged: N/A DME Agency: NA                   Social Determinants of Health (SDOH) Interventions SDOH Screenings   Food Insecurity: No Food Insecurity (09/23/2022)  Housing: Low Risk  (09/23/2022)  Transportation Needs: No Transportation Needs (09/23/2022)  Utilities: Not At Risk (09/23/2022)  Tobacco Use: Medium Risk (09/23/2022)    Readmission Risk Interventions     No data to display

## 2022-10-03 NOTE — Progress Notes (Signed)
Progress Note   Patient: Katherine Carter ZOX:[id number] DOB: April 09, 1951 DOA: 09/21/2022     12 DOS: the patient was seen and examined on 10/03/2022   Brief hospital course: ICU transfer on 09/26/2022.  Taken from prior notes.  Katherine Carter is a 72 year old female with a past medical history significant for chronic hypoxic respiratory failure requiring 4 L supplemental oxygen due to COPD, diabetes mellitus, hypertension, GERD who presents to [facility] ED on 09/21/2022 due to generalized weakness status post multiple falls, dyspnea, and cough.  Initially admitted in ICU for concern of acute on chronic hypoxic respiratory failure and severe sepsis secondary to multifocal pneumonia, initially requiring BiPAP.  Also found to have AKI and concern of mild DKA.  Patient also had anion gap metabolic acidosis along with hyper kalemia and mild hyponatremia on admission.  Chest x-ray with new patchy right greater than left perihilar opacities and pulmonary vascular congestion.  CT head was without any acute abnormality.  CT cervical spine was without any acute fracture, did show some chronic changes with multilevel spinal canal stenosis and a disc protrusion at C3-C4 level.  CT of chest was negative for PE but did show dense consolidation in the right lung likely representing pneumonia along with emphysematous changes in the lungs.  Respiratory panel was negative.  Blood cultures remain negative.  Urine cultures with less than 10,000 colonies/insignificant growth.  Respiratory viral panel negative.  Strep pneumo and Legionella urinary antigens negative.  MRSA PCR negative.  Patient initially received cefepime and vancomycin and Zithromax, vancomycin was discontinued and she continued with Zithromax and cefepime since 09/21/2022.  Patient remained on BiPAP until 09/25/2022 and able to wean off to high flow nasal cannula. On 09/24/2022 she developed projectile vomiting and abdominal distention, CT abdomen  and pelvis revealed small bowel obstruction and general surgery was consulted.  KUB on 1/22 continued to show SBO but she started having bowel movement and nausea, vomiting and abdominal pain resolved.  CT abdomen with also concern of a 2.5 cm iliac aneurysm with distal thrombosis.  Vascular surgery was consulted as on exam she has [facility] Doppler pulses on the right lower extremity. Per vascular surgery note due to current respiratory status and no immediate danger to limb for critical ischemia, they are recommending outpatient follow-up with a possible right lower extremity angiogram and PCI.  Patient was also started on TPN for nutritional support on 09/25/2022.  Today patient remained  tachycardic and tachypneic, requiring up to 6 L of oxygen with high flow.  CMP this morning shows elevated blood glucose at 570, which has been improved to 205 with intervention.  CBC with improving leukocytosis at 11.8, rest of the labs unremarkable.  Follow-up chest x-ray today with stable right perihilar and upper lobe opacity. KUB with stable small bowel dilatation.  No bowel movement since this morning. Remained n.p.o. and on TPN. Starting her on metoprolol 5 mg every 8 hourly and LR at 75 mL/h as she clinically appears dry. Another message sent to general surgery for their follow-up recommendations.  1/24: Remained mildly tachycardic and tachypneic.  Repeat chest x-ray with right upper lung consolidation and KUB with persistent SBO/ileus.  No free air identified.  Saturating well on baseline oxygen of 4 L.  General surgery did a contrast CT abdomen which shows small bowel obstruction with transition point in the distal ileum in the right lower quadrant, unchanged from prior.  Stable right common iliac artery aneurysm.  Still no flatus or BM. Having on and  off BiPAP to help with breathing.  Chest x-ray with few basal crackles, stopping IV fluid.  Checking BNP with morning labs Goals of care discussion still  pending-palliative care is on board.  1/25: Patient remained mildly tachycardic and tachypneic.  Started passing flatus.  KUB with persistent ileitis/partial SBO.  Not a good surgical candidate, so they will continue conservative management and TPN.  Saturating well on baseline oxygen requirement of 4 L. Will complete the course of antibiotic today.  1/26: Hemodynamically stable, continued to have mild tachycardia.  Gastrografin studies with contrast in colon.  KUB today with persistently dilated small bowel.  Passing flatus and had a small bowel movement yesterday.  Not a good surgical candidate, will continue with conservative management.  She was also started on clear liquid diet today.  1/27: Clinically stable, repeat imaging with persistent SBO.  Surgery would like to continue with conservative management.  1/28: Clinically stable.  Tolerating clear liquid diet.  Repeat imaging with stable SBO and lung capacity.  Remained on TPN.  1/29: Clinically stable.  Passing flatus and started getting bowel movement.  Diet advanced to full liquid with advised to use some supplements.  Slight worsening of liver function as patient is on TPN.  Holding statin.  If continue to tolerate p.o. we will start tapering TPN in a day or so  1/30: Patient remained stable.  Tolerating full liquid diet.  TPN to reduce to half rate today.  Patient is high risk for deterioration and mortality.    Assessment and Plan: * Acute on chronic respiratory failure with hypoxia (HCC) Improved.  Currently saturating at baseline oxygen requirement of 4 L Who most likely secondary to pneumonia.  Initially requiring BiPAP,  Still tachycardic and tachypneic although improving than before -Continue with bronchodilators -Continue with supportive care -Continue with supplemental oxygen-wean to baseline as tolerated  Sepsis due to pneumonia [facility]) Patient met sepsis criteria with leukocytosis, tachycardia and tachypnea on  admission.  Most likely secondary to multifocal pneumonia. Strep pneumo and Legionella negative.  Cultures remain negative. Patient was treated with cefepime and Zithromax.  Completed the course of Zithromax. -Continue with cefepime -will complete the course today  Ileus [facility]) Improving Patient developed ileus most likely secondary to acute illness.  No surgery done.   Not a good surgical candidate  Started having bowel movement and passing flatus.  Currently tolerating full liquid diet. -Surgery recommend decreasing the rate of TPN with a plan to stop in 1 to 2 days -Follow general surgery recommendations  Aneurysm of iliac artery [facility]) Vascular surgery was consulted when 2.5 cm aneurysm of iliac artery was noted on CT abdomen.  Decreased right lower extremity pulses but patient is not with critical limb ischemia so they are recommending outpatient follow-up and a possible right lower extremity angiography and PCI. -Restart home aspirin -Continue Lipitor  Uncontrolled type 2 diabetes mellitus with hyperglycemia, without long-term current use of insulin (HCC) -Continue Levemir at 21 units daily -Continue with SSI  Protein-calorie malnutrition, severe Estimated body mass index is 25.93 kg/m as calculated from the following:   Height as of this encounter: 5\' 3"  (1.6 m).   Weight as of this encounter: 66.4 kg.   -Patient is currently on TPN for nutritional support. -Continue TPN-we will start p.o. per surgery recommendations  HTN (hypertension) Blood pressure currently within goal. -Continue amlodipine -We will add back losartan if needed      Subjective: Patient was feeling much improved and sitting in chair when seen  today.  Passing flatus and had a bowel movement last night.  Tolerating full liquid well.  No nausea or vomiting  Physical Exam: Vitals:   10/03/22 0352 10/03/22 0455 10/03/22 0540 10/03/22 0825  BP: 122/85  120/84 119/70  Pulse: 80  77 71  Resp: 18   16   Temp: 98.5 F (36.9 C)   97.9 F (36.6 C)  TempSrc: Oral   Oral  SpO2: 97%   96%  Weight:  68.1 kg    Height:       General.     In no acute distress. Pulmonary.  Lungs clear bilaterally, normal respiratory effort. CV.  Regular rate and rhythm, no JVD, rub or murmur. Abdomen.  Soft, nontender, nondistended, BS positive. CNS.  Alert and oriented .  No focal neurologic deficit. Extremities.  No edema, no cyanosis, pulses intact and symmetrical. Psychiatry.  Judgment and insight appears normal.    Data Reviewed: Prior data reviewed  Family Communication:   Disposition: Status is: Inpatient Remains inpatient appropriate because: Severity of illness  Planned Discharge Destination: Home with Home Health  DVT prophylaxis.  Lovenox Time spent: 38 minutes  This record has been created using Systems analyst. Errors have been sought and corrected,but may not always be located. Such creation errors do not reflect on the standard of care.   Author: Lorella Nimrod, MD 10/03/2022 1:58 PM  For on call review [email or web address].

## 2022-10-03 NOTE — Progress Notes (Signed)
Mobility Specialist - Progress Note   10/03/22 1016  Mobility  Activity Transferred from bed to chair;Ambulated with assistance in room  Level of Assistance Standby assist, set-up cues, supervision of patient - no hands on  Assistive Device Front wheel walker  Distance Ambulated (ft) 4 ft  Activity Response Tolerated well  Mobility Referral Yes  $Mobility charge 1 Mobility   Pt supine upon entry, utilizing [place]. Pt completed bed mob MinA HHA, STS to RW MinA and amb SBA. Pt transferred to recliner, min cues for sequencing side and backward steps. Pt left sitting in recliner with alarm set and needs within reach.   [place] Mobility Specialist 10/03/22 10:18 AM

## 2022-10-03 NOTE — Consult Note (Signed)
PHARMACY - TOTAL PARENTERAL NUTRITION CONSULT NOTE   Indication: Small bowel obstruction  Patient Measurements: Height: 5\' 3"  (160 cm) Weight: 68.1 kg (150 lb 2.1 oz) IBW/kg (Calculated) : 52.4 TPN AdjBW (KG): 56.7 Body mass index is 26.59 kg/m. Usual Weight: 70-72 kg  Assessment:  72 y.o. female history of COPD admitted for acute on chronic respiratory failure and sepsis likely from multifocal pneumonia.  Was found to have acute kidney injury.  She was transferred to the ICU last couple of days and yesterday started having some abdominal discomfort and this morning having more abdominal pain.  The pain was diffuse intermittent moderate intensity.  There was a CT scan that was obtained that have personally reviewed showing evidence of dilated loops of bowel but no evidence of pneumatosis or closed-loop obstruction.Patient continues to have respiratory issues.  She denies any abdominal pain.  She did have a bowel movement and is passing flatus. Pharmacy has been consulted to initiate TPN as patient has not eaten in~5 days  Glucose / Insulin: BG 155 - 274   insulin last 24h: 41 units SSI  (+ 21 units Levemir q24h (0800) and 55 units reg insulin in TPN) -on steroids-methylprednisolone 20mg  inj daily -pt on jardiance, metformin, ozempic PTA  Electrolytes: Kno electrolyte results today Renal: SCR<1, stable Hepatic: AST/ALT elevated, Tbili 0.4 Intake / Output; MIVF: net (-1.3L), no IVF currently ordered GI Imaging: 1/21 CT of Abdomen: Examination is positive for small bowel obstruction with transition to diminished caliber distal small bowel within the right lower quadrant of the abdomen at the level of distal ileum. GI Surgeries / Procedures:  No current surgical intervention planned  Central access: Patient with PICC already placed prior to admission TPN start date: 09/25/22  Nutritional Goals: Goal TPN rate is 60 mL/hr (provides 86.4 g of protein and 1685 kcals per day)  RD  Assessment: Kcal goal: 1600-1800 kcals/day Protein: 80-90 g/day Fluids: 1.4-1.6 L/day Estimated Needs Total Energy Estimated Needs: 1600-1800kcal/day Total Protein Estimated Needs: 80-90g/day Total Fluid Estimated Needs: 1.4-1.6L/day  Current Nutrition: FLD  Plan:  reduce TPN to 22mL/hr at 1800 per MD order  On full liquid diet + nutritional supplementation Electrolytes in TPN: Na 37mEq/L, K 20mEq/L, Ca 1mEq/L, Mg 58mEq/L, and Phos 22mmol/L. Cl:Ac 1:1 Add standard MVI and trace elements to TPN (thiamine completed) Continue Resistant q4h SSI and  reduce  insulin to 30 units (remains on methylprednisolone 20 mg IV daily + insulin detemire 21 units daily) Monitor TPN labs on Mon/Thurs, daily until stable  Vallery Sa, PharmD, BCPS 10/03/2022 7:47 AM

## 2022-10-03 NOTE — Progress Notes (Signed)
Physical Therapy Treatment Patient Details Name: Katherine Carter MRN: [id number] DOB: 20-Mar-1951 Today's Date: 10/03/2022   History of Present Illness Pt admitted for Acute on chronic respiratory failure with hypoxia along with sepsis. Pt with complaints of weakness, falls, and cough. HIstory includes COPD on 4L of O2 at baseline, DM, HTN, and GERD.    PT Comments    Pt up in chair with mobility tech.  Pt in chair and had removed O2. Sats 92% and unable to tell me how long it has been since she removed it.  Replaced.  Stated she wears 4 lpm at home at baseline.  She initially declined session stating she was tired from just getting up to chair.  Encouraged participation as she asked me to come back after lunch.  Discussed with pt that my concern was she would be fatigued from chair and not want to walk at that time.  She reluctantly agreed.  She needs heavy cues for hand placements and seemed confused by request putting hands on chair then back onto walker "What do you want me to do now?"  Questioned if it was confusion or she was upset at being asked to walk. She does stand with continued cues and is able to walk with slow generally weak gait but no LOB or buckling to doorway then refuses to walk further.  Wheelchair follow is available as I was hoping she would progress into hallway.  She opts to sit in the chair and is wheeled back to bedside.  Discussed at length discharge plan.  She stated she lives with husband and would be going home.  Reviewed recommendation for SNF and she stated she wanted to return home.  Discussed progress to date and encouraged participation in therapy session and increasing mobility daily.  She did agree she would struggle at home at this time.  Pt will need continued encouragement to participate in sessions in order to return home with husband upon discharge.    Recommendations for follow up therapy are one component of a multi-disciplinary discharge planning  process, led by the attending physician.  Recommendations may be updated based on patient status, additional functional criteria and insurance authorization.  Follow Up Recommendations        Assistance Recommended at Discharge Intermittent Supervision/Assistance  Patient can return home with the following A little help with walking and/or transfers;A little help with bathing/dressing/bathroom;Assist for transportation;Help with stairs or ramp for entrance;Assistance with cooking/housework   Equipment Recommendations  Rolling walker (2 wheels)    Recommendations for Other Services       Precautions / Restrictions Precautions Precautions: Fall Restrictions Weight Bearing Restrictions: No     Mobility  Bed Mobility               General bed mobility comments: in chair before and after    Transfers Overall transfer level: Needs assistance Equipment used: Rolling walker (2 wheels) Transfers: Sit to/from Stand Sit to Stand: Min assist           General transfer comment: heavy verbal cues for hand placements    Ambulation/Gait Ambulation/Gait assistance: Min guard, Min assist Gait Distance (Feet): 6 Feet Assistive device: Rolling walker (2 wheels) Gait Pattern/deviations: Decreased step length - left, Decreased step length - right, Decreased dorsiflexion - right, Decreased dorsiflexion - left, Decreased stride length Gait velocity: significantly decreased         Chief Strategy Officer  Modified Rankin (Stroke Patients Only)       Balance Overall balance assessment: Needs assistance Sitting-balance support: Feet supported Sitting balance-Leahy Scale: Fair     Standing balance support: During functional activity, Bilateral upper extremity supported, Reliant on assistive device for balance Standing balance-Leahy Scale: Poor                              Cognition Arousal/Alertness: Awake/alert Behavior During  Therapy: WFL for tasks assessed/performed, Flat affect Overall Cognitive Status: Within Functional Limits for tasks assessed                                 General Comments: much encouragement to participate.  need heavy cues for hand placements        Exercises      General Comments        Pertinent Vitals/Pain Pain Assessment Pain Assessment: No/denies pain    Home Living                          Prior Function            PT Goals (current goals can now be found in the care plan section) Progress towards PT goals: Progressing toward goals    Frequency    Min 2X/week      PT Plan Current plan remains appropriate    Co-evaluation              AM-PAC PT "6 Clicks" Mobility   Outcome Measure  Help needed turning from your back to your side while in a flat bed without using bedrails?: A Little Help needed moving from lying on your back to sitting on the side of a flat bed without using bedrails?: A Little Help needed moving to and from a bed to a chair (including a wheelchair)?: A Little Help needed standing up from a chair using your arms (e.g., wheelchair or bedside chair)?: A Little Help needed to walk in hospital room?: A Little Help needed climbing 3-5 steps with a railing? : A Lot 6 Click Score: 17    End of Session Equipment Utilized During Treatment: Gait belt;Oxygen Activity Tolerance: Patient tolerated treatment well Patient left: in chair;with call bell/phone within reach;with chair alarm set Nurse Communication: Mobility status PT Visit Diagnosis: Unsteadiness on feet (R26.81);Muscle weakness (generalized) (M62.81);History of falling (Z91.81);Difficulty in walking, not elsewhere classified (R26.2)     Time: 6967-8938 PT Time Calculation (min) (ACUTE ONLY): 16 min  Charges:  $Gait Training: 8-22 mins                   Chesley Noon, PTA 10/03/22, 10:48 AM

## 2022-10-03 NOTE — Assessment & Plan Note (Signed)
Blood pressure currently within goal. -Continue amlodipine -We will add back losartan if needed

## 2022-10-04 DIAGNOSIS — J9621 Acute and chronic respiratory failure with hypoxia: Secondary | ICD-10-CM | POA: Diagnosis not present

## 2022-10-04 DIAGNOSIS — K567 Ileus, unspecified: Secondary | ICD-10-CM | POA: Diagnosis not present

## 2022-10-04 LAB — GLUCOSE, CAPILLARY
Glucose-Capillary: 137 mg/dL — ABNORMAL HIGH (ref 70–99)
Glucose-Capillary: 131 mg/dL — ABNORMAL HIGH (ref 70–99)
Glucose-Capillary: 127 mg/dL — ABNORMAL HIGH (ref 70–99)
Glucose-Capillary: 184 mg/dL — ABNORMAL HIGH (ref 70–99)
Glucose-Capillary: 189 mg/dL — ABNORMAL HIGH (ref 70–99)
Glucose-Capillary: 76 mg/dL (ref 70–99)

## 2022-10-04 MED ORDER — ENSURE ENLIVE PO LIQD
237.0000 mL | Freq: Three times a day (TID) | ORAL | Status: DC
  Administered 2022-10-04: 237 mL via ORAL

## 2022-10-04 MED ORDER — ADULT MULTIVITAMIN W/MINERALS CH
1.0000 | ORAL_TABLET | Freq: Every day | ORAL | Status: DC
  Administered 2022-10-05: 1 via ORAL
  Filled 2022-10-04: qty 1

## 2022-10-04 MED ORDER — TRACE MINERALS CU-MN-SE-ZN 300-55-60-3000 MCG/ML IV SOLN
INTRAVENOUS | Status: DC
  Filled 2022-10-04: qty 288

## 2022-10-04 NOTE — Progress Notes (Signed)
Progress Note   Patient: Katherine Carter:[id number] DOB: 12/20/1950 DOA: 09/21/2022     13 DOS: the patient was seen and examined on 10/04/2022   Brief hospital course:   Progress Note     Patient: Katherine Carter:[id number] DOB: 1951/05/18 DOA: 09/21/2022     12 DOS: the patient was seen and examined on 10/03/2022   Brief hospital course: ICU transfer on 09/26/2022.   Taken from prior notes.   Katherine Carter is a 72 year old female with a past medical history significant for chronic hypoxic respiratory failure requiring 4 L supplemental oxygen due to COPD, diabetes mellitus, hypertension, GERD who presents to [facility] ED on 09/21/2022 due to generalized weakness status post multiple falls, dyspnea, and cough.  Initially admitted in ICU for concern of acute on chronic hypoxic respiratory failure and severe sepsis secondary to multifocal pneumonia, initially requiring BiPAP.  Also found to have AKI and concern of mild DKA.   Patient also had anion gap metabolic acidosis along with hyper kalemia and mild hyponatremia on admission.  Chest x-ray with new patchy right greater than left perihilar opacities and pulmonary vascular congestion.  CT head was without any acute abnormality.  CT cervical spine was without any acute fracture, did show some chronic changes with multilevel spinal canal stenosis and a disc protrusion at C3-C4 level.   CT of chest was negative for PE but did show dense consolidation in the right lung likely representing pneumonia along with emphysematous changes in the lungs.   Respiratory panel was negative.  Blood cultures remain negative.  Urine cultures with less than 10,000 colonies/insignificant growth.  Respiratory viral panel negative.  Strep pneumo and Legionella urinary antigens negative.  MRSA PCR negative.   Patient initially received cefepime and vancomycin and Zithromax, vancomycin was discontinued and she continued with Zithromax and cefepime  since 09/21/2022.   Patient remained on BiPAP until 09/25/2022 and able to wean off to high flow nasal cannula. On 09/24/2022 she developed projectile vomiting and abdominal distention, CT abdomen and pelvis revealed small bowel obstruction and general surgery was consulted.  KUB on 1/22 continued to show SBO but she started having bowel movement and nausea, vomiting and abdominal pain resolved.   CT abdomen with also concern of a 2.5 cm iliac aneurysm with distal thrombosis.  Vascular surgery was consulted as on exam she has [facility] Doppler pulses on the right lower extremity. Per vascular surgery note due to current respiratory status and no immediate danger to limb for critical ischemia, they are recommending outpatient follow-up with a possible right lower extremity angiogram and PCI.   Patient was also started on TPN for nutritional support on 09/25/2022.   Today patient remained  tachycardic and tachypneic, requiring up to 6 L of oxygen with high flow.  CMP this morning shows elevated blood glucose at 570, which has been improved to 205 with intervention.  CBC with improving leukocytosis at 11.8, rest of the labs unremarkable.  Follow-up chest x-ray today with stable right perihilar and upper lobe opacity. KUB with stable small bowel dilatation.  No bowel movement since this morning. Remained n.p.o. and on TPN. Starting her on metoprolol 5 mg every 8 hourly and LR at 75 mL/h as she clinically appears dry. Another message sent to general surgery for their follow-up recommendations.   1/24: Remained mildly tachycardic and tachypneic.  Repeat chest x-ray with right upper lung consolidation and KUB with persistent SBO/ileus.  No free air identified.  Saturating well on baseline oxygen  of 4 L.  General surgery did a contrast CT abdomen which shows small bowel obstruction with transition point in the distal ileum in the right lower quadrant, unchanged from prior.  Stable right common iliac artery  aneurysm.  Still no flatus or BM. Having on and off BiPAP to help with breathing.  Chest x-ray with few basal crackles, stopping IV fluid.  Checking BNP with morning labs Goals of care discussion still pending-palliative care is on board.   1/25: Patient remained mildly tachycardic and tachypneic.  Started passing flatus.  KUB with persistent ileitis/partial SBO.  Not a good surgical candidate, so they will continue conservative management and TPN.  Saturating well on baseline oxygen requirement of 4 L. Will complete the course of antibiotic today.   1/26: Hemodynamically stable, continued to have mild tachycardia.  Gastrografin studies with contrast in colon.  KUB today with persistently dilated small bowel.  Passing flatus and had a small bowel movement yesterday.  Not a good surgical candidate, will continue with conservative management.  She was also started on clear liquid diet today.   1/27: Clinically stable, repeat imaging with persistent SBO.  Surgery would like to continue with conservative management.   1/28: Clinically stable.  Tolerating clear liquid diet.  Repeat imaging with stable SBO and lung capacity.  Remained on TPN.   1/29: Clinically stable.  Passing flatus and started getting bowel movement.  Diet advanced to full liquid with advised to use some supplements.  Slight worsening of liver function as patient is on TPN.  Holding statin.  If continue to tolerate p.o. we will start tapering TPN in a day or so   1/30: Patient remained stable.  Tolerating full liquid diet.  TPN to reduce to half rate today.   1/31: feeling well, tolerating some diet, PT advising HH   Assessment and Plan: * Acute on chronic respiratory failure with hypoxia (HCC) Acute problems.  Currently saturating at baseline oxygen requirement of 4 L Was most likely secondary to pneumonia.  Initially requiring BiPAP  -Continue with supplemental oxygen   Sepsis due to pneumonia [facility]) Patient met sepsis  criteria with leukocytosis, tachycardia and tachypnea on admission.  Most likely secondary to multifocal pneumonia. Strep pneumo and Legionella negative.  Cultures remain negative. Patient was treated with cefepime and Zithromax.  Completed the course of Zithromax and cefepime  Ileus ([place]) Improving Patient developed ileus most likely secondary to acute illness.  No surgery done.   Not a good surgical candidate  Started having bowel movement and passing flatus.  Currently tolerating soft diet -Surgery recommends reduced rate of tpn, advancing diet, likely home in the next 24-48 hours if continues to tolerate    Aneurysm of iliac artery [facility]) Vascular surgery was consulted when 2.5 cm aneurysm of iliac artery was noted on CT abdomen.  Decreased right lower extremity pulses but patient is not with critical limb ischemia so they are recommending outpatient follow-up and a possible right lower extremity angiography and PCI. -Restarted home aspirin -Continue Lipitor  Uncontrolled type 2 diabetes mellitus with hyperglycemia, without long-term current use of insulin (HCC) -Continue Levemir at 21 units daily -Continue with SSI  Protein-calorie malnutrition, severe Estimated body mass index is 25.93 kg/m as calculated from the following:   Height as of this encounter: 5\' 3"  (1.6 m).   Weight as of this encounter: 66.4 kg.  -Patient is currently on TPN for nutritional support. -Continue TPN as above  HTN (hypertension) Blood pressure currently within goal. -Continue  amlodipine -We will add back losartan if needed      Subjective: reclining in bed says breathing is at baseline no abd pain tolerating some diet  Physical Exam: Vitals:   10/03/22 2012 10/04/22 0450 10/04/22 0840 10/04/22 0907  BP: 120/80 119/77 104/68   Pulse: 80 70 92   Resp: 18 18 17    Temp: 98.6 F (37 C) 98.7 F (37.1 C) 98.6 F (37 C)   TempSrc:      SpO2: 94% 100% 100% 99%  Weight:      Height:        General.     In no acute distress. Pulmonary.  Lungs clear bilaterally, normal respiratory effort. CV.  Regular rate and rhythm, no JVD, rub or murmur. Abdomen.  Soft, nontender, nondistended, BS positive. CNS.  Alert and oriented .  No focal neurologic deficit. Extremities.  No edema, no cyanosis, pulses intact and symmetrical. Psychiatry.  Judgment and insight appears normal.    Data Reviewed: Prior data reviewed  Family Communication:   Disposition: Status is: Inpatient Remains inpatient appropriate because: Severity of illness  Planned Discharge Destination: Home with Home Health  DVT prophylaxis.  Lovenox Time spent: 35 minutes   Author: Desma Maxim, MD 10/04/2022 9:48 AM  For on call review [email or web address].

## 2022-10-04 NOTE — Progress Notes (Signed)
Occupational Therapy Treatment Patient Details Name: Katherine Carter MRN: [id number] DOB: Feb 27, 1951 Today's Date: 10/04/2022   History of present illness Pt admitted for Acute on chronic respiratory failure with hypoxia along with sepsis. Pt with complaints of weakness, falls, and cough. HIstory includes COPD on 4L of O2 at baseline, DM, HTN, and GERD.   OT comments  Katherine Carter was seen for OT treatment on this date. Upon arrival to room pt reclined in bed, agreeable to tx. Pt requires MIN A sup>sit. CGA + RW bed>chair transfer. Tolerates limited seated therex as described below. Pt making progress toward goals, will continue to follow POC. Discharge recommendation remains appropriate however pt may safely d/c home with assist for all mobility.     Recommendations for follow up therapy are one component of a multi-disciplinary discharge planning process, led by the attending physician.  Recommendations may be updated based on patient status, additional functional criteria and insurance authorization.    Follow Up Recommendations  Skilled nursing-short term rehab (<3 hours/day)     Assistance Recommended at Discharge Frequent or constant Supervision/Assistance  Patient can return home with the following  Assistance with cooking/housework;Assist for transportation;Help with stairs or ramp for entrance;A lot of help with walking and/or transfers;A lot of help with bathing/dressing/bathroom   Equipment Recommendations  BSC/3in1    Recommendations for Other Services      Precautions / Restrictions Precautions Precautions: Fall Restrictions Weight Bearing Restrictions: No       Mobility Bed Mobility Overal bed mobility: Needs Assistance Bed Mobility: Supine to Sit     Supine to sit: Min assist          Transfers Overall transfer level: Needs assistance Equipment used: Rolling walker (2 wheels) Transfers: Sit to/from Stand Sit to Stand: Min guard                  Balance Overall balance assessment: Needs assistance Sitting-balance support: Feet supported Sitting balance-Leahy Scale: Good     Standing balance support: Single extremity supported, During functional activity Standing balance-Leahy Scale: Fair                             ADL either performed or assessed with clinical judgement   ADL Overall ADL's : Needs assistance/impaired                                       General ADL Comments: CGA + RW simulated BSC t/f. SETUP seated grooming tasks      Cognition Arousal/Alertness: Awake/alert Behavior During Therapy: WFL for tasks assessed/performed Overall Cognitive Status: Within Functional Limits for tasks assessed                                          Exercises General Exercises - Lower Extremity Gluteal Sets: AROM, Strengthening, Both, 5 reps, Seated Long Arc Quad: AROM, Strengthening, Both, 5 reps, Seated Hip ABduction/ADduction: AROM, Strengthening, Both, 5 reps, Seated Straight Leg Raises: AROM, Strengthening, Both, 5 reps, Seated Hip Flexion/Marching: AROM, Strengthening, Both, 5 reps, Seated Other Exercises Other Exercises: educated on IS and flutter valve            Pertinent Vitals/ Pain       Pain Assessment Pain Assessment: No/denies pain  Frequency  Min 2X/week        Progress Toward Goals  OT Goals(current goals can now be found in the care plan section)  Progress towards OT goals: Progressing toward goals  Acute Rehab OT Goals Patient Stated Goal: go home OT Goal Formulation: With patient Time For Goal Achievement: 10/07/22 Potential to Achieve Goals: Good ADL Goals Pt Will Perform Grooming: with modified independence;standing Pt Will Perform Lower Body Dressing: with modified independence;sit to/from stand Pt Will Transfer to Toilet: with modified independence;ambulating Pt Will Perform Toileting - Clothing Manipulation and hygiene:  with modified independence;sit to/from stand Additional ADL Goal #1: Pt will provide teach back of 2-3 Energy conservation techniques for improved activity tolerance in ADL/IADL tasks  Plan Discharge plan remains appropriate;Frequency remains appropriate    Co-evaluation                 AM-PAC OT "6 Clicks" Daily Activity     Outcome Measure   Help from another person eating meals?: None Help from another person taking care of personal grooming?: A Little Help from another person toileting, which includes using toliet, bedpan, or urinal?: A Lot Help from another person bathing (including washing, rinsing, drying)?: A Lot Help from another person to put on and taking off regular upper body clothing?: A Little Help from another person to put on and taking off regular lower body clothing?: A Lot 6 Click Score: 16    End of Session Equipment Utilized During Treatment: Rolling walker (2 wheels);Oxygen  OT Visit Diagnosis: Unsteadiness on feet (R26.81);Muscle weakness (generalized) (M62.81);History of falling (Z91.81)   Activity Tolerance Patient tolerated treatment well   Patient Left in chair;with call bell/phone within reach;with chair alarm set   Nurse Communication          Time: 1100-1108 OT Time Calculation (min): 8 min  Charges: OT General Charges $OT Visit: 1 Visit OT Treatments $Therapeutic Exercise: 8-22 mins  Dessie Coma, M.S. OTR/L  10/04/22, 11:59 AM  ascom [phone]

## 2022-10-04 NOTE — Progress Notes (Signed)
[facility] SURGICAL PROGRESS NOTE (cpt [phone])  Hospital Day(s): 13.   Interval History: Patient seen and examined, no acute events or new complaints overnight. Patient reports she is doing well. No abdominal pain. Distension improved. No fever, chills, nausea, emesis. No new labs nor imaging this AM. Continued on FLD; tolerated better yesterday. Passing flatus.   Review of Systems:  Constitutional: denies fever, chills  HEENT: denies cough or congestion  Respiratory: denies any shortness of breath; + wheezing Cardiovascular: denies chest pain or palpitations  Gastrointestinal: + distension (improved), denied abdominal pain, nausea, emesis Genitourinary: denies burning with urination or urinary frequency Musculoskeletal: denies pain, decreased motor or sensation  Vital signs in last 24 hours: [min-max] current  Temp:  [97.9 F (36.6 C)-98.9 F (37.2 C)] 98.7 F (37.1 C) (01/31 0450) Pulse Rate:  [70-81] 70 (01/31 0450) Resp:  [16-18] 18 (01/31 0450) BP: (116-120)/(65-80) 119/77 (01/31 0450) SpO2:  [94 %-100 %] 100 % (01/31 0450)     Height: 5\' 3"  (160 cm) Weight: 68.1 kg BMI (Calculated): 26.6   Intake/Output last 2 shifts:  01/30 0701 - 01/31 0700 In: 983.4 [I.V.:983.4] Out: 700 [Urine:700]   Physical Exam:  Constitutional: alert, cooperative and no distress  HENT: normocephalic without obvious abnormality  Eyes: PERRL, EOM's grossly intact and symmetric  Respiratory: breathing non-labored at rest; undergoing breathing treatment this AM Cardiovascular: tachycardic and sinus rhythm  Gastrointestinal: soft, non-tender, distension is improved this AM, no rebound/guarding. Known umbilical hernia, soft  Musculoskeletal: no edema or wounds, motor and sensation grossly intact, NT    Labs:     Latest Ref Rng & Units 09/29/2022    5:30 AM 09/28/2022    2:48 AM 09/27/2022    5:23 AM  CBC  WBC 4.0 - 10.5 K/uL 13.9  11.9  12.2   Hemoglobin 12.0 - 15.0 g/dL 10.2   11.8  11.3   Hematocrit 36.0 - 46.0 % 33.4  38.0  37.1   Platelets 150 - 400 K/uL 277  297  324       Latest Ref Rng & Units 10/02/2022    5:23 AM 10/01/2022    5:33 AM 09/30/2022    5:15 AM  CMP  Glucose 70 - 99 mg/dL 186  178  172   BUN 8 - 23 mg/dL 27  27  24    Creatinine 0.44 - 1.00 mg/dL 0.75  0.65  0.65   Sodium 135 - 145 mmol/L 136  134  136   Potassium 3.5 - 5.1 mmol/L 4.5  4.2  3.7   Chloride 98 - 111 mmol/L 102  106  104   CO2 22 - 32 mmol/L 27  26  26    Calcium 8.9 - 10.3 mg/dL 9.2  9.1  9.5   Total Protein 6.5 - 8.1 g/dL 7.6     Total Bilirubin 0.3 - 1.2 mg/dL 0.4     Alkaline Phos 38 - 126 U/L 132     AST 15 - 41 U/L 80     ALT 0 - 44 U/L 98        Imaging studies: No new pertinent imaging studies   Assessment/Plan: (ICD-10's: K35.7) 72 y.o. female with likely ileus secondary to acute medical issues, SBO also possible, complicated by exacerbated COPD and PNA               - Trial soft diet cautiously + nutritional supplementation. She did report nausea yesterday with grits; potentially advance tomorrow pending condition - Continue  TPN to 1/2 rate for now; we can stop this once certain tolerating diet              - Monitor abdominal examination; on-going bowel function   - +/- serial KUBs as needed              - Pain control prn; limit narcotics - Antiemetics prn - Mobilize; therapies on board; recommending SNF             - Further management per primary service   - Discharge Planning; Trial of soft diet today; on 1/2 rate TPN so can potentially stop once w know she can tolerate soft diet without N/V. Potentially ready for DC in next 24-48 hours.    All of the above findings and recommendations were discussed with the patient, and the medical team, and all of patient's questions were answered to her expressed satisfaction.  -- Edison Simon, PA-C [facility] 10/04/2022, 7:40 AM M-F: 7am - 4pm

## 2022-10-04 NOTE — TOC Progression Note (Addendum)
Transition of Care [facility]) - Progression Note    Patient Details  Name: Grettell Ransdell MRN: [id number] Date of Birth: August 25, 1951  Transition of Care [facility]) CM/SW [place], LCSW Phone Number: 10/04/2022, 10:00 AM  Clinical Narrative:   [facility] has accepted home health referral for PT and OT but cannot start until 12/5. Patient is aware and agreeable.   10:20 am: Received call from husband. Provided update. He confirmed she does not have a RW. CSW will order closer to discharge. He asked about how to get handicapped sticker for car. Encouraged him to reach out to patient's PCP.  Expected Discharge Plan: [place] Barriers to Discharge: Continued Medical Work up  Expected Discharge Plan and Services   Discharge Planning Services: CM Consult Post Acute Care Choice: [place] arrangements for the past 2 months: Single Family Home                 DME Arranged: N/A DME Agency: NA                   Social Determinants of Health (SDOH) Interventions SDOH Screenings   Food Insecurity: No Food Insecurity (09/23/2022)  Housing: Low Risk  (09/23/2022)  Transportation Needs: No Transportation Needs (09/23/2022)  Utilities: Not At Risk (09/23/2022)  Tobacco Use: Medium Risk (09/23/2022)    Readmission Risk Interventions     No data to display

## 2022-10-04 NOTE — Progress Notes (Signed)
Nutrition Follow Up Note   DOCUMENTATION CODES:   Severe malnutrition in context of chronic illness  INTERVENTION:   Continue TPN per pharmacy- currently at half rate   Ensure Enlive po TID, each supplement provides 350 kcal and 20 grams of protein.  MVI po daily  Daily weights   NUTRITION DIAGNOSIS:   Severe Malnutrition related to chronic illness (COPD) as evidenced by severe fat depletion, severe muscle depletion, 10 percent weight loss in 4 months. -ongoing   GOAL:   Patient will meet greater than or equal to 90% of their needs -met with TPN  MONITOR:   PO intake, Supplement acceptance, Labs, Diet advancement, Weight trends, Skin, I & O's, TPN  ASSESSMENT:   72 y/o female with h/o HTN, COPD, DM and GERD who is admitted with DKA, sepsis, CAP, AKI and SBO.  Pt continues to tolerate TPN well at 1/2 goal rate. No new labs checked. Pt is having BMs and reports passing flatus. Pt advanced to a soft diet today. Pt is eating ~25% of meals in hospital. Pt is refusing Glucerna supplements; RD will change to Ensure. Recommend continue TPN until pt is able to eat at least 50% of a soft diet. Per chart, pt is down ~3lbs since admission.   Medications reviewed and include: aspirin, lovenox, insulin, MVI, reglan  Labs reviewed: K 4.5 wnl, BUN 27(H), P 3.0 wnl, Mg 1.9 wnl- 1/29 Triglycerides- 106- 1/29 Cbgs- 274, 182, 188, 202 x 24 hrs   Diet Order:   Diet Order             DIET SOFT Room service appropriate? Yes; Fluid consistency: Thin  Diet effective now                  EDUCATION NEEDS:   Education needs have been addressed  Skin:  Skin Assessment: Reviewed RN Assessment  Last BM:  1/29  Height:   Ht Readings from Last 1 Encounters:  09/21/22 5\' 3"  (1.6 m)    Weight:   Wt Readings from Last 1 Encounters:  10/03/22 68.1 kg    Ideal Body Weight:  52 kg  BMI:  Body mass index is 26.59 kg/m.  Estimated Nutritional Needs:   Kcal:   1600-1800kcal/day  Protein:  80-90g/day  Fluid:  1.4-1.6L/day  Koleen Distance MS, RD, LDN Please refer to [facility] for RD and/or RD on-call/weekend/after hours pager

## 2022-10-04 NOTE — Consult Note (Signed)
PHARMACY - TOTAL PARENTERAL NUTRITION CONSULT NOTE   Indication: Small bowel obstruction  Patient Measurements: Height: 5\' 3"  (160 cm) Weight: 68.1 kg (150 lb 2.1 oz) IBW/kg (Calculated) : 52.4 TPN AdjBW (KG): 56.7 Body mass index is 26.59 kg/m. Usual Weight: 70-72 kg  Assessment:  72 y.o. female history of COPD admitted for acute on chronic respiratory failure and sepsis likely from multifocal pneumonia.  Was found to have acute kidney injury.  She was transferred to the ICU last couple of days and yesterday started having some abdominal discomfort and this morning having more abdominal pain.  The pain was diffuse intermittent moderate intensity.  There was a CT scan that was obtained that have personally reviewed showing evidence of dilated loops of bowel but no evidence of pneumatosis or closed-loop obstruction.Patient continues to have respiratory issues.  She denies any abdominal pain.  She did have a bowel movement and is passing flatus. Pharmacy has been consulted to initiate TPN as patient has not eaten in~5 days  Glucose / Insulin: BG 137 - 294   insulin last 24h: 33 units SSI  (+ 21 units Levemir q24h (0800) and 55 units reg insulin in TPN) -on steroids-methylprednisolone 20mg  inj daily -pt on jardiance, metformin, ozempic PTA  Electrolytes: Kno electrolyte results today Renal: SCR<1, stable Hepatic: AST/ALT elevated, Tbili 0.4 Intake / Output; MIVF: net (-1.3L), no IVF currently ordered GI Imaging: 1/21 CT of Abdomen: Examination is positive for small bowel obstruction with transition to diminished caliber distal small bowel within the right lower quadrant of the abdomen at the level of distal ileum. GI Surgeries / Procedures:  No current surgical intervention planned  Central access: Patient with PICC already placed prior to admission TPN start date: 09/25/22  Nutritional Goals: Goal TPN rate is 60 mL/hr (provides 86.4 g of protein and 1685 kcals per day)  RD  Assessment: Kcal goal: 1600-1800 kcals/day Protein: 80-90 g/day Fluids: 1.4-1.6 L/day Estimated Needs Total Energy Estimated Needs: 1600-1800kcal/day Total Protein Estimated Needs: 80-90g/day Total Fluid Estimated Needs: 1.4-1.6L/day  Current Nutrition: FLD  Plan:  continue TPN at 90mL/hr  On full liquid diet + nutritional supplementation Electrolytes in TPN: Na 32mEq/L, K 47mEq/L, Ca 94mEq/L, Mg 53mEq/L, and Phos 75mmol/L. Cl:Ac 1:1 Add standard MVI and trace elements to TPN (thiamine completed) Continue Resistant q4h SSI and  maintain  insulin at 30 units (remains on insulin detemir 21 units daily) Monitor TPN labs on Mon/Thurs, daily until stable  Vallery Sa, PharmD, BCPS 10/04/2022 7:10 AM

## 2022-10-04 NOTE — Progress Notes (Signed)
Physical Therapy Treatment Patient Details Name: Katherine Carter MRN: [id number] DOB: [birth year] Today's Date: 10/04/2022   History of Present Illness Pt admitted for Acute on chronic respiratory failure with hypoxia along with sepsis. Pt with complaints of weakness, falls, and cough. HIstory includes COPD on 4L of O2 at baseline, DM, HTN, and GERD.    PT Comments    Patient received in recliner, she is agreeable to PT session. Nasal canula is on top of head on arrival with sats at 98%. Unsure how long it had been off. Patient stands with mod I. Cues given to push up from chair but patient continues to pull up on walker. She is able to do this without assist. Patient ambulated 20x 2 with seated rest between reps with supervision. No lob, but patient fatigued. O2 sats remained > 96% on room air throughout session. Patient will continue to benefit from skilled PT to improve strength and activity tolerance for safe return home. Discharge plan updated to home with [facility].       Recommendations for follow up therapy are one component of a multi-disciplinary discharge planning process, led by the attending physician.  Recommendations may be updated based on patient status, additional functional criteria and insurance authorization.  Follow Up Recommendations  Home health PT     Assistance Recommended at Discharge Intermittent Supervision/Assistance  Patient can return home with the following A little help with walking and/or transfers;A little help with bathing/dressing/bathroom;Assist for transportation;Help with stairs or ramp for entrance;Assistance with cooking/housework   Equipment Recommendations  Rolling walker (2 wheels)    Recommendations for Other Services       Precautions / Restrictions Precautions Precautions: Fall Restrictions Weight Bearing Restrictions: No     Mobility  Bed Mobility               General bed mobility comments: in chair before and after     Transfers Overall transfer level: Modified independent Equipment used: Rolling walker (2 wheels) Transfers: Sit to/from Stand Sit to Stand: Modified independent (Device/Increase time)           General transfer comment: Cues for hand placement, but does not adhere to cues    Ambulation/Gait Ambulation/Gait assistance: Supervision Gait Distance (Feet): 40 Feet (20 x 2) Assistive device: Rolling walker (2 wheels) Gait Pattern/deviations: Step-through pattern, Decreased step length - right, Decreased step length - left, Decreased stride length Gait velocity: decr     General Gait Details: Patient ambulated well with supervision and RW. No lob, fatigued after 20 feet requiring seated rest break . Patient ambulated on room air with sats 96% or greather throughout session.   Stairs             Wheelchair Mobility    Modified Rankin (Stroke Patients Only)       Balance Overall balance assessment: Modified Independent Sitting-balance support: Feet supported Sitting balance-Leahy Scale: Normal     Standing balance support: Bilateral upper extremity supported, During functional activity, Reliant on assistive device for balance Standing balance-Leahy Scale: Good Standing balance comment: no physical assist needed for ambulation or standing balance                            Cognition Arousal/Alertness: Awake/alert Behavior During Therapy: WFL for tasks assessed/performed Overall Cognitive Status: Within Functional Limits for tasks assessed  Exercises      General Comments        Pertinent Vitals/Pain Pain Assessment Pain Assessment: No/denies pain    Home Living                          Prior Function            PT Goals (current goals can now be found in the care plan section) Acute Rehab PT Goals Patient Stated Goal: to get her strength back, go home PT Goal  Formulation: With patient Time For Goal Achievement: 10/07/22 Potential to Achieve Goals: Good Additional Goals Additional Goal #1: Pt will be able to perform bed mobility/transfers with mod I and safe technique in order to improve functional independence Progress towards PT goals: Progressing toward goals    Frequency    Min 2X/week      PT Plan Discharge plan needs to be updated    Co-evaluation              AM-PAC PT "6 Clicks" Mobility   Outcome Measure  Help needed turning from your back to your side while in a flat bed without using bedrails?: None Help needed moving from lying on your back to sitting on the side of a flat bed without using bedrails?: A Little Help needed moving to and from a bed to a chair (including a wheelchair)?: A Little Help needed standing up from a chair using your arms (e.g., wheelchair or bedside chair)?: A Little Help needed to walk in hospital room?: A Little Help needed climbing 3-5 steps with a railing? : A Lot 6 Click Score: 18    End of Session Equipment Utilized During Treatment: Gait belt;Oxygen Activity Tolerance: Patient tolerated treatment well;Patient limited by fatigue Patient left: in chair;with call bell/phone within reach Nurse Communication: Mobility status PT Visit Diagnosis: Muscle weakness (generalized) (M62.81);Difficulty in walking, not elsewhere classified (R26.2)     Time: 9741-6384 PT Time Calculation (min) (ACUTE ONLY): 19 min  Charges:  $Gait Training: 8-22 mins                     Cailyn Houdek, PT, GCS 10/04/22,9:16 AM

## 2022-10-05 DIAGNOSIS — J9621 Acute and chronic respiratory failure with hypoxia: Secondary | ICD-10-CM | POA: Diagnosis not present

## 2022-10-05 LAB — COMPREHENSIVE METABOLIC PANEL
ALT: 200 U/L — ABNORMAL HIGH (ref 0–44)
AST: 105 U/L — ABNORMAL HIGH (ref 15–41)
Albumin: 2.5 g/dL — ABNORMAL LOW (ref 3.5–5.0)
Alkaline Phosphatase: 197 U/L — ABNORMAL HIGH (ref 38–126)
Anion gap: 11 (ref 5–15)
BUN: 29 mg/dL — ABNORMAL HIGH (ref 8–23)
CO2: 23 mmol/L (ref 22–32)
Calcium: 9.4 mg/dL (ref 8.9–10.3)
Chloride: 100 mmol/L (ref 98–111)
Creatinine, Ser: 0.71 mg/dL (ref 0.44–1.00)
GFR, Estimated: >60 mL/min (ref >60)
Glucose, Bld: 119 mg/dL — ABNORMAL HIGH (ref 70–99)
Potassium: 5 mmol/L (ref 3.5–5.1)
Sodium: 134 mmol/L — ABNORMAL LOW (ref 135–145)
Total Bilirubin: 0.6 mg/dL (ref 0.3–1.2)
Total Protein: 8.1 g/dL (ref 6.5–8.1)

## 2022-10-05 LAB — TRIGLYCERIDES: Triglycerides: 128 mg/dL (ref <150)

## 2022-10-05 LAB — GLUCOSE, CAPILLARY
Glucose-Capillary: 126 mg/dL — ABNORMAL HIGH (ref 70–99)
Glucose-Capillary: 122 mg/dL — ABNORMAL HIGH (ref 70–99)
Glucose-Capillary: 147 mg/dL — ABNORMAL HIGH (ref 70–99)
Glucose-Capillary: 185 mg/dL — ABNORMAL HIGH (ref 70–99)

## 2022-10-05 LAB — MAGNESIUM: Magnesium: 1.7 mg/dL (ref 1.7–2.4)

## 2022-10-05 LAB — PHOSPHORUS: Phosphorus: 3.9 mg/dL (ref 2.5–4.6)

## 2022-10-05 NOTE — Progress Notes (Signed)
Per Mr. Alles, will leave work to run home and retrieve portable O2. Plan to be here around 3:00 or 4:00 pm.   Fuller Mandril, RN

## 2022-10-05 NOTE — Care Management Important Message (Signed)
Important Message  Patient Details  Name: Katherine Carter MRN: [id number] Date of Birth: [birth year]   Medicare Important Message Given:  Yes     [name] 10/05/2022, 2:49 PM

## 2022-10-05 NOTE — Discharge Summary (Signed)
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[place])   Uncontrolled type 2 diabetes mellitus with hyperglycemia, without long-term current use of insulin ([facility])   Protein-calorie malnutrition, severe   HTN (hypertension)   Discharge Condition: improved  Diet recommendation: heart healthy  Filed Weights   10/02/22 1157 10/03/22 0455 10/05/22 0500  Weight: 68.9 kg 68.1 kg 69.2 kg    History of present illness:  From admission h and p Katherine Carter is a 72 year old female with a past medical history significant for chronic hypoxic respiratory failure requiring 4 L supplemental oxygen due to COPD, diabetes mellitus, hypertension, GERD who presents to [facility] ED on 09/21/2022 due to generalized weakness status post multiple falls, dyspnea, and cough.   Patient is currently on BiPAP with increased work of breathing, therefore history is obtained from the patient's husband at bedside along with chart review.  Her husband reports that last Saturday she began to have cough and shortness of breath, which have worsened since then.  She is also had generalized weakness status post 2 falls, poor p.o. intake, and dysuria.  He denies any knowledge that she has had any chest pain, dizziness, abdominal pain, nausea, vomiting, diarrhea, fever, chills.    Hospital Course:  Hx COPD on 4 L o2 at baseline, DM, HTN, presenting with cough. Admitted to the ICU with multifocal pneumonia and acute hypoxic respiratory failure requiring bipap. Also with AKI and sepsis. No PE on CTA of  chest. Blood cultures and respiratory viral testing negative. Treated with cefepime/vancomycin/azithromycin and finished a course of cefepime and azithromycin. Weaned off bipap after a couple of days and now breathing comfortably on home 4 L O2. Hospital course complicated by SBO thought to be secondary to ileus. Gen surg consulted. Did not require NT tube but was treated with bowel rest and TPN. Diet was slowly advanced and she is now tolerating it. CT of abdomen incidentally showed 2.5 cm iliac aneurysm. Vascular surgery consulted, advised outpatient f/u, referral placed. BP low normal at discharge will hold home losartan for now. LFTs mildly elevated, no current abd pain or nausea/vomitng, CT scan earlier this hospitalization showed no signs underlying liver disease, think this is likely 2/2 TPN, advise repeating LFTs at outpatient PCP f/u. Will hold home atorvastatin pending that. Evaluated by PT, advised home health PT/OT and rolling walker which we have ordered.  Procedures: none  Consultations: pccm  Discharge Exam: Vitals:   10/05/22 0450 10/05/22 0849  BP: (!) 92/59 107/67  Pulse: 95 100  Resp:  18  Temp: 97.7 F (36.5 C) 97.9 F (36.6 C)  SpO2: 96% 100%    General.     In no acute distress. Pulmonary.  Lungs clear bilaterally, normal respiratory effort. CV.  Regular rate and rhythm, no JVD, rub or murmur. Abdomen.  Soft, nontender, nondistended, BS positive. CNS.  Alert and oriented .  No focal neurologic deficit. Extremities.  No edema, no cyanosis, pulses intact and symmetrical. Psychiatry.  Judgment and insight appears normal.    Discharge Instructions   Discharge Instructions  Ambulatory referral to Vascular Surgery   Complete by: As directed    Diet - low sodium heart healthy   Complete by: As directed    Increase activity slowly   Complete by: As directed       Allergies as of 10/05/2022       Reactions   Penicillins Hives, Other (See Comments)   Has  patient had a PCN reaction causing immediate rash, facial/tongue/throat swelling, SOB or lightheadedness with hypotension: No Has patient had a PCN reaction causing severe rash involving mucus membranes or skin necrosis: No Has patient had a PCN reaction that required hospitalization: No Has patient had a PCN reaction occurring within the last 10 years: No If all of the above answers are "NO", then may proceed with Cephalosporin use.        Medication List     STOP taking these medications    atorvastatin 40 MG tablet Commonly known as: LIPITOR   celecoxib 100 MG capsule Commonly known as: CELEBREX   losartan 100 MG tablet Commonly known as: COZAAR   MiraLax 17 g packet Generic drug: polyethylene glycol   Ozempic (0.25 or 0.5 MG/DOSE) 2 MG/3ML Sopn Generic drug: Semaglutide(0.25 or 0.5MG /DOS)   pantoprazole 40 MG tablet Commonly known as: PROTONIX   Spiriva HandiHaler 18 MCG inhalation capsule Generic drug: tiotropium   Trulicity [id number] Sopn Generic drug: Dulaglutide       TAKE these medications    Adult Mask Large Misc See admin instructions. use with inhaler   AeroChamber Plus inhaler Use with inhaler   albuterol 108 (90 Base) MCG/ACT inhaler Commonly known as: VENTOLIN HFA Inhale 2 puffs into the lungs every 4 (four) hours as needed for wheezing or shortness of breath.   amLODipine 5 MG tablet Commonly known as: NORVASC Take 1 tablet (5 mg total) by mouth daily.   aspirin EC 81 MG tablet Take 81 mg by mouth daily.   benzonatate 100 MG capsule Commonly known as: TESSALON Take 2 capsules (200 mg total) by mouth every 8 (eight) hours.   budesonide-formoterol 160-4.5 MCG/ACT inhaler Commonly known as: SYMBICORT Inhale 2 puffs into the lungs 2 (two) times daily.   fluticasone 50 MCG/ACT nasal spray Commonly known as: FLONASE Place 1 spray into both nostrils daily.   guaiFENesin-codeine 100-10 MG/5ML syrup Commonly known as: ROBITUSSIN  AC Take 5 mLs by mouth 3 (three) times daily as needed for cough.   ipratropium 0.06 % nasal spray Commonly known as: ATROVENT Place 2 sprays into both nostrils 4 (four) times daily.   ipratropium-albuterol 0.5-2.5 (3) MG/3ML Soln Commonly known as: DUONEB Take 3 mLs by nebulization every 4 (four) hours as needed. What changed: additional instructions   Jardiance 10 MG Tabs tablet Generic drug: empagliflozin Take 10 mg by mouth daily.   latanoprost 0.005 % ophthalmic solution Commonly known as: XALATAN Place 1 drop into both eyes 2 (two) times daily.   metFORMIN 500 MG 24 hr tablet Commonly known as: GLUCOPHAGE-XR Take 500 mg by mouth daily with breakfast.   methocarbamol 500 MG tablet Commonly known as: ROBAXIN Take 500 mg by mouth every 6 (six) hours as needed for muscle spasms.   Mucinex DM 30-600 MG Tb12 SMARTSIG:1-2 Tablet(s) By Mouth Every 12 Hours PRN   ondansetron 4 MG disintegrating tablet Commonly known as: ZOFRAN-ODT Take 1 tablet (4 mg total) by mouth every 8 (eight) hours as needed.   ondansetron 4 MG tablet Commonly known as: ZOFRAN Take 4 mg by mouth every  8 (eight) hours as needed for nausea or vomiting.   Victoza 18 MG/3ML Sopn Generic drug: liraglutide Inject 0.6 mg into the skin daily.   Voltaren Arthritis Pain 1 % Gel Generic drug: diclofenac Sodium Apply 2 g topically 4 (four) times daily.   Xopenex HFA 45 MCG/ACT inhaler Generic drug: levalbuterol Inhale 1 puff into the lungs 3 (three) times daily.               Durable Medical Equipment  (From admission, onward)           Start     Ordered   10/05/22 1013  For home use only DME Walker rolling  Once       Question Answer Comment  Walker: With [place]   Patient needs a walker to treat with the following condition Poor balance      10/05/22 1012   10/05/22 1009  DME Walker  Once       Question Answer Comment  Walker: With [place]   Patient needs a walker to  treat with the following condition Gait disorder      10/05/22 1009           Allergies  Allergen Reactions   Penicillins Hives and Other (See Comments)    Has patient had a PCN reaction causing immediate rash, facial/tongue/throat swelling, SOB or lightheadedness with hypotension: No Has patient had a PCN reaction causing severe rash involving mucus membranes or skin necrosis: No Has patient had a PCN reaction that required hospitalization: No Has patient had a PCN reaction occurring within the last 10 years: No If all of the above answers are "NO", then may proceed with Cephalosporin use.    Follow-up Information     Aycock, Edmonia Lynch, MD. Go to.   Specialty: Family Medicine Why: 10/12/22 2:20 PM Contact information: [place] [address] [phone]                  The results of significant diagnostics from this hospitalization (including imaging, microbiology, ancillary and laboratory) are listed below for reference.    Significant Diagnostic Studies: DG ABD ACUTE 2+V W 1V CHEST  Result Date: 09/30/2022 CLINICAL DATA:  Ileus. EXAM: DG ABDOMEN ACUTE WITH 1 VIEW CHEST COMPARISON:  September 29, 2022. FINDINGS: Stable right upper lobe airspace opacity is noted. Right-sided PICC line is noted with tip in expected position of the SVC. Normal cardiomediastinal silhouette. Stable small bowel dilatation is noted. Stool and contrast is noted in nondilated colon. Status post cholecystectomy. IMPRESSION: Stable small bowel dilatation is noted concerning for ileus or distal small bowel obstruction. Stable right upper lobe airspace opacity is noted. Electronically Signed   By: Marijo Conception M.D.   On: 09/30/2022 11:53   DG Abd Portable 2V  Result Date: 09/29/2022 CLINICAL DATA:  Ileus, abdominal distention EXAM: PORTABLE ABDOMEN - 2 VIEW COMPARISON:  Multiple exams, including 09/28/2022 FINDINGS: Continued substantial right mid lung/right upper lobe  airspace opacity. Atherosclerotic calcification of the aortic arch. Heart size within normal limits. There is contrast medium in the colon. Multiple dilated loops of small bowel up to about 4.6 cm in diameter, similar to yesterday's examination. The upright view includes only the upper abdomen, which demonstrates the dilated loops of bowel without well-defined air-fluid levels in the upper abdomen. Mild blunting of both costophrenic angles. IMPRESSION: 1. Continued dilated loops of small bowel up to 4.6 cm in diameter, with contrast medium in the  colon. The progression of contrast medium to the colon implies that any small-bowel obstruction in this case is not complete/high-grade. 2. Continued substantial airspace opacity in the right mid lung/right upper lobe. 3. Mild blunting of both costophrenic angles, possibly from small bilateral pleural effusions or pleural thickening. Electronically Signed   By: Gaylyn Rong M.D.   On: 09/29/2022 08:11   DG Abd Portable 2V  Result Date: 09/28/2022 CLINICAL DATA:  SBO EXAM: PORTABLE ABDOMEN - 2 VIEW COMPARISON:  09/27/2022 FINDINGS: Small-bowel dilatation noted similar to the prior examination. Findings consistent with ileus or obstruction. No definite stones. No definite organomegaly. There is stool and air overlying the right colon and rectosigmoid. No free intraperitoneal air identified. IMPRESSION: Small-bowel dilatation consistent with obstruction or ileus with no definite interval change. No free air identified. Electronically Signed   By: Layla Maw M.D.   On: 09/28/2022 08:46   CT ABDOMEN PELVIS W CONTRAST  Result Date: 09/27/2022 CLINICAL DATA:  Bowel obstruction suspected. EXAM: CT ABDOMEN AND PELVIS WITH CONTRAST TECHNIQUE: Multidetector CT imaging of the abdomen and pelvis was performed using the standard protocol following bolus administration of intravenous contrast. RADIATION DOSE REDUCTION: This exam was performed according to the  departmental dose-optimization program which includes automated exposure control, adjustment of the mA and/or kV according to patient size and/or use of iterative reconstruction technique. CONTRAST:  OMNIPAQUE IOHEXOL 300 MG/ML  SOLN COMPARISON:  Abdominal series same day. CT abdomen and pelvis 09/24/2022. FINDINGS: Lower chest: Small amount of atelectasis/airspace disease in the right lower lobe has mildly increased. Hepatobiliary: No focal liver abnormality is seen. Status post cholecystectomy. No biliary dilatation. Pancreas: Previously identified cystic lesion in the tail the pancreas is not well seen on this study. No acute pancreatic abnormality. No ductal dilatation. Spleen: There is a rounded subcentimeter hypodensity within the spleen, possibly a cyst or hemangioma, unchanged. The spleen is normal in size. Adrenals/Urinary Tract: There are subcentimeter rounded cortical cysts in both kidneys. Otherwise, the kidneys, adrenal glands and bladder are within normal limits. Stomach/Bowel: Again seen are dilated small bowel loops with air-fluid levels measuring up to 3.8 cm. Transition point is seen in the distal ileum in the right lower quadrant, unchanged. Oral contrast reaches mid small bowel. The colon is nondilated. The appendix is within normal limits. There is sigmoid colon diverticulosis. The stomach is decompressed. There is a small hiatal hernia. There is no pneumatosis, wall thickening, inflammation or free air. There is no significant mesenteric edema. Vascular/Lymphatic: Right common iliac artery aneurysm measuring 2.1 cm appears thrombosed and unchanged. Normal flow is seen distally. There are severe atherosclerotic calcifications of the iliac arteries and aorta. Aorta and IVC are normal in size. There are no enlarged lymph nodes identified. Reproductive: Status post hysterectomy. No adnexal masses. Other: There is a small amount of free fluid in the pelvis, unchanged. Fat containing umbilical  hernia appears unchanged. Musculoskeletal: There are degenerative changes throughout the spine. IMPRESSION: 1. Findings compatible with small-bowel obstruction with transition point in the distal ileum in the right lower quadrant, unchanged from prior. 2. Stable small amount of free fluid in the pelvis. 3. Stable right common iliac artery aneurysm. 4. Small amount of atelectasis/airspace disease in the right lower lobe has mildly increased. Aortic Atherosclerosis (ICD10-I70.0). Electronically Signed   By: Darliss Cheney M.D.   On: 09/27/2022 15:23   DG ABD ACUTE 2+V W 1V CHEST  Result Date: 09/27/2022 CLINICAL DATA:  Ileus EXAM: DG ABDOMEN ACUTE WITH 1 VIEW CHEST  COMPARISON:  09/26/2022 FINDINGS: Right upper hemithorax consolidation. Left upper hemithorax hyperlucent. No pneumothorax or pleural effusion. Calcified aorta. Dilated loops of small bowel. Stool and air overlies the colon. Cholecystectomy clips. No free air identified below the diaphragm. IMPRESSION: Right upper lung consolidation. Hyperlucency suggesting COPD. Dilated small bowel consistent with obstruction or ileus. No free air identified. Electronically Signed   By: Sammie Bench M.D.   On: 09/27/2022 08:06   DG Chest Port 1 View  Result Date: 09/26/2022 CLINICAL DATA:  Acute on chronic respiratory failure with hypoxia and hypercapnia. EXAM: PORTABLE CHEST 1 VIEW COMPARISON:  September 24, 2022.  September 22, 2022. FINDINGS: The heart size and mediastinal contours are within normal limits. Emphysematous disease is noted bilaterally. Stable right perihilar and upper lobe opacity is noted concerning for possible pneumonia. Minimal right basilar subsegmental atelectasis is noted. The visualized skeletal structures are unremarkable. IMPRESSION: Stable right perihilar and upper lobe opacity concerning for possible pneumonia. Minimal right basilar subsegmental atelectasis. Emphysema (ICD10-J43.9). Electronically Signed   By: Marijo Conception M.D.   On:  09/26/2022 08:13   DG Abd 1 View  Result Date: 09/26/2022 CLINICAL DATA:  Ileus. EXAM: ABDOMEN - 1 VIEW COMPARISON:  September 25, 2022. FINDINGS: Status post cholecystectomy. Stable small bowel dilatation is noted concerning for distal small bowel obstruction. No colonic dilatation is noted. IMPRESSION: Stable small bowel dilatation as described above. Electronically Signed   By: Marijo Conception M.D.   On: 09/26/2022 08:11   DG Abd 1 View  Result Date: 09/25/2022 CLINICAL DATA:  Abdominal distension EXAM: ABDOMEN - 1 VIEW COMPARISON:  09/24/2022 FINDINGS: Dilated loops of small bowel are again observed in the central abdomen, measuring up to 4.0 cm in diameter, not substantially changed from 09/24/2022. There is formed stool in the colon. Right upper quadrant clips from prior cholecystectomy. IMPRESSION: 1. Persistent dilated loops of small bowel in the central abdomen measuring up to 4.0 cm in diameter. Appearance in conjunction with prior CT scan favor small-bowel obstruction. Electronically Signed   By: Van Clines M.D.   On: 09/25/2022 08:05   [place] EKG SITE RITE  Result Date: 09/24/2022 If Site Rite image not attached, placement could not be confirmed due to current cardiac rhythm.  CT ABDOMEN PELVIS W CONTRAST  Result Date: 09/24/2022 CLINICAL DATA:  Evaluate for bowel obstruction. EXAM: CT ABDOMEN AND PELVIS WITH CONTRAST TECHNIQUE: Multidetector CT imaging of the abdomen and pelvis was performed using the standard protocol following bolus administration of intravenous contrast. RADIATION DOSE REDUCTION: This exam was performed according to the departmental dose-optimization program which includes automated exposure control, adjustment of the mA and/or kV according to patient size and/or use of iterative reconstruction technique. CONTRAST:  154mL OMNIPAQUE IOHEXOL 300 MG/ML  SOLN COMPARISON:  Plain film radiographs from earlier today. FINDINGS: Lower chest: Mild ground-glass attenuation  noted within the imaged portions of the right upper lobe. Subpleural consolidation and ground-glass attenuation is noted in the posterior right lower lobe. No pleural fluid. Hepatobiliary: No focal liver abnormality is seen. Status post cholecystectomy. No biliary dilatation. Pancreas: Small cystic lesion within tail of pancreas measures 0.7 cm, image 27/2. No main duct dilatation, inflammation or suspicious mass. Spleen: Normal in size without focal abnormality. Adrenals/Urinary Tract: Normal adrenal glands. No nephrolithiasis or hydronephrosis. There is an accessory renal artery arising off the inferior pole of the right kidney. Small bilateral kidney cysts noted. No follow-up imaging recommended. Urinary bladder is unremarkable. Stomach/Bowel: Stomach appears normal. The appendix is  visualized and appears normal. Dilated mid small bowel loops identified with scattered air-fluid levels. These measure up to 3.6 cm in diameter compatible with small bowel obstruction. Transition to diminished caliber distal small bowel noted within the right lower quadrant of the abdomen, image 66/2 and sagittal image 44/6. Normal caliber of the colon. Scat scratch set diffuse colonic diverticulosis without signs of acute diverticulitis. Vascular/Lymphatic: Aortic atherosclerosis. Normal caliber abdominal aorta. There is a right inguinal central venous catheter with tip terminating in the distal right common iliac vein. Just beyond the common iliac bifurcation there is focal aneurysmal dilatation of the right internal iliac artery measures 2.1 cm. This appears nearly completely thrombosed by noncalcified plaque with reconstitution distal to the aneurysm. No signs of abdominopelvic adenopathy. Reproductive: Status post hysterectomy. No adnexal masses. Other: Trace free fluid within the dependent pelvis. No fluid collections. No signs of pneumoperitoneum. Lobulated fat containing umbilical hernia measures 6.2 cm, image 70/6.  Musculoskeletal: No acute or significant osseous findings. Lumbar spondylosis. IMPRESSION: 1. Examination is positive for small bowel obstruction with transition to diminished caliber distal small bowel within the right lower quadrant of the abdomen at the level of distal ileum. 2. Subpleural consolidation and ground-glass attenuation in the posterior right lower lobe. Findings are concerning for pneumonia. 3. 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 This appears nearly completely thrombosed by noncalcified plaque with reconstitution distal to the aneurysm. 4. Small cystic lesion within tail of pancreas measures 0.7 cm. Recommend follow-up imaging in 24 months with pancreas protocol MRI. This recommendation follows ACR consensus guidelines: Managing Incidental Findings on Abdominal CT: White Paper of the ACR Incidental Findings Committee. J Am Coll Radiol 2010;7:754-773. 5. Fat containing umbilical hernia. 6.  Aortic Atherosclerosis (ICD10-I70.0). Electronically Signed   By: Kerby Moors M.D.   On: 09/24/2022 06:32   DG Chest 1 View  Result Date: 09/24/2022 CLINICAL DATA:  Shortness of breath and abdominal distension. EXAM: CHEST  1 VIEW COMPARISON:  09/21/2022 FINDINGS: Stable cardiomediastinal contours. No pleural fluid or interstitial edema. Advanced changes of emphysema. Extensive airspace disease within the perihilar right upper lobe is again noted and is stable to slightly progressive when compared with the previous exam. Left lung remains clear. IMPRESSION: 1. Stable to slightly progressive airspace disease within the perihilar right upper lobe. 2. Advanced emphysema. Electronically Signed   By: Kerby Moors M.D.   On: 09/24/2022 05:24   DG Abd 1 View  Result Date: 09/24/2022 CLINICAL DATA:  Abdominal distension and shortness of breath. EXAM: ABDOMEN - 1 VIEW COMPARISON:  06/11/2021 FINDINGS: Right femoral catheter is identified with tip terminating over the right sacral wing. The bowel gas  pattern is abnormal. Multiple dilated small bowel loops are identified measuring up to 3.9 cm. Gas is noted within the colon up to the level of the rectum. IMPRESSION: Abnormal bowel gas pattern with dilated small bowel loops and gas noted within the colon. Findings concerning for small bowel obstruction versus ileus. Electronically Signed   By: Kerby Moors M.D.   On: 09/24/2022 05:18   CT Angio Chest PE W and/or Wo Contrast  Result Date: 09/22/2022 CLINICAL DATA:  Pulmonary embolus suspected with high probability. Weakness, cough, and falls. Dizziness, chest pain, and shortness of breath. Home oxygen use. EXAM: CT ANGIOGRAPHY CHEST WITH CONTRAST TECHNIQUE: Multidetector CT imaging of the chest was performed using the standard protocol during bolus administration of intravenous contrast. Multiplanar CT image reconstructions and MIPs were obtained to evaluate the vascular anatomy. RADIATION DOSE REDUCTION:  This exam was performed according to the departmental dose-optimization program which includes automated exposure control, adjustment of the mA and/or kV according to patient size and/or use of iterative reconstruction technique. CONTRAST:  103mL OMNIPAQUE IOHEXOL 350 MG/ML SOLN COMPARISON:  07/09/2018 FINDINGS: Cardiovascular: Motion artifact limits examination. There is good opacification of the central and segmental pulmonary arteries. No focal filling defects. No evidence of significant pulmonary embolus. Normal heart size. Minimal pericardial effusion. Normal caliber thoracic aorta. No dissection. Calcification of the aorta and coronary arteries. Mediastinum/Nodes: Mediastinal lymph nodes are not pathologically enlarged, likely reactive. Esophagus is decompressed. Small esophageal hiatal hernia. Thyroid gland is unremarkable. Lungs/Pleura: Emphysematous changes in the lungs. Dense consolidation is demonstrated in the medial right upper lung, the right middle lung, and superior segment of the right lower  lung. Changes are likely to represent pneumonia. Left lung is clear. No pleural effusions. No pneumothorax. Upper Abdomen: Surgical absence of the gallbladder. Nonspecific stranding around the left kidney. No loculated collection or abscess identified. Musculoskeletal: Degenerative changes in the spine. Review of the MIP images confirms the above findings. IMPRESSION: 1. No evidence of significant pulmonary embolus. 2. Dense consolidation in the right lung likely representing pneumonia. 3. Emphysematous changes in the lungs.  Aortic atherosclerosis. Electronically Signed   By: Lucienne Capers M.D.   On: 09/22/2022 20:55   CT Cervical Spine Wo Contrast  Result Date: 09/21/2022 CLINICAL DATA:  Provided history: Neck trauma. Additional history provided: Bilateral leg weakness for one week. Multiple falls. Incontinence. EXAM: CT CERVICAL SPINE WITHOUT CONTRAST TECHNIQUE: Multidetector CT imaging of the cervical spine was performed without intravenous contrast. Multiplanar CT image reconstructions were also generated. RADIATION DOSE REDUCTION: This exam was performed according to the departmental dose-optimization program which includes automated exposure control, adjustment of the mA and/or kV according to patient size and/or use of iterative reconstruction technique. COMPARISON:  No pertinent prior exams available for comparison. FINDINGS: Mildly motion degraded exam. Alignment: 2-3 mm grade 1 anterolisthesis at C4-C5. Skull base and vertebrae: The basion-dental and atlanto-dental intervals are maintained.No evidence of acute fracture to the cervical spine. Soft tissues and spinal canal: No prevertebral fluid or swelling. No visible canal hematoma. Disc levels: Cervical spondylosis with multilevel disc space narrowing, disc bulges/central disc protrusions, uncovertebral hypertrophy and facet arthrosis. Disc space narrowing is greatest at C5-C6 and C6-C7 (moderate to advanced at these levels). Multilevel spinal  canal stenosis. Most notably, a central disc protrusion contributes to at least moderate spinal canal stenosis at C3-C4. Multilevel bony neural foraminal narrowing. Upper chest: Severe emphysema within the imaged lung apices. IMPRESSION: 1. Mildly motion degraded exam. 2. No evidence of acute fracture to the cervical spine. 3. 2-3 mm grade 1 anterolisthesis at C4-C5. 4. Cervical spondylosis, as described. Multilevel spinal canal stenosis. Most notably, a central disc protrusion contributes to at least moderate spinal canal stenosis at C3-C4. Multilevel bony neural foraminal narrowing also present within the cervical spine. A cervical spine MRI may be obtained for further evaluation, as clinically warranted. 5.  Aortic Atherosclerosis (ICD10-I70.0). Electronically Signed   By: Kellie Simmering D.O.   On: 09/21/2022 13:00   CT HEAD WO CONTRAST (5MM)  Result Date: 09/21/2022 CLINICAL DATA:  Multiple falls this week.  Head trauma. EXAM: CT HEAD WITHOUT CONTRAST TECHNIQUE: Contiguous axial images were obtained from the base of the skull through the vertex without intravenous contrast. RADIATION DOSE REDUCTION: This exam was performed according to the departmental dose-optimization program which includes automated exposure control, adjustment of the mA and/or  kV according to patient size and/or use of iterative reconstruction technique. COMPARISON:  None Available. FINDINGS: Brain: There is no evidence for acute hemorrhage, hydrocephalus, mass lesion, or abnormal extra-axial fluid collection. No definite CT evidence for acute infarction. Patchy low attenuation in the deep hemispheric and periventricular white matter is nonspecific, but likely reflects chronic microvascular ischemic demyelination. Calcification of the left tentorium noted on axial 8/6, confirmed on coronal 25/13. Vascular: No hyperdense vessel or unexpected calcification. Skull: No evidence for fracture. No worrisome lytic or sclerotic lesion.  Sinuses/Orbits: The visualized paranasal sinuses and mastoid air cells are clear. Visualized portions of the globes and intraorbital fat are unremarkable. Other: None. IMPRESSION: 1. No acute intracranial abnormality. 2. Chronic small vessel ischemic disease. Electronically Signed   By: Misty Stanley M.D.   On: 09/21/2022 12:52   DG Chest Port 1 View  Result Date: 09/21/2022 CLINICAL DATA:  Questionable sepsis, evaluate for abnormality. Increased weakness, causing falls. Dizziness, chest pain and shortness breath. EXAM: PORTABLE CHEST 1 VIEW COMPARISON:  06/04/2022. FINDINGS: New patchy right greater than left perihilar opacities and pulmonary venous congestion. Stable mild elevation of the left hemidiaphragm. Mild cardiomegaly. Stable mediastinal contours with atherosclerotic calcifications and tortuosity of the aorta. No pleural effusion or pneumothorax. IMPRESSION: 1. New patchy right greater than left perihilar opacities and pulmonary venous congestion. Differential considerations include asymmetric edema versus infection or aspiration. 2.  Aortic Atherosclerosis (ICD10-I70.0). Electronically Signed   By: Emmit Alexanders M.D.   On: 09/21/2022 12:38    Microbiology: No results found for this or any previous visit (from the past 240 hour(s)).   Labs: Basic Metabolic Panel: Recent Labs  Lab 09/30/22 0515 10/01/22 0533 10/02/22 0523 10/05/22 0501  NA 136 134* 136 134*  K 3.7 4.2 4.5 5.0  CL 104 106 102 100  CO2 26 26 27 23   GLUCOSE 172* 178* 186* 119*  BUN 24* 27* 27* 29*  CREATININE 0.65 0.65 0.75 0.71  CALCIUM 9.5 9.1 9.2 9.4  MG 1.6* 1.8 1.9 1.7  PHOS 2.5 2.9 3.0 3.9   Liver Function Tests: Recent Labs  Lab 10/02/22 0523 10/05/22 0501  AST 80* 105*  ALT 98* 200*  ALKPHOS 132* 197*  BILITOT 0.4 0.6  PROT 7.6 8.1  ALBUMIN 2.0* 2.5*   No results for input(s): "LIPASE", "AMYLASE" in the last 168 hours. No results for input(s): "AMMONIA" in the last 168 hours. CBC: Recent  Labs  Lab 09/29/22 0530  WBC 13.9*  HGB 10.2*  HCT 33.4*  MCV 92.0  PLT 277   Cardiac Enzymes: No results for input(s): "CKTOTAL", "CKMB", "CKMBINDEX", "TROPONINI" in the last 168 hours. BNP: BNP (last 3 results) Recent Labs    09/21/22 1432 09/23/22 0506 09/28/22 0248  BNP 110.7* 128.1* 78.2    ProBNP (last 3 results) No results for input(s): "PROBNP" in the last 8760 hours.  CBG: Recent Labs  Lab 10/04/22 1616 10/04/22 2004 10/04/22 2320 10/05/22 0440 10/05/22 0810  GLUCAP 184* 189* 76 126* 122*       Signed:  Desma Maxim MD.  [facility] 10/05/2022, 10:18 AM

## 2022-10-05 NOTE — TOC Transition Note (Signed)
Transition of Care [facility]) - CM/SW Discharge Note   Patient Details  Name: Katherine Carter MRN: [id number] Date of Birth: 1950-09-11  Transition of [facility]) CM/SW Contact:  Candie Chroman, LCSW Phone Number: 10/05/2022, 10:23 AM   Clinical Narrative:  Patient has orders to discharge home today. [place] liaison is aware. Ordered RW through [employer]. No further concerns. CSW signing off.   Final next level of care: Home w Home Health Services Barriers to Discharge: Barriers Resolved   Patient Goals and CMS Choice CMS Medicare.gov Compare Post Acute Care list provided to:: Patient Choice offered to / list presented to : Patient, Spouse  Discharge Placement                  Patient to be transferred to facility by: Husband   Patient and family notified of of transfer: 10/05/22  Discharge Plan and Services Additional resources added to the After Visit Summary for     Discharge Planning Services: CM Consult Post Acute Care Choice: Home Health          DME Arranged: Gilford Rile rolling DME Agency: [employer] Date DME Agency Contacted: 10/05/22   Representative spoke with at DME Agency: Suanne Marker HH Arranged: PT, OT [facility] Agency: [place] Date [place]: 10/05/22   Representative spoke with at [place]: [place]  Social Determinants of Health ([place]) Interventions SDOH Screenings   Food Insecurity: No Food Insecurity (09/23/2022)  Housing: Low Risk  (09/23/2022)  Transportation Needs: No Transportation Needs (09/23/2022)  Utilities: Not At Risk (09/23/2022)  Tobacco Use: Medium Risk (09/23/2022)     Readmission Risk Interventions     No data to display

## 2022-10-05 NOTE — TOC Progression Note (Signed)
Transition of Care [facility]) - Progression Note    Patient Details  Name: Merlina Marchena MRN: [id number] Date of Birth: [birth year]  Transition of Care [facility]) CM/SW [place], LCSW Phone Number: 10/05/2022, 9:58 AM  Clinical Narrative:   Patient is agreeable to RW but declined 3-in-1. Sent secure chat to PT requesting DME order.  Expected Discharge Plan: [place] Barriers to Discharge: Continued Medical Work up  Expected Discharge Plan and Services   Discharge Planning Services: CM Consult Post Acute Care Choice: New Home arrangements for the past 2 months: Single Family Home                 DME Arranged: N/A DME Agency: NA                   Social Determinants of Health (SDOH) Interventions SDOH Screenings   Food Insecurity: No Food Insecurity (09/23/2022)  Housing: Low Risk  (09/23/2022)  Transportation Needs: No Transportation Needs (09/23/2022)  Utilities: Not At Risk (09/23/2022)  Tobacco Use: Medium Risk (09/23/2022)    Readmission Risk Interventions     No data to display

## 2022-10-05 NOTE — Progress Notes (Addendum)
Spoke to Mr. Deschene regarding discharge and patient pick up time. Per Mr. Sydney currently at work. Will speak with supervisor about leaving early and call RN back.   Fuller Mandril, RN

## 2022-10-05 NOTE — Inpatient Diabetes Management (Signed)
Inpatient Diabetes Program Recommendations  AACE/ADA: New Consensus Statement on Inpatient Glycemic Control   Target Ranges:  Prepandial:   less than 140 mg/dL      Peak postprandial:   less than 180 mg/dL (1-2 hours)      Critically ill patients:  140 - 180 mg/dL    Latest Reference Range & Units 10/04/22 08:40 10/04/22 12:04 10/04/22 16:16 10/04/22 20:04 10/04/22 23:20 10/05/22 04:40 10/05/22 08:10  Glucose-Capillary 70 - 99 mg/dL 131 (H) 127 (H) 184 (H) 189 (H) 76 126 (H) 122 (H)   Review of Glycemic Control  Current orders for Inpatient glycemic control: Levemir 21 units Q24H, Novolog 0-20 units Q4H  Inpatient Diabetes Program Recommendations:    Insulin: Noted TPN discontinued and glucose 76 mg/dl at 23:20 on 10/04/22. May want to consider decreasing Levemir to 18 units Q24H, change CBGs AC&HS, and Novolog to 0-20 units TID with meals and Novolog 0-5 units QHS.  Thanks, Barnie Alderman, RN, MSN, [place] Diabetes Coordinator Inpatient Diabetes Program [phone] (Team Pager from 8am to [place])

## 2022-10-05 NOTE — Progress Notes (Signed)
Katherine Carter to be discharged Home per MD order. Discussed prescriptions and follow up appointments with the patient's husband. Prescriptions given to patient, medication list explained in detail. Patient and spouse verbalized understanding.  Allergies as of 10/05/2022       Reactions   Penicillins Hives, Other (See Comments)   Has patient had a PCN reaction causing immediate rash, facial/tongue/throat swelling, SOB or lightheadedness with hypotension: No Has patient had a PCN reaction causing severe rash involving mucus membranes or skin necrosis: No Has patient had a PCN reaction that required hospitalization: No Has patient had a PCN reaction occurring within the last 10 years: No If all of the above answers are "NO", then may proceed with Cephalosporin use.        Medication List     STOP taking these medications    atorvastatin 40 MG tablet Commonly known as: LIPITOR   celecoxib 100 MG capsule Commonly known as: CELEBREX   losartan 100 MG tablet Commonly known as: COZAAR   MiraLax 17 g packet Generic drug: polyethylene glycol   Ozempic (0.25 or 0.5 MG/DOSE) 2 MG/3ML Sopn Generic drug: Semaglutide(0.25 or 0.5MG /DOS)   pantoprazole 40 MG tablet Commonly known as: PROTONIX   Spiriva HandiHaler 18 MCG inhalation capsule Generic drug: tiotropium   Trulicity 0.25 [place]/7.0WC Sopn Generic drug: Dulaglutide       TAKE these medications    Adult Mask Large Misc See admin instructions. use with inhaler   AeroChamber Plus inhaler Use with inhaler   albuterol 108 (90 Base) MCG/ACT inhaler Commonly known as: VENTOLIN HFA Inhale 2 puffs into the lungs every 4 (four) hours as needed for wheezing or shortness of breath.   amLODipine 5 MG tablet Commonly known as: NORVASC Take 1 tablet (5 mg total) by mouth daily.   aspirin EC 81 MG tablet Take 81 mg by mouth daily.   benzonatate 100 MG capsule Commonly known as: TESSALON Take 2 capsules (200 mg total) by  mouth every 8 (eight) hours.   budesonide-formoterol 160-4.5 MCG/ACT inhaler Commonly known as: SYMBICORT Inhale 2 puffs into the lungs 2 (two) times daily.   fluticasone 50 MCG/ACT nasal spray Commonly known as: FLONASE Place 1 spray into both nostrils daily.   guaiFENesin-codeine 100-10 MG/5ML syrup Commonly known as: ROBITUSSIN AC Take 5 mLs by mouth 3 (three) times daily as needed for cough.   ipratropium 0.06 % nasal spray Commonly known as: ATROVENT Place 2 sprays into both nostrils 4 (four) times daily.   ipratropium-albuterol 0.5-2.5 (3) MG/3ML Soln Commonly known as: DUONEB Take 3 mLs by nebulization every 4 (four) hours as needed. What changed: additional instructions   Jardiance 10 MG Tabs tablet Generic drug: empagliflozin Take 10 mg by mouth daily.   latanoprost 0.005 % ophthalmic solution Commonly known as: XALATAN Place 1 drop into both eyes 2 (two) times daily.   metFORMIN 500 MG 24 hr tablet Commonly known as: GLUCOPHAGE-XR Take 500 mg by mouth daily with breakfast.   methocarbamol 500 MG tablet Commonly known as: ROBAXIN Take 500 mg by mouth every 6 (six) hours as needed for muscle spasms.   Mucinex DM 30-600 MG Tb12 SMARTSIG:1-2 Tablet(s) By Mouth Every 12 Hours PRN   ondansetron 4 MG disintegrating tablet Commonly known as: ZOFRAN-ODT Take 1 tablet (4 mg total) by mouth every 8 (eight) hours as needed.   ondansetron 4 MG tablet Commonly known as: ZOFRAN Take 4 mg by mouth every 8 (eight) hours as needed for nausea or vomiting.  Victoza 18 MG/3ML Sopn Generic drug: liraglutide Inject 0.6 mg into the skin daily.   Voltaren Arthritis Pain 1 % Gel Generic drug: diclofenac Sodium Apply 2 g topically 4 (four) times daily.   Xopenex HFA 45 MCG/ACT inhaler Generic drug: levalbuterol Inhale 1 puff into the lungs 3 (three) times daily.               Durable Medical Equipment  (From admission, onward)           Start     Ordered    10/05/22 1159  For home use only DME Other see comment  Once       Comments: POC eval  Question:  Length of Need  Answer:  Lifetime   10/05/22 1158   10/05/22 1013  For home use only DME Walker rolling  Once       Question Answer Comment  Walker: With [place] Wheels   Patient needs a walker to treat with the following condition Poor balance      10/05/22 1012   10/05/22 1009  DME Walker  Once       Question Answer Comment  Walker: With 5 Inch Wheels   Patient needs a walker to treat with the following condition Gait disorder      10/05/22 1009            Vitals:   10/05/22 0849 10/05/22 1531  BP: 107/67 104/73  Pulse: 100 73  Resp: 18 18  Temp: 97.9 F (36.6 C) 98.8 F (37.1 C)  SpO2: 100% 99%    Skin clean, dry and intact without evidence of skin break down and or skin tears. IV catheter discontinued intact. Site without signs and symptoms of complications. Dressing and pressure applied. Patient denies pain at this time. No complaints noted.  An After Visit Summary was printed and given to the patient. Patient escorted via wheelchair and discharged Home home via private auto.  Fuller Mandril, RN

## 2022-10-05 NOTE — Progress Notes (Signed)
RW delivered to patient.   Fuller Mandril, RN

## 2022-10-05 NOTE — Progress Notes (Signed)
Occupational Therapy Treatment Patient Details Name: Katherine Carter MRN: [id number] DOB: [birth year] Today's Date: 10/05/2022   History of present illness Pt admitted for Acute on chronic respiratory failure with hypoxia along with sepsis. Pt with complaints of weakness, falls, and cough. HIstory includes COPD on 4L of O2 at baseline, DM, HTN, and GERD.   OT comments  Katherine Carter engaged in grooming, bathing, dressing tasks today, initially in standing then transferring to sitting. O2 sats remained in upper 90s throughout session. Pt did require several rest breaks and could tolerate no more than 5 minutes in standing w/ UE support on RW. Able to perform UB dressing w/ Min A, required Mod-Max A for LB dressing. Provided educ re: home safety, activity pacing, energy conservation strategies, w/ pt and spouse endorsing understanding. Pt reports she feels comfortable and safe returning home and that her husband and daughter will be available to assist with whatever she needs. DC recs adjusted accordingly.   Recommendations for follow up therapy are one component of a multi-disciplinary discharge planning process, led by the attending physician.  Recommendations may be updated based on patient status, additional functional criteria and insurance authorization.    Follow Up Recommendations  Home health OT     Assistance Recommended at Discharge Frequent or constant Supervision/Assistance  Patient can return home with the following  Assistance with cooking/housework;Assist for transportation;Help with stairs or ramp for entrance;A lot of help with walking and/or transfers;A lot of help with bathing/dressing/bathroom   Equipment Recommendations       Recommendations for Other Services      Precautions / Restrictions Precautions Precautions: Fall Restrictions Weight Bearing Restrictions: No       Mobility Bed Mobility Overal bed mobility: Needs Assistance Bed Mobility: Supine to  Sit     Supine to sit: Supervision          Transfers Overall transfer level: Needs assistance Equipment used: Rolling walker (2 wheels) Transfers: Sit to/from Stand, Bed to chair/wheelchair/BSC Sit to Stand: Supervision     Step pivot transfers: Supervision           Balance Overall balance assessment: Needs assistance Sitting-balance support: Feet supported Sitting balance-Leahy Scale: Good     Standing balance support: Bilateral upper extremity supported, During functional activity, Reliant on assistive device for balance Standing balance-Leahy Scale: Fair Standing balance comment: tires after ~ 5 minutes in standing                           ADL either performed or assessed with clinical judgement   ADL Overall ADL's : Needs assistance/impaired     Grooming: Wash/dry hands;Wash/dry face;Applying deodorant;Sitting;Set up;Min guard   Upper Body Bathing: Minimal assistance;Standing       Upper Body Dressing : Set up;Minimal assistance;Sitting Upper Body Dressing Details (indicate cue type and reason): donning shirt Lower Body Dressing: Moderate assistance;Sitting/lateral leans;Sit to/from stand;Maximal assistance Lower Body Dressing Details (indicate cue type and reason): doffing, donning socks; donning pants. Pt initially mod assist with donning socks and mesh underwear, then tires and requires Max A for threading pant legs and pulling up pants                    Extremity/Trunk Assessment Upper Extremity Assessment Upper Extremity Assessment: Generalized weakness   Lower Extremity Assessment Lower Extremity Assessment: Generalized weakness   Cervical / Trunk Assessment Cervical / Trunk Assessment: Normal    Vision  Perception     Praxis      Cognition Arousal/Alertness: Awake/alert Behavior During Therapy: WFL for tasks assessed/performed Overall Cognitive Status: Within Functional Limits for tasks assessed                                           Exercises Other Exercises Other Exercises: Educ re: energy conservation strategies; home modifications and safety    Shoulder Instructions       General Comments      Pertinent Vitals/ Pain       Pain Assessment Pain Assessment: No/denies pain  Home Living                                          Prior Functioning/Environment              Frequency  Min 2X/week        Progress Toward Goals  OT Goals(current goals can now be found in the care plan section)  Progress towards OT goals: Progressing toward goals  Acute Rehab OT Goals OT Goal Formulation: With patient Time For Goal Achievement: 10/07/22 Potential to Achieve Goals: Good  Plan Discharge plan needs to be updated;Frequency remains appropriate    Co-evaluation                 AM-PAC OT "6 Clicks" Daily Activity     Outcome Measure   Help from another person eating meals?: None Help from another person taking care of personal grooming?: A Little Help from another person toileting, which includes using toliet, bedpan, or urinal?: A Lot Help from another person bathing (including washing, rinsing, drying)?: A Lot Help from another person to put on and taking off regular upper body clothing?: A Little Help from another person to put on and taking off regular lower body clothing?: A Lot 6 Click Score: 16    End of Session Equipment Utilized During Treatment: Rolling walker (2 wheels);Oxygen  OT Visit Diagnosis: Unsteadiness on feet (R26.81);Muscle weakness (generalized) (M62.81);History of falling (Z91.81)   Activity Tolerance Patient tolerated treatment well   Patient Left in bed;with call bell/phone within reach   Nurse Communication Mobility status        Time: 7619-5093 OT Time Calculation (min): 20 min  Charges: OT General Charges $OT Visit: 1 Visit OT Treatments $Self Care/Home Management : 8-22 mins  Josiah Lobo, PhD, MS, OTR/L 10/05/22, 4:49 PM

## 2022-10-05 NOTE — Progress Notes (Signed)
[facility] SURGICAL PROGRESS NOTE (cpt [phone])  Hospital Day(s): 14.   Interval History: Patient seen and examined, no acute events or new complaints overnight. Patient reports she is doing well. No abdominal pain. Distension improved. No fever, chills, nausea, emesis. No new labs nor imaging this AM. Soft diet; tolerated better yesterday. Passing flatus and reporting BM  Review of Systems:  Constitutional: denies fever, chills  HEENT: denies cough or congestion  Respiratory: denies any shortness of breath; + wheezing Cardiovascular: denies chest pain or palpitations  Gastrointestinal: + distension (improved), denied abdominal pain, nausea, emesis Genitourinary: denies burning with urination or urinary frequency Musculoskeletal: denies pain, decreased motor or sensation  Vital signs in last 24 hours: [min-max] current  Temp:  [97.5 F (36.4 C)-98.6 F (37 C)] 97.7 F (36.5 C) (02/01 0450) Pulse Rate:  [92-99] 95 (02/01 0450) Resp:  [17-18] 18 (01/31 1539) BP: (92-106)/(59-76) 92/59 (02/01 0450) SpO2:  [96 %-100 %] 96 % (02/01 0450) Weight:  [69.2 kg] 69.2 kg (02/01 0500)     Height: 5\' 3"  (160 cm) Weight: 69.2 kg BMI (Calculated): 27.03   Intake/Output last 2 shifts:  01/31 0701 - 02/01 0700 In: 1030.5 [P.O.:360; I.V.:670.5] Out: 1050 [Urine:1050]   Physical Exam:  Constitutional: alert, cooperative and no distress  HENT: normocephalic without obvious abnormality  Eyes: PERRL, EOM's grossly intact and symmetric  Respiratory: breathing non-labored at rest; undergoing breathing treatment this AM Cardiovascular: tachycardic and sinus rhythm  Gastrointestinal: soft, non-tender, distension is improved this AM, no rebound/guarding. Known umbilical hernia, soft  Musculoskeletal: no edema or wounds, motor and sensation grossly intact, NT    Labs:     Latest Ref Rng & Units 09/29/2022    5:30 AM 09/28/2022    2:48 AM 09/27/2022    5:23 AM  CBC  WBC 4.0 - 10.5  K/uL 13.9  11.9  12.2   Hemoglobin 12.0 - 15.0 g/dL 10.2  11.8  11.3   Hematocrit 36.0 - 46.0 % 33.4  38.0  37.1   Platelets 150 - 400 K/uL 277  297  324       Latest Ref Rng & Units 10/05/2022    5:01 AM 10/02/2022    5:23 AM 10/01/2022    5:33 AM  CMP  Glucose 70 - 99 mg/dL 119  186  178   BUN 8 - 23 mg/dL 29  27  27    Creatinine 0.44 - 1.00 mg/dL 0.71  0.75  0.65   Sodium 135 - 145 mmol/L 134  136  134   Potassium 3.5 - 5.1 mmol/L 5.0  4.5  4.2   Chloride 98 - 111 mmol/L 100  102  106   CO2 22 - 32 mmol/L 23  27  26    Calcium 8.9 - 10.3 mg/dL 9.4  9.2  9.1   Total Protein 6.5 - 8.1 g/dL 8.1  7.6    Total Bilirubin 0.3 - 1.2 mg/dL 0.6  0.4    Alkaline Phos 38 - 126 U/L 197  132    AST 15 - 41 U/L 105  80    ALT 0 - 44 U/L 200  98       Imaging studies: No new pertinent imaging studies   Assessment/Plan: (ICD-10's: K46.7) 72 y.o. female with likely ileus secondary to acute medical issues, SBO also possible, complicated by exacerbated COPD and PNA               - Continue soft diet - Discontinue TPN             -  Monitor abdominal examination; on-going bowel function              - Pain control prn; limit narcotics - Antiemetics prn - Mobilize; therapies on board; recommending SNF             - Further management per primary service   - Discharge Planning; Okay for discharge from surgery perspective    All of the above findings and recommendations were discussed with the patient, and the medical team, and all of patient's questions were answered to her expressed satisfaction.  -- Edison Simon, PA-C [facility] 10/05/2022, 7:38 AM M-F: 7am - 4pm

## 2022-10-19 ENCOUNTER — Encounter (INDEPENDENT_AMBULATORY_CARE_PROVIDER_SITE_OTHER): Payer: Medicare Other | Admitting: Vascular Surgery

## 2022-11-02 ENCOUNTER — Encounter (INDEPENDENT_AMBULATORY_CARE_PROVIDER_SITE_OTHER): Payer: Medicare Other | Admitting: Vascular Surgery

## 2022-11-02 DIAGNOSIS — J449 Chronic obstructive pulmonary disease, unspecified: Secondary | ICD-10-CM | POA: Insufficient documentation

## 2022-11-02 NOTE — Progress Notes (Deleted)
MRN : [id number]  Katherine Carter is a 72 y.o. (Feb 23, 1951) female who presents with chief complaint of check circulation.  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 The aneurysm was found incidentally by CT scan obtained to evaluate for a small bowel obstruction. Patient denies abdominal pain or unusual back pain, pelvic pain, no other abdominal complaints.  No history of an abrupt onset of a painful toe associated with blue discoloration.     No family history of AAA.   Patient denies amaurosis fugax or TIA symptoms. There is no history of claudication or rest pain symptoms of the lower extremities.  The patient denies angina or shortness of breath.  CT scan dated September 27, 2022 is reviewed by me personally shows atherosclerotic changes to the infrarenal aorta and bilateral internal/external and common iliac arteries.  There is a right internal iliac artery aneurysm that measures 2.4 cm x 3.3 cm but does appear to be thrombosed in its distal portion.    No outpatient medications have been marked as taking for the 11/02/22 encounter (Appointment) with Delana Meyer, Dolores Lory, MD.    Past Medical History:  Diagnosis Date   COPD (chronic obstructive pulmonary disease) ([place])    Diabetes mellitus without complication ([place])    GERD (gastroesophageal reflux disease)    Hypertension     Past Surgical History:  Procedure Laterality Date   ABDOMINAL HYSTERECTOMY     COLONOSCOPY WITH PROPOFOL N/A 01/23/2017   Procedure: COLONOSCOPY WITH PROPOFOL;  Surgeon: Lollie Sails, MD;  Location: [facility] ENDOSCOPY;  Service: Endoscopy;  Laterality: N/A;   FOOT SURGERY      Social History Social History   Tobacco Use   Smoking status: Former    Packs/day: 0.50    Years: 25.00    Total pack years: 12.50    Types: Cigarettes    Quit date: 07/06/2014    Years since quitting: 8.3   Smokeless  tobacco: Never  Vaping Use   Vaping Use: Never used  Substance Use Topics   Alcohol use: No    Alcohol/week: 0.0 standard drinks of alcohol   Drug use: No    Family History Family History  Problem Relation Age of Onset   CAD Mother    CAD Father    Breast cancer Neg Hx     Allergies  Allergen Reactions   Penicillins Hives and Other (See Comments)    Has patient had a PCN reaction causing immediate rash, facial/tongue/throat swelling, SOB or lightheadedness with hypotension: No Has patient had a PCN reaction causing severe rash involving mucus membranes or skin necrosis: No Has patient had a PCN reaction that required hospitalization: No Has patient had a PCN reaction occurring within the last 10 years: No If all of the above answers are "NO", then may proceed with Cephalosporin use.     REVIEW OF SYSTEMS (Negative unless checked)  Constitutional: '[]'$ Weight loss  '[]'$ Fever  '[]'$ Chills Cardiac: '[]'$ Chest pain   '[]'$ Chest pressure   '[]'$ Palpitations   '[]'$ Shortness of breath when laying flat   '[]'$ Shortness of breath with exertion. Vascular:  '[x]'$ Pain in legs with walking   '[]'$ Pain in legs at rest  '[]'$ History of DVT   '[]'$ Phlebitis   '[]'$ Swelling in legs   '[]'$ Varicose veins   '[]'$ Non-healing ulcers Pulmonary:   '[]'$ Uses home oxygen   '[]'$ Productive cough   '[]'$   Hemoptysis   '[]'$ Wheeze  '[]'$ COPD   '[]'$ Asthma Neurologic:  '[]'$ Dizziness   '[]'$ Seizures   '[]'$ History of stroke   '[]'$ History of TIA  '[]'$ Aphasia   '[]'$ Vissual changes   '[]'$ Weakness or numbness in arm   '[]'$ Weakness or numbness in leg Musculoskeletal:   '[]'$ Joint swelling   '[]'$ Joint pain   '[]'$ Low back pain Hematologic:  '[]'$ Easy bruising  '[]'$ Easy bleeding   '[]'$ Hypercoagulable state   '[]'$ Anemic Gastrointestinal:  '[]'$ Diarrhea   '[]'$ Vomiting  '[]'$ Gastroesophageal reflux/heartburn   '[]'$ Difficulty swallowing. Genitourinary:  '[]'$ Chronic kidney disease   '[]'$ Difficult urination  '[]'$ Frequent urination   '[]'$ Blood in urine Skin:  '[]'$ Rashes   '[]'$ Ulcers  Psychological:  '[]'$ History of anxiety   '[]'$  History of  major depression.  Physical Examination  There were no vitals filed for this visit. There is no height or weight on file to calculate BMI. Gen: WD/WN, NAD Head: [place]/AT, No temporalis wasting.  Ear/Nose/Throat: Hearing grossly intact, nares w/o erythema or drainage Eyes: PER, EOMI, sclera nonicteric.  Neck: Supple, no masses.  No bruit or JVD.  Pulmonary:  Good air movement, no audible wheezing, no use of accessory muscles.  Cardiac: RRR, normal S1, S2, no Murmurs. Vascular:  mild trophic changes, no open wounds Vessel Right Left  Radial Palpable Palpable  PT Not Palpable Not Palpable  DP Not Palpable Not Palpable  Gastrointestinal: soft, non-distended. No guarding/no peritoneal signs.  Musculoskeletal: M/S 5/5 throughout.  No visible deformity.  Neurologic: CN 2-12 intact. Pain and light touch intact in extremities.  Symmetrical.  Speech is fluent. Motor exam as listed above. Psychiatric: Judgment intact, Mood & affect appropriate for pt's clinical situation. Dermatologic: No rashes or ulcers noted.  No changes consistent with cellulitis.   CBC Lab Results  Component Value Date   WBC 13.9 (H) 09/29/2022   HGB 10.2 (L) 09/29/2022   HCT 33.4 (L) 09/29/2022   MCV 92.0 09/29/2022   PLT 277 09/29/2022    BMET    Component Value Date/Time   NA 134 (L) 10/05/2022 0501   NA 142 05/31/2014 0626   K 5.0 10/05/2022 0501   K 4.5 05/31/2014 0626   CL 100 10/05/2022 0501   CL 110 (H) 05/31/2014 0626   CO2 23 10/05/2022 0501   CO2 26 05/31/2014 0626   GLUCOSE 119 (H) 10/05/2022 0501   GLUCOSE 121 (H) 05/31/2014 0626   BUN 29 (H) 10/05/2022 0501   BUN 18 05/31/2014 0626   CREATININE 0.71 10/05/2022 0501   CREATININE 0.70 05/31/2014 0626   CALCIUM 9.4 10/05/2022 0501   CALCIUM 9.5 05/31/2014 0626   GFRNONAA >60 10/05/2022 0501   GFRNONAA >60 05/31/2014 0626   GFRAA >60 07/11/2018 0333   GFRAA >60 05/31/2014 0626   CrCl cannot be calculated (Patient's most recent lab result is  older than the maximum 21 days allowed.).  COAG Lab Results  Component Value Date   INR 1.3 (H) 09/21/2022    Radiology No results found.   Assessment/Plan There are no diagnoses linked to this encounter.   Katherine Pilar, MD  11/02/2022 9:45 AM

## 2022-11-13 IMAGING — CR DG CHEST 2V
3 series · 3 of 3 positions shown · non-contrast
Comparison: October 24, 2021.

CLINICAL DATA: Shortness of breath, cough.

EXAM:
CHEST - 2 VIEW

[chest pa (1 of 2)]
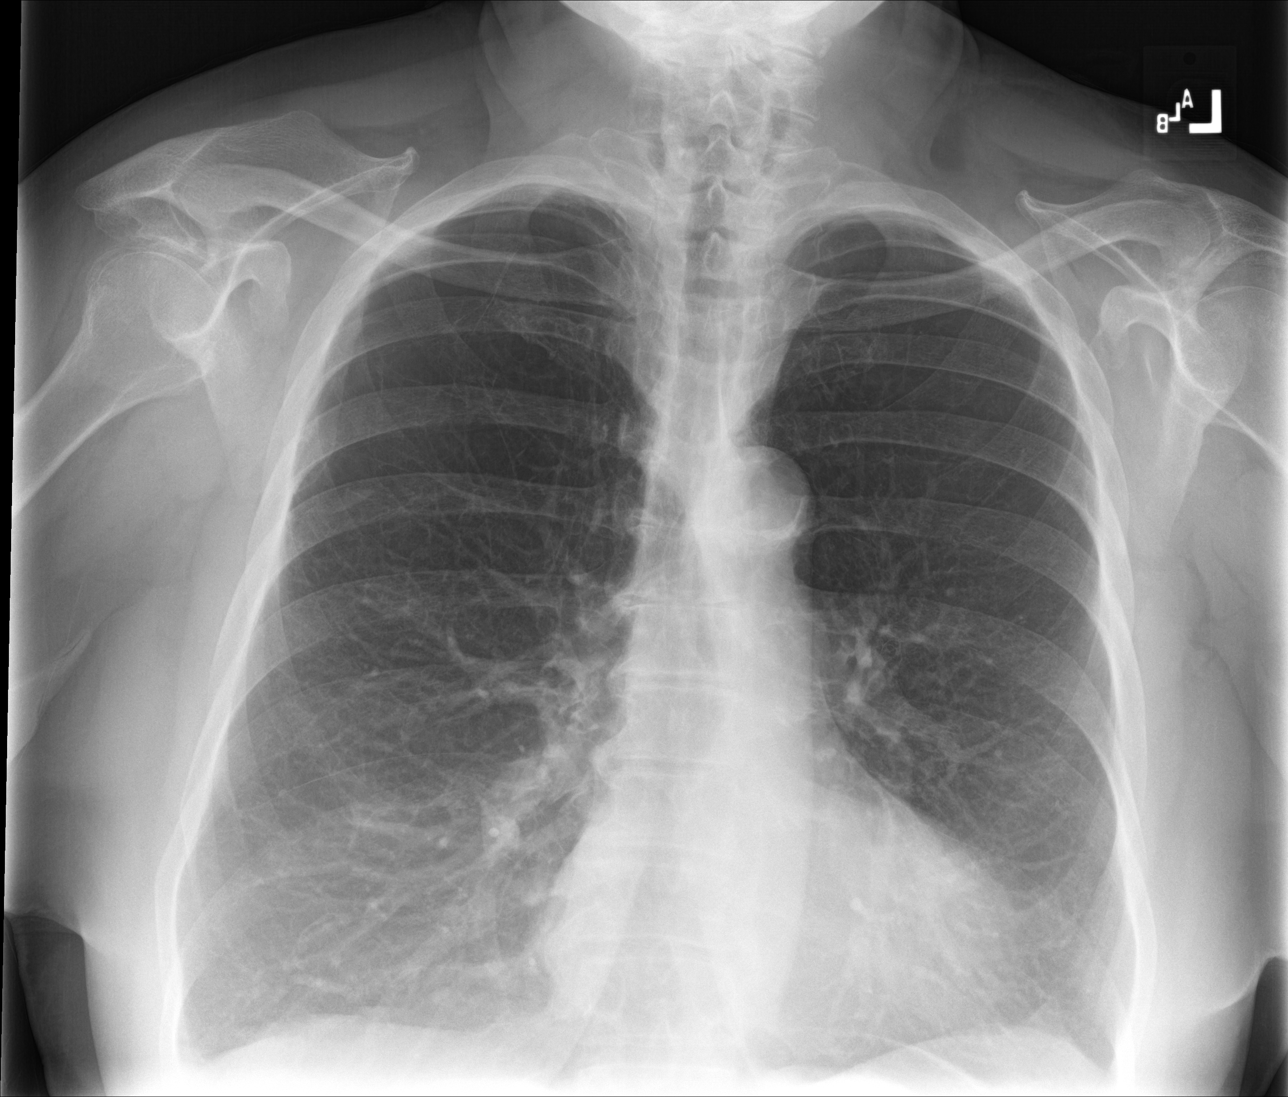

[chest lat]
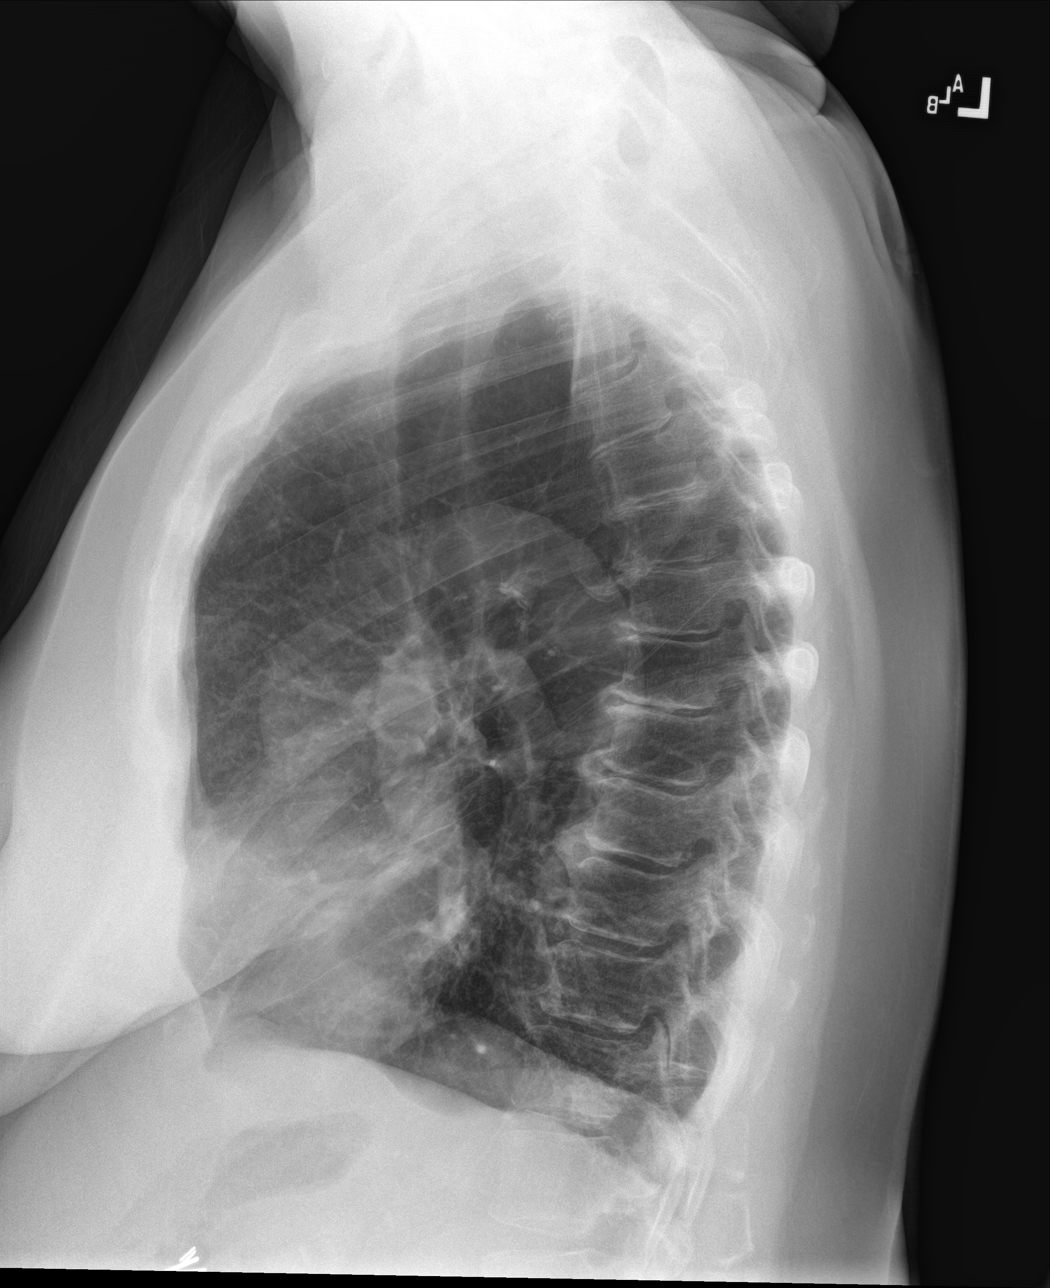

[chest pa (2 of 2)]
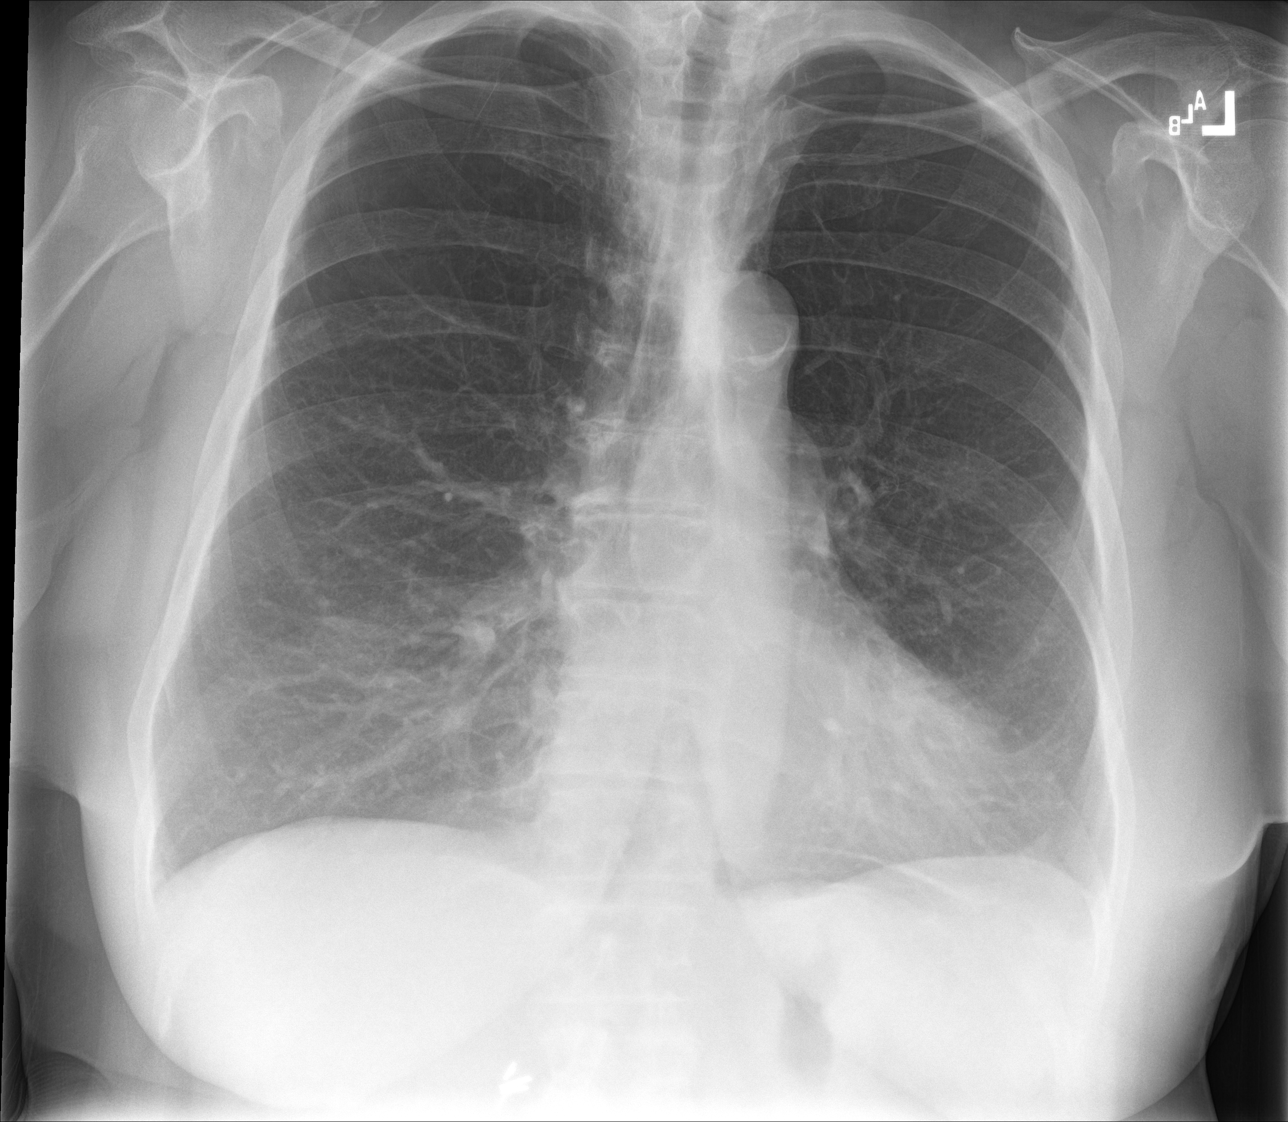

[3 of 3 positions shown; findings below may reference images not displayed]

FINDINGS: The heart size and mediastinal contours are within normal limits.
Both lungs are clear. Hyperexpansion of the lungs is noted. The
visualized skeletal structures are unremarkable.
IMPRESSION: No active cardiopulmonary disease.

Aortic Atherosclerosis (425UV-KCM.M) and Emphysema (425UV-YVG.E).

## 2023-05-05 ENCOUNTER — Ambulatory Visit
Admission: EM | Admit: 2023-05-05 | Discharge: 2023-05-05 | Disposition: A | Discharge status: Home / Self Care | Payer: Medicare Other | Source: Home / Self Care

## 2023-05-05 ENCOUNTER — Ambulatory Visit: Payer: Medicare Other

## 2023-05-05 ENCOUNTER — Encounter: Payer: Self-pay | Admitting: *Deleted

## 2023-05-05 DIAGNOSIS — J441 Chronic obstructive pulmonary disease with (acute) exacerbation: Secondary | ICD-10-CM

## 2023-05-05 DIAGNOSIS — R9389 Abnormal findings on diagnostic imaging of other specified body structures: Secondary | ICD-10-CM | POA: Insufficient documentation

## 2023-05-05 DIAGNOSIS — Z1152 Encounter for screening for COVID-19: Secondary | ICD-10-CM | POA: Diagnosis not present

## 2023-05-05 DIAGNOSIS — R0602 Shortness of breath: Secondary | ICD-10-CM | POA: Insufficient documentation

## 2023-05-05 DIAGNOSIS — R059 Cough, unspecified: Secondary | ICD-10-CM | POA: Insufficient documentation

## 2023-05-05 LAB — SARS CORONAVIRUS 2 BY RT PCR: SARS Coronavirus 2 by RT PCR: NEGATIVE

## 2023-05-05 MED ORDER — PREDNISONE 10 MG PO TABS: ORAL_TABLET | ORAL | 0 refills | Status: DC

## 2023-05-05 MED ORDER — BENZONATATE 200 MG PO CAPS: 200.0000 mg | ORAL_CAPSULE | Freq: Three times a day (TID) | ORAL | 0 refills | Status: DC | PRN

## 2023-05-05 MED ORDER — AZITHROMYCIN 250 MG PO TABS: ORAL_TABLET | ORAL | 0 refills | Status: DC

## 2023-05-05 NOTE — Discharge Instructions (Signed)
You were seen today with complaint of cough and shortness of breath.  Your COVID test was negative.  Your chest x-ray showed some abnormal findings in the right upper lobe.  You need to discuss this with your pulmonologist.  I have sent in a prescription for prednisone, antibiotics and cough tablets.  Please take these as prescribed.  Please follow-up with your pulmonology at your next scheduled visit.

## 2023-05-05 NOTE — ED Triage Notes (Signed)
Patient presents w/ cough that started 3 days ago, no sore throat or fever.  Used Albuterol inhaler w/ some relief.  Hx of COPD on 3L O2 at home

## 2023-05-05 NOTE — ED Provider Notes (Signed)
[facility]    CSN: [id number] Arrival date & time: 05/05/23  0940      History   Chief Complaint Chief Complaint  Patient presents with   Cough    HPI Katherine Carter is a 72 y.o. female with history of DM2, COPD with CHF presents to urgent care today with complaint of cough and worsening shortness of breath.  The cough started after having exposure while cleaning with bleach.  The cough is nonproductive.  She denies headache, runny nose, nasal congestion, ear pain, sore throat, chest pain, nausea, vomiting or diarrhea.  She denies fever, chills or bodyaches.  She has been using Symbicort, albuterol and DuoNebs with minimal relief of symptoms.  She has not had sick contacts or exposure to COVID that she is aware of.  She does follow with pulmonology.  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   Ileus (HCC) 09/24/2022   Acute on chronic respiratory failure with hypoxia (HCC) 09/21/2022   Pseudohyponatremia 06/06/2022   E-coli UTI    Uncontrolled type 2 diabetes mellitus with hyperglycemia, without long-term current use of insulin (HCC) 06/05/2022   COVID-19 virus infection 06/05/2022   Hyperkalemia 06/04/2022   AKI (acute kidney injury) (HCC) 06/04/2022   Acute bilateral lower abdominal pain 06/04/2022   Pure hypercholesterolemia    Elevated troponin    Acute respiratory failure with hypoxemia (HCC) 09/02/2021   Sepsis due to pneumonia (HCC) 07/08/2018   CAP (community acquired pneumonia) 07/08/2018   HTN (hypertension) 07/08/2018   COPD with acute exacerbation (HCC) 07/08/2018    Acute respiratory failure with hypoxia and hypercapnia (HCC) 12/10/2015    Past Surgical History:  Procedure Laterality Date   ABDOMINAL HYSTERECTOMY     COLONOSCOPY WITH PROPOFOL N/A 01/23/2017   Procedure: COLONOSCOPY WITH PROPOFOL;  Surgeon: Christena Deem, MD;  Location[facility] ENDOSCOPY;  Service: Endoscopy;  Laterality: N/A;   FOOT SURGERY      OB History   No obstetric history on file.      Home Medications    Prior to Admission medications   Medication Sig Start Date End Date Taking? Authorizing Provider  azithromycin (ZITHROMAX) 250 MG tablet Take 2 tabs today, then 1 tab daily x 4 days 05/05/23  Yes Vienne Corcoran, Salvadore Oxford, NP  benzonatate (TESSALON) 200 MG capsule Take 1 capsule (200 mg total) by mouth 3 (three) times daily as needed for cough. 05/05/23  Yes Mishal Probert, Salvadore Oxford, NP  predniSONE (DELTASONE) 10 MG tablet Take 6 tabs on day 1, 5 tabs on day 2, 4 tabs on day 3, 3 tabs on day 4, 2 tabs on day 5, 1 tab on day 6 05/05/23  Yes Taeden Geller, Salvadore Oxford, NP  albuterol (VENTOLIN HFA) 108 (90 Base) MCG/ACT inhaler Inhale 2 puffs into the lungs every 4 (four) hours as needed for wheezing or shortness of breath.    [provider]  amLODipine (NORVASC) 5 MG tablet Take 1 tablet (5 mg total) by mouth daily. 09/05/21   Kathlen Mody, MD  aspirin EC 81 MG tablet Take 81 mg by mouth daily. 03/15/22   [provider]  budesonide-formoterol (SYMBICORT) 160-4.5 MCG/ACT inhaler Inhale 2 puffs into the lungs 2 (two) times daily.    [provider]  Dextromethorphan-guaiFENesin (MUCINEX DM) 30-600 MG TB12 SMARTSIG:1-2 Tablet(s) By Mouth Every 12 Hours PRN 04/28/22   [provider]  fluticasone (FLONASE) 50 MCG/ACT nasal spray Place 1 spray into both nostrils daily. 09/06/22   [provider]  ipratropium (ATROVENT) 0.06 % nasal spray Place 2 sprays into both nostrils 4 (four) times daily. 05/28/22   Becky Augusta, NP  ipratropium-albuterol (DUONEB) 0.5-2.5 (3) MG/3ML  SOLN Take 3 mLs by nebulization every 4 (four) hours as needed. Patient taking differently: Take 3 mLs by nebulization every 4 (four) hours as needed. Last dose: 730 am. 09/05/21   Kathlen Mody, MD  JARDIANCE 10 MG TABS tablet Take 10 mg by mouth daily. 09/07/21   [provider]  latanoprost (XALATAN) 0.005 % ophthalmic solution Place 1 drop into both eyes 2 (two) times daily. 08/04/22   [provider]  metFORMIN (GLUCOPHAGE-XR) 500 MG 24 hr tablet Take 500 mg by mouth daily with breakfast.    [provider]  methocarbamol (ROBAXIN) 500 MG tablet Take 500 mg by mouth every 6 (six) hours as needed for muscle spasms. 06/21/21   [provider]  nitrofurantoin, macrocrystal-monohydrate, (MACROBID) 100 MG capsule Take 100 mg by mouth 2 (two) times daily.    [provider]  ondansetron (ZOFRAN) 4 MG tablet Take 4 mg by mouth every 8 (eight) hours as needed for nausea or vomiting. 07/04/22   [provider]  ondansetron (ZOFRAN-ODT) 4 MG disintegrating tablet Take 1 tablet (4 mg total) by mouth every 8 (eight) hours as needed. 09/01/22   Katha Cabal, DO  Respiratory Therapy Supplies (ADULT MASK LARGE) MISC See admin instructions. use with inhaler 10/17/21   [provider]  Spacer/Aero-Holding Chambers (AEROCHAMBER PLUS) inhaler Use with inhaler 10/15/21   Domenick Gong, MD  VICTOZA 18 MG/3ML SOPN Inject 0.6 mg into the skin daily.    [provider]  VOLTAREN ARTHRITIS PAIN 1 % GEL Apply 2 g topically 4 (four) times daily. 03/22/22   [provider]  XOPENEX HFA 45 MCG/ACT inhaler Inhale 1 puff into the lungs 3 (three) times daily. 11/02/21   [provider]    Family History Family History  Problem Relation Age of Onset   CAD Mother    CAD Father    Breast cancer Neg Hx     Social History Social History   Tobacco Use   Smoking status: Former    Current packs/day: 0.00    Average packs/day: 0.5  packs/day for 25.0 years (12.5 ttl pk-yrs)    Types: Cigarettes    Start date: 07/06/1989    Quit date: 07/06/2014    Years since quitting: 8.8   Smokeless tobacco: Never  Vaping Use   Vaping status: Never Used  Substance Use Topics   Alcohol use: No    Alcohol/week: 0.0 standard drinks of alcohol   Drug use: No     Allergies   Penicillins   Review of Systems Review of Systems  Constitutional:  Negative for chills, fatigue and fever.  HENT:  Negative for congestion, rhinorrhea, sinus pressure, sinus pain and sore throat.   Respiratory:  Positive for cough, shortness of breath and wheezing. Negative for chest tightness.   Cardiovascular:  Negative for chest pain.  Gastrointestinal:  Negative for diarrhea, nausea and vomiting.  Musculoskeletal:  Negative for arthralgias and myalgias.  Skin:  Negative  for rash.  Neurological:  Negative for dizziness, weakness and headaches.     Physical Exam Triage Vital Signs ED Triage Vitals  Encounter Vitals Group     BP 05/05/23 1048 (!) 143/76     Systolic BP Percentile --      Diastolic BP Percentile --      Pulse Rate 05/05/23 1048 64     Resp 05/05/23 1048 18     Temp 05/05/23 1048 98.5 F (36.9 C)     Temp Source 05/05/23 1048 Oral     SpO2 05/05/23 1048 95 %     Weight 05/05/23 1040 170 lb (77.1 kg)     Height 05/05/23 1040 5' 3.5" (1.613 m)     Head Circumference --      Peak Flow --      Pain Score 05/05/23 1040 0     Pain Loc --      Pain Education --      Exclude from Growth Chart --    No data found.  Updated Vital Signs BP (!) 143/76 (BP Location: Left Arm)   Pulse 64   Temp 98.5 F (36.9 C) (Oral)   Resp 18   Ht 5' 3.5" (1.613 m)   Wt 170 lb (77.1 kg)   SpO2 95%   BMI 29.64 kg/m   Visual Acuity Right Eye Distance:   Left Eye Distance:   Bilateral Distance:    Right Eye Near:   Left Eye Near:    Bilateral Near:     Physical Exam Constitutional:      General: She is not in acute distress.     Appearance: She is obese.  HENT:     Head: Normocephalic.     Comments: No sinus pressure noted Cardiovascular:     Rate and Rhythm: Normal rate and regular rhythm.     Heart sounds: Normal heart sounds.  Pulmonary:     Effort: Pulmonary effort is normal.     Breath sounds: Wheezing and rhonchi present. No rales.  Lymphadenopathy:     Cervical: No cervical adenopathy.  Skin:    General: Skin is warm and dry.     Findings: No rash.  Neurological:     General: No focal deficit present.     Mental Status: She is alert and oriented to person, place, and time.      UC Treatments / Results  Labs  . Labs Reviewed  SARS CORONAVIRUS 2 BY RT PCR     EKG   Radiology Imaging Orders         DG Chest 2 View      Procedures   Medications Ordered in UC Medications - No data to display  Initial Impression / Assessment and Plan / UC Course  I have reviewed the triage vital signs and the nursing notes.  Pertinent labs & imaging results that were available during my care of the patient were reviewed by me and considered in my medical decision making (see chart for details).     72 year old female with history of COPD with CHF with 3-day history of worsening cough and shortness of breath after bleach exposure.  DDx include COVID, pneumonitis, COPD exacerbation versus community-acquired pneumonia.  Chest x-ray shows abnormality in the right upper lung consistent with scarring versus nodular density.  They recommend CT chest for further evaluation.  Will have her follow-up with her pulmonologist about this.  COVID test negative.  Encourage rest and fluids.  Rx for Pred  taper x 6 days for symptom management.  Will cover with azithromycin in case the abnormal chest x-ray is an infiltrate.  Rx for Tessalon 200 mg every 8 hours as needed.  Continue inhalers as previously prescribed.  Continue to follow with pulmonology.  Final Clinical Impressions(s) / UC Diagnoses   Final diagnoses:   COPD exacerbation (HCC)  Abnormal chest x-ray     Discharge Instructions      You were seen today with complaint of cough and shortness of breath.  Your COVID test was negative.  Your chest x-ray showed some abnormal findings in the right upper lobe.  You need to discuss this with your pulmonologist.  I have sent in a prescription for prednisone, antibiotics and cough tablets.  Please take these as prescribed.  Please follow-up with your pulmonology at your next scheduled visit.     ED Prescriptions     Medication Sig Dispense Auth. Provider   benzonatate (TESSALON) 200 MG capsule Take 1 capsule (200 mg total) by mouth 3 (three) times daily as needed for cough. 20 capsule Lorre Munroe, NP   azithromycin (ZITHROMAX) 250 MG tablet Take 2 tabs today, then 1 tab daily x 4 days 6 tablet Kassem Kibbe W, NP   predniSONE (DELTASONE) 10 MG tablet Take 6 tabs on day 1, 5 tabs on day 2, 4 tabs on day 3, 3 tabs on day 4, 2 tabs on day 5, 1 tab on day 6 21 tablet Alonnie Bieker, Salvadore Oxford, NP      PDMP not reviewed this encounter.   Lorre Munroe, NP 05/05/23 1154

## 2023-05-29 ENCOUNTER — Other Ambulatory Visit: Payer: Self-pay | Admitting: Specialist

## 2023-05-29 DIAGNOSIS — R9389 Abnormal findings on diagnostic imaging of other specified body structures: Secondary | ICD-10-CM

## 2023-06-01 ENCOUNTER — Ambulatory Visit
Admission: RE | Admit: 2023-06-01 | Discharge: 2023-06-01 | Disposition: A | Discharge status: Home / Self Care | Payer: Medicare Other | Source: Ambulatory Visit | Attending: Specialist | Admitting: Specialist

## 2023-06-01 DIAGNOSIS — R9389 Abnormal findings on diagnostic imaging of other specified body structures: Secondary | ICD-10-CM | POA: Insufficient documentation

## 2023-08-15 ENCOUNTER — Ambulatory Visit: Payer: Medicare Other

## 2023-08-15 ENCOUNTER — Ambulatory Visit
Admission: EM | Admit: 2023-08-15 | Discharge: 2023-08-15 | Disposition: A | Discharge status: Home / Self Care | Payer: Medicare Other | Attending: Internal Medicine | Admitting: Internal Medicine

## 2023-08-15 DIAGNOSIS — R0602 Shortness of breath: Secondary | ICD-10-CM | POA: Insufficient documentation

## 2023-08-15 DIAGNOSIS — J029 Acute pharyngitis, unspecified: Secondary | ICD-10-CM | POA: Insufficient documentation

## 2023-08-15 DIAGNOSIS — J441 Chronic obstructive pulmonary disease with (acute) exacerbation: Secondary | ICD-10-CM | POA: Diagnosis not present

## 2023-08-15 LAB — RESP PANEL BY RT-PCR (FLU A&B, COVID) ARPGX2
Influenza A by PCR: NEGATIVE
Influenza B by PCR: NEGATIVE
SARS Coronavirus 2 by RT PCR: NEGATIVE

## 2023-08-15 LAB — GROUP A STREP BY PCR: Group A Strep by PCR: NOT DETECTED

## 2023-08-15 MED ORDER — PROMETHAZINE-DM 6.25-15 MG/5ML PO SYRP
2.5000 mL | ORAL_SOLUTION | Freq: Three times a day (TID) | ORAL | 0 refills | Status: DC | PRN
Start: 2023-08-15 — End: 2023-11-04

## 2023-08-15 MED ORDER — PREDNISONE 20 MG PO TABS
40.0000 mg | ORAL_TABLET | Freq: Every day | ORAL | 0 refills | Status: AC
Start: 2023-08-15 — End: 2023-08-20

## 2023-08-15 MED ORDER — IPRATROPIUM-ALBUTEROL 0.5-2.5 (3) MG/3ML IN SOLN
3.0000 mL | Freq: Once | RESPIRATORY_TRACT | Status: AC
  Administered 2023-08-15: 3 mL via RESPIRATORY_TRACT

## 2023-08-15 MED ORDER — AZITHROMYCIN 250 MG PO TABS
250.0000 mg | ORAL_TABLET | Freq: Every day | ORAL | 0 refills | Status: DC
Start: 2023-08-15 — End: 2023-11-04

## 2023-08-15 NOTE — ED Triage Notes (Signed)
Pt c/o cough & wheezing x3 days. Hx of COPD.

## 2023-08-15 NOTE — ED Provider Notes (Signed)
[facility]    CSN: [id number] Arrival date & time: 08/15/23  4098      History   Chief Complaint Chief Complaint  Patient presents with   Cough   Wheezing    HPI Katherine Carter is a 72 y.o. female  presents for evaluation of URI symptoms for 3 days. Patient reports associated symptoms of cough, congestion, sore throat, shortness of breath and wheezing. Denies N/V/D, fevers, ear pain, body aches.  Patient has a history of COPD and does endorse increased sputum production and shortness of breath.  Has been using her inhalers and nebulizers with temporary improvement.  Does report she was hospitalized for COPD for about 3 days last year.  Reports no sick contacts.  Pt has taken nothing OTC for symptoms. Pt has no other concerns at this time.    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   Ileus (HCC) 09/24/2022   Acute on chronic respiratory failure with hypoxia (HCC) 09/21/2022   Pseudohyponatremia 06/06/2022   E-coli UTI    Uncontrolled type 2 diabetes mellitus with hyperglycemia, without long-term current use of insulin (HCC) 06/05/2022   COVID-19 virus infection 06/05/2022   Hyperkalemia 06/04/2022   AKI (acute kidney injury) (HCC) 06/04/2022   Acute bilateral lower abdominal pain 06/04/2022   Pure hypercholesterolemia    Elevated troponin    Acute respiratory failure with hypoxemia (HCC) 09/02/2021   Sepsis due to pneumonia (HCC)  07/08/2018   CAP (community acquired pneumonia) 07/08/2018   HTN (hypertension) 07/08/2018   COPD with acute exacerbation (HCC) 07/08/2018   Acute respiratory failure with hypoxia and hypercapnia (HCC) 12/10/2015    Past Surgical History:  Procedure Laterality Date   ABDOMINAL HYSTERECTOMY     COLONOSCOPY WITH PROPOFOL N/A 01/23/2017   Procedure: COLONOSCOPY WITH PROPOFOL;  Surgeon: Christena Deem, MD;  Location: [facility] ENDOSCOPY;  Service: Endoscopy;  Laterality: N/A;   FOOT SURGERY      OB History   No obstetric history on file.      Home Medications    Prior to Admission medications   Medication Sig Start Date End Date Taking? Authorizing Provider  albuterol (VENTOLIN HFA) 108 (90 Base) MCG/ACT inhaler Inhale 2 puffs into the lungs every 4 (four) hours as needed for wheezing or shortness of breath.   Yes [provider]  amLODipine (NORVASC) 5 MG tablet Take 1 tablet (5 mg total) by mouth daily. 09/05/21  Yes Kathlen Mody, MD  aspirin EC 81 MG tablet Take 81 mg by mouth daily. 03/15/22  Yes [provider]  azithromycin (ZITHROMAX) 250 MG tablet Take 1 tablet (250 mg total) by mouth daily. Take first 2 tablets together, then 1 every day until finished. 08/15/23  Yes Radford Pax, NP  budesonide-formoterol [facility]) 160-4.5 MCG/ACT inhaler Inhale 2 puffs into the lungs 2 (two) times daily.   Yes [provider]  Dextromethorphan-guaiFENesin [facility] DM) 30-600 MG TB12 SMARTSIG:1-2 Tablet(s) By Mouth Every 12 Hours PRN 04/28/22  Yes [provider]  fluticasone (FLONASE) 50 MCG/ACT nasal spray Place 1 spray into both nostrils daily. 09/06/22  Yes [provider]  ipratropium (ATROVENT) 0.06 % nasal spray Place 2 sprays into both nostrils 4 (four) times daily. 05/28/22  Yes Becky Augusta, NP  ipratropium-albuterol (DUONEB) 0.5-2.5 (3) MG/3ML SOLN Take 3 mLs by nebulization every 4 (four) hours as needed. Patient taking differently: Take 3  mLs by nebulization every 4 (four) hours as needed. Last dose: 730 am. 09/05/21  Yes Kathlen Mody, MD  JARDIANCE 10 MG TABS tablet Take 10 mg by mouth daily. 09/07/21  Yes [provider]  latanoprost (XALATAN) 0.005 % ophthalmic solution Place 1 drop into both eyes 2 (two) times daily. 08/04/22  Yes [provider]  metFORMIN (GLUCOPHAGE-XR) 500 MG 24 hr tablet Take 500 mg by mouth daily with breakfast.   Yes [provider]  methocarbamol (ROBAXIN) 500 MG tablet Take 500 mg by mouth every 6 (six) hours as needed for muscle spasms. 06/21/21  Yes [provider]  nitrofurantoin, macrocrystal-monohydrate, (MACROBID) 100 MG capsule Take 100 mg by mouth 2 (two) times daily.   Yes [provider]  ondansetron (ZOFRAN) 4 MG tablet Take 4 mg by mouth every 8 (eight) hours as needed for nausea or vomiting. 07/04/22  Yes [provider]  predniSONE (DELTASONE) 20 MG tablet Take 2 tablets (40 mg total) by mouth daily with breakfast for 5 days. 08/15/23 08/20/23 Yes Radford Pax, NP  promethazine-dextromethorphan (PROMETHAZINE-DM) 6.25-15 MG/5ML syrup Take 2.5 mLs by mouth 3 (three) times daily as needed for cough. 08/15/23  Yes Radford Pax, NP  Respiratory Therapy Supplies (ADULT MASK LARGE) MISC See admin instructions. use with inhaler 10/17/21  Yes [provider]  Spacer/Aero-Holding Chambers (AEROCHAMBER PLUS) inhaler Use with inhaler 10/15/21  Yes Domenick Gong, MD  VICTOZA 18 MG/3ML SOPN Inject 0.6 mg into the skin daily.   Yes [provider]  VOLTAREN ARTHRITIS PAIN 1 % GEL Apply 2 g topically 4 (four) times daily. 03/22/22  Yes [provider]  XOPENEX HFA 45 MCG/ACT inhaler Inhale 1 puff into the lungs 3 (three) times daily. 11/02/21  Yes [provider]  benzonatate (TESSALON) 200 MG capsule Take 1 capsule (200 mg total) by mouth 3 (three) times daily as needed for cough. 05/05/23   Lorre Munroe, NP   ondansetron (ZOFRAN-ODT) 4 MG disintegrating tablet Take 1 tablet (4 mg total) by mouth every 8 (eight) hours as needed. 09/01/22   Katha Cabal, DO    Family History Family History  Problem Relation Age of Onset   CAD Mother    CAD Father    Breast cancer Neg Hx     Social History Social History   Tobacco Use   Smoking status: Former    Current packs/day: 0.00    Average packs/day: 0.5 packs/day for 25.0 years (12.5 ttl pk-yrs)    Types: Cigarettes    Start date: 07/06/1989    Quit date: 07/06/2014    Years since quitting: 9.1   Smokeless tobacco: Never  Vaping Use   Vaping status: Never Used  Substance Use Topics   Alcohol use: No    Alcohol/week: 0.0 standard drinks of alcohol   Drug use: No     Allergies   Penicillins   Review of Systems Review of Systems  HENT:  Positive for congestion and sore throat.   Respiratory:  Positive for cough, shortness of breath and wheezing.  Physical Exam Triage Vital Signs ED Triage Vitals  Encounter Vitals Group     BP 08/15/23 0835 (!) 149/80     Systolic BP Percentile --      Diastolic BP Percentile --      Pulse Rate 08/15/23 0835 95     Resp 08/15/23 0835 16     Temp 08/15/23 0835 98.6 F (37 C)     Temp Source 08/15/23 0835 Oral     SpO2 08/15/23 0835 94 %     Weight 08/15/23 0833 170 lb (77.1 kg)     Height 08/15/23 0833 5\' 3"  (1.6 m)     Head Circumference --      Peak Flow --      Pain Score 08/15/23 0838 0     Pain Loc --      Pain Education --      Exclude from Growth Chart --    No data found.  Updated Vital Signs BP (!) 149/80 (BP Location: Left Arm)   Pulse 95   Temp 98.6 F (37 C) (Oral)   Resp 16   Ht 5\' 3"  (1.6 m)   Wt 170 lb (77.1 kg)   SpO2 94%   BMI 30.11 kg/m   Visual Acuity Right Eye Distance:   Left Eye Distance:   Bilateral Distance:    Right Eye Near:   Left Eye Near:    Bilateral Near:     Physical Exam Vitals and nursing note reviewed.  Constitutional:       General: She is not in acute distress.    Appearance: She is well-developed. She is not ill-appearing.  HENT:     Head: Normocephalic and atraumatic.     Right Ear: Tympanic membrane and ear canal normal.     Left Ear: Tympanic membrane and ear canal normal.     Nose: Congestion present.     Mouth/Throat:     Mouth: Mucous membranes are moist.     Pharynx: Oropharynx is clear. Uvula midline. Posterior oropharyngeal erythema present.     Tonsils: No tonsillar exudate or tonsillar abscesses.  Eyes:     Conjunctiva/sclera: Conjunctivae normal.     Pupils: Pupils are equal, round, and reactive to light.  Cardiovascular:     Rate and Rhythm: Normal rate and regular rhythm.     Heart sounds: Normal heart sounds.  Pulmonary:     Effort: Pulmonary effort is normal.     Breath sounds: Wheezing present.     Comments: Mild expiratory wheeze bilateral bases Musculoskeletal:     Cervical back: Normal range of motion and neck supple.  Lymphadenopathy:     Cervical: No cervical adenopathy.  Skin:    General: Skin is warm and dry.  Neurological:     General: No focal deficit present.     Mental Status: She is alert and oriented to person, place, and time.  Psychiatric:        Mood and Affect: Mood normal.        Behavior: Behavior normal.      UC Treatments / Results  Labs (all labs ordered are listed, but only abnormal results are displayed) Labs Reviewed  GROUP A STREP BY PCR  RESP PANEL BY RT-PCR (FLU A&B, COVID) ARPGX2    EKG   Radiology No results found.  Procedures Procedures (including critical care time)  Medications Ordered in UC Medications  ipratropium-albuterol (DUONEB) 0.5-2.5 (3) MG/3ML nebulizer solution 3 mL (3 mLs Nebulization Given 08/15/23 0928)  Initial Impression / Assessment and Plan / UC Course  I have reviewed the triage vital signs and the nursing notes.  Pertinent labs & imaging results that were available during my care of the patient were  reviewed by me and considered in my medical decision making (see chart for details).     Reviewed exam and symptoms with patient.  No red flags.  Negative strep PCR.  COVID and flu PCR pending will contact for any positive results.  Wet read of chest x-ray without obvious consolidation and will also contact for any positive results based on radiology overread.  Will treat for COPD exacerbation with Zithromax, prednisone, Promethazine DM.  Patient has allergy to penicillin and reports Zithromax has worked well for her in the past.  Advised PCP or pulmonology follow-up 2 days for recheck.  Strict ER precautions reviewed and patient verbalized understanding. Final Clinical Impressions(s) / UC Diagnoses   Final diagnoses:  Shortness of breath  Sore throat  COPD exacerbation (HCC)     Discharge Instructions      Start Zithromax antibiotic as prescribed.  Start prednisone daily for 5 days.  Promethazine DM as needed for cough.  Please of this medication, get drowsy.  Do not drink alcohol or drive on this medication.  Continue your inhalers as needed.  Lots of rest and fluids.  Please follow-up with your PCP or pulmonologist in 2 days for recheck.  Please go to the ER if you develop any worsening symptoms.  I hope you feel better soon!     ED Prescriptions     Medication Sig Dispense Auth. Provider   predniSONE (DELTASONE) 20 MG tablet Take 2 tablets (40 mg total) by mouth daily with breakfast for 5 days. 10 tablet Radford Pax, NP   azithromycin (ZITHROMAX) 250 MG tablet Take 1 tablet (250 mg total) by mouth daily. Take first 2 tablets together, then 1 every day until finished. 6 tablet Radford Pax, NP   promethazine-dextromethorphan (PROMETHAZINE-DM) 6.25-15 MG/5ML syrup Take 2.5 mLs by mouth 3 (three) times daily as needed for cough. 118 mL Radford Pax, NP      PDMP not reviewed this encounter.   Radford Pax, NP 08/15/23 1019

## 2023-08-15 NOTE — Discharge Instructions (Signed)
Start Zithromax antibiotic as prescribed.  Start prednisone daily for 5 days.  Promethazine DM as needed for cough.  Please of this medication, get drowsy.  Do not drink alcohol or drive on this medication.  Continue your inhalers as needed.  Lots of rest and fluids.  Please follow-up with your PCP or pulmonologist in 2 days for recheck.  Please go to the ER if you develop any worsening symptoms.  I hope you feel better soon!

## 2023-11-04 ENCOUNTER — Ambulatory Visit
Admission: EM | Admit: 2023-11-04 | Discharge: 2023-11-04 | Disposition: A | Discharge status: Home / Self Care | Attending: Physician Assistant | Admitting: Physician Assistant

## 2023-11-04 DIAGNOSIS — R0981 Nasal congestion: Secondary | ICD-10-CM | POA: Insufficient documentation

## 2023-11-04 DIAGNOSIS — R519 Headache, unspecified: Secondary | ICD-10-CM | POA: Insufficient documentation

## 2023-11-04 DIAGNOSIS — R5383 Other fatigue: Secondary | ICD-10-CM | POA: Diagnosis present

## 2023-11-04 DIAGNOSIS — J069 Acute upper respiratory infection, unspecified: Secondary | ICD-10-CM | POA: Diagnosis not present

## 2023-11-04 DIAGNOSIS — R110 Nausea: Secondary | ICD-10-CM | POA: Insufficient documentation

## 2023-11-04 DIAGNOSIS — R197 Diarrhea, unspecified: Secondary | ICD-10-CM | POA: Insufficient documentation

## 2023-11-04 DIAGNOSIS — J449 Chronic obstructive pulmonary disease, unspecified: Secondary | ICD-10-CM | POA: Diagnosis present

## 2023-11-04 LAB — RESP PANEL BY RT-PCR (RSV, FLU A&B, COVID)  RVPGX2
Influenza A by PCR: NEGATIVE
Influenza B by PCR: NEGATIVE
Resp Syncytial Virus by PCR: NEGATIVE
SARS Coronavirus 2 by RT PCR: NEGATIVE

## 2023-11-04 LAB — GROUP A STREP BY PCR: Group A Strep by PCR: NOT DETECTED

## 2023-11-04 MED ORDER — PROMETHAZINE-DM 6.25-15 MG/5ML PO SYRP: 5.0000 mL | ORAL_SOLUTION | Freq: Four times a day (QID) | ORAL | 0 refills | Status: DC | PRN

## 2023-11-04 MED ORDER — IPRATROPIUM BROMIDE 0.06 % NA SOLN: 2.0000 | Freq: Four times a day (QID) | NASAL | 0 refills | Status: AC

## 2023-11-04 MED ORDER — ONDANSETRON 4 MG PO TBDP: 4.0000 mg | ORAL_TABLET | Freq: Three times a day (TID) | ORAL | 0 refills | Status: AC | PRN

## 2023-11-04 NOTE — ED Triage Notes (Signed)
 Pt c/o headache, sore throat x4days  Pt states that she began to cough last night and she is having continued headaches from Thursday along the sides and back of her head.

## 2023-11-04 NOTE — ED Provider Notes (Signed)
 [facility]    CSN: [id number] Arrival date & time: 11/04/23  1307      History   Chief Complaint Chief Complaint  Patient presents with   Headache    HPI Katherine Carter is a 73 y.o. female with history of COPD, diabetes and hypertension.  Patient is presenting today for 2-day history of fatigue, cough, nasal congestion, bilateral ear fullness, headaches, abdominal cramping, nausea and diarrhea.  Denies fever, sinus pain, chest pain, or increased breathing trouble. Husband has been ill with similar symptoms and was diagnosed with a sinus infection.  She denies any known COVID/flu exposure.  She is vaccinated x3 for COVID-19.  No other complaints.  Has not taken any OTC meds for symptoms.   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   Ileus (HCC) 09/24/2022   Acute on chronic respiratory failure with hypoxia (HCC) 09/21/2022   Pseudohyponatremia 06/06/2022   E-coli UTI    Uncontrolled type 2 diabetes mellitus with hyperglycemia, without long-term current use of insulin (HCC) 06/05/2022   COVID-19 virus infection 06/05/2022   Hyperkalemia 06/04/2022   AKI (acute kidney injury) (HCC) 06/04/2022   Acute bilateral lower abdominal pain 06/04/2022   Pure hypercholesterolemia    Elevated troponin    Acute respiratory failure with hypoxemia (HCC) 09/02/2021   Sepsis due to pneumonia (HCC) 07/08/2018   CAP (community acquired pneumonia) 07/08/2018   HTN (hypertension) 07/08/2018   COPD with acute exacerbation (HCC) 07/08/2018   Acute respiratory failure with hypoxia and hypercapnia  (HCC) 12/10/2015    Past Surgical History:  Procedure Laterality Date   ABDOMINAL HYSTERECTOMY     COLONOSCOPY WITH PROPOFOL N/A 01/23/2017   Procedure: COLONOSCOPY WITH PROPOFOL;  Surgeon: Christena Deem, MD;  Location: [facility] ENDOSCOPY;  Service: Endoscopy;  Laterality: N/A;   FOOT SURGERY      OB History   No obstetric history on file.      Home Medications    Prior to Admission medications   Medication Sig Start Date End Date Taking? Authorizing Provider  albuterol (VENTOLIN HFA) 108 (90 Base) MCG/ACT inhaler Inhale 2 puffs into the lungs every 4 (four) hours as needed for wheezing or shortness of breath.   Yes [provider]  amLODipine (NORVASC) 5 MG tablet Take 1 tablet (5 mg total) by mouth daily. 09/05/21  Yes Kathlen Mody, MD  aspirin EC 81 MG tablet Take 81 mg by mouth daily. 03/15/22  Yes [provider]  budesonide-formoterol (SYMBICORT) 160-4.5 MCG/ACT inhaler Inhale 2 puffs into the lungs 2 (two) times daily.   Yes [provider]  ipratropium (ATROVENT) 0.06 % nasal spray Place 2 sprays into both nostrils 4 (four) times daily. 11/04/23  Yes Eusebio Friendly B, PA-C  ipratropium-albuterol (DUONEB) 0.5-2.5 (3) MG/3ML SOLN Take 3 mLs by nebulization every 4 (four) hours as needed. Patient taking differently: Take 3 mLs by nebulization every 4 (four) hours as needed. Last dose: 730 am. 09/05/21  Yes Kathlen Mody, MD  JARDIANCE 10 MG TABS tablet Take 10 mg by mouth daily. 09/07/21  Yes [provider]  latanoprost (XALATAN) 0.005 % ophthalmic solution  Place 1 drop into both eyes 2 (two) times daily. 08/04/22  Yes [provider]  metFORMIN (GLUCOPHAGE-XR) 500 MG 24 hr tablet Take 500 mg by mouth daily with breakfast.   Yes [provider]  ondansetron (ZOFRAN) 4 MG tablet Take 4 mg by mouth every 8 (eight) hours as needed for nausea or vomiting. 07/04/22  Yes [provider]  ondansetron (ZOFRAN-ODT) 4 MG disintegrating  tablet Take 1 tablet (4 mg total) by mouth every 8 (eight) hours as needed. 11/04/23  Yes Shirlee Latch, PA-C  promethazine-dextromethorphan (PROMETHAZINE-DM) 6.25-15 MG/5ML syrup Take 5 mLs by mouth 4 (four) times daily as needed. 11/04/23  Yes Shirlee Latch, PA-C  Respiratory Therapy Supplies (ADULT MASK LARGE) MISC See admin instructions. use with inhaler 10/17/21  Yes [provider]  Spacer/Aero-Holding Chambers (AEROCHAMBER PLUS) inhaler Use with inhaler 10/15/21  Yes Domenick Gong, MD  VICTOZA 18 MG/3ML SOPN Inject 0.6 mg into the skin daily.   Yes [provider]  XOPENEX HFA 45 MCG/ACT inhaler Inhale 1 puff into the lungs 3 (three) times daily. 11/02/21  Yes [provider]  fluticasone (FLONASE) 50 MCG/ACT nasal spray Place 1 spray into both nostrils daily. 09/06/22   [provider]  methocarbamol (ROBAXIN) 500 MG tablet Take 500 mg by mouth every 6 (six) hours as needed for muscle spasms. 06/21/21   [provider]  VOLTAREN ARTHRITIS PAIN 1 % GEL Apply 2 g topically 4 (four) times daily. 03/22/22   [provider]    Family History Family History  Problem Relation Age of Onset   CAD Mother    CAD Father    Breast cancer Neg Hx     Social History Social History   Tobacco Use   Smoking status: Former    Current packs/day: 0.00    Average packs/day: 0.5 packs/day for 25.0 years (12.5 ttl pk-yrs)    Types: Cigarettes    Start date: 07/06/1989    Quit date: 07/06/2014    Years since quitting: 9.3   Smokeless tobacco: Never  Vaping Use   Vaping status: Never Used  Substance Use Topics   Alcohol use: No    Alcohol/week: 0.0 standard drinks of alcohol   Drug use: No     Allergies   Penicillins   Review of Systems Review of Systems  Constitutional:  Positive for fatigue. Negative for chills, diaphoresis and fever.  HENT:  Positive for congestion, rhinorrhea and sore throat. Negative for ear pain, sinus pressure and  sinus pain.   Respiratory:  Positive for cough. Negative for shortness of breath and wheezing.   Cardiovascular:  Negative for chest pain.  Gastrointestinal:  Positive for abdominal pain, diarrhea and nausea. Negative for vomiting.  Musculoskeletal:  Negative for arthralgias and myalgias.  Skin:  Negative for rash.  Neurological:  Positive for headaches. Negative for weakness.  Hematological:  Negative for adenopathy.     Physical Exam Triage Vital Signs ED Triage Vitals  Enc Vitals Group     BP 05/25/21 1327 118/87     Pulse Rate 05/25/21 1327 84     Resp 05/25/21 1327 18     Temp 05/25/21 1327 98.7 F (37.1 C)     Temp Source 05/25/21 1327 Oral     SpO2 05/25/21 1327 100 %     Weight 05/25/21 1324 177 lb (80.3 kg)     Height 05/25/21 1324 5\' 2"  (1.575 m)     Head Circumference --  Peak Flow --      Pain Score 05/25/21 1324 0     Pain Loc --      Pain Edu? --      Excl. in GC? --    No data found.  Updated Vital Signs BP (P) 129/66 (BP Location: Left Arm)   Pulse (P) 86   Temp 98.8 F (37.1 C) (Oral)   Ht 5\' 3"  (1.6 m)   Wt 178 lb (80.7 kg)   SpO2 (P) 97%   BMI 31.53 kg/m      Physical Exam Vitals and nursing note reviewed.  Constitutional:      General: She is not in acute distress.    Appearance: Normal appearance. She is not ill-appearing or toxic-appearing.  HENT:     Head: Normocephalic and atraumatic.     Right Ear: Tympanic membrane, ear canal and external ear normal.     Left Ear: Tympanic membrane, ear canal and external ear normal.     Nose: Congestion present.     Mouth/Throat:     Mouth: Mucous membranes are moist.     Pharynx: Oropharynx is clear. Posterior oropharyngeal erythema present.  Eyes:     General: No scleral icterus.       Right eye: No discharge.        Left eye: No discharge.     Conjunctiva/sclera: Conjunctivae normal.  Cardiovascular:     Rate and Rhythm: Normal rate and regular rhythm.     Heart sounds: Normal heart  sounds.  Pulmonary:     Effort: Pulmonary effort is normal. No respiratory distress.     Breath sounds: No wheezing, rhonchi or rales.  Abdominal:     Tenderness: There is abdominal tenderness (mild, generalized).  Musculoskeletal:     Cervical back: Neck supple.  Skin:    General: Skin is dry.  Neurological:     General: No focal deficit present.     Mental Status: She is alert. Mental status is at baseline.     Motor: No weakness.     Gait: Gait normal.  Psychiatric:        Mood and Affect: Mood normal.        Behavior: Behavior normal.      UC Treatments / Results  Labs (all labs ordered are listed, but only abnormal results are displayed) Labs Reviewed  GROUP A STREP BY PCR  RESP PANEL BY RT-PCR (RSV, FLU A&B, COVID)  [id number]    EKG   Radiology No results found.  Procedures Procedures (including critical care time)  Medications Ordered in UC Medications - No data to display  Initial Impression / Assessment and Plan / UC Course  I have reviewed the triage vital signs and the nursing notes.  Pertinent labs & imaging results that were available during my care of the patient were reviewed by me and considered in my medical decision making (see chart for details).  73 year old female presenting for 2-day history of fatigue, dry cough, congestion, headaches, sore throat, bilateral ear pressure, abdominal cramping, nausea and diarrhea. Husband is also ill. She denies fever, productive cough or SOB/wheezing.   Vitals are normal and stable. She is mildly ill appearing.  Exam significant for nasal congestion,erythema of posterior pharynx and mild abdominal generalized tenderness. Chest is CTA.    Resp panel and strep test obtained.  All negative.  Reviewed results with patient.  Viral URI.  Supportive care encouraged with increasing rest and fluids.  Sent Promethazine DM and  Atrovent nasal spray as well as Zofran to pharmacy.  Encouraged use of Tylenol for headache.   Advised if she develops a fever or breathing problems she is to seek reevaluation.  Advised if cough becomes productive and she has any breathing issues, COPD may be flared up and she should be reevaluated.  Thoroughly reviewed return and ER precautions.  Final Clinical Impressions(s) / UC Diagnoses   Final diagnoses:  Viral upper respiratory tract infection  Acute nonintractable headache, unspecified headache type  Nasal congestion  Nausea without vomiting  Diarrhea, unspecified type  Other fatigue  Chronic obstructive pulmonary disease, unspecified COPD type (HCC)     Discharge Instructions      -You are negative for COVID, flu, RSV and strep. - You have a viral illness.  Supportive care is encouraged and increasing rest and fluids.  Make sure you are drinking plenty of fluids since you have had the diarrhea.  I sent Zofran as needed for nausea.  I also sent cough medicine. - If your cough becomes productive and you start having increased shortness of breath from baseline you may be having a flareup of your COPD and you should seek reevaluation.  Use inhalers as needed. - If you develop a fever or acute worsening of symptoms please return.      ED Prescriptions     Medication Sig Dispense Auth. Provider   promethazine-dextromethorphan (PROMETHAZINE-DM) 6.25-15 MG/5ML syrup Take 5 mLs by mouth 4 (four) times daily as needed. 118 mL Eusebio Friendly B, PA-C   ondansetron (ZOFRAN-ODT) 4 MG disintegrating tablet Take 1 tablet (4 mg total) by mouth every 8 (eight) hours as needed. 15 tablet Eusebio Friendly B, PA-C   ipratropium (ATROVENT) 0.06 % nasal spray Place 2 sprays into both nostrils 4 (four) times daily. 15 mL Shirlee Latch, PA-C      PDMP not reviewed this encounter.      Shirlee Latch, PA-C 11/04/23 1526

## 2023-11-04 NOTE — Discharge Instructions (Signed)
-  You are negative for COVID, flu, RSV and strep. - You have a viral illness.  Supportive care is encouraged and increasing rest and fluids.  Make sure you are drinking plenty of fluids since you have had the diarrhea.  I sent Zofran as needed for nausea.  I also sent cough medicine. - If your cough becomes productive and you start having increased shortness of breath from baseline you may be having a flareup of your COPD and you should seek reevaluation.  Use inhalers as needed. - If you develop a fever or acute worsening of symptoms please return.

## 2023-12-28 ENCOUNTER — Other Ambulatory Visit: Payer: Self-pay | Admitting: Family Medicine

## 2023-12-28 DIAGNOSIS — Z1231 Encounter for screening mammogram for malignant neoplasm of breast: Secondary | ICD-10-CM

## 2023-12-28 DIAGNOSIS — Z780 Asymptomatic menopausal state: Secondary | ICD-10-CM

## 2024-01-07 ENCOUNTER — Other Ambulatory Visit: Payer: Self-pay

## 2024-01-07 ENCOUNTER — Telehealth: Payer: Self-pay

## 2024-01-07 DIAGNOSIS — Z1211 Encounter for screening for malignant neoplasm of colon: Secondary | ICD-10-CM

## 2024-01-07 MED ORDER — PEG 3350-KCL-NA BICARB-NACL 420 G PO SOLR
4000.0000 mL | Freq: Once | ORAL | 0 refills | Status: AC
Start: 2024-01-07 — End: 2024-01-07

## 2024-01-07 NOTE — Telephone Encounter (Signed)
 Gastroenterology Pre-Procedure Review  Request Date: 01/29/24 Requesting Physician: Dr. Ole Berkeley  PATIENT REVIEW QUESTIONS: The patient responded to the following health history questions as indicated:    1. Are you having any GI issues? no 2. Do you have a personal history of Polyps? no 3. Do you have a family history of Colon Cancer or Polyps? no 4. Diabetes Mellitus? yes (pt takes jardiance advised to stop 3 days prior to colonoscopy, Metformin  has been advised to stop 2 days prior to colonoscopy she also takes Victoza but its 1xweek) 5. Joint replacements in the past 12 months?no 6. Major health problems in the past 3 months?no 7. Any artificial heart valves, MVP, or defibrillator?no    MEDICATIONS & ALLERGIES:    Patient reports the following regarding taking any anticoagulation/antiplatelet therapy:   Plavix, Coumadin, Eliquis, Xarelto, Lovenox , Pradaxa, Brilinta, or Effient? no Aspirin ? no  Patient confirms/reports the following medications:  Current Outpatient Medications  Medication Sig Dispense Refill   albuterol  (VENTOLIN  HFA) 108 (90 Base) MCG/ACT inhaler Inhale 2 puffs into the lungs every 4 (four) hours as needed for wheezing or shortness of breath.     amLODipine  (NORVASC ) 5 MG tablet Take 1 tablet (5 mg total) by mouth daily. 30 tablet 0   aspirin  EC 81 MG tablet Take 81 mg by mouth daily.     budesonide -formoterol  (SYMBICORT) 160-4.5 MCG/ACT inhaler Inhale 2 puffs into the lungs 2 (two) times daily.     fluticasone  (FLONASE ) 50 MCG/ACT nasal spray Place 1 spray into both nostrils daily.     ipratropium (ATROVENT ) 0.06 % nasal spray Place 2 sprays into both nostrils 4 (four) times daily. 15 mL 0   ipratropium-albuterol  (DUONEB) 0.5-2.5 (3) MG/3ML SOLN Take 3 mLs by nebulization every 4 (four) hours as needed. (Patient taking differently: Take 3 mLs by nebulization every 4 (four) hours as needed. Last dose: 730 am.) 360 mL 3   JARDIANCE 10 MG TABS tablet Take 10 mg by mouth  daily.     latanoprost  (XALATAN ) 0.005 % ophthalmic solution Place 1 drop into both eyes 2 (two) times daily.     metFORMIN  (GLUCOPHAGE -XR) 500 MG 24 hr tablet Take 500 mg by mouth daily with breakfast.     methocarbamol  (ROBAXIN ) 500 MG tablet Take 500 mg by mouth every 6 (six) hours as needed for muscle spasms.     ondansetron  (ZOFRAN ) 4 MG tablet Take 4 mg by mouth every 8 (eight) hours as needed for nausea or vomiting.     ondansetron  (ZOFRAN -ODT) 4 MG disintegrating tablet Take 1 tablet (4 mg total) by mouth every 8 (eight) hours as needed. 15 tablet 0   promethazine -dextromethorphan  (PROMETHAZINE -DM) 6.25-15 MG/5ML syrup Take 5 mLs by mouth 4 (four) times daily as needed. 118 mL 0   Respiratory Therapy Supplies (ADULT MASK LARGE) MISC See admin instructions. use with inhaler     Spacer/Aero-Holding Chambers (AEROCHAMBER PLUS) inhaler Use with inhaler 1 each 2   VICTOZA 18 MG/3ML SOPN Inject 0.6 mg into the skin daily.     VOLTAREN  ARTHRITIS PAIN 1 % GEL Apply 2 g topically 4 (four) times daily.     XOPENEX  HFA 45 MCG/ACT inhaler Inhale 1 puff into the lungs 3 (three) times daily.     No current facility-administered medications for this visit.    Patient confirms/reports the following allergies:  Allergies  Allergen Reactions   Penicillins Hives and Other (See Comments)    Has patient had a PCN reaction causing immediate rash, facial/tongue/throat swelling,  SOB or lightheadedness with hypotension: No Has patient had a PCN reaction causing severe rash involving mucus membranes or skin necrosis: No Has patient had a PCN reaction that required hospitalization: No Has patient had a PCN reaction occurring within the last 10 years: No If all of the above answers are "NO", then may proceed with Cephalosporin use.    No orders of the defined types were placed in this encounter.   AUTHORIZATION INFORMATION Primary Insurance: 1D#: Group #:  Secondary Insurance: 1D#: Group  #:  SCHEDULE INFORMATION: Date: 01/29/24 Time: Location: [facility]

## 2024-01-29 ENCOUNTER — Ambulatory Visit: Admitting: Certified Registered"

## 2024-01-29 ENCOUNTER — Other Ambulatory Visit: Payer: Self-pay

## 2024-01-29 ENCOUNTER — Encounter
Admission: RE | Disposition: A | Discharge status: Home / Self Care | Payer: Self-pay | Source: Home / Self Care | Attending: Gastroenterology

## 2024-01-29 ENCOUNTER — Ambulatory Visit
Admission: RE | Admit: 2024-01-29 | Discharge: 2024-01-29 | Disposition: A | Discharge status: Home / Self Care | Attending: Gastroenterology | Admitting: Gastroenterology

## 2024-01-29 ENCOUNTER — Encounter: Payer: Self-pay | Admitting: Gastroenterology

## 2024-01-29 DIAGNOSIS — Z9981 Dependence on supplemental oxygen: Secondary | ICD-10-CM | POA: Diagnosis not present

## 2024-01-29 DIAGNOSIS — K5730 Diverticulosis of large intestine without perforation or abscess without bleeding: Secondary | ICD-10-CM | POA: Insufficient documentation

## 2024-01-29 DIAGNOSIS — Z7985 Long-term (current) use of injectable non-insulin antidiabetic drugs: Secondary | ICD-10-CM | POA: Insufficient documentation

## 2024-01-29 DIAGNOSIS — Z7951 Long term (current) use of inhaled steroids: Secondary | ICD-10-CM | POA: Insufficient documentation

## 2024-01-29 DIAGNOSIS — Z7984 Long term (current) use of oral hypoglycemic drugs: Secondary | ICD-10-CM | POA: Insufficient documentation

## 2024-01-29 DIAGNOSIS — E119 Type 2 diabetes mellitus without complications: Secondary | ICD-10-CM | POA: Insufficient documentation

## 2024-01-29 DIAGNOSIS — D120 Benign neoplasm of cecum: Secondary | ICD-10-CM | POA: Insufficient documentation

## 2024-01-29 DIAGNOSIS — I10 Essential (primary) hypertension: Secondary | ICD-10-CM | POA: Diagnosis not present

## 2024-01-29 DIAGNOSIS — J449 Chronic obstructive pulmonary disease, unspecified: Secondary | ICD-10-CM | POA: Diagnosis not present

## 2024-01-29 DIAGNOSIS — Z1211 Encounter for screening for malignant neoplasm of colon: Secondary | ICD-10-CM | POA: Insufficient documentation

## 2024-01-29 DIAGNOSIS — K635 Polyp of colon: Secondary | ICD-10-CM

## 2024-01-29 HISTORY — PX: COLONOSCOPY: SHX5424

## 2024-01-29 HISTORY — PX: POLYPECTOMY: SHX149

## 2024-01-29 LAB — GLUCOSE, CAPILLARY: Glucose-Capillary: 127 mg/dL — ABNORMAL HIGH (ref 70–99)

## 2024-01-29 SURGERY — COLONOSCOPY
Anesthesia: General

## 2024-01-29 MED ORDER — PROPOFOL 10 MG/ML IV BOLUS
INTRAVENOUS | Status: DC | PRN
Start: 2024-01-29 — End: 2024-01-29
  Administered 2024-01-29: 60 mg via INTRAVENOUS

## 2024-01-29 MED ORDER — PROPOFOL 500 MG/50ML IV EMUL
INTRAVENOUS | Status: DC | PRN
  Administered 2024-01-29: 150 ug/kg/min via INTRAVENOUS

## 2024-01-29 MED ORDER — LIDOCAINE HCL (CARDIAC) PF 100 MG/5ML IV SOSY
PREFILLED_SYRINGE | INTRAVENOUS | Status: DC | PRN
  Administered 2024-01-29: 60 mg via INTRAVENOUS

## 2024-01-29 MED ORDER — SODIUM CHLORIDE 0.9 % IV SOLN: INTRAVENOUS | Status: DC

## 2024-01-29 NOTE — H&P (Signed)
 Marnee Sink, MD [facility] [address], [address] [place], [place] [address] Phone: [phone] Fax : [phone]  Primary Care Physician:  Lorina Roosevelt, MD Primary Gastroenterologist:  Dr. Ole Berkeley  Pre-Procedure History & Physical: HPI:  Katherine Carter is a 73 y.o. female is here for a screening colonoscopy.   Past Medical History:  Diagnosis Date   COPD (chronic obstructive pulmonary disease) (HCC)    Diabetes mellitus without complication (HCC)    GERD (gastroesophageal reflux disease)    Hypertension     Past Surgical History:  Procedure Laterality Date   ABDOMINAL HYSTERECTOMY     COLONOSCOPY WITH PROPOFOL  N/A 01/23/2017   Procedure: COLONOSCOPY WITH PROPOFOL ;  Surgeon: Deveron Fly, MD;  Location: [facility] ENDOSCOPY;  Service: Endoscopy;  Laterality: N/A;   FOOT SURGERY      Prior to Admission medications   Medication Sig Start Date End Date Taking? Authorizing Provider  JARDIANCE 10 MG TABS tablet Take 10 mg by mouth daily. 09/07/21  Yes [provider]  metFORMIN  (GLUCOPHAGE -XR) 500 MG 24 hr tablet Take 500 mg by mouth daily with breakfast.   Yes [provider]  XOPENEX  HFA 45 MCG/ACT inhaler Inhale 1 puff into the lungs 3 (three) times daily. 11/02/21  Yes [provider]  albuterol  (VENTOLIN  HFA) 108 (90 Base) MCG/ACT inhaler Inhale 2 puffs into the lungs every 4 (four) hours as needed for wheezing or shortness of breath.    [provider]  amLODipine  (NORVASC ) 5 MG tablet Take 1 tablet (5 mg total) by mouth daily. 09/05/21   Akula, Vijaya, MD  aspirin  EC 81 MG tablet Take 81 mg by mouth daily. 03/15/22   [provider]  budesonide -formoterol  (SYMBICORT) 160-4.5 MCG/ACT inhaler Inhale 2 puffs into the lungs 2 (two) times daily.    [provider]  fluticasone  (FLONASE ) 50 MCG/ACT nasal spray Place 1 spray into both nostrils daily. 09/06/22   [provider]  ipratropium (ATROVENT ) 0.06 % nasal spray Place  2 sprays into both nostrils 4 (four) times daily. 11/04/23   Floydene Hy, PA-C  ipratropium-albuterol  (DUONEB) 0.5-2.5 (3) MG/3ML SOLN Take 3 mLs by nebulization every 4 (four) hours as needed. Patient taking differently: Take 3 mLs by nebulization every 4 (four) hours as needed. Last dose: 730 am. 09/05/21   Akula, Vijaya, MD  latanoprost  (XALATAN ) 0.005 % ophthalmic solution Place 1 drop into both eyes 2 (two) times daily. 08/04/22   [provider]  methocarbamol  (ROBAXIN ) 500 MG tablet Take 500 mg by mouth every 6 (six) hours as needed for muscle spasms. 06/21/21   [provider]  ondansetron  (ZOFRAN ) 4 MG tablet Take 4 mg by mouth every 8 (eight) hours as needed for nausea or vomiting. 07/04/22   [provider]  ondansetron  (ZOFRAN -ODT) 4 MG disintegrating tablet Take 1 tablet (4 mg total) by mouth every 8 (eight) hours as needed. 11/04/23   Nancy Axon B, PA-C  promethazine -dextromethorphan  (PROMETHAZINE -DM) 6.25-15 MG/5ML syrup Take 5 mLs by mouth 4 (four) times daily as needed. 11/04/23   Floydene Hy, PA-C  Respiratory Therapy Supplies (ADULT MASK LARGE) MISC See admin instructions. use with inhaler 10/17/21   [provider]  Spacer/Aero-Holding Chambers (AEROCHAMBER PLUS) inhaler Use with inhaler 10/15/21   Ethlyn Herd, MD  VICTOZA 18 MG/3ML SOPN Inject 0.6 mg into the skin daily.    [provider]  VOLTAREN  ARTHRITIS PAIN 1 % GEL Apply 2 g topically 4 (four) times daily. 03/22/22  [provider]    Allergies as of 01/07/2024 - Review Complete 01/07/2024  Allergen Reaction Noted   Penicillins Hives and Other (See Comments) 07/07/2015    Family History  Problem Relation Age of Onset   CAD Mother    CAD Father    Breast cancer Neg Hx     Social History   Socioeconomic History   Marital status: Married    Spouse name: Not on file   Number of children: Not on file   Years of education: Not on file   Highest  education level: Not on file  Occupational History   Not on file  Tobacco Use   Smoking status: Former    Current packs/day: 0.00    Average packs/day: 0.5 packs/day for 25.0 years (12.5 ttl pk-yrs)    Types: Cigarettes    Start date: 07/06/1989    Quit date: 07/06/2014    Years since quitting: 9.5   Smokeless tobacco: Never  Vaping Use   Vaping status: Never Used  Substance and Sexual Activity   Alcohol use: No    Alcohol/week: 0.0 standard drinks of alcohol   Drug use: No   Sexual activity: Never  Other Topics Concern   Not on file  Social History Narrative   Not on file   Social Drivers of Health   Financial Resource Strain: Not on file  Food Insecurity: No Food Insecurity (09/23/2022)   Hunger Vital Sign    Worried About Running Out of Food in the Last Year: Never true    Ran Out of Food in the Last Year: Never true  Transportation Needs: No Transportation Needs (09/23/2022)   PRAPARE - Administrator, Civil Service (Medical): No    Lack of Transportation (Non-Medical): No  Physical Activity: Not on file  Stress: Not on file  Social Connections: Not on file  Intimate Partner Violence: Not At Risk (09/23/2022)   Humiliation, Afraid, Rape, and Kick questionnaire    Fear of Current or Ex-Partner: No    Emotionally Abused: No    Physically Abused: No    Sexually Abused: No    Review of Systems: See HPI, otherwise negative ROS  Physical Exam: BP 138/82   Pulse 64   Temp (!) 96.1 F (35.6 C) (Temporal)   Resp 14   Ht 5\' 3"  (1.6 m)   Wt 74.8 kg   SpO2 99%   BMI 29.23 kg/m  General:   Alert,  pleasant and cooperative in NAD Head:  Normocephalic and atraumatic. Neck:  Supple; no masses or thyromegaly. Lungs:  Clear throughout to auscultation.    Heart:  Regular rate and rhythm. Abdomen:  Soft, nontender and nondistended. Normal bowel sounds, without guarding, and without rebound.   Neurologic:  Alert and  oriented x4;  grossly normal  neurologically.  Impression/Plan: Katherine Carter is now here to undergo a screening colonoscopy.  Risks, benefits, and alternatives regarding colonoscopy have been reviewed with the patient.  Questions have been answered.  All parties agreeable.

## 2024-01-29 NOTE — Anesthesia Preprocedure Evaluation (Signed)
 Anesthesia Evaluation  Patient identified by MRN, date of birth, ID band Patient awake    Reviewed: Allergy & Precautions, H&P , NPO status , Patient's Chart, lab work & pertinent test results, reviewed documented beta blocker date and time   History of Anesthesia Complications Negative for: history of anesthetic complications  Airway Mallampati: III  TM Distance: >3 FB Neck ROM: full    Dental  (+) Dental Advidsory Given, Edentulous Upper, Edentulous Lower   Pulmonary shortness of breath, with exertion and Long-Term Oxygen Therapy, neg sleep apnea, COPD,  COPD inhaler and oxygen dependent, neg recent URI, former smoker   Pulmonary exam normal breath sounds clear to auscultation       Cardiovascular Exercise Tolerance: Good hypertension, (-) angina (-) Past MI and (-) Cardiac Stents Normal cardiovascular exam(-) dysrhythmias (-) Valvular Problems/Murmurs Rhythm:regular Rate:Normal     Neuro/Psych negative neurological ROS  negative psych ROS   GI/Hepatic Neg liver ROS,GERD  ,,  Endo/Other  negative endocrine ROSdiabetes    Renal/GU negative Renal ROS  negative genitourinary   Musculoskeletal   Abdominal   Peds  Hematology negative hematology ROS (+)   Anesthesia Other Findings Past Medical History: No date: COPD (chronic obstructive pulmonary disease) (HCC) No date: Diabetes mellitus without complication (HCC) No date: GERD (gastroesophageal reflux disease) No date: Hypertension   Reproductive/Obstetrics negative OB ROS                             Anesthesia Physical Anesthesia Plan  ASA: 3  Anesthesia Plan: General   Post-op Pain Management:    Induction: Intravenous  PONV Risk Score and Plan: 3 and Propofol  infusion, TIVA and Treatment may vary due to age or medical condition  Airway Management Planned: Natural Airway and Nasal Cannula  Additional Equipment:   Intra-op  Plan:   Post-operative Plan:   Informed Consent: I have reviewed the patients History and Physical, chart, labs and discussed the procedure including the risks, benefits and alternatives for the proposed anesthesia with the patient or authorized representative who has indicated his/her understanding and acceptance.     Dental Advisory Given  Plan Discussed with: Anesthesiologist, CRNA and Surgeon  Anesthesia Plan Comments:         Anesthesia Quick Evaluation

## 2024-01-29 NOTE — Anesthesia Postprocedure Evaluation (Signed)
 Anesthesia Post Note  Patient: Katherine Carter  Procedure(s) Performed: COLONOSCOPY POLYPECTOMY, INTESTINE  Patient location during evaluation: Endoscopy Anesthesia Type: General Level of consciousness: awake and alert Pain management: pain level controlled Vital Signs Assessment: post-procedure vital signs reviewed and stable Respiratory status: spontaneous breathing, nonlabored ventilation, respiratory function stable and patient connected to nasal cannula oxygen Cardiovascular status: blood pressure returned to baseline and stable Postop Assessment: no apparent nausea or vomiting Anesthetic complications: no   No notable events documented.   Last Vitals:  Vitals:   01/29/24 0911 01/29/24 0921  BP: 113/89 (!) 142/89  Pulse:    Resp:    Temp: (!) 35.7 C   SpO2:      Last Pain:  Vitals:   01/29/24 0923  TempSrc:   PainSc: 0-No pain                 Vanice Genre

## 2024-01-29 NOTE — Transfer of Care (Signed)
 Immediate Anesthesia Transfer of Care Note  Patient: Katherine Carter  Procedure(s) Performed: COLONOSCOPY POLYPECTOMY, INTESTINE  Patient Location: Endoscopy Unit  Anesthesia Type:General  Level of Consciousness: awake, drowsy, and patient cooperative  Airway & Oxygen Therapy: Patient Spontanous Breathing  Post-op Assessment: Report given to RN, Post -op Vital signs reviewed and stable, and Patient moving all extremities X 4  Post vital signs: Reviewed and stable  Last Vitals:  Vitals Value Taken Time  BP 113/89 01/29/24 0911  Temp    Pulse 78 01/29/24 0911  Resp 21 01/29/24 0911  SpO2 98 % 01/29/24 0911  Vitals shown include unfiled device data.  Last Pain:  Vitals:   01/29/24 0752  TempSrc: Temporal  PainSc: 0-No pain         Complications: No notable events documented.

## 2024-01-29 NOTE — Op Note (Signed)
 [facility] Gastroenterology Patient Name: Katherine Carter Procedure Date: 01/29/2024 8:45 AM MRN: [id number] Account #: [id number] Date of Birth: 05-14-1951 Admit Type: Outpatient Age: 73 Room: [facility] ENDO ROOM 4 Gender: Female Note Status: Finalized Instrument Name: Charlyn Cooley [id number] Procedure:             Colonoscopy Indications:           Screening for colorectal malignant neoplasm Providers:             Marnee Sink MD, MD Referring MD:          Gerlene Koh. Aycock MD (Referring MD) Medicines:             Propofol  per Anesthesia Complications:         No immediate complications. Procedure:             Pre-Anesthesia Assessment:                        - Prior to the procedure, a History and Physical was                         performed, and patient medications and allergies were                         reviewed. The patient's tolerance of previous                         anesthesia was also reviewed. The risks and benefits                         of the procedure and the sedation options and risks                         were discussed with the patient. All questions were                         answered, and informed consent was obtained. Prior                         Anticoagulants: The patient has taken no anticoagulant                         or antiplatelet agents. ASA Grade Assessment: II - A                         patient with mild systemic disease. After reviewing                         the risks and benefits, the patient was deemed in                         satisfactory condition to undergo the procedure.                        After obtaining informed consent, the colonoscope was                         passed under direct vision. Throughout the procedure,  the patient's blood pressure, pulse, and oxygen                         saturations were monitored continuously. The                         Colonoscope was introduced  through the anus and                         advanced to the the cecum, identified by appendiceal                         orifice and ileocecal valve. The colonoscopy was                         performed without difficulty. The patient tolerated                         the procedure well. The quality of the bowel                         preparation was inadequate. Findings:      The perianal and digital rectal examinations were normal.      Two sessile polyps were found in the cecum. The polyps were 3 to 4 mm in       size. These polyps were removed with a cold snare. Resection and       retrieval were complete.      A moderate amount of stool was found in the entire colon, precluding       visualization. Impression:            - Preparation of the colon was inadequate.                        - Two 3 to 4 mm polyps in the cecum, removed with a                         cold snare. Resected and retrieved.                        - Stool in the entire examined colon. Recommendation:        - Discharge patient to home.                        - Resume previous diet.                        - Continue present medications.                        - Await pathology results.                        - Repeat colonoscopy because the bowel preparation was                         poor. Procedure Code(s):     --- Professional ---  14782, Colonoscopy, flexible; with removal of                         tumor(s), polyp(s), or other lesion(s) by snare                         technique Diagnosis Code(s):     --- Professional ---                        Z12.11, Encounter for screening for malignant neoplasm                         of colon                        D12.0, Benign neoplasm of cecum CPT copyright 2022 American Medical Association. All rights reserved. The codes documented in this report are preliminary and upon coder review may  be revised to meet current compliance  requirements. Marnee Sink MD, MD 01/29/2024 9:11:33 AM This report has been signed electronically. Number of Addenda: 0 Note Initiated On: 01/29/2024 8:45 AM Scope Withdrawal Time: 0 hours 5 minutes 48 seconds  Total Procedure Duration: 0 hours 9 minutes 43 seconds  Estimated Blood Loss:  Estimated blood loss: none.      [facility]

## 2024-01-29 NOTE — Anesthesia Procedure Notes (Signed)
 Procedure Name: MAC Date/Time: 01/29/2024 8:55 AM  Performed by: Noelia Batman, CRNAPre-anesthesia Checklist: Patient identified, Emergency Drugs available, Suction available and Patient being monitored Patient Re-evaluated:Patient Re-evaluated prior to induction Oxygen Delivery Method: Nasal cannula

## 2024-01-30 ENCOUNTER — Encounter: Payer: Self-pay | Admitting: Gastroenterology

## 2024-01-30 LAB — SURGICAL PATHOLOGY

## 2024-03-02 ENCOUNTER — Ambulatory Visit: Admission: EM | Admit: 2024-03-02 | Discharge: 2024-03-02

## 2024-03-02 ENCOUNTER — Other Ambulatory Visit: Payer: Self-pay

## 2024-03-02 ENCOUNTER — Emergency Department
Admission: EM | Admit: 2024-03-02 | Discharge: 2024-03-02 | Disposition: A | Discharge status: Home / Self Care | Attending: Emergency Medicine | Admitting: Emergency Medicine

## 2024-03-02 ENCOUNTER — Encounter: Payer: Self-pay | Admitting: Emergency Medicine

## 2024-03-02 ENCOUNTER — Emergency Department

## 2024-03-02 DIAGNOSIS — E119 Type 2 diabetes mellitus without complications: Secondary | ICD-10-CM | POA: Insufficient documentation

## 2024-03-02 DIAGNOSIS — R1032 Left lower quadrant pain: Secondary | ICD-10-CM | POA: Insufficient documentation

## 2024-03-02 DIAGNOSIS — J449 Chronic obstructive pulmonary disease, unspecified: Secondary | ICD-10-CM | POA: Insufficient documentation

## 2024-03-02 DIAGNOSIS — R197 Diarrhea, unspecified: Secondary | ICD-10-CM | POA: Insufficient documentation

## 2024-03-02 DIAGNOSIS — I10 Essential (primary) hypertension: Secondary | ICD-10-CM | POA: Insufficient documentation

## 2024-03-02 DIAGNOSIS — R1084 Generalized abdominal pain: Secondary | ICD-10-CM | POA: Diagnosis not present

## 2024-03-02 LAB — COMPREHENSIVE METABOLIC PANEL WITH GFR
ALT: 32 U/L (ref 0–44)
AST: 37 U/L (ref 15–41)
Albumin: 3.9 g/dL (ref 3.5–5.0)
Alkaline Phosphatase: 72 U/L (ref 38–126)
Anion gap: 11 (ref 5–15)
BUN: 24 mg/dL — ABNORMAL HIGH (ref 8–23)
CO2: 21 mmol/L — ABNORMAL LOW (ref 22–32)
Calcium: 9.6 mg/dL (ref 8.9–10.3)
Chloride: 105 mmol/L (ref 98–111)
Creatinine, Ser: 1.09 mg/dL — ABNORMAL HIGH (ref 0.44–1.00)
GFR, Estimated: 54 mL/min — ABNORMAL LOW (ref >60)
Glucose, Bld: 222 mg/dL — ABNORMAL HIGH (ref 70–99)
Potassium: 4 mmol/L (ref 3.5–5.1)
Sodium: 137 mmol/L (ref 135–145)
Total Bilirubin: 0.6 mg/dL (ref 0.0–1.2)
Total Protein: 7.5 g/dL (ref 6.5–8.1)

## 2024-03-02 LAB — URINALYSIS, ROUTINE W REFLEX MICROSCOPIC
Bacteria, UA: NONE SEEN
Bilirubin Urine: NEGATIVE
Glucose, UA: >=500 mg/dL — AB
Hgb urine dipstick: NEGATIVE
Ketones, ur: NEGATIVE mg/dL
Leukocytes,Ua: NEGATIVE
Nitrite: NEGATIVE
Protein, ur: NEGATIVE mg/dL
Specific Gravity, Urine: 1.025 (ref 1.005–1.030)
pH: 5 (ref 5.0–8.0)

## 2024-03-02 LAB — CBC
HCT: 46.6 % — ABNORMAL HIGH (ref 36.0–46.0)
Hemoglobin: 14.8 g/dL (ref 12.0–15.0)
MCH: 28.7 pg (ref 26.0–34.0)
MCHC: 31.8 g/dL (ref 30.0–36.0)
MCV: 90.3 fL (ref 80.0–100.0)
Platelets: 234 10*3/uL (ref 150–400)
RBC: 5.16 MIL/uL — ABNORMAL HIGH (ref 3.87–5.11)
RDW: 15.1 % (ref 11.5–15.5)
WBC: 6.8 10*3/uL (ref 4.0–10.5)
nRBC: 0 % (ref 0.0–0.2)

## 2024-03-02 LAB — LIPASE, BLOOD: Lipase: 62 U/L — ABNORMAL HIGH (ref 11–51)

## 2024-03-02 MED ORDER — SODIUM CHLORIDE 0.9 % IV BOLUS
500.0000 mL | Freq: Once | INTRAVENOUS | Status: AC
  Administered 2024-03-02: 500 mL via INTRAVENOUS

## 2024-03-02 MED ORDER — IOHEXOL 300 MG/ML  SOLN
100.0000 mL | Freq: Once | INTRAMUSCULAR | Status: AC | PRN
  Administered 2024-03-02: 100 mL via INTRAVENOUS

## 2024-03-02 NOTE — Discharge Instructions (Addendum)
 Please go to the emergency department at [facility] for evaluation of your abdominal pain and distention.  Please go now.

## 2024-03-02 NOTE — ED Notes (Addendum)
 Patient is being discharged from the Urgent Care and sent to the Emergency Department via POV . Per Venetia Motto, NP, patient is in need of higher level of care due to 10/10 Abdominal and h/o ileus. Patient is aware and verbalizes understanding of plan of care.  Vitals:   03/02/24 1526  Pulse: (!) 111  Resp: 16  Temp: 98.3 F (36.8 C)  SpO2: 94%

## 2024-03-02 NOTE — ED Provider Notes (Signed)
 [facility] Provider Note    Event Date/Time   First MD Initiated Contact with Patient 03/02/24 1613     (approximate)   History   Diarrhea and Abdominal Pain   HPI  Katherine Carter is a 73 y.o. female with a history of COPD, diabetes, GERD, hypertension, appendectomy, hysterectomy who presents with complaints of diarrhea over the last 3 days, she also complains of left lower quadrant abdominal pain.     Physical Exam   Triage Vital Signs: ED Triage Vitals  Encounter Vitals Group     BP 03/02/24 1610 131/76     Girls Systolic BP Percentile --      Girls Diastolic BP Percentile --      Boys Systolic BP Percentile --      Boys Diastolic BP Percentile --      Pulse Rate 03/02/24 1610 (!) 108     Resp 03/02/24 1610 20     Temp 03/02/24 1610 97.7 F (36.5 C)     Temp Source 03/02/24 1610 Oral     SpO2 03/02/24 1610 95 %     Weight 03/02/24 1611 77.1 kg (170 lb)     Height 03/02/24 1611 1.6 m (5' 3)     Head Circumference --      Peak Flow --      Pain Score 03/02/24 1609 10     Pain Loc --      Pain Education --      Exclude from Growth Chart --     Most recent vital signs: Vitals:   03/02/24 1730 03/02/24 1800  BP: 116/67 125/68  Pulse: 83 83  Resp:    Temp:    SpO2: 96% 97%     General: Awake, no distress.  CV:  Good peripheral perfusion.  Resp:  Normal effort.  Abd:  Mild distention.  Mild tenderness left lower quadrant, no CVA tenderness Other:     ED Results / Procedures / Treatments   Labs (all labs ordered are listed, but only abnormal results are displayed) Labs Reviewed  LIPASE, BLOOD - Abnormal; Notable for the following components:      Result Value   Lipase 62 (*)    All other components within normal limits  COMPREHENSIVE METABOLIC PANEL WITH GFR - Abnormal; Notable for the following components:   CO2 21 (*)    Glucose, Bld 222 (*)    BUN 24 (*)    Creatinine, Ser 1.09 (*)    GFR, Estimated 54 (*)     All other components within normal limits  CBC - Abnormal; Notable for the following components:   RBC 5.16 (*)    HCT 46.6 (*)    All other components within normal limits  URINALYSIS, ROUTINE W REFLEX MICROSCOPIC - Abnormal; Notable for the following components:   Color, Urine STRAW (*)    APPearance CLEAR (*)    Glucose, UA >=500 (*)    All other components within normal limits     EKG     RADIOLOGY CT abdomen pelvis viewed interpret by me, no evidence of diverticulitis, confirmed radiology    PROCEDURES:  Critical Care performed:   Procedures   MEDICATIONS ORDERED IN ED: Medications  sodium chloride  0.9 % bolus 500 mL (0 mLs Intravenous Stopped 03/02/24 1736)  iohexol  (OMNIPAQUE ) 300 MG/ML solution 100 mL (100 mLs Intravenous Contrast Given 03/02/24 1713)     IMPRESSION / MDM / ASSESSMENT AND PLAN / ED COURSE  I  reviewed the triage vital signs and the nursing notes. Patient's presentation is most consistent with acute presentation with potential threat to life or bodily function.  Patient presents with abdominal pain as detailed above with diarrhea.  No recent antibiotics.  No foul-smelling diarrhea.  Suspect colitis versus enteritis versus diverticulitis  Will treat with IV fluids, obtain CT abdomen pelvis, labs and reevaluate.  CT abdomen pelvis without acute abnormality.  Patient feeling much better after fluids, lab work is generally reassuring mildly elevated BUN/creatinine consistent with dehydration, the patient did receive IV fluids.  No indication for admission at this time, appropriate discharge at this time, return precautions discussed, she agrees with this plan        FINAL CLINICAL IMPRESSION(S) / ED DIAGNOSES   Final diagnoses:  Diarrhea, unspecified type  Left lower quadrant abdominal pain     Rx / DC Orders   ED Discharge Orders     None        Note:  This document was prepared using Dragon voice recognition software and may  include unintentional dictation errors.   Arlander Charleston, MD 03/02/24 [phone]

## 2024-03-02 NOTE — ED Triage Notes (Signed)
 To ED POV for bilateral lower abdominal pain (soreness), nausea, diarrhea, HA since 3 days. Went to UC, sent here. States about 5 episodes of diarrhea per day. Pt is ambulatory with steady gait. Pt states 10/10 pain but appears to be in NAD.

## 2024-03-02 NOTE — ED Triage Notes (Signed)
 Pt c/o nausea, diarrhea, and headache. Started about 3 days. Denies vomiting or fever. She states she some abdominal pain.

## 2024-03-02 NOTE — ED Provider Notes (Signed)
 [facility]    CSN: [id number] Arrival date & time: 03/02/24  1509      History   Chief Complaint Chief Complaint  Patient presents with   Nausea   Diarrhea    HPI Katherine Carter is a 73 y.o. female.   HPI  73 year old female with past medical history significant for COPD, hypertension, GERD, diabetes, acute kidney injury, and ileus presents for evaluation of abdominal pain, swelling, headache, nausea, and diarrhea that have been going on for the last 3 days.  She denies any vomiting or fever.  She describes the pain as a constant dull 10/10 pain.  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   Ileus (HCC) 09/24/2022   Acute on chronic respiratory failure with hypoxia (HCC) 09/21/2022   Pseudohyponatremia 06/06/2022   E-coli UTI    Uncontrolled type 2 diabetes mellitus with hyperglycemia, without long-term current use of insulin  (HCC) 06/05/2022   COVID-19 virus infection 06/05/2022   Hyperkalemia 06/04/2022   AKI (acute kidney injury) (HCC) 06/04/2022   Acute bilateral lower abdominal pain 06/04/2022   Pure hypercholesterolemia    Elevated troponin    Acute respiratory failure with hypoxemia (HCC) 09/02/2021   Sepsis due to pneumonia (HCC) 07/08/2018   CAP (community acquired pneumonia) 07/08/2018   HTN (hypertension) 07/08/2018   COPD with acute exacerbation (HCC) 07/08/2018   Acute respiratory failure with hypoxia and hypercapnia (HCC) 12/10/2015    Past Surgical History:  Procedure Laterality Date    ABDOMINAL HYSTERECTOMY     COLONOSCOPY N/A 01/29/2024   Procedure: COLONOSCOPY;  Surgeon: Jinny Carmine, MD;  Location: [facility] ENDOSCOPY;  Service: Endoscopy;  Laterality: N/A;   COLONOSCOPY WITH PROPOFOL  N/A 01/23/2017   Procedure: COLONOSCOPY WITH PROPOFOL ;  Surgeon: Gaylyn Gladis PENNER, MD;  Location: [facility] ENDOSCOPY;  Service: Endoscopy;  Laterality: N/A;   FOOT SURGERY     POLYPECTOMY  01/29/2024   Procedure: POLYPECTOMY, INTESTINE;  Surgeon: Jinny Carmine, MD;  Location: [facility] ENDOSCOPY;  Service: Endoscopy;;    OB History   No obstetric history on file.      Home Medications    Prior to Admission medications   Medication Sig Start Date End Date Taking? Authorizing Provider  albuterol  (VENTOLIN  HFA) 108 (90 Base) MCG/ACT inhaler Inhale 2 puffs into the lungs every 4 (four) hours as needed for wheezing or shortness of breath.   Yes [provider]  amLODipine  (NORVASC ) 5 MG tablet Take 1 tablet (5 mg total) by mouth daily. 09/05/21  Yes Akula, Vijaya, MD  aspirin  EC 81 MG tablet Take 81 mg by mouth daily. 03/15/22  Yes [provider]  budesonide -formoterol  (SYMBICORT) 160-4.5 MCG/ACT inhaler Inhale 2 puffs into the lungs 2 (two) times daily.   Yes [provider]  JARDIANCE 10 MG TABS tablet Take 10 mg by mouth daily. 09/07/21  Yes [provider]  metFORMIN  (GLUCOPHAGE -XR) 500 MG 24 hr tablet Take 500 mg by mouth daily with breakfast.   Yes [provider]  VICTOZA 18 MG/3ML SOPN Inject 0.6 mg into the skin daily.  Yes [provider]  XOPENEX  HFA 45 MCG/ACT inhaler Inhale 1 puff into the lungs 3 (three) times daily. 11/02/21  Yes [provider]  fluticasone  (FLONASE ) 50 MCG/ACT nasal spray Place 1 spray into both nostrils daily. 09/06/22   [provider]  ipratropium (ATROVENT ) 0.06 % nasal spray Place 2 sprays into both nostrils 4 (four) times daily. 11/04/23   Arvis Jolan NOVAK, PA-C  ipratropium-albuterol  (DUONEB) 0.5-2.5  (3) MG/3ML SOLN Take 3 mLs by nebulization every 4 (four) hours as needed. Patient taking differently: Take 3 mLs by nebulization every 4 (four) hours as needed. Last dose: 730 am. 09/05/21   Akula, Vijaya, MD  latanoprost  (XALATAN ) 0.005 % ophthalmic solution Place 1 drop into both eyes 2 (two) times daily. 08/04/22   [provider]  methocarbamol  (ROBAXIN ) 500 MG tablet Take 500 mg by mouth every 6 (six) hours as needed for muscle spasms. 06/21/21   [provider]  ondansetron  (ZOFRAN ) 4 MG tablet Take 4 mg by mouth every 8 (eight) hours as needed for nausea or vomiting. 07/04/22   [provider]  ondansetron  (ZOFRAN -ODT) 4 MG disintegrating tablet Take 1 tablet (4 mg total) by mouth every 8 (eight) hours as needed. 11/04/23   Arvis Jolan NOVAK, PA-C  promethazine -dextromethorphan  (PROMETHAZINE -DM) 6.25-15 MG/5ML syrup Take 5 mLs by mouth 4 (four) times daily as needed. 11/04/23   Arvis Jolan NOVAK, PA-C  Respiratory Therapy Supplies (ADULT MASK LARGE) MISC See admin instructions. use with inhaler 10/17/21   [provider]  Spacer/Aero-Holding Chambers (AEROCHAMBER PLUS) inhaler Use with inhaler 10/15/21   Van Knee, MD  VOLTAREN  ARTHRITIS PAIN 1 % GEL Apply 2 g topically 4 (four) times daily. 03/22/22   [provider]    Family History Family History  Problem Relation Age of Onset   CAD Mother    CAD Father    Breast cancer Neg Hx     Social History Social History   Tobacco Use   Smoking status: Former    Current packs/day: 0.00    Average packs/day: 0.5 packs/day for 25.0 years (12.5 ttl pk-yrs)    Types: Cigarettes    Start date: 07/06/1989    Quit date: 07/06/2014    Years since quitting: 9.6   Smokeless tobacco: Never  Vaping Use   Vaping status: Never Used  Substance Use Topics   Alcohol use: No    Alcohol/week: 0.0 standard drinks of alcohol   Drug use: No     Allergies   Penicillins   Review of Systems Review of Systems   Constitutional:  Negative for fever.  Gastrointestinal:  Positive for abdominal distention, abdominal pain, diarrhea and nausea. Negative for vomiting.     Physical Exam Triage Vital Signs ED Triage Vitals  Encounter Vitals Group     BP      Girls Systolic BP Percentile      Girls Diastolic BP Percentile      Boys Systolic BP Percentile      Boys Diastolic BP Percentile      Pulse      Resp      Temp      Temp src      SpO2      Weight      Height      Head Circumference      Peak Flow      Pain Score      Pain Loc      Pain Education  Exclude from Growth Chart    No data found.  Updated Vital Signs Pulse (!) 111   Temp 98.3 F (36.8 C) (Oral)   Resp 16   Ht 5' 3 (1.6 m)   Wt 164 lb 14.5 oz (74.8 kg)   SpO2 94%   BMI 29.21 kg/m   Visual Acuity Right Eye Distance:   Left Eye Distance:   Bilateral Distance:    Right Eye Near:   Left Eye Near:    Bilateral Near:     Physical Exam Vitals and nursing note reviewed.  Constitutional:      Appearance: Normal appearance. She is not ill-appearing.  HENT:     Head: Normocephalic and atraumatic.   Cardiovascular:     Rate and Rhythm: Normal rate and regular rhythm.     Pulses: Normal pulses.     Heart sounds: Normal heart sounds. No murmur heard.    No friction rub. No gallop.  Pulmonary:     Effort: Pulmonary effort is normal.     Breath sounds: Normal breath sounds. No wheezing, rhonchi or rales.  Abdominal:     General: There is distension.     Palpations: Abdomen is soft.     Tenderness: There is abdominal tenderness. There is no guarding or rebound.   Skin:    Capillary Refill: Capillary refill takes less than 2 seconds.   Neurological:     General: No focal deficit present.     Mental Status: She is alert and oriented to person, place, and time.      UC Treatments / Results  Labs (all labs ordered are listed, but only abnormal results are displayed) Labs Reviewed - No data to  display  EKG   Radiology No results found.  Procedures Procedures (including critical care time)  Medications Ordered in UC Medications - No data to display  Initial Impression / Assessment and Plan / UC Course  I have reviewed the triage vital signs and the nursing notes.  Pertinent labs & imaging results that were available during my care of the patient were reviewed by me and considered in my medical decision making (see chart for details).   Patient is a pleasant 73 year old female presenting for evaluation of 3 days with abdominal pain that is 10/10 and dull, nausea, diarrhea, and distention.  She does have a history of an ileus.  She also reports that she had a colonoscopy in May and when they did the colonoscopy her bowel was still full of stool.  Given her distention and history of ileus I am concerned that she has either another ileus or possibly is developing a small bowel obstruction.  I have advised her that she needs a CT scan of her abdomen and I have recommended that she go to the emergency department.  She has elected to go to [facility] via [facility]OV.   Final Clinical Impressions(s) / UC Diagnoses   Final diagnoses:  Generalized abdominal pain     Discharge Instructions      Please go to the emergency department at [facility] for evaluation of your abdominal pain and distention.  Please go now.     ED Prescriptions   None    PDMP not reviewed this encounter.   Bernardino Ditch, NP 03/02/24 1539

## 2024-04-15 ENCOUNTER — Ambulatory Visit
Admission: RE | Admit: 2024-04-15 | Discharge: 2024-04-15 | Disposition: A | Discharge status: Home / Self Care | Source: Ambulatory Visit | Attending: Family Medicine | Admitting: Family Medicine

## 2024-04-15 DIAGNOSIS — Z1231 Encounter for screening mammogram for malignant neoplasm of breast: Secondary | ICD-10-CM | POA: Insufficient documentation

## 2024-04-15 DIAGNOSIS — Z780 Asymptomatic menopausal state: Secondary | ICD-10-CM

## 2024-04-17 ENCOUNTER — Other Ambulatory Visit: Payer: Self-pay | Admitting: Specialist

## 2024-04-17 DIAGNOSIS — Z87891 Personal history of nicotine dependence: Secondary | ICD-10-CM

## 2024-04-17 DIAGNOSIS — J449 Chronic obstructive pulmonary disease, unspecified: Secondary | ICD-10-CM

## 2024-04-21 ENCOUNTER — Ambulatory Visit
Admission: RE | Admit: 2024-04-21 | Discharge: 2024-04-21 | Disposition: A | Discharge status: Home / Self Care | Source: Ambulatory Visit | Attending: Student | Admitting: Student

## 2024-04-21 ENCOUNTER — Ambulatory Visit
Admission: RE | Admit: 2024-04-21 | Discharge: 2024-04-21 | Disposition: A | Discharge status: Home / Self Care | Payer: Worker's Compensation | Source: Ambulatory Visit | Attending: Student | Admitting: Student

## 2024-04-21 ENCOUNTER — Other Ambulatory Visit: Payer: Self-pay | Admitting: Student

## 2024-04-21 DIAGNOSIS — W208XXA Other cause of strike by thrown, projected or falling object, initial encounter: Secondary | ICD-10-CM | POA: Diagnosis not present

## 2024-04-21 DIAGNOSIS — Y990 Civilian activity done for income or pay: Secondary | ICD-10-CM

## 2024-04-21 DIAGNOSIS — M79632 Pain in left forearm: Secondary | ICD-10-CM | POA: Diagnosis present

## 2024-05-27 ENCOUNTER — Ambulatory Visit
Admission: RE | Admit: 2024-05-27 | Discharge: 2024-05-27 | Disposition: A | Discharge status: Home / Self Care | Source: Ambulatory Visit | Attending: Specialist | Admitting: Specialist

## 2024-05-27 DIAGNOSIS — J449 Chronic obstructive pulmonary disease, unspecified: Secondary | ICD-10-CM | POA: Insufficient documentation

## 2024-05-27 DIAGNOSIS — Z87891 Personal history of nicotine dependence: Secondary | ICD-10-CM | POA: Insufficient documentation

## 2024-09-04 ENCOUNTER — Ambulatory Visit
Admission: EM | Admit: 2024-09-04 | Discharge: 2024-09-04 | Disposition: A | Discharge status: Home / Self Care | Attending: Family Medicine | Admitting: Family Medicine

## 2024-09-04 DIAGNOSIS — J441 Chronic obstructive pulmonary disease with (acute) exacerbation: Secondary | ICD-10-CM

## 2024-09-04 DIAGNOSIS — J069 Acute upper respiratory infection, unspecified: Secondary | ICD-10-CM | POA: Diagnosis not present

## 2024-09-04 DIAGNOSIS — R062 Wheezing: Secondary | ICD-10-CM | POA: Diagnosis not present

## 2024-09-04 LAB — POC SOFIA SARS ANTIGEN FIA: SARS Coronavirus 2 Ag: NEGATIVE

## 2024-09-04 LAB — POCT INFLUENZA A/B
Influenza A, POC: NEGATIVE
Influenza B, POC: NEGATIVE

## 2024-09-04 LAB — POCT RESPIRATORY SYNCYTIAL VIRUS: RSV Antigen, POC: NEGATIVE

## 2024-09-04 MED ORDER — IPRATROPIUM-ALBUTEROL 0.5-2.5 (3) MG/3ML IN SOLN
3.0000 mL | Freq: Once | RESPIRATORY_TRACT | Status: AC
  Administered 2024-09-04: 3 mL via RESPIRATORY_TRACT

## 2024-09-04 MED ORDER — PREDNISONE 20 MG PO TABS: 40.0000 mg | ORAL_TABLET | Freq: Every day | ORAL | 0 refills | Status: AC

## 2024-09-04 MED ORDER — PROMETHAZINE-DM 6.25-15 MG/5ML PO SYRP: 5.0000 mL | ORAL_SOLUTION | Freq: Three times a day (TID) | ORAL | 0 refills | Status: AC | PRN

## 2024-09-04 NOTE — ED Provider Notes (Signed)
 " [facility]    CSN: [id number] Arrival date & time: 09/04/24  1128      History   Chief Complaint Chief Complaint  Patient presents with   Cough    HPI Katherine Carter is a 74 y.o. female  presents for evaluation of URI symptoms for 1 days. Patient reports associated symptoms of productive cough with clear sputum, wheezing/shortness of breath, sore throat, headache, mild diarrhea. Denies N/V, fevers, ear pain, body aches.  Patient has history of COPD.  Used a nebulizer this morning.  No known sick contacts.  Pt has taken Mucinex  OTC for symptoms. Pt has no other concerns at this time.    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/25/2022   Ileus (HCC) 09/24/2022   Acute on chronic respiratory failure with hypoxia (HCC) 09/21/2022   Pseudohyponatremia 06/06/2022   E-coli UTI    Uncontrolled type 2 diabetes mellitus with hyperglycemia, without long-term current use of insulin  (HCC) 06/05/2022   COVID-19 virus infection 06/05/2022   Hyperkalemia 06/04/2022   AKI (acute kidney injury) 06/04/2022   Acute bilateral lower abdominal pain 06/04/2022   Pure hypercholesterolemia    Elevated troponin    Acute respiratory failure with hypoxemia (HCC) 09/02/2021   Sepsis due to pneumonia (HCC) 07/08/2018   CAP (community acquired pneumonia) 07/08/2018   HTN (hypertension) 07/08/2018   COPD with acute exacerbation (HCC) 07/08/2018   Acute respiratory failure with  hypoxia and hypercapnia (HCC) 12/10/2015    Past Surgical History:  Procedure Laterality Date   ABDOMINAL HYSTERECTOMY     APPENDECTOMY     CHOLECYSTECTOMY     COLONOSCOPY N/A 01/29/2024   Procedure: COLONOSCOPY;  Surgeon: Jinny Carmine, MD;  Location: [facility] ENDOSCOPY;  Service: Endoscopy;  Laterality: N/A;   COLONOSCOPY WITH PROPOFOL  N/A 01/23/2017   Procedure: COLONOSCOPY WITH PROPOFOL ;  Surgeon: Gaylyn Gladis PENNER, MD;  Location: [facility] ENDOSCOPY;  Service: Endoscopy;  Laterality: N/A;   FOOT SURGERY     POLYPECTOMY  01/29/2024   Procedure: POLYPECTOMY, INTESTINE;  Surgeon: Jinny Carmine, MD;  Location: [facility] ENDOSCOPY;  Service: Endoscopy;;    OB History   No obstetric history on file.      Home Medications    Prior to Admission medications  Medication Sig Start Date End Date Taking? Authorizing Provider  amLODipine  (NORVASC ) 5 MG tablet Take 1 tablet (5 mg total) by mouth daily. 09/05/21  Yes Akula, Vijaya, MD  aspirin  EC 81 MG tablet Take 81 mg by mouth daily. 03/15/22  Yes [provider]  JARDIANCE 10 MG TABS tablet Take 10 mg by mouth daily. 09/07/21  Yes [provider]  metFORMIN  (GLUCOPHAGE -XR) 500 MG 24 hr tablet Take 500 mg by mouth daily with breakfast.   Yes [provider]  predniSONE  (DELTASONE ) 20 MG tablet Take 2 tablets (40 mg total) by mouth daily with breakfast for 5 days. 09/04/24 09/09/24 Yes Margaruite Top, Jodi R, NP  promethazine -dextromethorphan  (PROMETHAZINE -DM) 6.25-15 MG/5ML syrup Take 5 mLs by mouth 3 (  three) times daily as needed for cough. 09/04/24  Yes Tristen Luce, Jodi R, NP  VICTOZA 18 MG/3ML SOPN Inject 0.6 mg into the skin daily.   Yes [provider]  albuterol  (VENTOLIN  HFA) 108 (90 Base) MCG/ACT inhaler Inhale 2 puffs into the lungs every 4 (four) hours as needed for wheezing or shortness of breath.    [provider]  budesonide -formoterol  (SYMBICORT) 160-4.5 MCG/ACT inhaler Inhale 2 puffs into the lungs 2 (two) times daily.     [provider]  fluticasone  (FLONASE ) 50 MCG/ACT nasal spray Place 1 spray into both nostrils daily. 09/06/22   [provider]  ipratropium (ATROVENT ) 0.06 % nasal spray Place 2 sprays into both nostrils 4 (four) times daily. 11/04/23   Arvis Jolan NOVAK, PA-C  ipratropium-albuterol  (DUONEB) 0.5-2.5 (3) MG/3ML SOLN Take 3 mLs by nebulization every 4 (four) hours as needed. Patient taking differently: Take 3 mLs by nebulization every 4 (four) hours as needed. Last dose: 730 am. 09/05/21   Akula, Vijaya, MD  latanoprost  (XALATAN ) 0.005 % ophthalmic solution Place 1 drop into both eyes 2 (two) times daily. 08/04/22   [provider]  methocarbamol  (ROBAXIN ) 500 MG tablet Take 500 mg by mouth every 6 (six) hours as needed for muscle spasms. 06/21/21   [provider]  ondansetron  (ZOFRAN ) 4 MG tablet Take 4 mg by mouth every 8 (eight) hours as needed for nausea or vomiting. 07/04/22   [provider]  ondansetron  (ZOFRAN -ODT) 4 MG disintegrating tablet Take 1 tablet (4 mg total) by mouth every 8 (eight) hours as needed. 11/04/23   Arvis Jolan NOVAK, PA-C  Respiratory Therapy Supplies (ADULT MASK LARGE) MISC See admin instructions. use with inhaler 10/17/21   [provider]  Spacer/Aero-Holding Chambers (AEROCHAMBER PLUS) inhaler Use with inhaler 10/15/21   Van Knee, MD  VOLTAREN  ARTHRITIS PAIN 1 % GEL Apply 2 g topically 4 (four) times daily. 03/22/22   [provider]  XOPENEX  HFA 45 MCG/ACT inhaler Inhale 1 puff into the lungs 3 (three) times daily. 11/02/21   [provider]    Family History Family History  Problem Relation Age of Onset   CAD Mother    CAD Father    Breast cancer Neg Hx     Social History Social History[1]   Allergies   Lisinopril  and Penicillins   Review of Systems Review of Systems  HENT:  Positive for congestion and sore throat.   Respiratory:  Positive for cough, shortness of breath and  wheezing.      Physical Exam Triage Vital Signs ED Triage Vitals  Encounter Vitals Group     BP 09/04/24 1237 118/73     Girls Systolic BP Percentile --      Girls Diastolic BP Percentile --      Boys Systolic BP Percentile --      Boys Diastolic BP Percentile --      Pulse Rate 09/04/24 1237 (!) 118     Resp 09/04/24 1237 18     Temp 09/04/24 1237 98.6 F (37 C)     Temp Source 09/04/24 1237 Oral     SpO2 09/04/24 1237 92 %     Weight 09/04/24 1236 163 lb (73.9 kg)     Height --      Head Circumference --      Peak Flow --      Pain Score 09/04/24 1236 0     Pain Loc --      Pain Education --  Exclude from Growth Chart --    No data found.  Updated Vital Signs BP 118/73 (BP Location: Right Arm)   Pulse (!) 118   Temp 98.6 F (37 C) (Oral)   Resp 18   Wt 163 lb (73.9 kg)   SpO2 92%   BMI 28.87 kg/m   Visual Acuity Right Eye Distance:   Left Eye Distance:   Bilateral Distance:    Right Eye Near:   Left Eye Near:    Bilateral Near:     Physical Exam Vitals and nursing note reviewed.  Constitutional:      General: She is not in acute distress.    Appearance: She is well-developed. She is not ill-appearing.  HENT:     Head: Normocephalic and atraumatic.     Right Ear: Tympanic membrane and ear canal normal.     Left Ear: Tympanic membrane and ear canal normal.     Nose: Congestion present.     Mouth/Throat:     Mouth: Mucous membranes are moist.     Pharynx: Oropharynx is clear. Uvula midline. No oropharyngeal exudate or posterior oropharyngeal erythema.     Tonsils: No tonsillar exudate or tonsillar abscesses.  Eyes:     Conjunctiva/sclera: Conjunctivae normal.     Pupils: Pupils are equal, round, and reactive to light.  Cardiovascular:     Rate and Rhythm: Normal rate and regular rhythm.     Heart sounds: Normal heart sounds.  Pulmonary:     Effort: Pulmonary effort is normal.     Breath sounds: Normal breath sounds. No rhonchi or rales.      Comments: Slight exp wheeze LLL Musculoskeletal:     Cervical back: Normal range of motion and neck supple.  Lymphadenopathy:     Cervical: No cervical adenopathy.  Skin:    General: Skin is warm and dry.  Neurological:     General: No focal deficit present.     Mental Status: She is alert and oriented to person, place, and time.  Psychiatric:        Mood and Affect: Mood normal.        Behavior: Behavior normal.      UC Treatments / Results  Labs (all labs ordered are listed, but only abnormal results are displayed) Labs Reviewed  POCT RESPIRATORY SYNCYTIAL VIRUS - Normal  POCT INFLUENZA A/B - Normal  POC SOFIA SARS ANTIGEN FIA - Normal    EKG   Radiology No results found.  Procedures Procedures (including critical care time)  Medications Ordered in UC Medications  ipratropium-albuterol  (DUONEB) 0.5-2.5 (3) MG/3ML nebulizer solution 3 mL (3 mLs Nebulization Given 09/04/24 1303)    Initial Impression / Assessment and Plan / UC Course  I have reviewed the triage vital signs and the nursing notes.  Pertinent labs & imaging results that were available during my care of the patient were reviewed by me and considered in my medical decision making (see chart for details).     Negative COVID and flu testing.  Wheezing resolved after nebulizer.  Discussed COPD/viral illness.  Do not feel antibiotics are indicated at this time, will do prednisone  for 5 days.  Promethazine  DM as needed for cough and show continue inhalers as prescribed.  Advise rest fluids and PCP follow-up 2 to 3 days for recheck.  ER precautions reviewed. Final Clinical Impressions(s) / UC Diagnoses   Final diagnoses:  Wheezing  Viral upper respiratory illness  COPD exacerbation [facility])     Discharge Instructions  He tested negative for COVID and flu.  Start prednisone  daily for 5 days to manage her COPD symptoms.  Continue your inhalers as prescribed.  You may take Promethazine  DM as needed for  cough.  This medication will make you drowsy.  Do not drink alcohol or drive on this medication.  Lots of rest and fluids and follow-up with your PCP in 2 to 3 days for recheck.  Please go to the ER for any worsening symptoms.  Hope you feel better soon!     ED Prescriptions     Medication Sig Dispense Auth. Provider   predniSONE  (DELTASONE ) 20 MG tablet Take 2 tablets (40 mg total) by mouth daily with breakfast for 5 days. 10 tablet Baird Polinski, Jodi R, NP   promethazine -dextromethorphan  (PROMETHAZINE -DM) 6.25-15 MG/5ML syrup Take 5 mLs by mouth 3 (three) times daily as needed for cough. 118 mL Sitlaly Gudiel, Jodi R, NP      PDMP not reviewed this encounter.     [1]  Social History Tobacco Use   Smoking status: Former    Current packs/day: 0.00    Average packs/day: 0.5 packs/day for 25.0 years (12.5 ttl pk-yrs)    Types: Cigarettes    Start date: 07/06/1989    Quit date: 07/06/2014    Years since quitting: 10.1   Smokeless tobacco: Never  Vaping Use   Vaping status: Never Used  Substance Use Topics   Alcohol use: No    Alcohol/week: 0.0 standard drinks of alcohol   Drug use: No     Loreda Myla SAUNDERS, NP 09/04/24 1314  "

## 2024-09-04 NOTE — Discharge Instructions (Addendum)
 He tested negative for COVID and flu.  Start prednisone  daily for 5 days to manage her COPD symptoms.  Continue your inhalers as prescribed.  You may take Promethazine  DM as needed for cough.  This medication will make you drowsy.  Do not drink alcohol or drive on this medication.  Lots of rest and fluids and follow-up with your PCP in 2 to 3 days for recheck.  Please go to the ER for any worsening symptoms.  Hope you feel better soon!

## 2024-09-04 NOTE — ED Triage Notes (Signed)
 Patient states that she started with a sore throat last night, cough headache
